# Patient Record
Sex: Female | Born: 1945
Health system: Southern US, Community
[De-identification: ages and names within clinical notes are randomized; demographics above are authoritative.]

## PROBLEM LIST (undated history)

## (undated) DIAGNOSIS — I959 Hypotension, unspecified: Secondary | ICD-10-CM

## (undated) DIAGNOSIS — F329 Major depressive disorder, single episode, unspecified: Secondary | ICD-10-CM

## (undated) DIAGNOSIS — R431 Parosmia: Secondary | ICD-10-CM

## (undated) DIAGNOSIS — G4730 Sleep apnea, unspecified: Secondary | ICD-10-CM

## (undated) DIAGNOSIS — M1990 Unspecified osteoarthritis, unspecified site: Secondary | ICD-10-CM

## (undated) DIAGNOSIS — T7840XA Allergy, unspecified, initial encounter: Secondary | ICD-10-CM

## (undated) DIAGNOSIS — H269 Unspecified cataract: Secondary | ICD-10-CM

## (undated) DIAGNOSIS — I82409 Acute embolism and thrombosis of unspecified deep veins of unspecified lower extremity: Secondary | ICD-10-CM

## (undated) DIAGNOSIS — T8859XA Other complications of anesthesia, initial encounter: Secondary | ICD-10-CM

## (undated) DIAGNOSIS — R569 Unspecified convulsions: Secondary | ICD-10-CM

## (undated) DIAGNOSIS — Z9989 Dependence on other enabling machines and devices: Secondary | ICD-10-CM

## (undated) DIAGNOSIS — E785 Hyperlipidemia, unspecified: Secondary | ICD-10-CM

## (undated) DIAGNOSIS — R0600 Dyspnea, unspecified: Secondary | ICD-10-CM

## (undated) DIAGNOSIS — B029 Zoster without complications: Secondary | ICD-10-CM

## (undated) DIAGNOSIS — F32A Depression, unspecified: Secondary | ICD-10-CM

## (undated) DIAGNOSIS — G4710 Hypersomnia, unspecified: Secondary | ICD-10-CM

## (undated) DIAGNOSIS — F419 Anxiety disorder, unspecified: Secondary | ICD-10-CM

## (undated) DIAGNOSIS — R51 Headache: Secondary | ICD-10-CM

## (undated) DIAGNOSIS — G4733 Obstructive sleep apnea (adult) (pediatric): Secondary | ICD-10-CM

## (undated) HISTORY — PX: TONSILLECTOMY: SUR1361

## (undated) HISTORY — DX: Hypersomnia, unspecified: G47.10

## (undated) HISTORY — DX: Anxiety disorder, unspecified: F41.9

## (undated) HISTORY — DX: Headache: R51

## (undated) HISTORY — DX: Allergy, unspecified, initial encounter: T78.40XA

## (undated) HISTORY — DX: Zoster without complications: B02.9

## (undated) HISTORY — DX: Obstructive sleep apnea (adult) (pediatric): Z99.89

## (undated) HISTORY — DX: Depression, unspecified: F32.A

## (undated) HISTORY — DX: Obstructive sleep apnea (adult) (pediatric): G47.33

## (undated) HISTORY — DX: Major depressive disorder, single episode, unspecified: F32.9

## (undated) HISTORY — PX: OTHER SURGICAL HISTORY: SHX169

## (undated) HISTORY — DX: Hyperlipidemia, unspecified: E78.5

## (undated) HISTORY — DX: Sleep apnea, unspecified: G47.30

## (undated) HISTORY — PX: CHOLECYSTECTOMY: SHX55

## (undated) HISTORY — PX: KNEE SURGERY: SHX244

## (undated) HISTORY — PX: APPENDECTOMY: SHX54

## (undated) HISTORY — DX: Unspecified convulsions: R56.9

## (undated) HISTORY — DX: Unspecified cataract: H26.9

---

## 1996-11-08 HISTORY — PX: BREAST EXCISIONAL BIOPSY: SUR124

## 1999-06-05 ENCOUNTER — Encounter: Payer: Self-pay | Admitting: Internal Medicine

## 1999-06-05 ENCOUNTER — Ambulatory Visit (HOSPITAL_COMMUNITY): Admission: RE | Admit: 1999-06-05 | Discharge: 1999-06-05 | Payer: Self-pay | Admitting: Internal Medicine

## 1999-12-04 ENCOUNTER — Other Ambulatory Visit: Admission: RE | Admit: 1999-12-04 | Discharge: 1999-12-04 | Payer: Self-pay | Admitting: Obstetrics and Gynecology

## 2000-11-15 ENCOUNTER — Other Ambulatory Visit: Admission: RE | Admit: 2000-11-15 | Discharge: 2000-11-15 | Payer: Self-pay | Admitting: Obstetrics and Gynecology

## 2003-04-04 ENCOUNTER — Encounter: Payer: Self-pay | Admitting: Internal Medicine

## 2003-04-04 ENCOUNTER — Encounter: Admission: RE | Admit: 2003-04-04 | Discharge: 2003-04-04 | Payer: Self-pay | Admitting: Internal Medicine

## 2003-06-03 ENCOUNTER — Observation Stay (HOSPITAL_COMMUNITY): Admission: AD | Admit: 2003-06-03 | Discharge: 2003-06-04 | Payer: Self-pay | Admitting: Internal Medicine

## 2003-06-18 ENCOUNTER — Inpatient Hospital Stay (HOSPITAL_COMMUNITY): Admission: EM | Admit: 2003-06-18 | Discharge: 2003-06-21 | Payer: Self-pay | Admitting: Emergency Medicine

## 2003-06-18 ENCOUNTER — Encounter: Payer: Self-pay | Admitting: *Deleted

## 2003-10-15 ENCOUNTER — Encounter: Admission: RE | Admit: 2003-10-15 | Discharge: 2003-10-15 | Payer: Self-pay | Admitting: Internal Medicine

## 2003-10-17 ENCOUNTER — Other Ambulatory Visit: Admission: RE | Admit: 2003-10-17 | Discharge: 2003-10-17 | Payer: Self-pay | Admitting: Family Medicine

## 2004-10-22 ENCOUNTER — Ambulatory Visit: Payer: Self-pay | Admitting: Internal Medicine

## 2004-10-27 ENCOUNTER — Ambulatory Visit: Payer: Self-pay | Admitting: Internal Medicine

## 2004-10-27 ENCOUNTER — Inpatient Hospital Stay (HOSPITAL_COMMUNITY): Admission: AD | Admit: 2004-10-27 | Discharge: 2004-10-29 | Payer: Self-pay | Admitting: Internal Medicine

## 2004-11-08 HISTORY — PX: COLONOSCOPY: SHX174

## 2004-11-18 ENCOUNTER — Ambulatory Visit: Payer: Self-pay | Admitting: Internal Medicine

## 2004-11-27 ENCOUNTER — Encounter: Admission: RE | Admit: 2004-11-27 | Discharge: 2004-11-27 | Payer: Self-pay | Admitting: Internal Medicine

## 2004-12-29 ENCOUNTER — Emergency Department (HOSPITAL_COMMUNITY): Admission: EM | Admit: 2004-12-29 | Discharge: 2004-12-29 | Payer: Self-pay | Admitting: Emergency Medicine

## 2004-12-31 ENCOUNTER — Ambulatory Visit: Payer: Self-pay | Admitting: Internal Medicine

## 2005-01-01 ENCOUNTER — Encounter: Admission: RE | Admit: 2005-01-01 | Discharge: 2005-01-01 | Payer: Self-pay | Admitting: Internal Medicine

## 2005-01-03 ENCOUNTER — Encounter: Admission: RE | Admit: 2005-01-03 | Discharge: 2005-01-03 | Payer: Self-pay | Admitting: Internal Medicine

## 2005-01-07 ENCOUNTER — Ambulatory Visit: Payer: Self-pay | Admitting: Cardiology

## 2005-01-11 ENCOUNTER — Ambulatory Visit: Payer: Self-pay | Admitting: Cardiology

## 2005-03-01 ENCOUNTER — Ambulatory Visit: Payer: Self-pay | Admitting: Internal Medicine

## 2005-03-08 ENCOUNTER — Ambulatory Visit: Payer: Self-pay | Admitting: Cardiology

## 2005-03-30 ENCOUNTER — Ambulatory Visit: Payer: Self-pay | Admitting: Internal Medicine

## 2005-03-30 ENCOUNTER — Inpatient Hospital Stay (HOSPITAL_COMMUNITY): Admission: AD | Admit: 2005-03-30 | Discharge: 2005-04-01 | Payer: Self-pay | Admitting: Internal Medicine

## 2005-04-09 ENCOUNTER — Ambulatory Visit: Payer: Self-pay | Admitting: Internal Medicine

## 2005-06-03 ENCOUNTER — Ambulatory Visit: Payer: Self-pay | Admitting: Internal Medicine

## 2005-07-01 ENCOUNTER — Ambulatory Visit: Payer: Self-pay | Admitting: Cardiology

## 2005-07-01 ENCOUNTER — Ambulatory Visit: Payer: Self-pay

## 2005-10-15 ENCOUNTER — Ambulatory Visit: Payer: Self-pay | Admitting: Internal Medicine

## 2005-12-02 ENCOUNTER — Ambulatory Visit: Payer: Self-pay | Admitting: Internal Medicine

## 2005-12-02 ENCOUNTER — Encounter: Admission: RE | Admit: 2005-12-02 | Discharge: 2005-12-02 | Payer: Self-pay | Admitting: Internal Medicine

## 2005-12-06 ENCOUNTER — Ambulatory Visit: Payer: Self-pay | Admitting: Internal Medicine

## 2005-12-20 ENCOUNTER — Ambulatory Visit: Payer: Self-pay | Admitting: *Deleted

## 2005-12-27 ENCOUNTER — Ambulatory Visit: Payer: Self-pay | Admitting: *Deleted

## 2006-02-28 ENCOUNTER — Ambulatory Visit: Payer: Self-pay | Admitting: Internal Medicine

## 2006-03-10 ENCOUNTER — Ambulatory Visit: Payer: Self-pay | Admitting: Internal Medicine

## 2006-08-18 ENCOUNTER — Ambulatory Visit: Payer: Self-pay | Admitting: Internal Medicine

## 2006-08-25 ENCOUNTER — Ambulatory Visit: Payer: Self-pay | Admitting: Internal Medicine

## 2006-09-12 ENCOUNTER — Ambulatory Visit: Payer: Self-pay | Admitting: Internal Medicine

## 2006-09-14 ENCOUNTER — Ambulatory Visit: Payer: Self-pay | Admitting: Internal Medicine

## 2006-12-15 ENCOUNTER — Ambulatory Visit: Payer: Self-pay | Admitting: Internal Medicine

## 2006-12-30 ENCOUNTER — Encounter: Admission: RE | Admit: 2006-12-30 | Discharge: 2006-12-30 | Payer: Self-pay | Admitting: Internal Medicine

## 2007-03-20 ENCOUNTER — Ambulatory Visit: Payer: Self-pay | Admitting: Internal Medicine

## 2007-03-20 LAB — CONVERTED CEMR LAB
AST: 16 units/L (ref 0–37)
Cholesterol: 220 mg/dL (ref 0–200)
Direct LDL: 148.8 mg/dL

## 2007-03-23 DIAGNOSIS — T7840XA Allergy, unspecified, initial encounter: Secondary | ICD-10-CM | POA: Insufficient documentation

## 2007-03-24 ENCOUNTER — Encounter: Payer: Self-pay | Admitting: Internal Medicine

## 2007-03-24 ENCOUNTER — Ambulatory Visit: Payer: Self-pay | Admitting: Internal Medicine

## 2007-06-22 ENCOUNTER — Ambulatory Visit: Payer: Self-pay | Admitting: Internal Medicine

## 2007-06-22 LAB — CONVERTED CEMR LAB
ALT: 22 units/L (ref 0–35)
BUN: 18 mg/dL (ref 6–23)
Creatinine, Ser: 0.8 mg/dL (ref 0.4–1.2)
HDL: 45.1 mg/dL (ref >39.0)
Total CK: 47 units/L (ref 7–177)
Triglycerides: 132 mg/dL (ref 0–149)

## 2007-06-29 ENCOUNTER — Ambulatory Visit: Payer: Self-pay | Admitting: Internal Medicine

## 2007-06-29 DIAGNOSIS — E785 Hyperlipidemia, unspecified: Secondary | ICD-10-CM | POA: Insufficient documentation

## 2007-06-29 DIAGNOSIS — T887XXA Unspecified adverse effect of drug or medicament, initial encounter: Secondary | ICD-10-CM

## 2007-06-29 DIAGNOSIS — E8881 Metabolic syndrome: Secondary | ICD-10-CM

## 2007-06-29 LAB — CONVERTED CEMR LAB: Cholesterol, target level: 200 mg/dL

## 2007-06-30 ENCOUNTER — Telehealth (INDEPENDENT_AMBULATORY_CARE_PROVIDER_SITE_OTHER): Payer: Self-pay | Admitting: *Deleted

## 2007-08-08 ENCOUNTER — Telehealth (INDEPENDENT_AMBULATORY_CARE_PROVIDER_SITE_OTHER): Payer: Self-pay | Admitting: *Deleted

## 2007-09-27 ENCOUNTER — Ambulatory Visit: Payer: Self-pay | Admitting: Internal Medicine

## 2008-01-03 ENCOUNTER — Ambulatory Visit: Payer: Self-pay | Admitting: Internal Medicine

## 2008-01-10 ENCOUNTER — Telehealth: Payer: Self-pay | Admitting: Internal Medicine

## 2008-01-10 ENCOUNTER — Encounter (INDEPENDENT_AMBULATORY_CARE_PROVIDER_SITE_OTHER): Payer: Self-pay | Admitting: *Deleted

## 2008-07-11 ENCOUNTER — Encounter: Admission: RE | Admit: 2008-07-11 | Discharge: 2008-07-11 | Payer: Self-pay | Admitting: Internal Medicine

## 2008-07-29 ENCOUNTER — Telehealth (INDEPENDENT_AMBULATORY_CARE_PROVIDER_SITE_OTHER): Payer: Self-pay | Admitting: *Deleted

## 2008-08-01 ENCOUNTER — Telehealth (INDEPENDENT_AMBULATORY_CARE_PROVIDER_SITE_OTHER): Payer: Self-pay | Admitting: *Deleted

## 2008-08-21 ENCOUNTER — Ambulatory Visit: Payer: Self-pay | Admitting: Internal Medicine

## 2008-10-08 ENCOUNTER — Encounter: Payer: Self-pay | Admitting: Internal Medicine

## 2009-03-11 ENCOUNTER — Telehealth (INDEPENDENT_AMBULATORY_CARE_PROVIDER_SITE_OTHER): Payer: Self-pay | Admitting: *Deleted

## 2009-03-26 ENCOUNTER — Encounter: Payer: Self-pay | Admitting: Internal Medicine

## 2009-04-04 ENCOUNTER — Ambulatory Visit: Payer: Self-pay | Admitting: Internal Medicine

## 2009-04-04 DIAGNOSIS — J45909 Unspecified asthma, uncomplicated: Secondary | ICD-10-CM

## 2009-04-04 DIAGNOSIS — J309 Allergic rhinitis, unspecified: Secondary | ICD-10-CM

## 2009-04-04 DIAGNOSIS — E1142 Type 2 diabetes mellitus with diabetic polyneuropathy: Secondary | ICD-10-CM

## 2009-04-04 DIAGNOSIS — K5730 Diverticulosis of large intestine without perforation or abscess without bleeding: Secondary | ICD-10-CM

## 2009-04-04 HISTORY — DX: Type 2 diabetes mellitus with diabetic polyneuropathy: E11.42

## 2009-04-04 HISTORY — DX: Allergic rhinitis, unspecified: J30.9

## 2009-04-04 HISTORY — DX: Unspecified asthma, uncomplicated: J45.909

## 2009-04-04 HISTORY — DX: Diverticulosis of large intestine without perforation or abscess without bleeding: K57.30

## 2009-05-14 ENCOUNTER — Ambulatory Visit: Payer: Self-pay | Admitting: Internal Medicine

## 2009-05-14 DIAGNOSIS — S90919A Unspecified superficial injury of unspecified ankle, initial encounter: Secondary | ICD-10-CM

## 2009-05-14 DIAGNOSIS — S70919A Unspecified superficial injury of unspecified hip, initial encounter: Secondary | ICD-10-CM | POA: Insufficient documentation

## 2009-05-14 DIAGNOSIS — S70929A Unspecified superficial injury of unspecified thigh, initial encounter: Secondary | ICD-10-CM

## 2009-05-14 DIAGNOSIS — S0180XA Unspecified open wound of other part of head, initial encounter: Secondary | ICD-10-CM | POA: Insufficient documentation

## 2009-05-14 DIAGNOSIS — S80929A Unspecified superficial injury of unspecified lower leg, initial encounter: Secondary | ICD-10-CM

## 2009-07-31 ENCOUNTER — Encounter: Admission: RE | Admit: 2009-07-31 | Discharge: 2009-07-31 | Payer: Self-pay | Admitting: Internal Medicine

## 2009-09-04 ENCOUNTER — Telehealth (INDEPENDENT_AMBULATORY_CARE_PROVIDER_SITE_OTHER): Payer: Self-pay | Admitting: *Deleted

## 2009-09-17 ENCOUNTER — Ambulatory Visit: Payer: Self-pay | Admitting: Internal Medicine

## 2009-11-05 ENCOUNTER — Telehealth: Payer: Self-pay | Admitting: Internal Medicine

## 2009-11-06 ENCOUNTER — Ambulatory Visit: Payer: Self-pay | Admitting: Family Medicine

## 2009-11-06 DIAGNOSIS — H8110 Benign paroxysmal vertigo, unspecified ear: Secondary | ICD-10-CM

## 2009-11-06 DIAGNOSIS — H9209 Otalgia, unspecified ear: Secondary | ICD-10-CM | POA: Insufficient documentation

## 2009-11-06 HISTORY — DX: Benign paroxysmal vertigo, unspecified ear: H81.10

## 2010-02-13 ENCOUNTER — Ambulatory Visit: Payer: Self-pay | Admitting: Internal Medicine

## 2010-02-13 DIAGNOSIS — R079 Chest pain, unspecified: Secondary | ICD-10-CM

## 2010-02-16 LAB — CONVERTED CEMR LAB
ALT: 24 units/L (ref 0–35)
AST: 19 units/L (ref 0–37)
Albumin: 4 g/dL (ref 3.5–5.2)
Alkaline Phosphatase: 62 units/L (ref 39–117)
Basophils Relative: 0.3 % (ref 0.0–3.0)
Bilirubin, Direct: 0.1 mg/dL (ref 0.0–0.3)
CO2: 31 meq/L (ref 19–32)
Calcium: 9.1 mg/dL (ref 8.4–10.5)
Direct LDL: 156.2 mg/dL
Eosinophils Relative: 3.3 % (ref 0.0–5.0)
GFR calc non Af Amer: 88.94 mL/min (ref >60)
Hemoglobin: 14.7 g/dL (ref 12.0–15.0)
Lymphocytes Relative: 29 % (ref 12.0–46.0)
MCHC: 34.1 g/dL (ref 30.0–36.0)
Monocytes Relative: 7.5 % (ref 3.0–12.0)
Neutro Abs: 5.7 10*3/uL (ref 1.4–7.7)
Neutrophils Relative %: 59.9 % (ref 43.0–77.0)
RBC: 4.68 M/uL (ref 3.87–5.11)
Sodium: 142 meq/L (ref 135–145)
Total CHOL/HDL Ratio: 4
Total Protein: 6.9 g/dL (ref 6.0–8.3)
Triglycerides: 142 mg/dL (ref 0.0–149.0)
VLDL: 28.4 mg/dL (ref 0.0–40.0)
WBC: 9.4 10*3/uL (ref 4.5–10.5)

## 2010-03-05 ENCOUNTER — Telehealth (INDEPENDENT_AMBULATORY_CARE_PROVIDER_SITE_OTHER): Payer: Self-pay | Admitting: *Deleted

## 2010-04-03 ENCOUNTER — Ambulatory Visit: Payer: Self-pay | Admitting: Internal Medicine

## 2010-04-03 DIAGNOSIS — M7660 Achilles tendinitis, unspecified leg: Secondary | ICD-10-CM | POA: Insufficient documentation

## 2010-04-03 DIAGNOSIS — M25579 Pain in unspecified ankle and joints of unspecified foot: Secondary | ICD-10-CM

## 2010-04-16 ENCOUNTER — Ambulatory Visit: Payer: Self-pay | Admitting: Internal Medicine

## 2010-04-16 DIAGNOSIS — J019 Acute sinusitis, unspecified: Secondary | ICD-10-CM | POA: Insufficient documentation

## 2010-04-28 ENCOUNTER — Encounter: Payer: Self-pay | Admitting: Internal Medicine

## 2010-09-02 ENCOUNTER — Encounter: Admission: RE | Admit: 2010-09-02 | Discharge: 2010-09-02 | Payer: Self-pay | Admitting: Internal Medicine

## 2010-09-02 LAB — HM MAMMOGRAPHY: HM Mammogram: NORMAL

## 2010-11-29 ENCOUNTER — Encounter: Payer: Self-pay | Admitting: Internal Medicine

## 2010-12-08 NOTE — Assessment & Plan Note (Signed)
Summary: cough.cbs   Vital Signs:  Patient profile:   65 year old female Weight:      239 pounds O2 Sat:      97 % Temp:     98.8 degrees F oral Pulse rate:   80 / minute Resp:     14 per minute BP sitting:   116 / 68  (left arm) Cuff size:   large  Vitals Entered By: Shonna Chock (April 16, 2010 1:40 PM) CC: Cough x 6 days, Patient took: Contac (Flu + Cold) Max Strength, Guaifensin 400mg , and Benabryl Comments REVIEWED MED LIST, PATIENT AGREED DOSE AND INSTRUCTION CORRECT    Primary Care Provider:  Nani Gasser, MD  CC:  Cough x 6 days, Patient took: Contac (Flu + Cold) Max Strength, Guaifensin 400mg , and and Benabryl.  History of Present Illness: Cough for 6 days; OTC meds(list reviewed) with minimal benefit. Yellow green from head & chest, > from head.PMH of asthma. Using MDI up to 1 puff  bid given to her by her daughter who has asthma. Her husband had RTI during same period.  Allergies: 1)  ! Pcn 2)  ! * Shellfish 3)  ! Biaxin 4)  ! * Norflex 5)  ! * Advair 6)  ! * Singulair 7)  ! Sulfa 8)  ! * Avelox 9)  ! * Ivp Dye 10)  ! Tylenol Extra Strength (Acetaminophen) 11)  * Fiorinol  Review of Systems General:  Denies chills, fever, and sweats. ENT:  Complains of nasal congestion, postnasal drainage, and sinus pressure; No frontal headache or facial pain. Resp:  Complains of shortness of breath and wheezing; denies chest pain with inspiration; Scant streaks of blood. Allergy:  Complains of itching eyes and sneezing.  Physical Exam  General:  in no acute distress; alert,appropriate and cooperative throughout examination Ears:  External ear exam shows no significant lesions or deformities.  Otoscopic examination reveals clear canals, tympanic membranes are intact bilaterally without bulging, retraction, inflammation or discharge. Hearing is grossly normal bilaterally. MINIMNAL erythema R TM Nose:  External nasal examination shows no deformity or inflammation. Nasal  mucosa are dry without lesions or exudates. Mouth:  Oral mucosa and oropharynx without lesions or exudates.  Teeth in good repair. Lungs:  Normal respiratory effort, chest expands symmetrically. Lungs are clear to auscultation, no crackles or wheezes but paroxysmal cough.. Cervical Nodes:  No lymphadenopathy noted Axillary Nodes:  No palpable lymphadenopathy   Impression & Recommendations:  Problem # 1:  SINUSITIS- ACUTE-NOS (ICD-461.9)  Her updated medication list for this problem includes:    Azithromycin 250 Mg Tabs (Azithromycin) .Marland Kitchen... As per pack  Problem # 2:  BRONCHITIS-ACUTE (ICD-466.0)  Clinically no significant  RAD  @ present despite PMH Her updated medication list for this problem includes:    Azithromycin 250 Mg Tabs (Azithromycin) .Marland Kitchen... As per pack    Advair Diskus 250-50 Mcg/dose Aepb (Fluticasone-salmeterol) .Marland Kitchen... 1 inhalation every 12 hrs ; gargle & sopit after use    Singulair 10 Mg Tabs (Montelukast sodium) .Marland Kitchen... 1 once daily  Orders: T-2 View CXR (71020TC)  Complete Medication List: 1)  Budeprion Sr 150 Mg Tb12 (Bupropion hcl) .Marland Kitchen.. 1 by mouth bid 2)  Multivitamins Tabs (Multiple vitamin) .... Once daily 3)  Alli  .Marland Kitchen.. 1 by mouth qd 4)  Janumet 50-500 Mg Tabs (Sitagliptin-metformin hcl) .... 1/2 by mouth once daily 5)  Crestor 10 Mg Tabs (rosuvastatin Calcium)  .Marland Kitchen.. 1 by mouth once daily 6)  Flax Seed Oil  1000 Mg Caps (Flaxseed (linseed)) .Marland Kitchen.. 1 by mouth once daily 7)  Vitamin D3 2000 Unit Caps (Cholecalciferol) .Marland Kitchen.. 1 by mouth once daily 8)  Claritin 10 Mg Tabs (Loratadine) .... Once daily 9)  Aspirin 81 Mg Tabs (Aspirin) .Marland Kitchen.. 1 by mouth once daily 10)  Tramadol Hcl 50 Mg Tabs (Tramadol hcl) .Marland Kitchen.. 1 every 6 hrs as needed pain 11)  Azithromycin 250 Mg Tabs (Azithromycin) .... As per pack 12)  Advair Diskus 250-50 Mcg/dose Aepb (Fluticasone-salmeterol) .Marland Kitchen.. 1 inhalation every 12 hrs ; gargle & sopit after use 13)  Singulair 10 Mg Tabs (Montelukast sodium) .Marland Kitchen.. 1  once daily  Patient Instructions: 1)  Use Ventolin HFA 1-2 puffs every 4 hrs as needed .Drink as much fluid as you can tolerate for the next few days. Prescriptions: SINGULAIR 10 MG TABS (MONTELUKAST SODIUM) 1 once daily  #30 x 5   Entered and Authorized by:   Marga Melnick MD   Signed by:   Marga Melnick MD on 04/16/2010   Method used:   Samples Given   RxID:   [id number] ADVAIR DISKUS 250-50 MCG/DOSE AEPB (FLUTICASONE-SALMETEROL) 1 inhalation every 12 hrs ; gargle & sopit after use  #1 x 0   Entered and Authorized by:   Marga Melnick MD   Signed by:   Marga Melnick MD on 04/16/2010   Method used:   Samples Given   RxID:   [id number] AZITHROMYCIN 250 MG TABS (AZITHROMYCIN) as per pack  #1 x o   Entered and Authorized by:   Marga Melnick MD   Signed by:   Marga Melnick MD on 04/16/2010   Method used:   Faxed to ...       [facility] (retail)       [address]       [place], [place]  [address]       Ph: [phone]       Fax: [phone]   RxID:   [id number]

## 2010-12-08 NOTE — Consult Note (Signed)
Summary: [facility]  [facility]   Imported By: Lanelle Bal 07/21/2010 08:41:16  _____________________________________________________________________  External Attachment:    Type:   Image     Comment:   External Document

## 2010-12-08 NOTE — Progress Notes (Signed)
Summary: Refill Request  Phone Note Refill Request Call back at [phone] juan Message from:  Patient  Refills Requested: Medication #1:  BUDEPRION SR 150 MG  TB12 1 by mouth bid patient wants rx for 90 day supply he is going to [facility] - call patient husband when ready  Initial call taken by: Okey Regal [place],  March 05, 2010 11:22 AM  Follow-up for Phone Call        Patient's husband is aware rx is ready for pick-up, I informed him Dr.Hopper is out of the office and I will have Dr.Lowne sign the rx, ok'd Follow-up by: Shonna Chock,  March 05, 2010 3:21 PM    Prescriptions: BUDEPRION SR 150 MG  TB12 (BUPROPION HCL) 1 by mouth bid  #180 x 3   Entered by:   Shonna Chock   Authorized by:   Loreen Freud DO   Signed by:   Shonna Chock on 03/05/2010   Method used:   Print then Give to Patient   RxID:   [id number]

## 2010-12-08 NOTE — Assessment & Plan Note (Signed)
Summary: ROA//[facility]///[facility]   Vital Signs:  Patient profile:   65 year old female Weight:      240 pounds Temp:     98.5 degrees F oral Pulse rate:   72 / minute Resp:     17 per minute BP sitting:   122 / 78  (left arm) Cuff size:   large  Vitals Entered By: Shonna Chock (Apr 03, 2010 3:55 PM) CC: Left Ankle concerns x 2-3 months, Lower Extremity Joint pain Comments REVIEWED MED LIST, PATIENT AGREED DOSE AND INSTRUCTION CORRECT    Primary Care Provider:  Nani Gasser, MD  CC:  Left Ankle concerns x 2-3 months and Lower Extremity Joint pain.  History of Present Illness:  Lower Extremity Joint Pain      This is a 65 year old woman who presents with Lower Extremity Joint pain for 2-3  months  intermittently.  She has had swelling in that ankle for years.  The patient reports swelling, stiffness for >1 hr, and weakness, but denies redness, giving away, locking, popping, and decreased ROM.  The pain is located in the right ankle.  The pain began gradually .  The pain is described as sharp, intermittent, and activity/use  related.  Evaluation to date has included plain X-rays & injection by a Podiatrist with temporary improvement.  NSAIDS also help for a period. The patient denies the following symptoms: fever, rash, photosensitivity, eye symptoms, diarrhea, and dysuria.    Allergies: 1)  ! Pcn 2)  ! * Shellfish 3)  ! Biaxin 4)  ! * Norflex 5)  ! * Advair 6)  ! * Singulair 7)  ! Sulfa 8)  ! * Avelox 9)  ! * Ivp Dye 10)  ! Tylenol Extra Strength (Acetaminophen) 11)  * Fiorinol  Review of Systems MS:  Complains of muscle aches; denies low back pain, mid back pain, cramps, and thoracic pain. Derm:  Denies lesion(s) and rash.  Physical Exam  General:  in no acute distress; alert,appropriate and cooperative throughout examination Eyes:  No corneal or conjunctival inflammation noted.PERRL Mouth:  Oral mucosa and oropharynx without lesions or exudates.  Teeth in good  repair.Upper partial  Pulses:  R and L dorsalis pedis and posterior tibial pulses are full and equal bilaterally Extremities:  No clubbing, cyanosis, edema, or deformity noted with normal full range of motion of all joints.  Subcutaneous  tissues inferior to  L lateral malleolus  are increased with slight tenderness  to palpation . Pain L Achilles tendon with dorsiflexion L foot. Negative Homan's Neurologic:  alert & oriented X3, strength normal in all extremities, gait  abnormal with , and DTRs symmetrical and normal.   Skin:  Scattered stasis dermatitis over legs  Cervical Nodes:  No lymphadenopathy noted Axillary Nodes:  No palpable lymphadenopathy Psych:  memory intact for recent and remote, normally interactive, and good eye contact.     Impression & Recommendations:  Problem # 1:  ANKLE PAIN, LEFT (ICD-719.47)  Soft tissue , not bone   Orders: Orthopedic Referral (Ortho)  Problem # 2:  ACHILLES TENDINITIS (ICD-726.71)  Orders: Orthopedic Referral (Ortho)  Complete Medication List: 1)  Budeprion Sr 150 Mg Tb12 (Bupropion hcl) .Marland Kitchen.. 1 by mouth bid 2)  Multivitamins Tabs (Multiple vitamin) .... Once daily 3)  Alli  .Marland Kitchen.. 1 by mouth qd 4)  Janumet 50-500 Mg Tabs (Sitagliptin-metformin hcl) .... 1/2 by mouth once daily 5)  Crestor 10 Mg Tabs (rosuvastatin Calcium)  .Marland Kitchen.. 1 by mouth once  daily 6)  Flax Seed Oil 1000 Mg Caps (Flaxseed (linseed)) .Marland Kitchen.. 1 by mouth once daily 7)  Vitamin D3 2000 Unit Caps (Cholecalciferol) .Marland Kitchen.. 1 by mouth once daily 8)  Claritin 10 Mg Tabs (Loratadine) .... Once daily 9)  Aspirin 81 Mg Tabs (Aspirin) .Marland Kitchen.. 1 by mouth once daily 10)  Tramadol Hcl 50 Mg Tabs (Tramadol hcl) .Marland Kitchen.. 1 every 6 hrs as needed pain  Patient Instructions: 1)  Tramadol 50 mg every 6 hrs as needed for pain. Do not take Glucosamine because of your shellfish allergy Prescriptions: TRAMADOL HCL 50 MG TABS (TRAMADOL HCL) 1 every 6 hrs as needed pain  #30 x 1   Entered and Authorized by:    Marga Melnick MD   Signed by:   Marga Melnick MD on 04/03/2010   Method used:   Print then Give to Patient   RxID:   [id number]

## 2010-12-08 NOTE — Assessment & Plan Note (Signed)
Summary: emp//pt will be fasting//lch   Vital Signs:  Patient profile:   65 year old female Height:      59 inches Weight:      243.4 pounds BMI:     49.34 O2 Sat:      97 % Temp:     98.8 degrees F oral Pulse rate:   72 / minute Resp:     16 per minute BP sitting:   122 / 80  (left arm) Cuff size:   large  Vitals Entered By: Shonna Chock (February 13, 2010 11:17 AM) CC: 1.) Yearly follow-up and fasting labs 2.)Lung concerns-left side pain x a couple of weeks Comments REVIEWED MED LIST, PATIENT AGREED DOSE AND INSTRUCTION CORRECT    Primary Care Provider:  Nani Gasser, MD  CC:  1.) Yearly follow-up and fasting labs 2.)Lung concerns-left side pain x a couple of weeks.  History of Present Illness: [place] is here for  a physical;preventive health measures reviewed & are  up to date. She has an acute issue of chest pain  intermittently  X 2 weeks ,w/o trigger.  The patient reports resting chest pain, shortness of breath, and dizziness, but denies nausea, vomiting, diaphoresis, light headedness, syncope, and indigestion.  The pain is described as intermittent and sharp.  The pain is located in the left posterior/inferior  chest and the pain does not radiate.  Episodes of chest pain last 2-5 minutes.  The pain is brought on or made worse by deep breathing and lying down.  The pain is relieved or improved with rest/ meditation  and  NSAIDs.Last episode was 02/12/2010.                                                                                               Diabetes  update:FBS 97-122; no 2 hr post meal checks..No hypoglycemia. Weight up 10#.Dr Margaretmary Bayley last seen late Fall 2010.   Allergies: 1)  ! Pcn 2)  ! * Shellfish 3)  ! Biaxin 4)  ! * Norflex 5)  ! * Advair 6)  ! * Singulair 7)  ! Sulfa 8)  ! * Avelox 9)  ! * Ivp Dye 10)  ! Tylenol Extra Strength (Acetaminophen) 11)  * Fiorinol  Past History:  Past Medical History: Hyperlipidemia Allergic rhinitis Diabetes  mellitus, type II Asthma Diverticulosis @ Colonoscopy  Past Surgical History: Catheterization 2004: negative ; Status Asthmaticus 2005; PNA (CAP) 2006 ; G4 P2 Appendectomy Cholecystectomy Tonsillectomy  Family History: M died of complications from burn Father:  stomach cancer ,CAD Siblings: sister  beast cancer ; P aunt DM; M aunt DM  Social History: Marital Status: Married Children: 2 Retired from [employer] Never Smoked Alcohol use-yes: wine 1 glass once daily  Regular exercise-no  Review of Systems General:  Complains of fatigue; denies chills, fever, and weight loss. Eyes:  Denies blurring, double vision, and vision loss-both eyes; Last exam 2010; no retinopathy. ENT:  Denies nasal congestion and sinus pressure; No purulence. CV:  Complains of difficulty breathing while lying down; denies leg cramps with exertion. Resp:  Denies cough, coughing up  blood, and sputum productive. Derm:  Denies poor wound healing. Neuro:  Complains of tingling; denies numbness; Tingling LUE. Endo:  Denies excessive hunger, excessive thirst, and excessive urination. Allergy:  Complains of itching eyes and sneezing; Claritin of benefit; off allergy shots.  Physical Exam  General:  in no acute distress; alert,appropriate and cooperative throughout examination; overweight-appearing.   Head:  Normocephalic and atraumatic without obvious abnormalities. No apparent alopecia or balding. Eyes:  No corneal or conjunctival inflammation noted.Perrla. Funduscopic exam benign, without hemorrhages, exudates or papilledema.  Ears:  External ear exam shows no significant lesions or deformities.  Otoscopic examination reveals clear canals, tympanic membranes are intact bilaterally without bulging, retraction, inflammation or discharge. Hearing is grossly normal bilaterally. Nose:  External nasal examination shows no deformity or inflammation. Nasal mucosa are pink and moist without lesions or exudates. Mouth:   Oral mucosa and oropharynx without lesions or exudates.  Upper partial Neck:  No deformities, masses, or tenderness noted. Lungs:  Normal respiratory effort, chest expands symmetrically. Lungs are clear to auscultation, no crackles or wheezes. No splinting; no pain to compression. Minor dry cough. Heart:  Normal rate and regular rhythm. S1 and S2 normal without gallop, murmur, click, rub .S4 Abdomen:  Bowel sounds positive,abdomen soft and non-tender without masses, organomegaly or hernias noted. Genitalia:  to see Gyn in [place] Msk:  No deformity or scoliosis noted of thoracic or lumbar spine.   Pulses:  R and L carotid,radial,dorsalis pedis and posterior tibial pulses are full and equal bilaterally Extremities:  No clubbing, cyanosis, edema. Neg Homan's. Good nail health Neurologic:  alert & oriented X3, sensation intact to light touch over feet, and DTRs symmetrical and normal.   Skin:  Intact without suspicious lesions or rashes Cervical Nodes:  No lymphadenopathy noted Axillary Nodes:  No palpable lymphadenopathy Psych:  memory intact for recent and remote, normally interactive, and good eye contact.     Impression & Recommendations:  Problem # 1:  PREVENTIVE HEALTH CARE (ICD-V70.0)  Orders: EKG w/ Interpretation ([address]) Venipuncture ([address]) TLB-Lipid Panel (80061-LIPID) TLB-BMP (Basic Metabolic Panel-BMET) (80048-METABOL) TLB-CBC Platelet - w/Differential (85025-CBCD) TLB-Hepatic/Liver Function Pnl (80076-HEPATIC) TLB-TSH (Thyroid Stimulating Hormone) (84443-TSH) TLB-A1C / Hgb A1C (Glycohemoglobin) (83036-A1C) TLB-Microalbumin/Creat Ratio, Urine (82043-MALB) T-2 View CXR (71020TC)  Problem # 2:  CHEST PAIN (ICD-786.50)  L posterior chest pain X 2 weeks  Orders: Venipuncture ([address]) T-2 View CXR (71020TC) T-D-Dimer Fibrin Derivatives Quantitive (09811-91478)  Problem # 3:  ASTHMA (ICD-493.90)  Stable  Orders: Venipuncture (29562)  Problem # 4:  DIABETES  MELLITUS, TYPE II (ICD-250.00)  Her updated medication list for this problem includes:    Janumet 50-500 Mg Tabs (Sitagliptin-metformin hcl) .Marland Kitchen... 1 by mouth once daily    Aspirin 81 Mg Tabs (Aspirin) .Marland Kitchen... 1 by mouth once daily  Orders: Venipuncture ([address]) TLB-BMP (Basic Metabolic Panel-BMET) (80048-METABOL) TLB-A1C / Hgb A1C (Glycohemoglobin) (83036-A1C) TLB-Microalbumin/Creat Ratio, Urine (82043-MALB)  Problem # 5:  HYPERLIPIDEMIA NEC/NOS (ICD-272.4)  Her updated medication list for this problem includes:    Crestor 5 Mg Tabs (Rosuvastatin calcium) .Marland Kitchen... 1 by mouth once daily  Orders: EKG w/ Interpretation (93000) Venipuncture (57846) TLB-Lipid Panel (80061-LIPID)  Complete Medication List: 1)  Budeprion Sr 150 Mg Tb12 (Bupropion hcl) .Marland Kitchen.. 1 by mouth bid 2)  Multivitamins Tabs (Multiple vitamin) .... Once daily 3)  Alli  .Marland Kitchen.. 1 by mouth qd 4)  Janumet 50-500 Mg Tabs (Sitagliptin-metformin hcl) .Marland Kitchen.. 1 by mouth once daily 5)  Crestor 5 Mg Tabs (Rosuvastatin calcium) .Marland Kitchen.. 1 by  mouth once daily 6)  Flax Seed Oil 1000 Mg Caps (Flaxseed (linseed)) .Marland Kitchen.. 1 by mouth once daily 7)  Vitamin D3 2000 Unit Caps (Cholecalciferol) .Marland Kitchen.. 1 by mouth once daily 8)  Claritin 10 Mg Tabs (Loratadine) .... Once daily 9)  Aspirin 81 Mg Tabs (Aspirin) .Marland Kitchen.. 1 by mouth once daily 10)  Tramadol Hcl 50 Mg Tabs (Tramadol hcl) .Marland Kitchen.. 1 every 6 hrs as needed pain 11)  Doxycycline Hyclate 100 Mg Caps (Doxycycline hyclate) .Marland Kitchen.. 1 two times a day x 2 days then 1 once daily  Patient Instructions: 1)  Consume LESS THAN 40 grams of sugar /day from High Fructose Corn  Syrup sources as discussed. Prescriptions: DOXYCYCLINE HYCLATE 100 MG CAPS (DOXYCYCLINE HYCLATE) 1 two times a day X 2 days then 1 once daily  #12 x 0   Entered and Authorized by:   Marga Melnick MD   Signed by:   Marga Melnick MD on 02/13/2010   Method used:   Faxed to ...       [facility] (retail)       [address]        [place], [place]  [address]       Ph: [phone]       Fax: [phone]   RxID:   [id number] TRAMADOL HCL 50 MG TABS (TRAMADOL HCL) 1 every 6 hrs as needed pain  #30 x 0   Entered and Authorized by:   Marga Melnick MD   Signed by:   Marga Melnick MD on 02/13/2010   Method used:   Faxed to ...       [facility] (retail)       [address]       [place], [place]  [address]       Ph: [phone]       Fax: [phone]   RxID:   [id number]
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
## 2011-03-26 NOTE — Discharge Summary (Signed)
NAME:  Nicole Mccann, Nicole Mccann NO.:  [id number]   MEDICAL RECORD NO.:  [id number]                   PATIENT TYPE:  INP   LOCATION:  3702                                 FACILITY:  [facility]   PHYSICIAN:  Cecil Cranker, M.D.             DATE OF BIRTH:  [birth year]   DATE OF ADMISSION:  06/18/2003  DATE OF DISCHARGE:  06/21/2003                           DISCHARGE SUMMARY - REFERRING   PROCEDURES:  1. Cardiac catheterization.  2. Coronary arteriogram.  3. Left ventriculogram.   HOSPITAL COURSE:  Ms. Keitt is a 65 year old female with no known  history of coronary artery disease.  She had a normal Persantine Cardiolite  in 2002 but saw her primary care physician for exertional chest pain and was  referred to cardiology.  She was evaluated in the office on June 18, 2003  and she had been having recurrent chest pressure associated with exertion.  Aspirin was added to her medication regimen but beta-blocker was deferred  secondary to a history of asthma.  It was felt that her symptoms were  consistent with unstable anginal pain and she was admitted from the office  for further evaluation and cardiac catheterization.   Ms. Hamler had a cardiac catheterization performed on June 20, 2003.  It showed normal coronary arteries and an EF of 61% without regional wall  motion abnormalities.  It was felt that she had a noncardiac etiology to her  chest pain.   Post procedure she complained of some anterior right lower extremity  numbness between the knee and the groin.  This gradually resolved but she  began complaining of right knee edema and some difficulty moving it.  This  is a knee which has given her problems secondary to arthritis for a long  period of time.  Her symptoms were not new or different and there was no  history of a fall or twisting motion.  Upon examination there was a moderate  amount of edema but no crepitus.  It was felt that observation  and pain  control was the best option.  She was kept overnight.   The next day her knee was much better and she had no chest pain or shortness  of breath.  The numbness in her leg had resolved and the edema in her knee  had also resolved.  She was ambulating without chest pain or shortness of  breath and her groin was without hematoma or ecchymosis.  She was considered  stable for discharge on June 21, 2003 with outpatient followup arranged.   LABORATORY VALUES:  Hemoglobin 14.2, hematocrit 42.0, WBCs 7.5, platelets  220.  Sodium 141, potassium 4.0, chloride 107, CO2 28, BUN 12, creatinine  0.8, glucose 111. Other C-MET values within normal limits.  Serial CK, MB  and troponin are negative for MI.  Total cholesterol 245, triglycerides 88,  HDL 55, LDL 172.  TSH 2.234.   DISCHARGE CONDITION:  Stable.  DISCHARGE DIAGNOSES:  1. Chest pain, no coronary artery disease by catheterization, followup with     the primary care physician and cardiology.  2. Diabetes.  Restart Glucophage on Sunday.  3. Mild hypertension.  Norvasc added to her medication regimen.  4. Asthma.  5. Osteoarthritis with right knee pain.  6. Transient numbness to the anterior aspect of her left thigh post     catheterization.  7. Hyperlipidemia.  8. Obesity.  9. Gastroesophageal reflux disease.   DISCHARGE INSTRUCTIONS:  1. Her activity level is to include no driving, sexual or strenuous activity     for two days.  2. She is to stick to a low-fat diabetic diet.  3. She is to call the office for problems with the cath site.  4. She is to followup with Dr. Alwyn Ren and call for an appointment.  5. She is to see the PA for Dr. Corinda Gubler on July 04, 2003 at 12:30.     Decision can be made at that time whether cardiology is to add statin to     her medication regimen or if this is to be left to her primary care     physician.   DISCHARGE MEDICATIONS:  1. Wellbutrin b.i.d.  2. Inhalers p.r.n.  3. Vitamins and  Benadryl as at home.  4. Metformin 500 mg, restart on June 23, 2003.  5. Norvasc 500 mg every day.  6. Singulair 10 mg every day.        Lavella Hammock, P.A. [facility]                  E. Graceann Congress, M.D.    RG/MEDQ  D:  06/21/2003  T:  06/21/2003  Job:  [id number]   cc:   Titus Dubin. Alwyn Ren, M.D. [facility]   Bea Laura Graceann Congress, M.D.

## 2011-03-26 NOTE — Discharge Summary (Signed)
NAME:  Nicole Mccann, Nicole Mccann NO.:  [id number]   MEDICAL RECORD NO.:  [id number]          PATIENT TYPE:  INP   LOCATION:  3714                         FACILITY:  [facility]   PHYSICIAN:  Rene Paci, M.D. LHCDATE OF BIRTH:  28-Jun-1946   DATE OF ADMISSION:  10/27/2004  DATE OF DISCHARGE:  10/29/2004                                 DISCHARGE SUMMARY   DIAGNOSES ON DISCHARGE:  1.  Acute exacerbation of asthma.  2.  Steroid-induced hyperglycemia.   BRIEF ADMISSION HISTORY:  Ms. Kimmet is a 65 year old female who  presented with status asthmaticus and left pleuritic chest pain.  The  patient was initially seen on October 22, 2004, with productive cough.  It  appears that she was started on Xopenex nebs, Azmacort MDI, and Levaquin 750  mg daily.  She called again on October 26, 2004, stating that she was not  any better.  At that time, prednisone was called in.  She was seen  emergently in the office on October 27, 2004, and Dr. Alwyn Ren felt that she  was probably in status asthmaticus.   PAST MEDICAL HISTORY:  1.  History of cardiac catheterization in 2004, revealing normal coronary      arteries with preserved EF and no wall motion abnormalities.  2.  Status post appendectomy.  3.  Status post cholecystectomy.  4.  Tonsillectomy.  5.  Asthma.  6.  Adult-onset diabetes mellitus.  7.  Dyslipidemia.  8.  Depression.   HOSPITAL COURSE:  Problem 1. Pulmonary.  The patient was admitted with  status asthmaticus and acute exacerbation of asthma.  She was admitted to  rule out pulmonary embolus.  CT of the chest showed no PE and some mild  bronchitic changes.  The patient was treated with oxygen.  The patient was  continued on nebulizers, steroids, and antibiotics.  The patient's condition  improved.  She was tapered back to oral antibiotics and oral steroids.  We  will have her complete a full 10-day course of antibiotics of which she has  already completed eight.  We  will also have her continue a steroid taper for  another week.   Problem 2. Hyperglycemia exacerbated by steroids.  The patient had a normal  hemoglobin A1c during this admission.   LABORATORIES ON DISCHARGE:  BNP was normal.  D-dimer 0.22.  White count  11.2, hemoglobin 14.9, hemoglobin A1c 5.9%.   DISCHARGE MEDICATIONS:  1.  Niacin 250 mg daily.  2.  Wellbutrin 300 mg daily.  3.  Levaquin 750 mg for two additional days.  4.  Prednisone 20 mg taper.  5.  Xopenex 1.25 mg nebulized q.6h. p.r.n.  6.  Glucophage 500 mg daily.   FOLLOWUP:  Follow up with Dr. Alwyn Ren.      Laur   LC/MEDQ  D:  10/29/2004  T:  10/30/2004  Job:  [id number]

## 2011-03-26 NOTE — H&P (Signed)
NAME:  Nicole Mccann, Nicole Mccann NO.:  [id number]   MEDICAL RECORD NO.:  [id number]          PATIENT TYPE:  INP   LOCATION:                               FACILITY:  [facility]   PHYSICIAN:  Titus Dubin. Alwyn Ren, M.D. [facility] OF BIRTH:  1946-01-08   DATE OF ADMISSION:  03/30/2005  DATE OF DISCHARGE:                                HISTORY & PHYSICAL   HISTORY OF PRESENT ILLNESS:  Nicole Mccann is a 65 year old white female  with extrinsic asthma, admitted with fever, shortness of breath, pleuritic  chest pain and radiographic suggestion of left lower lobe pneumonia.   She had a flare of her allergies over the weekend, Mar 27, 2005 and Mar 28, 2005.  On Mar 29, 2005, her temperature went to 100.4 and she developed a  cough with pleuritic pain.  She also had some associated dizziness and  weakness with scant hemoptysis.  She treated the allergic symptoms with  Benadryl with only partial resolution.  She began using her Ventolin metered-  dose inhaler up to 4 times a day.   In the office, when today seen emergently, she had wheezing and pleurisy.  Chest x-ray suggested possible pneumonia, prompting admission.   Her most recent hospitalization was in December 2005 for status asthmaticus.  She was seen in the emergency room in February of 2006 with vertigo.   PAST MEDICAL HISTORY:  Her past medical history includes.  1.  Four pregnancies and 2 deliveries.  2.  Appendectomy.  3.  Cholecystectomy.  4.  Tonsillectomy.   FAMILY HISTORY:  Family history includes angina in her father, diabetes in a  paternal aunt and maternal aunt, stomach cancer in paternal grandfather,  stomach cancer in maternal grandfather and stomach cancer in a grandmother.  Her mother had migraines.   ALLERGIES:  She is allergic or intolerant to PENICILLIN, SHELLFISH, ADVAIR,  FIORINAL, BIAXIN, NORVASC, SULFA, AVELOX.   MEDICATIONS:  She is presently on:  1.  Allergy injections.  2.  Multivitamins.  3.   Metformin 500 mg daily.  4.  Wellbutrin XR 300 mg daily.  5.  Vytorin 10/40.  6.  Ventolin as needed.   REVIEW OF SYSTEMS:  Review of systems includes pain in the right ear.  She  has also had numbness in her fifth toes bilaterally when she stands for a  period of time.  She has had some  yellow sputum with present illness.   PHYSICAL EXAMINATION:  GENERAL:  On exam, she has mild respiratory  compromise with wheezing.  VITAL SIGNS:  Temperature is 100.0, pulse 68, blood pressure 100/64.  Weight  is 230.  HEENT:  She has an upper dental plate.  Hypo-nasal speech is present.  Otolaryngologic exam is otherwise unremarkable.  LYMPHATICS:  She has no lymphadenopathy about the head, neck or axillae.  CARDIAC:  An S4 with a flow murmur is noted.  LUNGS:  She has diffuse wheezing without definite rub.  ABDOMEN:  Abdomen is massive and protuberant without tenderness.  EXTREMITIES:  Stasis dermatitis is noted over the shins.  Pedal pulses are  intact.  Homans sign  is negative.  NEUROLOGIC:  There are no definite localizing neurologic findings, although  she is anxious.   ASSESSMENT AND PLAN:  Chest x-ray is suggestive of left lower lobe  pneumonia.   She will be admitted to telemetry with aggressive pulmonary toilet and  intravenous antibiotics.  Lovenox coverage will be administered  prophylactically.       WFH/MEDQ  D:  03/31/2005  T:  03/31/2005  Job:  [id number]

## 2011-03-26 NOTE — H&P (Signed)
NAME:  KAHEALANI, YANKOVICH NO.:  [id number]   MEDICAL RECORD NO.:  [id number]          PATIENT TYPE:  INP   LOCATION:  3714                         FACILITY:  [facility]   PHYSICIAN:  Titus Dubin. Alwyn Ren, M.D. [facility] OF BIRTH:  1946/05/26   DATE OF ADMISSION:  10/27/2004  DATE OF DISCHARGE:                                HISTORY & PHYSICAL   HISTORY OF PRESENT ILLNESS:  Dhanya Bogle is a 65 year old female admitted  with status asthmaticus with left pleuritic chest pain.   She was seen on October 22, 2004 with cough productive of scant creamy  material.  She noticed diffuse chest tightness of a non-anginal type.  This  was associated with shortness of breath and cough.  She did not have  symptoms to suggest rhinosinusitis, although she did have some postnasal  drainage and some left upper dental pain.  She was having some fatigue.  The  symptoms were basically localized to the chest.   She was afebrile and had no lymphadenopathy.  Chest was clear after a single  treatment of Xopenex.  O2 SATS were 96% on room air.  Peak flow as 110 prior  to the nebulizer treatments.   She was placed on Xopenex 1.25 mg every 6-8 hours, along with Azmacort 2  puffs twice a day and Levaquin 750 mg.  She has a history of Advair causing  tachycardia; it was for this reason that Xopenex was initiated.   She called back on October 26, 2004, stating that she was still having  problems and was no better, having difficulty breathing or sleeping.  She  was dyspneic with minimal exertion.  Prednisone 20 mg twice a day for 5 days  then 1 daily was recommended.  She declined this, stating that she felt it  did not work.   She was seen emergently on October 27, 2004.  She has a little concern that  the Xopenex, which is sodium chloride and sulfuric acid, might be  exacerbating the sulfa allergy.  She was also describing pain in the left  inferior thorax posteriorly with deep breathing or  motion.  Although the  chest was clear at rest, with exertion and cough, she exhibited diffuse  wheezing.   She was hospitalized, as options are limited in view of her declining use of  oral steroids and her multiplicity of drug intolerances and allergies which  include penicillin, Fiorinal, shellfish, Biaxin, Norflex, Advair and  supposedly Singulair.  It is to be noted that although she questioned the  Singulair intolerance, she has taken it intermittently.  She had been using  her Ventolin in addition Xopenex, which was also a concern for the onset of  tachyarrhythmias in view of her past history.   She was last hospitalized in August of 2004 for catheterization.  This  revealed normal coronary arteries with an ejection fraction of 61%.  There  were no regional wall abnormalities.   She had been hospitalized prior to the elective catheterization on June 02, 2004 with chest pain.  She is a gravida 4, para 2.  She has had an  appendectomy, cholecystectomy and tonsillectomy.  She has an extrinsic  asthmatic condition for which she takes allergy injections weekly, which her  husband administers.   There is a history of angina in her father.  A paternal aunt and maternal  aunt have diabetes.  Paternal grandfather and maternal grandfather had  stomach cancer.   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 Jene Every, her  orthopedist.  He felt the condition was exacerbated by her obesity and use  of non-support footwear.  Intermittently, she has had vertigo-type symptoms  with normal tympanograms.   Intermittently, she has had headaches in the past.   She has been on Wellbutrin for depression.   Following the catheterization, she was motivated as far as diet and  exercise.  She demonstrated some weight loss as low as 215.  Her __________  profile in January 2005 had shown mildly elevated particle  number of 1578  with overall large size of 22.1, so that the particle number is 1578, LDL  size is 22.1.  She demonstrated a total cholesterol of 260 with an LDL of  179 at that time with an HDL of 56.  Hemoglobin A1c was 5.6%, indicating a  non-diabetic state, although she exhibited the stigmata of metabolic  syndrome.  In January of 2005, her total cholesterol was 236, based on a  repeat panel.  She had not returned for followup of her metabolic syndrome  since January, but had been seen for sinusitis-type symptoms.   When seen on October 27, 2004, she admitted that she had quit exercising  and dieting, and weight was now noted to be 234 pounds.   MEDICATIONS:  Medications are listed above.  Additionally, she has been on  glucosamine which could exacerbate her hyperlipidemia.  She has been on  calcium and minerals p.r.n.  She has also been on Metformin 500 mg daily,  Wellbutrin XR 300 mg daily.  She is on Estrace vaginal cream if needed.   PHYSICAL EXAMINATION:  GENERAL:  On exam, she is morbidly obese and  deconditioned.  She is 5 foot 3 and weighs 234 pounds.  Her affect was one  of marked anxiety.  VITAL SIGNS:  She was afebrile.  Pulse was 60 and regular, respiratory rate  19 and blood pressure 120/78.  HEENT:  Arteriolar narrowing was present on fundal examination.  She has an  upper partial.  There is mild edema of the nares and oropharynx without any  obstruction.  Otolaryngologic exam was otherwise unremarkable.  NECK:  Thyroid was normal to palpation.  LYMPHATICS:  She has no lymphadenopathy of the head, neck or axillae.  CHEST:  As stated, the chest was quiet, but she exhibited wheezing with any  motion.  CARDIAC:  S4 was present.  ABDOMEN:  Abdomen is massive.  There is an oblique operative scar in the  right upper quadrant.  EXTREMITIES:  Homans sign is negative.  Pedal pulses are intact and there is  no edema. NEUROLOGIC:  There are no localized neuropsychiatric  symptoms or signs  except for the anxiety.  MUSCULOSKELETAL:  She is markedly deconditioned, unable to sit up from a  supine position due to her body habitus without help.   PLAN:  She will be admitted to telemetry with a PTE protocol because of her  pleuritic pain and the asthmatic component.   Fasting lipid panel and hemoglobin A1c will also  be rechecked.  At the time  of discharge, she will be referred to the lipid clinic.  I discussed frankly  my concerns about her risk of heart attack or stroke, despite the normal  catheterization in 2004; normal would mean that she has less than 30%  blockage, that she has most of the risk  factors of metabolic syndrome which would predispose her to plaque  instability and high risk of cardiovascular event.  Metformin will be held  in the hospital and sliding-scale coverage with Regular insulin initiated.  Lorazepam will be provided for stress and Wellbutrin will be continued.       WFH/MEDQ  D:  10/28/2004  T:  10/28/2004  Job:  [id number]

## 2011-03-26 NOTE — Discharge Summary (Signed)
NAME:  Nicole Mccann, RULLO NO.:  [id number]   MEDICAL RECORD NO.:  [id number]          PATIENT TYPE:  INP   LOCATION:  6742                         FACILITY:  [facility]   PHYSICIAN:  Rene Paci, M.D. LHCDATE OF BIRTH:  03-24-1946   DATE OF ADMISSION:  03/30/2005  DATE OF DISCHARGE:  04/01/2005                                 DISCHARGE SUMMARY   DISCHARGE DIAGNOSES:  1. Community-acquired pneumonia.  2. Diabetes.  3. Hyperlipidemia.     HISTORY OF PRESENT ILLNESS:  The patient is a 65 year old female with a  history of asthma who presented with complaints of fever and shortness of  breath and chest pain.  A chest x-ray is performed at the [facility] office  which suggested a left lower lobe pneumonia.  The patient was admitted to  [facility] for further evaluation and IV antibiotics.   PAST MEDICAL HISTORY:  1. Diabetes.  2. Asthma.  3. Vertigo.  4. Dyslipidemia.  5. Depression.     COURSE OF HOSPITALIZATION:  The patient was admitted on Mar 30, 2005.  The  patient was placed on IV Zithromax and IV Rocephin.  In addition, the  patient was placed on albuterol nebulizer.  The patient continued to improve  during hospitalization.  The patient's blood sugars were  monitored during  hospitalization and remained in good control.   The patient clinically improved at time of discharge.  The patient remains  afebrile with a temperature at time of discharge of 97.8.  Breathing is  even, nonlabored.  The patient did have positive expiratory wheeze and was  instructed to use nebulizer q.6h. at home.  The patient will be discharged  to home on:  1. Ceftin 500 mg p.o. twice daily for an additional seven days.  2. Vytorin 10/40 at bedtime.  3. Wellbutrin XR 300 mg  daily.  4. Albuterol nebulizer q.6h.     LABORATORIES AT DISCHARGE:  Hemoglobin 14, hematocrit 41.4, white count 8.1.   FOLLOW UP:  The patient is to follow up with Dr. Alwyn Ren.  The appointment  was made for the patient on April 09, 2005 at 9:30 a.m.      MSO/MEDQ  D:  04/01/2005  T:  04/01/2005  Job:  [id number]   cc:   Titus Dubin. Alwyn Ren, M.D. [facility]

## 2011-03-26 NOTE — Cardiovascular Report (Signed)
   NAME:  Nicole Mccann Mccann, Nicole Mccann NO.:  [id number]   MEDICAL RECORD NO.:  [id number]                   PATIENT TYPE:  INP   LOCATION:  3702                                 FACILITY:  [facility]   PHYSICIAN:  Salvadore Farber, M.D.             DATE OF BIRTH:  June 08, 1946   DATE OF PROCEDURE:  06/20/2003  DATE OF DISCHARGE:                              CARDIAC CATHETERIZATION   PROCEDURE:  Left heart catheterization, left ventriculography, coronary  angiography.   INDICATIONS:  [name] is a 65 year old lady with morbid obesity who  presents with chest pain occurring with minimal exertion, but not at rest  over the past several weeks.  She was hospitalized and ruled out for  myocardial infarction and referred for diagnostic angiography.   PROCEDURAL TECHNIQUE:  Informed consent was obtained.  Under 1% lidocaine  local anesthesia a 6-French sheath was placed in the right femoral artery  using the modified Seldinger technique.  Diagnostic angiography and  ventriculography were performed using JL4, JR4, and pigtail catheters.  The  patient tolerated the procedure well and was transferred to the holding room  in stable condition.  Sheaths are to be removed there.   COMPLICATIONS:  None.   FINDINGS:  1. LV 137/13/22.  EF 61% without regional wall motion abnormality.  2. No aortic stenosis or mitral regurgitation.  3. Left main:  Angiographically normal.  4. LAD:  Small vessel which does not reach the apex of the heart.  Gives     rise to three small diagonal branches.  It is angiographically normal.  5. Circumflex:  Moderate sized vessel giving rise to two obtuse marginals.     It is angiographically normal.  6. RCA:  Very large, dominant vessel.  It is angiographically normal.   IMPRESSION/RECOMMENDATIONS:  1. Angiographically normal coronary arteries.  2. Normal left ventricular size and systolic function.  3. No aortic stenosis or mitral regurgitation.   Suspect noncardiac etiology to her chest discomfort.  The patient will  follow up with Titus Dubin. Alwyn Ren, M.D. [facility] for further evaluation.                                               Salvadore Farber, M.D.    WED/MEDQ  D:  06/20/2003  T:  06/20/2003  Job:  [id number]   cc:   Titus Dubin. Alwyn Ren, M.D. [facility]

## 2011-03-26 NOTE — Discharge Summary (Signed)
NAME:  [name], [name] NO.:  [id number]   MEDICAL RECORD NO.:  [id number]                   PATIENT TYPE:  INP   LOCATION:  3702                                 FACILITY:  [facility]   PHYSICIAN:  Nicole Mccann, M.D.             DATE OF BIRTH:  October 24, 1946   DATE OF ADMISSION:  06/18/2003  DATE OF DISCHARGE:  06/20/2003                           DISCHARGE SUMMARY - REFERRING   PROCEDURES:  1. Cardiac catheterization.  2. Coronary arteriogram.  3. Left ventriculogram.   HOSPITAL COURSE:  [name] is a 65 year old female with a history of  chest pain with admission in July 2004 where myocardial infarction was ruled  out.  It was felt at that time that her symptoms were likely secondary to  bronchospasm.  She was seen in the office on 06/18/2003, after being referred  by Primary Care.  She was having new onset exertional chest discomfort.  Because of that, it was felt that she needed hospital admission for further  evaluation.   Her symptoms were reviewed as well as her risk factors, and it was felt that  cardiac catheterization was the best evaluation.  Cardiac catheterization  was performed on 06/20/2003.  The cardiac catheterization showed normal  coronary arteries and an EF of 61% without regional wall motion  abnormalities.  Dr. Samule Ohm reviewed the films and felt she had  angiographically normal coronaries and a normal left ventricular size and  systolic function.  He felt that there was a noncardiac etiology to her  chest pain.   [name]'s bed rest is pending completion, but once her bed rest is  completed, if she is ambulating without difficulty, she is considered stable  for discharge on 06/20/2003.   LABORATORY DATA:  Hemoglobin 14.2, hematocrit 42.0, WBC 7.5, platelets 220.  Sodium 141, potassium 4.0, chloride 107, CO2 28, BUN 12, creatinine 0.8,  glucose 111.  Other CMET values within normal limits.  Serial CK, MB, and  troponin  I negative for MI.  Total cholesterol 245, triglycerides 88, HDL  55, LDL 172.  TSH 2.234.   Chest x-ray:  No active disease.  Heart and lungs were normal for portable  technique.   CONDITION ON DISCHARGE:  Stable.   DISCHARGE DIAGNOSES:  1. Chest pain.  No coronary artery disease by catheterization with preserved     left ventricular function.  2. Diabetes mellitus.  3. Status post cholecystectomy and appendectomy.  4. Longstanding history of asthma.  5. History of LACTOSE intolerance.  6. Occasional reflux symptoms.  7. Hyperlipidemia.  8. Obesity.  9. History of SHELLFISH allergy.   DISCHARGE INSTRUCTIONS:  1. Her activity level is to include no driving, sexual or strenuous activity     for two days.  2. She is to call the office for problems with the catheterization site.  3. She is to stick to a low-fat, diabetic diet.  4. She is to follow  up with the P.A[facility] on August 26 at 12:30 p.m.   DISCHARGE MEDICATIONS:  1. Wellbutrin b.i.d.  2. Singulair 10 mg q.h.s.  3. Allergy injection weekly.  4. Vitamin E 400 international units daily.  5. Glucosamine, gingko biloba, and multivitamins as prior to admission.  6. Inhalers p.r.n.  7. Norvasc 5 mg p.o. daily.  8. Pepcid 20 mg p.o.      Nicole Mccann, P.A. [facility]                  E. Graceann Congress, M.D.    RG/MEDQ  D:  06/20/2003  T:  06/20/2003  Job:  [id number]   cc:   Titus Dubin. Alwyn Ren, M.D. [facility]

## 2011-03-26 NOTE — H&P (Signed)
NAME:  Nicole Mccann, Nicole Mccann                   ACCOUNT NO.:  [id number]   MEDICAL RECORD NO.:  [id number]                   PATIENT TYPE:  INP   LOCATION:  4705                                 FACILITY:  [facility]   PHYSICIAN:  Titus Dubin. Alwyn Ren, M.D. [facility]         DATE OF BIRTH:  [birth year]   DATE OF ADMISSION:  06/03/2003  DATE OF DISCHARGE:                                HISTORY & PHYSICAL   HISTORY OF PRESENT ILLNESS:  Nicole Mccann is a 65 year old white female  admitted with chest and arm pain.   She was seen emergently after developing shortness of breath and  subsequently developing chest pain.  Approximately two hours prior to the  emergent office visit, she had been using Sevin spray for beetles on her  ornamental plants outside the home.  She developed shortness of breath and  stopped these activities.  After showering and eating, she developed pain in  the left arm and dizziness.  Subsequently, she noted substernal chest pain  with respirations.  After using the spray she did have a loose bowel  movement.   In the office, breath sounds were decreased but there was no wheezing.  She  was treated with a nebulized Xopenex 1.25 mg.  There was improvement in her  shortness of breath and chest pain but she continued to have left upper  extremity paresthesias prompting transport to the hospital for admission.  After EMT arrived, she described recurrent pain.   ALLERGIES:  1. PENICILLIN.  2. Benny Lennert.   MEDICATIONS:  She is on multivitamins, glucosamine sulfate, Ginkgo biloba  extract, calcium and vitamin D,  Wellbutrin XL 150 mg two daily.  She uses  Ventolin metered dose inhaler as needed.   PAST MEDICAL HISTORY:  1. Four pregnancies and two deliveries.  2. She has had an appendectomy, cholecystectomy, tonsillectomy.  3. She has had asthma which has an extrinsic component.  She is on allergy     injections weekly which her husband administers.  Her asthma has been  relatively quiet in the last several years.   FAMILY HISTORY:  Her father had angina.  Paternal aunt and maternal aunt had  diabetes.  Paternal grandfather and maternal grandfather and one grandmother  had stomach cancer.   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  She was seen by Dr. Jene Every in  June of this year for this.  He felt that this was exacerbated by her  obesity and use of a nonsupport shoe.   She also was evaluated in August of 2003, for otalgia, vertigo, and oral  fullness.  Normal middle ear function was found at tympanogram.   She was evaluated in January of this year for edema and headache.  She was  given Neurontin _____ mg every eight hours as needed for headache.   She was placed on Wellbutrin at that time for depression.   Obesity has  been a longstanding problem.  [employer] low carb program was  recommended.  Despite this, there has been only minimal weight loss.  She is  very sensitive to insect bites.  She was bitten by a mosquito the day of the  office visit as well as in the recent past.  She uses Benadryl as needed.   PHYSICAL EXAMINATION:  GENERAL APPEARANCE:  She sits in a wheelchair covered  in blankets with heating pad on her chest.  VITAL SIGNS:  Pulse was 68 and regular, respiratory rate 22 and blood  pressure 130/78.  HEENT:  Arteriola narrowing is noted on fundal examination.  She has an  upper partial.  The remainder of the otolaryngologic examination is  unremarkable.  LYMPHATICS:  There is no lymphadenopathy about the head, neck or axilla.  LUNGS:  Breath sounds are decreased but there is no wheezing.  CARDIOVASCULAR:  She has a slow S4 with no murmurs.  ABDOMEN:  Protuberant.  SKIN:  There is an urticarial lesion over the left forearm.  EXTREMITIES:  Pedal pulses are decreased.  She has no edema.  NEUROLOGIC:  Homan's sign is negative.  There are no localizing  neurologic  signs but examination is limited by her wheelchair bound state.  She is  extremely anxious and tearful.   EKG revealed inversion of T-wave in V1 through V3.   The O2 saturations were 98% on three liters following a nebulizer treatment.   PLAN:  She will be admitted to telemetry for observation with cardiac  enzymes.  The asthmatic bronchitis will be treated with nebulized  bronchodilators every four hours as needed.  It is most important to rule  out any acute myocardial injury possibly precipitated by the Sevin  inhalation induced bronchospasm.  She has positive family history of  coronary artery disease, and has risk factors of obesity and sedentary  lifestyle.  While hospitalized, lipids will be checked as well, in addition  to hemoglobin A1C as she is predisposed to the metabolic syndrome in view of  the above risk pattern.                                                Titus Dubin. Alwyn Ren, M.D. [facility]    [facility]/MEDQ  D:  06/04/2003  T:  06/04/2003  Job:  [id number]

## 2011-05-05 ENCOUNTER — Other Ambulatory Visit: Payer: Self-pay | Admitting: Internal Medicine

## 2011-05-05 MED ORDER — BUPROPION HCL ER (SR) 150 MG PO TB12: 150.0000 mg | ORAL_TABLET | Freq: Two times a day (BID) | ORAL | Status: DC

## 2011-05-05 NOTE — Telephone Encounter (Signed)
Patient wants rx for bupropion hcl sr-150 mg - twice daily - 90 day supply - patient wants to price shop - wants to pick up today call when ready

## 2011-07-07 ENCOUNTER — Ambulatory Visit (INDEPENDENT_AMBULATORY_CARE_PROVIDER_SITE_OTHER): Payer: 59 | Admitting: Internal Medicine

## 2011-07-07 ENCOUNTER — Encounter: Payer: Self-pay | Admitting: Internal Medicine

## 2011-07-07 DIAGNOSIS — J45909 Unspecified asthma, uncomplicated: Secondary | ICD-10-CM

## 2011-07-07 DIAGNOSIS — E119 Type 2 diabetes mellitus without complications: Secondary | ICD-10-CM

## 2011-07-07 DIAGNOSIS — Z0000 Encounter for general adult medical examination without abnormal findings: Secondary | ICD-10-CM

## 2011-07-07 DIAGNOSIS — E785 Hyperlipidemia, unspecified: Secondary | ICD-10-CM

## 2011-07-07 NOTE — Progress Notes (Signed)
Subjective:    Patient ID: Nicole Mccann, female    DOB: 1946-09-24, 65 y.o.   MRN: [id number]  HPI Medicare Wellness Visit:  The following psychosocial & medical history were reviewed as required by Medicare.   Social history: caffeine: minimal , alcohol:  1 glass wine/ day ,  tobacco use : never  & exercise : no.   Home & personal  safety / fall risk: occasionally with stairs, activities of daily living: no limitations , seatbelt use : yes , and smoke alarm employment : yes .  Power of Attorney/Living Will status : ?  Vision ( as recorded per Nurse) & Hearing  evaluation :  Wall chart read & whisper heard @ 6 ft. Orientation :oriented X 3 , memory & recall :good, spelling  testing: good,and mood & affect : normal . Depression / anxiety: depression intermittent, on meds Travel history : [place] 2011 , immunization status :Shingles needed , transfusion history:  no, and preventive health surveillance ( colonoscopies, BMD , etc as per protocol/ [facility]): colonoscopy due 2016; annual mammograms;Gyn care overdue.Dental care:  Sept 2011. Chart reviewed &  Updated. Active issues reviewed & addressed.       Review of Systems HYPERLIPIDEMIA: Chest pain, palpitations- no       Dyspnea- DOE due to weight & deconditioning Medications: Compliance- yes  Lightheadedness,Syncope- no    Edema- yes Abd pain, bowel changes- no   Muscle aches- yes, taking Acupuncture for back & R thigh pain    DIABETES: Disease Monitoring:Dr Clark , Endo sen 04/2011 Blood Sugar ranges-average 105  Polyuria/phagia/dipsia- only polyuria       Visual problems- no; last Ophth exam 01/12, no retinopathy Medications: Compliance- yes  Hypoglycemic symptoms- no  Chart review: last labs 02/2010         Objective:   Physical Exam Gen.: Weight excess. Alert, appropriate and cooperative throughout exam. Head: Normocephalic without obvious abnormalities Eyes: No corneal or conjunctival inflammation noted. Pupils  equal round reactive to light and accommodation. Fundal exam is benign without hemorrhages, exudate, papilledema. Extraocular motion intact. Vision grossly normal. Ears: External  ear exam reveals no significant lesions or deformities. Canals clear .TMs normal. Hearing is grossly normal bilaterally. Nose: External nasal exam reveals no deformity or inflammation. Nasal mucosa are pink and moist. No lesions or exudates noted. Mouth: Oral mucosa and oropharynx reveal no lesions or exudates. Teeth in good repair; upper partial. Neck: No deformities, masses, or tenderness noted. Range of motion & . Thyroid normal. Lungs: Normal respiratory effort; chest expands symmetrically. Lungs are clear to auscultation without rales, wheezes, or increased work of breathing. Heart: Normal rate and rhythm. Normal S1 and S2. No gallop, click, or rub. S4 w/o murmur. Abdomen: Protuberant.Bowel sounds normal; abdomen soft and nontender. No masses, organomegaly or hernias noted.  Genitalia: as per Gyn   .                                                                                   Musculoskeletal/extremities: Lordosis  noted of  the thoracic  spine. No clubbing, cyanosis, edema, or deformity noted. Tone & strength  normal.Joints normal. Nail health  Good.  Crepitus  of knees. Vascular: Carotid, radial artery, dorsalis pedis and  posterior tibial pulses are full and equal. No bruits present. Neurologic: Alert and oriented x3. Deep tendon reflexes symmetrical and normal.          Skin: Intact without suspicious lesions or rashes. Lymph: No cervical, axillary  lymphadenopathy present. Psych: Mood and affect are normal. Normally interactive                                                                                         Assessment & Plan:  #1 Medicare Wellness Exam; criteria met ; data entered #2 Problem List reviewed ; Assessment/ Recommendations made  #3 right thigh pain; this is probably radicular pain  from the lumbosacral spine. A CK level is recommended toevaluate any possible myalgia  #4 increased lordosis of the thoracic spine; this suggests possible osteoporosis. Vitamin D level should be checked. Vitamin D deficiency can also cause muscle pain. Plan: see Orders

## 2011-07-07 NOTE — Patient Instructions (Addendum)
Eat a low-fat diet with lots of fruits and vegetables, up to 7-9 servings per day. Avoid obesity; your goal is waist measurement < 40 inches.Consume less than 40 grams of sugar per day from foods & drinks with High Fructose Corn Sugar as #1,2,3 or # 4 on label. Follow the low carb nutrition program in [employer] as closely as possible to prevent Diabetes progression & complications. White carbohydrates (potatoes, rice, bread, and pasta) have a high spike of sugar and a high load of sugar. For example a  baked potato has a cup of sugar and a  french fry  2 teaspoons of sugar. Yams, wild  rice, whole grained bread &  wheat pasta have been much lower spike and load of  sugar. Portions should be the size of a deck of cards or your palm.  Please  schedule fasting Labs IF NOT DONE BY DR Chestine Spore : BMET,Lipids, hepatic panel, CBC & dif, TSH, A1c, urine microalbumin, CK, vitamin D level (250.00,272.4,995.20,729.1,733.00).

## 2011-07-26 ENCOUNTER — Other Ambulatory Visit: Payer: Self-pay | Admitting: Internal Medicine

## 2011-07-26 DIAGNOSIS — Z1231 Encounter for screening mammogram for malignant neoplasm of breast: Secondary | ICD-10-CM

## 2011-09-07 ENCOUNTER — Ambulatory Visit
Admission: RE | Admit: 2011-09-07 | Discharge: 2011-09-07 | Disposition: A | Discharge status: Home / Self Care | Payer: 59 | Source: Ambulatory Visit | Attending: Internal Medicine | Admitting: Internal Medicine

## 2011-09-07 DIAGNOSIS — Z1231 Encounter for screening mammogram for malignant neoplasm of breast: Secondary | ICD-10-CM

## 2011-09-27 ENCOUNTER — Ambulatory Visit (HOSPITAL_COMMUNITY)
Admission: RE | Admit: 2011-09-27 | Discharge: 2011-09-27 | Disposition: A | Discharge status: Home / Self Care | Payer: 59 | Source: Ambulatory Visit | Attending: Internal Medicine | Admitting: Internal Medicine

## 2011-09-27 ENCOUNTER — Other Ambulatory Visit: Payer: Self-pay | Admitting: Internal Medicine

## 2011-09-27 DIAGNOSIS — R29898 Other symptoms and signs involving the musculoskeletal system: Secondary | ICD-10-CM | POA: Insufficient documentation

## 2011-09-27 DIAGNOSIS — R42 Dizziness and giddiness: Secondary | ICD-10-CM

## 2011-09-27 DIAGNOSIS — M47812 Spondylosis without myelopathy or radiculopathy, cervical region: Secondary | ICD-10-CM | POA: Insufficient documentation

## 2011-09-27 DIAGNOSIS — R209 Unspecified disturbances of skin sensation: Secondary | ICD-10-CM | POA: Insufficient documentation

## 2011-09-27 DIAGNOSIS — F29 Unspecified psychosis not due to a substance or known physiological condition: Secondary | ICD-10-CM | POA: Insufficient documentation

## 2011-09-27 DIAGNOSIS — E119 Type 2 diabetes mellitus without complications: Secondary | ICD-10-CM | POA: Insufficient documentation

## 2011-09-27 DIAGNOSIS — R4701 Aphasia: Secondary | ICD-10-CM | POA: Insufficient documentation

## 2011-09-27 DIAGNOSIS — R51 Headache: Secondary | ICD-10-CM | POA: Insufficient documentation

## 2011-10-20 ENCOUNTER — Other Ambulatory Visit: Payer: Self-pay | Admitting: Neurology

## 2011-10-20 DIAGNOSIS — G43809 Other migraine, not intractable, without status migrainosus: Secondary | ICD-10-CM

## 2011-10-20 DIAGNOSIS — R42 Dizziness and giddiness: Secondary | ICD-10-CM

## 2011-10-20 DIAGNOSIS — H9319 Tinnitus, unspecified ear: Secondary | ICD-10-CM

## 2011-10-20 DIAGNOSIS — R439 Unspecified disturbances of smell and taste: Secondary | ICD-10-CM

## 2011-10-21 ENCOUNTER — Ambulatory Visit (INDEPENDENT_AMBULATORY_CARE_PROVIDER_SITE_OTHER): Payer: 59 | Admitting: Internal Medicine

## 2011-10-21 ENCOUNTER — Encounter: Payer: Self-pay | Admitting: Internal Medicine

## 2011-10-21 VITALS — BP 122/84 | HR 72 | Resp 15

## 2011-10-21 DIAGNOSIS — Z01118 Encounter for examination of ears and hearing with other abnormal findings: Secondary | ICD-10-CM

## 2011-10-21 DIAGNOSIS — R439 Unspecified disturbances of smell and taste: Secondary | ICD-10-CM

## 2011-10-21 DIAGNOSIS — H9390 Unspecified disorder of ear, unspecified ear: Secondary | ICD-10-CM

## 2011-10-21 DIAGNOSIS — R431 Parosmia: Secondary | ICD-10-CM

## 2011-10-21 DIAGNOSIS — R51 Headache: Secondary | ICD-10-CM

## 2011-10-21 NOTE — Progress Notes (Signed)
  Subjective:    Patient ID: Nicole Mccann, female    DOB: 17-Oct-1946, 65 y.o.   MRN: [id number]  HPI Symptoms began 4 weeks ago as a sensation of a metallic smell. This was in the context of hypoglycemia with a sugar as low as 84.  After having the sensation of abnormal smell, she will have occipital headache which is somewhat variable but typically sharp. This can radiate to both temples. Nonsteroidals have been of  somewhat benefit in relieving the headache; rest is also  beneifcial.  She also has pressure in the right ear without tinnitus or sudden hearing loss. When she has this symptom, she has loss of sense of smell or perfume or other odors. She also has an associated loss of taste.  Her endocrinologist questioned posterior infarct; MRI without contrast was normal. She did have minimal to mild paranasal sinus mucosal thickening without air-fluid level to suggest rhinosinusitis.  She was referred to Dr. Marylou Flesher, neurologist, who performed an EEG which was normal. Subsequent evaluation was to include  additional  Imaging  Studies, ? MRA/MRI. The neurologist prescribed generic Topamax, promethazine, and alprazolam. She is taking none of these  because of subjective intolerances.  She came to see me at the recommendation of her acupuncturist before pursuing additional imaging as recommended by Dr Marylou Flesher.    Review of Systems she has had some chills and sweats without fever. She denies any purulent sputum or nasal discharge. Her husband states that Dr. Chestine Spore performed several tests to within the last week. No results are found in EMR; this suggests he uses a outside reference lab. I recommended that they obtain copies of those tests to share with all doctors seen.     Objective:   Physical Exam  On exam she is in no acute distress.  Pupils are equal round and reactive to light. Extraocular motion and field of vision are normal. Vision is grossly normal.  She describes increased  sensation to light touch over the left face.  Tuning fork exam lateralizes to the left. The otic canals are clear and tympanic membranes are normal. Hearing is intact to whisper at 6 feet.  No carotid bruits are present. Rhythm is regular with an S4; no significant murmurs are present. Skin is warm and dry without jaundice or tenting.  Breath sounds are somewhat decreased; no significant bronchospasm is present.  The left knee reflex appears to be slightly decreased. Strength and tone is normal in the extremities        Assessment & Plan:   #1 occipital headache associated with sensory changes (abnormal smell). The tuning fork exam suggests lateralization to the left. Subjectively she has asymmetric facial sensation  #2 recent relative hypoglycemia.  Plan: The neurology evaluation must be completed. If no diagnosis is forthcoming; ENT referral could be considered in view of the tuning fork findings and the issue with change in sense of smell. Her husband questioned whether the symptoms could be related to sinusitis. I explained that this MRI rules out any active, significant sinusitis (rhinosinusitis).

## 2011-10-24 ENCOUNTER — Ambulatory Visit
Admission: RE | Admit: 2011-10-24 | Discharge: 2011-10-24 | Disposition: A | Discharge status: Home / Self Care | Payer: 59 | Source: Ambulatory Visit | Attending: Neurology | Admitting: Neurology

## 2011-10-24 DIAGNOSIS — R439 Unspecified disturbances of smell and taste: Secondary | ICD-10-CM

## 2011-10-24 DIAGNOSIS — H9319 Tinnitus, unspecified ear: Secondary | ICD-10-CM

## 2011-10-24 DIAGNOSIS — G43809 Other migraine, not intractable, without status migrainosus: Secondary | ICD-10-CM

## 2011-10-24 DIAGNOSIS — R42 Dizziness and giddiness: Secondary | ICD-10-CM

## 2011-10-24 NOTE — Patient Instructions (Signed)
Please obtain copies of recent lab studies. Please reschedule studies recommended by Dr Marylou Flesher.

## 2011-10-26 ENCOUNTER — Telehealth: Payer: Self-pay | Admitting: Internal Medicine

## 2011-10-26 DIAGNOSIS — R439 Unspecified disturbances of smell and taste: Secondary | ICD-10-CM

## 2011-10-26 DIAGNOSIS — R42 Dizziness and giddiness: Secondary | ICD-10-CM

## 2011-10-26 NOTE — Telephone Encounter (Signed)
Dr.Hopper please advise 

## 2011-10-26 NOTE — Telephone Encounter (Signed)
I recommend an ENT  consultation  Because of the small issues; please inform me if you have a physician preference.

## 2011-10-26 NOTE — Telephone Encounter (Signed)
Patient had ct - neuro review said it was good - patient wants to know what to do now

## 2011-10-27 ENCOUNTER — Other Ambulatory Visit: Payer: Self-pay | Admitting: Internal Medicine

## 2011-10-27 ENCOUNTER — Encounter (HOSPITAL_BASED_OUTPATIENT_CLINIC_OR_DEPARTMENT_OTHER): Payer: Self-pay | Admitting: *Deleted

## 2011-10-27 ENCOUNTER — Emergency Department (INDEPENDENT_AMBULATORY_CARE_PROVIDER_SITE_OTHER): Payer: Medicare Other

## 2011-10-27 ENCOUNTER — Emergency Department (HOSPITAL_BASED_OUTPATIENT_CLINIC_OR_DEPARTMENT_OTHER)
Admission: EM | Admit: 2011-10-27 | Discharge: 2011-10-27 | Disposition: A | Discharge status: Home / Self Care | Payer: Medicare Other | Attending: Emergency Medicine | Admitting: Emergency Medicine

## 2011-10-27 DIAGNOSIS — R05 Cough: Secondary | ICD-10-CM

## 2011-10-27 DIAGNOSIS — E119 Type 2 diabetes mellitus without complications: Secondary | ICD-10-CM | POA: Insufficient documentation

## 2011-10-27 DIAGNOSIS — J45909 Unspecified asthma, uncomplicated: Secondary | ICD-10-CM | POA: Insufficient documentation

## 2011-10-27 DIAGNOSIS — R0989 Other specified symptoms and signs involving the circulatory and respiratory systems: Secondary | ICD-10-CM

## 2011-10-27 DIAGNOSIS — R0602 Shortness of breath: Secondary | ICD-10-CM | POA: Insufficient documentation

## 2011-10-27 DIAGNOSIS — E785 Hyperlipidemia, unspecified: Secondary | ICD-10-CM | POA: Insufficient documentation

## 2011-10-27 MED ORDER — ALBUTEROL SULFATE HFA 108 (90 BASE) MCG/ACT IN AERS: 2.0000 | INHALATION_SPRAY | RESPIRATORY_TRACT | Status: DC | PRN

## 2011-10-27 MED ORDER — IPRATROPIUM BROMIDE 0.02 % IN SOLN
0.5000 mg | Freq: Once | RESPIRATORY_TRACT | Status: AC
  Administered 2011-10-27: 0.5 mg via RESPIRATORY_TRACT
  Filled 2011-10-27: qty 2.5

## 2011-10-27 MED ORDER — DOXYCYCLINE HYCLATE 100 MG PO CAPS: 100.0000 mg | ORAL_CAPSULE | Freq: Two times a day (BID) | ORAL | Status: AC

## 2011-10-27 MED ORDER — ALBUTEROL SULFATE (5 MG/ML) 0.5% IN NEBU
5.0000 mg | INHALATION_SOLUTION | Freq: Once | RESPIRATORY_TRACT | Status: AC
  Administered 2011-10-27: 5 mg via RESPIRATORY_TRACT
  Filled 2011-10-27: qty 1

## 2011-10-27 MED ORDER — PREDNISONE 10 MG PO TABS: ORAL_TABLET | ORAL | Status: DC

## 2011-10-27 NOTE — ED Provider Notes (Signed)
History     CSN: [id number] Arrival date & time: 10/27/2011  4:14 PM   First MD Initiated Contact with Patient 10/27/11 1641      Chief Complaint  Patient presents with  . Influenza    (Consider location/radiation/quality/duration/timing/severity/associated sxs/prior treatment) Patient is a 65 y.o. female presenting with flu symptoms. The history is provided by the patient, the spouse and medical records. No language interpreter was used.  Influenza This is a new problem. The current episode started more than 2 days ago. The problem occurs constantly. The problem has not changed since onset.Associated symptoms include shortness of breath. Pertinent negatives include no chest pain, no abdominal pain and no headaches. The symptoms are aggravated by nothing. The symptoms are relieved by nothing. Treatments tried: Albuterol inhaler, with some improvement.    Past Medical History  Diagnosis Date  . Hyperlipidemia   . Allergy   . Diabetes mellitus   . Asthma     Past Surgical History  Procedure Date  . Appendectomy   . Tonsillectomy    . Cholecystectomy   . Colonoscopy 2006  . Cardiac catheterization 2004    negative; EF 61%    Family History  Problem Relation Age of Onset  . Cancer Father     stomach cancer  . Cancer Sister     breast cancer  . Diabetes Maternal Aunt   . Diabetes Paternal Aunt   . Coronary artery disease Father     History  Substance Use Topics  . Smoking status: Never Smoker   . Smokeless tobacco: Not on file  . Alcohol Use: 4.2 oz/week    7 Glasses of wine per week    OB History    Grav Para Term Preterm Abortions TAB SAB Ect Mult Living                  Review of Systems  Constitutional: Negative.  Negative for fever.  HENT: Negative.   Respiratory: Positive for cough, shortness of breath and wheezing.   Cardiovascular: Negative for chest pain.  Gastrointestinal: Negative for abdominal pain.  Genitourinary: Negative.     Musculoskeletal: Negative.   Skin: Negative.   Neurological: Negative for headaches.       Recent evaluation for metallic taste in mouth, headache.  Psychiatric/Behavioral: Negative.     Allergies  Mushroom ext cmplx(shiitake-reishi-mait); Shellfish allergy; Acetaminophen; Advair hfa; Butalbital-aspirin-caffeine; Clarithromycin; Montelukast sodium; Moxifloxacin; Orphenadrine citrate; Penicillins; and Sulfonamide derivatives  Home Medications   Current Outpatient Rx  Name Route Sig Dispense Refill  . ASPIRIN 81 MG PO TABS Oral Take 81 mg by mouth daily.      . BUPROPION HCL ER (SR) 150 MG PO TB12 Oral Take 1 tablet (150 mg total) by mouth 2 (two) times daily. 180 tablet 0  . VITAMIN D3 2000 UNITS PO TABS Oral Take 2,000 Units by mouth daily.     . GUAIFENESIN-DM 100-10 MG/5ML PO SYRP Oral Take 10 mLs by mouth 3 (three) times daily as needed. For cough     . IBUPROFEN 200 MG PO TABS Oral Take 400 mg by mouth every 6 (six) hours as needed. For pain     . LORATADINE 10 MG PO TABS Oral Take 10 mg by mouth daily.      Marland Kitchen ONE-DAILY MULTI VITAMINS PO TABS Oral Take 1 tablet by mouth daily.      . ORLISTAT 60 MG PO CAPS Oral Take 60 mg by mouth daily.      Marland Kitchen SITAGLIPTIN-METFORMIN  HCL 50-500 MG PO TABS Oral Take 0.5 tablets by mouth daily as needed. Take if sugar over 135      BP 105/72  Pulse 76  Temp 99.2 F (37.3 C)  Resp 16  Ht 5' (1.524 m)  Wt 250 lb (113.399 kg)  BMI 48.82 kg/m2  SpO2 98%  Physical Exam  Nursing note and vitals reviewed. Constitutional: She is oriented to person, place, and time.       Morbidly obese woman with wheezy cough.  HENT:  Head: Normocephalic and atraumatic.  Right Ear: External ear normal.  Left Ear: External ear normal.  Mouth/Throat: Oropharynx is clear and moist.  Eyes: Conjunctivae and EOM are normal. Pupils are equal, round, and reactive to light.  Neck: Neck supple.  Cardiovascular: Normal rate and regular rhythm.   Pulmonary/Chest: She  has wheezes.       Patient has a loose cough, and on auscultation has scattered wheezes over both lung fields.  Abdominal: Soft.  Musculoskeletal: Normal range of motion.  Lymphadenopathy:    She has no cervical adenopathy.  Neurological: She is alert and oriented to person, place, and time. She has normal reflexes.       No sensory or motor deficit.  Skin: Skin is warm and dry.  Psychiatric: She has a normal mood and affect. Her behavior is normal.    ED Course  Procedures (including critical care time)  4:49 PM Patient was seen and had physical examination. Albuterol and Atrovent nebulizer treatment was ordered. Chest x-ray was ordered. I discussed her condition with Marga Melnick M.D., her internist. Assuming her chest x-ray is negative we will treat her with doxycycline, albuterol inhaler, and a short course of steroids. 5:40 PM Chest x-ray negative.  Feels better after nebulizer treatment.  Will Rx with doxycycline, albuterol inhaler, and steroid taper.    1. Asthmatic bronchitis            Carleene Cooper III, MD 10/27/11 1745

## 2011-10-27 NOTE — Telephone Encounter (Signed)
Spoke with patient's husband, ok to set up referral

## 2011-10-27 NOTE — ED Notes (Signed)
Pt states flu like symptoms x 5 weeks, seen by several PMD w/o relief

## 2011-11-04 ENCOUNTER — Other Ambulatory Visit: Payer: Self-pay | Admitting: Internal Medicine

## 2011-11-04 DIAGNOSIS — R439 Unspecified disturbances of smell and taste: Secondary | ICD-10-CM

## 2011-11-04 DIAGNOSIS — J329 Chronic sinusitis, unspecified: Secondary | ICD-10-CM

## 2011-11-05 ENCOUNTER — Ambulatory Visit
Admission: RE | Admit: 2011-11-05 | Discharge: 2011-11-05 | Disposition: A | Discharge status: Home / Self Care | Payer: Medicare Other | Source: Ambulatory Visit | Attending: Internal Medicine | Admitting: Internal Medicine

## 2011-11-05 ENCOUNTER — Other Ambulatory Visit: Payer: Self-pay | Admitting: Internal Medicine

## 2011-11-05 DIAGNOSIS — R439 Unspecified disturbances of smell and taste: Secondary | ICD-10-CM

## 2011-11-05 DIAGNOSIS — J329 Chronic sinusitis, unspecified: Secondary | ICD-10-CM

## 2011-11-05 MED ORDER — BUPROPION HCL ER (SR) 150 MG PO TB12: 150.0000 mg | ORAL_TABLET | Freq: Two times a day (BID) | ORAL | Status: DC

## 2011-11-05 NOTE — Telephone Encounter (Signed)
Rx sent 

## 2011-11-12 ENCOUNTER — Encounter: Payer: Self-pay | Admitting: Internal Medicine

## 2011-11-12 ENCOUNTER — Ambulatory Visit (INDEPENDENT_AMBULATORY_CARE_PROVIDER_SITE_OTHER): Payer: 59 | Admitting: Internal Medicine

## 2011-11-12 DIAGNOSIS — J323 Chronic sphenoidal sinusitis: Secondary | ICD-10-CM

## 2011-11-12 DIAGNOSIS — J45901 Unspecified asthma with (acute) exacerbation: Secondary | ICD-10-CM

## 2011-11-12 MED ORDER — IPRATROPIUM-ALBUTEROL 0.5-2.5 (3) MG/3ML IN SOLN: 3.0000 mL | Freq: Four times a day (QID) | RESPIRATORY_TRACT | Status: DC | PRN

## 2011-11-12 MED ORDER — FLUTICASONE PROPIONATE 50 MCG/ACT NA SUSP: 1.0000 | Freq: Two times a day (BID) | NASAL | Status: DC | PRN

## 2011-11-12 NOTE — Patient Instructions (Signed)
Flonase1 spray in each nostril twice a day as needed. Use the "crossover" technique as discussed  

## 2011-11-12 NOTE — Progress Notes (Signed)
  Subjective:    Patient ID: Nicole Mccann, female    DOB: 01/13/1946, 66 y.o.   MRN: [id number]  HPI  The neurologic and otologic evaluations have been completed. CAT scan revealed sphenoid opacification on the left. Her ENT specialist has prescribed an additional course of antibiotics. She's been using a Neti pot and also a vaporizer.  She has a nebulizer but the medication is out of date.    Review of Systems     Objective:   Physical Exam General appearance:well nourished; no acute distress . No increased work of breathing is present; but intermittent nonproductive cough.  No  lymphadenopathy about the head, neck, or axilla noted.   Eyes: No conjunctival inflammation or lid edema is present.  Ears:  External ear exam shows no significant lesions or deformities.  Otoscopic examination reveals clear canals, tympanic membranes are intact bilaterally without bulging, retraction, inflammation or discharge.  Nose:  External nasal examination shows no deformity or inflammation. Nasal mucosa are dry without lesions or exudates. No septal dislocation or deviation.No obstruction to airflow.   Oral exam: Dental hygiene is good; lips and gums are healthy appearing.There is no oropharyngeal erythema or exudate noted.     Heart:  Normal rate and regular rhythm. S1 and S2 normal without gallop, murmur, click, rub or other extra sounds. Heart sounds are distant  Lungs:Chest : Low-grade, diffuse, homogenous, musical wheezes. No rhonchi,rales ,or rubs present. 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  #2 active asthmatic bronchitis; she should use bronchodilators in the nebulizer every 4-6 hours as needed.

## 2012-01-12 ENCOUNTER — Other Ambulatory Visit: Payer: Self-pay | Admitting: *Deleted

## 2012-01-12 MED ORDER — ZOSTER VACCINE LIVE 19400 UNT/0.65ML ~~LOC~~ SOLR: 0.6500 mL | Freq: Once | SUBCUTANEOUS | Status: AC

## 2012-01-12 NOTE — Telephone Encounter (Signed)
Discuss with patient, Rx sent. 

## 2012-01-12 NOTE — Telephone Encounter (Signed)
Ok to send shingles vaccine

## 2012-01-12 NOTE — Telephone Encounter (Signed)
Pt requesting Rx for shingle vaccine to be sent to pharmacy.Please advise ok to send, Pt already check with insurance.

## 2012-03-14 ENCOUNTER — Encounter: Payer: Self-pay | Admitting: Internal Medicine

## 2012-03-14 ENCOUNTER — Ambulatory Visit (INDEPENDENT_AMBULATORY_CARE_PROVIDER_SITE_OTHER): Payer: 59 | Admitting: Internal Medicine

## 2012-03-14 VITALS — BP 128/80 | HR 82 | Temp 99.0°F | Wt 252.2 lb

## 2012-03-14 DIAGNOSIS — R439 Unspecified disturbances of smell and taste: Secondary | ICD-10-CM

## 2012-03-14 DIAGNOSIS — R2689 Other abnormalities of gait and mobility: Secondary | ICD-10-CM

## 2012-03-14 DIAGNOSIS — R51 Headache: Secondary | ICD-10-CM

## 2012-03-14 DIAGNOSIS — R269 Unspecified abnormalities of gait and mobility: Secondary | ICD-10-CM

## 2012-03-14 NOTE — Patient Instructions (Signed)
.  Share results with all MDs seen  

## 2012-03-14 NOTE — Progress Notes (Signed)
  Subjective:    Patient ID: Nicole Mccann, female    DOB: 1946-01-09, 66 y.o.   MRN: [id number]  HPI  Her  increased sense of smell has been  associated with frontal sharp pain as a "brain freeze" , imbalance & r ear pressure.This has been evaluated by Dr Marylou Flesher , Neurology & Dr Emeline Darling , ENT. MRI 09/27/11: paranasal mucosal thickening.MRA 11/05/11; chronic L sphenoid sinus changes  No PMH of migraine headaches; BPV but not recently. Mother had migraines. Daughter had Petit mal seizure disorder as child; 94 mos -65 mos    Review of Systems Dizziness Onset: 11/12 Context: Position change:can occur even sitting Benign positional vertigo symptoms:no Straining:no Pain:no Cardiac prodrome: No palpitations, irregular rhythm, heart rate change Neurologic prodrome:No headache , numbness and tingling, weakness, change in coordination (gait/falling) PRIOR to smell dysfunction appears Syncope:no Seizure activity:no Upper respiratory tract infection/extrinsic symptoms:no Duration:minutes Frequency:smaell dysfunction up to 10 x/ day but headache & imbalance  3-4 X /week, can occur up to 2 or more times / day Associated signs and symptoms: Visual change (blurred/double/loss): no Hearing loss/tinnitus:no Nausea/sweating: Chest pain:no Dyspnea:no Treatment/response:rest, NSAIDS help frontal pain       Objective:   Physical Exam  Gen. appearance: Weight excess, in no distress Eyes: Extraocular motion intact, field of vision normal, vision grossly intact, no nystagmus ENT: Canals clear, tympanic membranes normal, tuning fork exam :localizes to R, hearing grossly normal Neck: Normal range of motion, no masses, normal thyroid Cardiovascular: Rate and rhythm normal; no murmurs, gallops or extra heart sounds Muscle skeletal: Range of motion, tone, &  strength normal Neuro:no cranial nerve deficit, deep tendon  reflexes normal, gait normal. Romberg & finger to nose normal Lymph: No cervical or  axillary LA Skin: Warm and dry without suspicious lesions or rashes Psych: no anxiety or mood change. Normally interactive and cooperative.         Assessment & Plan:  #1 smell disorder ; ? olefactory seizures #2 headache & imbalance secondary to # 1 Plan: referral to [facility]

## 2012-05-22 ENCOUNTER — Other Ambulatory Visit: Payer: Self-pay | Admitting: Internal Medicine

## 2012-05-22 MED ORDER — BUPROPION HCL ER (SR) 150 MG PO TB12: 150.0000 mg | ORAL_TABLET | Freq: Two times a day (BID) | ORAL | Status: DC

## 2012-05-22 NOTE — Telephone Encounter (Signed)
Patient is aware that rx sent to pharmacy electronically. Patient was informed that she should contact the pharmacy at least 1 week in advance to verify that she doesn't run out of medication in the future

## 2012-05-22 NOTE — Telephone Encounter (Signed)
BUPROPION HCL SR Tablets 150mg  Refills: 2 Take 1 tablet twice a day

## 2012-05-23 MED ORDER — BUPROPION HCL ER (SR) 150 MG PO TB12: 150.0000 mg | ORAL_TABLET | Freq: Every day | ORAL | Status: DC

## 2012-05-23 NOTE — Telephone Encounter (Signed)
I called and cancelled rx at [facility], I then sent a rx electronically to [facility] (rx was sent to [facility] due to at the beginning of this message [facility] is [facility] at the pharmacy)

## 2012-05-23 NOTE — Telephone Encounter (Signed)
I called patient's husband and informed him rx sent over

## 2012-05-23 NOTE — Addendum Note (Signed)
Addended by: Maurice Small on: 05/23/2012 01:36 PM   Modules accepted: Orders

## 2012-05-23 NOTE — Telephone Encounter (Signed)
Patients husband called and is upset that the pt does not have her medication yet. They requested it be called into [facility] on [facility], but it was called into [facility]. Patients husband is requesting that we handle this as soon as possible.

## 2012-06-05 ENCOUNTER — Emergency Department (HOSPITAL_BASED_OUTPATIENT_CLINIC_OR_DEPARTMENT_OTHER)
Admission: EM | Admit: 2012-06-05 | Discharge: 2012-06-05 | Disposition: A | Discharge status: Home / Self Care | Payer: Medicare Other | Attending: Emergency Medicine | Admitting: Emergency Medicine

## 2012-06-05 ENCOUNTER — Ambulatory Visit (INDEPENDENT_AMBULATORY_CARE_PROVIDER_SITE_OTHER): Payer: Medicare Other | Admitting: Family Medicine

## 2012-06-05 ENCOUNTER — Encounter (HOSPITAL_BASED_OUTPATIENT_CLINIC_OR_DEPARTMENT_OTHER): Payer: Self-pay | Admitting: Emergency Medicine

## 2012-06-05 ENCOUNTER — Ambulatory Visit (HOSPITAL_BASED_OUTPATIENT_CLINIC_OR_DEPARTMENT_OTHER)
Admission: RE | Admit: 2012-06-05 | Discharge: 2012-06-05 | Disposition: A | Discharge status: Home / Self Care | Payer: Medicare Other | Source: Ambulatory Visit | Attending: Family Medicine | Admitting: Family Medicine

## 2012-06-05 ENCOUNTER — Encounter: Payer: Self-pay | Admitting: Family Medicine

## 2012-06-05 VITALS — BP 132/82 | HR 84 | Temp 98.2°F | Ht 60.0 in | Wt 257.2 lb

## 2012-06-05 DIAGNOSIS — E785 Hyperlipidemia, unspecified: Secondary | ICD-10-CM | POA: Insufficient documentation

## 2012-06-05 DIAGNOSIS — T801XXA Vascular complications following infusion, transfusion and therapeutic injection, initial encounter: Secondary | ICD-10-CM

## 2012-06-05 DIAGNOSIS — I998 Other disorder of circulatory system: Secondary | ICD-10-CM

## 2012-06-05 DIAGNOSIS — Y842 Radiological procedure and radiotherapy as the cause of abnormal reaction of the patient, or of later complication, without mention of misadventure at the time of the procedure: Secondary | ICD-10-CM

## 2012-06-05 DIAGNOSIS — J45909 Unspecified asthma, uncomplicated: Secondary | ICD-10-CM | POA: Insufficient documentation

## 2012-06-05 DIAGNOSIS — M79609 Pain in unspecified limb: Secondary | ICD-10-CM | POA: Insufficient documentation

## 2012-06-05 DIAGNOSIS — I809 Phlebitis and thrombophlebitis of unspecified site: Secondary | ICD-10-CM

## 2012-06-05 DIAGNOSIS — E119 Type 2 diabetes mellitus without complications: Secondary | ICD-10-CM | POA: Insufficient documentation

## 2012-06-05 DIAGNOSIS — I82619 Acute embolism and thrombosis of superficial veins of unspecified upper extremity: Secondary | ICD-10-CM | POA: Insufficient documentation

## 2012-06-05 DIAGNOSIS — I82409 Acute embolism and thrombosis of unspecified deep veins of unspecified lower extremity: Secondary | ICD-10-CM

## 2012-06-05 DIAGNOSIS — M7989 Other specified soft tissue disorders: Secondary | ICD-10-CM | POA: Insufficient documentation

## 2012-06-05 HISTORY — DX: Vascular complications following infusion, transfusion and therapeutic injection, initial encounter: T80.1XXA

## 2012-06-05 HISTORY — DX: Phlebitis and thrombophlebitis of unspecified site: I80.9

## 2012-06-05 LAB — CBC WITH DIFFERENTIAL/PLATELET
Basophils Relative: 0 % (ref 0–1)
Eosinophils Absolute: 0.3 10*3/uL (ref 0.0–0.7)
Eosinophils Relative: 3 % (ref 0–5)
HCT: 43.2 % (ref 36.0–46.0)
Hemoglobin: 14.7 g/dL (ref 12.0–15.0)
MCH: 30.7 pg (ref 26.0–34.0)
MCHC: 34 g/dL (ref 30.0–36.0)
MCV: 90.2 fL (ref 78.0–100.0)
Monocytes Absolute: 0.9 10*3/uL (ref 0.1–1.0)
Monocytes Relative: 9 % (ref 3–12)
Neutrophils Relative %: 57 % (ref 43–77)

## 2012-06-05 LAB — COMPREHENSIVE METABOLIC PANEL
Albumin: 4 g/dL (ref 3.5–5.2)
BUN: 18 mg/dL (ref 6–23)
Creatinine, Ser: 0.8 mg/dL (ref 0.50–1.10)
GFR calc Af Amer: 86 mL/min — ABNORMAL LOW (ref >90)
Glucose, Bld: 113 mg/dL — ABNORMAL HIGH (ref 70–99)
Total Protein: 7 g/dL (ref 6.0–8.3)

## 2012-06-05 LAB — PROTIME-INR
INR: 0.95 (ref 0.00–1.49)
Prothrombin Time: 12.9 seconds (ref 11.6–15.2)

## 2012-06-05 MED ORDER — RIVAROXABAN 15 MG PO TABS
15.0000 mg | ORAL_TABLET | Freq: Once | ORAL | Status: DC
  Filled 2012-06-05: qty 1

## 2012-06-05 MED ORDER — DOXYCYCLINE HYCLATE 100 MG PO TABS: 100.0000 mg | ORAL_TABLET | Freq: Two times a day (BID) | ORAL | Status: AC

## 2012-06-05 MED ORDER — RIVAROXABAN 15 MG PO TABS: 15.0000 mg | ORAL_TABLET | Freq: Every day | ORAL | Status: DC

## 2012-06-05 NOTE — Progress Notes (Signed)
  Subjective:    Patient ID: Nicole Mccann, female    DOB: 13-Dec-1945, 66 y.o.   MRN: [id number]  HPI Pt got IV dilantin on Tuesday and infusion was very painful.  Flushed line x3- 'very painful'.  Was d/c'd from hospital Wed.  Saturday veins on back of hand markedly swollen.  Had knot like area distal to thumb.  Swelling improved w/ ice and knot become less prominent but developed bruising.  Arm is now red, warm painful.  Difficult to move due to pain.   Review of Systems For ROS see HPI     Objective:   Physical Exam  Vitals reviewed. Constitutional: She appears well-developed and well-nourished. She appears distressed (obviously uncomfortable).  Cardiovascular: Intact distal pulses.   Skin: Skin is warm and dry. There is erythema (L arm w/ erythema from just below elbow to mid upper arm.  + induration, very painful.).          Superficial venous thrombus of L hand, + TTP w/ surrounding bruising          Assessment & Plan:

## 2012-06-05 NOTE — Assessment & Plan Note (Signed)
New.  Pt's arm most concerning for infxn and/or clot.  Start Doxy.  Get Dopplers to r/o DVT.  If + for clot will need to go to ER for anticoagulation options vs possible admission given pt's hx and likely superimposed infection.

## 2012-06-05 NOTE — Patient Instructions (Addendum)
We'll call you with your [place] results Start the Doxy tonight- take this twice daily w/ food ICE Call with any questions or concerns Hang in there!!!

## 2012-06-05 NOTE — ED Notes (Signed)
Pt was seen by pmd in [place] today for redness in left arm that started 4 days ago.  Had IV 6 days ago at [facility] where she was participating in study on seizures.  PMD sent pt here for [place] of Left arm.  Confirmed presence of clot.

## 2012-06-05 NOTE — ED Provider Notes (Signed)
History    This chart was scribed for Gerhard Munch, MD, MD by Smitty Pluck. The patient was seen in room MH02 and the patient's care was started at 5:09PM.   CSN: [id number]  Arrival date & time 06/05/12  1639   First MD Initiated Contact with Patient 06/05/12 1650      Chief Complaint  Patient presents with  . DVT    (Consider location/radiation/quality/duration/timing/severity/associated sxs/prior treatment) The history is provided by the patient and the spouse.   Nicole Mccann is a 66 y.o. female who presents to the Emergency Department complaining of DVT in left arm, moderate left arm, and left axilla pain and redness onset 6 days ago. Pt was seen at PCP office today for similar pain and was sent here for ultrasound of left arm. Pt was at [facility] and had seizure studies along with IV 1 week ago. Pain has been constant since then. Pt reports having clot 2 days ago in forearm. Pt reports taking asa.   Past Medical History  Diagnosis Date  . Hyperlipidemia   . Allergy   . Diabetes mellitus   . Asthma     Past Surgical History  Procedure Date  . Appendectomy   . Tonsillectomy    . Cholecystectomy   . Colonoscopy 2006    Family History  Problem Relation Age of Onset  . Cancer Father     stomach cancer  . Cancer Sister     breast cancer  . Diabetes Maternal Aunt   . Diabetes Paternal Aunt   . Coronary artery disease Father     History  Substance Use Topics  . Smoking status: Never Smoker   . Smokeless tobacco: Never Used  . Alcohol Use: 4.2 oz/week    7 Glasses of wine per week    OB History    Grav Para Term Preterm Abortions TAB SAB Ect Mult Living                  Review of Systems  Constitutional:       HPI  HENT:       HPI otherwise negative  Eyes: Negative.   Respiratory:       HPI, otherwise negative  Cardiovascular:       HPI, otherwise nmegative  Gastrointestinal: Negative for vomiting.  Genitourinary:       HPI,  otherwise negative  Musculoskeletal:       HPI, otherwise negative  Skin: Negative.   Neurological: Negative for syncope.    Allergies  Mushroom ext cmplx(shiitake-reishi-mait); Shellfish allergy; Acetaminophen; Butalbital-aspirin-caffeine; Clarithromycin; Fluticasone-salmeterol; Montelukast sodium; Moxifloxacin; Orphenadrine citrate; Penicillins; and Sulfonamide derivatives  Home Medications   Current Outpatient Rx  Name Route Sig Dispense Refill  . ALBUTEROL SULFATE HFA 108 (90 BASE) MCG/ACT IN AERS Inhalation Inhale 2 puffs into the lungs every 4 (four) hours as needed for wheezing. 1 Inhaler 0  . ASPIRIN 81 MG PO TABS Oral Take 81 mg by mouth daily.      . BUPROPION HCL ER (SR) 150 MG PO TB12 Oral Take 1 tablet (150 mg total) by mouth 2 (two) times daily. 180 tablet 1  . BUPROPION HCL ER (SR) 150 MG PO TB12 Oral Take 1 tablet (150 mg total) by mouth daily. 180 tablet 1  . VITAMIN D3 2000 UNITS PO TABS Oral Take 2,000 Units by mouth daily.     Marland Kitchen DOXYCYCLINE MONOHYDRATE 100 MG PO TABS Oral Take 100 mg by mouth 2 (two) times daily.      Marland Kitchen  DOXYCYCLINE HYCLATE 100 MG PO TABS Oral Take 1 tablet (100 mg total) by mouth 2 (two) times daily. 20 tablet 0  . FLUTICASONE PROPIONATE 50 MCG/ACT NA SUSP Nasal Place 1 spray into the nose 2 (two) times daily as needed for rhinitis. 16 g 2  . IBUPROFEN 200 MG PO TABS Oral Take 400 mg by mouth every 6 (six) hours as needed. For pain     . IPRATROPIUM-ALBUTEROL 0.5-2.5 (3) MG/3ML IN SOLN Nebulization Take 3 mLs by nebulization every 6 (six) hours as needed. 360 mL 1  . LEVETIRACETAM 500 MG PO TABS Oral Take 500 mg by mouth every 12 (twelve) hours.    Marland Kitchen LORATADINE 10 MG PO TABS Oral Take 10 mg by mouth daily.      Marland Kitchen ONE-DAILY MULTI VITAMINS PO TABS Oral Take 1 tablet by mouth daily.      . ORLISTAT 60 MG PO CAPS Oral Take 60 mg by mouth daily.      Marland Kitchen SITAGLIPTIN-METFORMIN HCL 50-500 MG PO TABS Oral Take 0.5 tablets by mouth daily as needed. Take if sugar  over 135      BP 150/73  Pulse 73  Temp 98 F (36.7 C) (Oral)  Resp 16  Ht 5' (1.524 m)  Wt 257 lb (116.574 kg)  BMI 50.19 kg/m2  SpO2 98%  Physical Exam  Nursing note and vitals reviewed. Constitutional: She is oriented to person, place, and time. She appears well-developed and well-nourished.  HENT:  Head: Normocephalic and atraumatic.  Eyes: Conjunctivae and EOM are normal. Pupils are equal, round, and reactive to light.  Neck: Normal range of motion. Neck supple.  Cardiovascular: Normal rate and regular rhythm.        Good distal pulses     Pulmonary/Chest: Effort normal and breath sounds normal.  Abdominal: Soft. Bowel sounds are normal.  Musculoskeletal: Normal range of motion.       Tender hematoma at base of left thumb Medial left arm tenderness  Indurated with ecchymosis of left thumb   Neurological: She is alert and oriented to person, place, and time.  Skin: Skin is warm and dry.  Psychiatric: She has a normal mood and affect.    ED Course  Procedures (including critical care time) DIAGNOSTIC STUDIES: Oxygen Saturation is 98% on room air, normal by my interpretation.    COORDINATION OF CARE: 5:18PM EDP discusses pt ED treatment with pt  6:10PM EDP rechecks pt and discusses lab results with pt.     Labs Reviewed  COMPREHENSIVE METABOLIC PANEL - Abnormal; Notable for the following:    Glucose, Bld 113 (*)     Total Bilirubin 0.2 (*)     GFR calc non Af Amer 74 (*)     GFR calc Af Amer 86 (*)     All other components within normal limits  CBC WITH DIFFERENTIAL  PROTIME-INR   US Venous Img Upper Uni Left  06/05/2012  *RADIOLOGY REPORT*  Clinical Data: Anterior and lateral left arm pain, redness and swelling from the elbow to the mid upper arm.  Medication administration through an  IV in that arm 1 week ago.  LEFT UPPER EXTREMITY VENOUS DUPLEX ULTRASOUND  Technique:  Gray-scale sonography with graded compression, as well as color Doppler and duplex  ultrasound were performed to evaluate the upper extremity deep venous system from the level of the subclavian vein and including the jugular, axillary, basilic and upper cephalic vein.  Spectral Doppler was utilized to evaluate flow at rest  and with distal augmentation maneuvers.  Comparison:  None.  Findings: The left cephalic vein from the elbow to the mid upper arm is dilated and filled with hypoechoic material.  No internal blood flow is seen with color Doppler or duplex Doppler.  The remainder of the examined veins have normal appearances and demonstrate normal compressibility and augmentation.  IMPRESSION: Acute deep venous thrombosis involving the cephalic vein from the elbow to the mid upper arm.  These results will be called to the ordering clinician or representative by the Radiologist technologist, and communication documented in the PACS Dashboard.  Original Report Authenticated By: Darrol Angel, M.D.     No diagnosis found.    MDM  I personally performed the services described in this documentation, which was scribed in my presence. The recorded information has been reviewed and considered.  This pleasant female presents with new left arm swelling and pain, and an ultrasound confirming presence of a DVT in the cephalic vein.  The denial of any chest pain, dyspnea, the absence of hypoxia tachypnea tachycardia his reassuring for the low suspicion of thromboembolism.  The patient's labs were unremarkable.  We discussed possibilities for anticoagulation, and consideration of not anticoagulating at all.  The patient elected to have a sickly patient started.  She elected to have Xarelto therapy, rather than Lovenox/Coumadin.  We discussed risks, benefits, including increased susceptibility to GI bleed.  The patient and her husband stated understanding of this, and the patient was discharged with instructions to follow up with her physicians as possible for medication reconciliation and  consideration of her new medical condition.  Gerhard Munch, MD 06/05/12 [phone]

## 2012-06-07 ENCOUNTER — Ambulatory Visit (INDEPENDENT_AMBULATORY_CARE_PROVIDER_SITE_OTHER): Payer: Medicare Other | Admitting: Internal Medicine

## 2012-06-07 ENCOUNTER — Encounter: Payer: Self-pay | Admitting: Internal Medicine

## 2012-06-07 VITALS — BP 122/80 | HR 82 | Temp 98.6°F | Wt 254.4 lb

## 2012-06-07 DIAGNOSIS — R569 Unspecified convulsions: Secondary | ICD-10-CM | POA: Insufficient documentation

## 2012-06-07 DIAGNOSIS — T801XXA Vascular complications following infusion, transfusion and therapeutic injection, initial encounter: Secondary | ICD-10-CM

## 2012-06-07 DIAGNOSIS — I998 Other disorder of circulatory system: Secondary | ICD-10-CM

## 2012-06-07 DIAGNOSIS — Y842 Radiological procedure and radiotherapy as the cause of abnormal reaction of the patient, or of later complication, without mention of misadventure at the time of the procedure: Secondary | ICD-10-CM

## 2012-06-07 HISTORY — DX: Unspecified convulsions: R56.9

## 2012-06-07 MED ORDER — RIVAROXABAN 15 MG PO TABS: ORAL_TABLET | ORAL | Status: DC

## 2012-06-07 NOTE — Patient Instructions (Addendum)
Elevation is critical for the swelling to resolve. Use warm moist compresses 2- 3 times a day as discussed.  Please  schedule followup in 5 weeks

## 2012-06-07 NOTE — Progress Notes (Signed)
  Subjective:    Patient ID: Nicole Mccann, female    DOB: 1945/11/19, 66 y.o.   MRN: [id number]  HPI the sequence of events was reviewed in detail in the problem list updated with the description of the atypical seizures and the deep venous thrombosis. She is on Xarelto 15 mg daily at this time. She is also on Levetiraceta 500 mg twice a day for the  atypical seizures with subjective response to date.  There is no personal or family history of deep venous thrombosis or pulmonary thromboemboli    Review of Systems She denies fever, chills, sweats, chest pain, shortness of breath.  She has noticed prominence of the veins of the dorsum of the left hand since the acute event. She states that the erythema of the left upper extremity has improved without specific treatment     Objective:   Physical Exam She appears well-nourished in no acute distress  She has no lymphadenopathy about the neck, axilla, or epitrochlear areas.  There is 14 X 10 cm erythema over the left lateral olecranon area above and below the antecubital crease. She does experience pain with range of motion of the left upper extremity.  She has an S4 with no significant murmurs  Chest is clear with no wheezing, rhonchi, or increased work of breathing.        Assessment & Plan:  #1 cephalic vein deep venous thrombosis left upper extremity; this is most likely due to to the  direct irritative effect of the Dilantin intravenously. 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 I would recommend elevation and warm compresses to the left upper extremity twice a day

## 2012-06-19 ENCOUNTER — Other Ambulatory Visit: Payer: Self-pay

## 2012-06-19 DIAGNOSIS — T801XXA Vascular complications following infusion, transfusion and therapeutic injection, initial encounter: Secondary | ICD-10-CM

## 2012-06-19 MED ORDER — RIVAROXABAN 15 MG PO TABS: ORAL_TABLET | ORAL | Status: DC

## 2012-07-07 ENCOUNTER — Inpatient Hospital Stay (HOSPITAL_BASED_OUTPATIENT_CLINIC_OR_DEPARTMENT_OTHER)
Admission: EM | Admit: 2012-07-07 | Discharge: 2012-07-09 | DRG: 299 | Disposition: A | Discharge status: Home / Self Care | Payer: Medicare Other | Attending: Internal Medicine | Admitting: Internal Medicine

## 2012-07-07 ENCOUNTER — Encounter (HOSPITAL_BASED_OUTPATIENT_CLINIC_OR_DEPARTMENT_OTHER): Payer: Self-pay | Admitting: *Deleted

## 2012-07-07 ENCOUNTER — Inpatient Hospital Stay (HOSPITAL_COMMUNITY): Payer: Medicare Other

## 2012-07-07 ENCOUNTER — Telehealth: Payer: Self-pay | Admitting: Physician Assistant

## 2012-07-07 ENCOUNTER — Emergency Department (HOSPITAL_BASED_OUTPATIENT_CLINIC_OR_DEPARTMENT_OTHER): Payer: Medicare Other

## 2012-07-07 DIAGNOSIS — Z880 Allergy status to penicillin: Secondary | ICD-10-CM

## 2012-07-07 DIAGNOSIS — Z9089 Acquired absence of other organs: Secondary | ICD-10-CM

## 2012-07-07 DIAGNOSIS — Z882 Allergy status to sulfonamides status: Secondary | ICD-10-CM

## 2012-07-07 DIAGNOSIS — R071 Chest pain on breathing: Secondary | ICD-10-CM

## 2012-07-07 DIAGNOSIS — R0781 Pleurodynia: Secondary | ICD-10-CM

## 2012-07-07 DIAGNOSIS — J45909 Unspecified asthma, uncomplicated: Secondary | ICD-10-CM | POA: Diagnosis present

## 2012-07-07 DIAGNOSIS — Z888 Allergy status to other drugs, medicaments and biological substances status: Secondary | ICD-10-CM

## 2012-07-07 DIAGNOSIS — E785 Hyperlipidemia, unspecified: Secondary | ICD-10-CM | POA: Diagnosis present

## 2012-07-07 DIAGNOSIS — R0989 Other specified symptoms and signs involving the circulatory and respiratory systems: Secondary | ICD-10-CM

## 2012-07-07 DIAGNOSIS — E119 Type 2 diabetes mellitus without complications: Secondary | ICD-10-CM | POA: Diagnosis present

## 2012-07-07 DIAGNOSIS — I82409 Acute embolism and thrombosis of unspecified deep veins of unspecified lower extremity: Secondary | ICD-10-CM | POA: Diagnosis present

## 2012-07-07 DIAGNOSIS — I2699 Other pulmonary embolism without acute cor pulmonale: Secondary | ICD-10-CM | POA: Clinically undetermined

## 2012-07-07 DIAGNOSIS — I82619 Acute embolism and thrombosis of superficial veins of unspecified upper extremity: Principal | ICD-10-CM | POA: Diagnosis present

## 2012-07-07 DIAGNOSIS — Z7902 Long term (current) use of antithrombotics/antiplatelets: Secondary | ICD-10-CM

## 2012-07-07 DIAGNOSIS — G40909 Epilepsy, unspecified, not intractable, without status epilepticus: Secondary | ICD-10-CM | POA: Diagnosis present

## 2012-07-07 DIAGNOSIS — R0600 Dyspnea, unspecified: Secondary | ICD-10-CM

## 2012-07-07 DIAGNOSIS — Z881 Allergy status to other antibiotic agents status: Secondary | ICD-10-CM

## 2012-07-07 DIAGNOSIS — R0689 Other abnormalities of breathing: Secondary | ICD-10-CM | POA: Diagnosis present

## 2012-07-07 DIAGNOSIS — E669 Obesity, unspecified: Secondary | ICD-10-CM | POA: Diagnosis present

## 2012-07-07 DIAGNOSIS — E1142 Type 2 diabetes mellitus with diabetic polyneuropathy: Secondary | ICD-10-CM | POA: Diagnosis present

## 2012-07-07 DIAGNOSIS — Z6841 Body Mass Index (BMI) 40.0 and over, adult: Secondary | ICD-10-CM

## 2012-07-07 DIAGNOSIS — R569 Unspecified convulsions: Secondary | ICD-10-CM

## 2012-07-07 DIAGNOSIS — Z8249 Family history of ischemic heart disease and other diseases of the circulatory system: Secondary | ICD-10-CM

## 2012-07-07 DIAGNOSIS — Z79899 Other long term (current) drug therapy: Secondary | ICD-10-CM

## 2012-07-07 HISTORY — DX: Parosmia: R43.1

## 2012-07-07 HISTORY — DX: Acute embolism and thrombosis of unspecified deep veins of unspecified lower extremity: I82.409

## 2012-07-07 LAB — CBC WITH DIFFERENTIAL/PLATELET
Basophils Absolute: 0 10*3/uL (ref 0.0–0.1)
Basophils Relative: 0 % (ref 0–1)
Eosinophils Absolute: 0.3 10*3/uL (ref 0.0–0.7)
MCH: 30.8 pg (ref 26.0–34.0)
MCHC: 33.9 g/dL (ref 30.0–36.0)
Neutro Abs: 4.8 10*3/uL (ref 1.7–7.7)
Neutrophils Relative %: 54 % (ref 43–77)
Platelets: 219 10*3/uL (ref 150–400)

## 2012-07-07 LAB — COMPREHENSIVE METABOLIC PANEL
ALT: 22 U/L (ref 0–35)
AST: 18 U/L (ref 0–37)
Albumin: 3.9 g/dL (ref 3.5–5.2)
Alkaline Phosphatase: 62 U/L (ref 39–117)
Potassium: 4.8 mEq/L (ref 3.5–5.1)
Sodium: 139 mEq/L (ref 135–145)
Total Protein: 6.7 g/dL (ref 6.0–8.3)

## 2012-07-07 LAB — GLUCOSE, CAPILLARY: Glucose-Capillary: 99 mg/dL (ref 70–99)

## 2012-07-07 MED ORDER — ZOLPIDEM TARTRATE 5 MG PO TABS: 5.0000 mg | ORAL_TABLET | Freq: Every evening | ORAL | Status: DC | PRN

## 2012-07-07 MED ORDER — XENON XE 133 GAS
20.0000 | GAS_FOR_INHALATION | Freq: Once | RESPIRATORY_TRACT | Status: AC | PRN
  Administered 2012-07-07: 20 via RESPIRATORY_TRACT

## 2012-07-07 MED ORDER — SODIUM CHLORIDE 0.9 % IV SOLN: 250.0000 mL | INTRAVENOUS | Status: DC | PRN

## 2012-07-07 MED ORDER — ALUM & MAG HYDROXIDE-SIMETH 200-200-20 MG/5ML PO SUSP: 30.0000 mL | Freq: Four times a day (QID) | ORAL | Status: DC | PRN

## 2012-07-07 MED ORDER — OXYCODONE HCL 5 MG PO TABS: 5.0000 mg | ORAL_TABLET | ORAL | Status: DC | PRN

## 2012-07-07 MED ORDER — ONDANSETRON HCL 4 MG/2ML IJ SOLN: 4.0000 mg | Freq: Four times a day (QID) | INTRAMUSCULAR | Status: DC | PRN

## 2012-07-07 MED ORDER — TECHNETIUM TO 99M ALBUMIN AGGREGATED
3.0000 | Freq: Once | INTRAVENOUS | Status: AC | PRN
  Administered 2012-07-07: 3 via INTRAVENOUS

## 2012-07-07 MED ORDER — SODIUM CHLORIDE 0.9 % IJ SOLN
3.0000 mL | Freq: Two times a day (BID) | INTRAMUSCULAR | Status: DC
  Administered 2012-07-08: 3 mL via INTRAVENOUS

## 2012-07-07 MED ORDER — HYDROMORPHONE HCL PF 1 MG/ML IJ SOLN: 0.5000 mg | INTRAMUSCULAR | Status: DC | PRN

## 2012-07-07 MED ORDER — SODIUM CHLORIDE 0.9 % IJ SOLN: 3.0000 mL | INTRAMUSCULAR | Status: DC | PRN

## 2012-07-07 MED ORDER — SODIUM CHLORIDE 0.9 % IJ SOLN
3.0000 mL | Freq: Two times a day (BID) | INTRAMUSCULAR | Status: DC
  Administered 2012-07-08 (×2): 3 mL via INTRAVENOUS

## 2012-07-07 MED ORDER — INSULIN ASPART 100 UNIT/ML ~~LOC~~ SOLN: 0.0000 [IU] | Freq: Every day | SUBCUTANEOUS | Status: DC

## 2012-07-07 MED ORDER — HEPARIN (PORCINE) IN NACL 100-0.45 UNIT/ML-% IJ SOLN
1350.0000 [IU]/h | INTRAMUSCULAR | Status: AC
  Administered 2012-07-08: 1350 [IU]/h via INTRAVENOUS
  Filled 2012-07-07: qty 250

## 2012-07-07 MED ORDER — ALBUTEROL SULFATE (5 MG/ML) 0.5% IN NEBU: 2.5000 mg | INHALATION_SOLUTION | Freq: Four times a day (QID) | RESPIRATORY_TRACT | Status: DC | PRN

## 2012-07-07 MED ORDER — ONDANSETRON HCL 4 MG PO TABS: 4.0000 mg | ORAL_TABLET | Freq: Four times a day (QID) | ORAL | Status: DC | PRN

## 2012-07-07 MED ORDER — INSULIN ASPART 100 UNIT/ML ~~LOC~~ SOLN: 0.0000 [IU] | Freq: Three times a day (TID) | SUBCUTANEOUS | Status: DC

## 2012-07-07 NOTE — Progress Notes (Signed)
ANTICOAGULATION CONSULT NOTE - Initial Consult  Pharmacy Consult for Heparin Indication: DVT  Allergies  Allergen Reactions  . Mushroom Ext Cmplx(Shiitake-Reishi-Mait) Anaphylaxis  . Shellfish Allergy Anaphylaxis  . Sulfonamide Derivatives Anaphylaxis    unknown  . Acetaminophen     REACTION: STOMACH DISCOMFORT  . Butalbital-Aspirin-Caffeine     unknown  . Clarithromycin Nausea And Vomiting  . Fluticasone-Salmeterol     Feels like something in the throat  . Montelukast Sodium     unknown  . Moxifloxacin     unknown  . Orphenadrine Citrate     unknown  . Penicillins     unknown    Patient Measurements: Height: 5\' 1"  (154.9 cm) Weight: 243 lb 2.7 oz (110.3 kg) IBW/kg (Calculated) : 47.8  Heparin Dosing Weight: 74.9 kg  Vital Signs: Temp: 98.4 F (36.9 C) (08/30 1940) Temp src: Oral (08/30 1215) BP: 144/91 mmHg (08/30 1940) Pulse Rate: 69  (08/30 1940)  Labs:  Basename 07/07/12 1355  HGB 14.9  HCT 44.0  PLT 219  APTT --  LABPROT --  INR --  HEPARINUNFRC --  CREATININE 0.80  CKTOTAL --  CKMB --  TROPONINI --    Estimated Creatinine Clearance: 77.4 ml/min (by C-G formula based on Cr of 0.8).   Medical History: Past Medical History  Diagnosis Date  . Hyperlipidemia   . Allergy   . Diabetes mellitus   . Asthma   . Atypical seizure 06/07/2012  . DVT (deep venous thrombosis)   . Olfactory Aura     Medications:  Prescriptions prior to admission  Medication Sig Dispense Refill  . buPROPion (WELLBUTRIN SR) 150 MG 12 hr tablet Take 150 mg by mouth daily.      . Calcium Carbonate-Vit D-Min (CALCIUM 1200 PO) Take by mouth daily.      Marland Kitchen levETIRAcetam (KEPPRA) 500 MG tablet Take 500 mg by mouth 2 (two) times daily.      Marland Kitchen loratadine (CLARITIN) 10 MG tablet Take 10 mg by mouth daily.        . Multiple Vitamin (MULTIVITAMIN) tablet Take 1 tablet by mouth daily.        . Rivaroxaban (XARELTO) 15 MG TABS tablet 1 bid  40 tablet  0  . fluticasone (FLONASE) 50  MCG/ACT nasal spray Place 1 spray into the nose 2 (two) times daily as needed for rhinitis.  16 g  2  . Linagliptin-Metformin HCl (JENTADUETO) 2.5-500 MG TABS Take 1 tablet by mouth. TAKE ONLY IF BS ABOVE 150 FASTING      . orlistat (ALLI) 60 MG capsule Take 60 mg by mouth daily.          Assessment: 66 yo female who had LUE thrombosis in July and was started on Xarelto. She presented to the ED with increased LUE pain and SOB. A repeat doppler found that the thrombus has enlarged. V/Q scan is pending. Pharmacy consulted to begin heparin for presumed Xarelto failure. Baseline CBC is normal.   I spoke with the patient and she reports her last dose of Xarelto was this morning at 09:00 or 09:30. She states she has taken the Xarelto twice daily as prescribed and has not missed any doses.  Goal of Therapy:  Heparin level 0.3-0.7 units/ml Monitor platelets by anticoagulation protocol: Yes   Plan:  -Begin heparin drips at 1350 units/hr -Heparin level 6 hours after started -Daily heparin level and CBC while on heparin   [facility], [place].D., BCPS Clinical Pharmacist Pager: [phone] 07/07/2012 9:40 PM

## 2012-07-07 NOTE — ED Provider Notes (Addendum)
History     CSN: [id number]  Arrival date & time 07/07/12  1204   First MD Initiated Contact with Patient 07/07/12 1327      Chief Complaint  Patient presents with  . DVT    (Consider location/radiation/quality/duration/timing/severity/associated sxs/prior treatment) HPI Comments: The patient presents with pain and swelling to the left forearm.  She was diagnosed one month ago with a dvt after she was infused with dilantin.  She is currently on xarelto for this.  She started again this morning with pain and swelling in the left forearm and bicep area.    The history is provided by the patient.    Past Medical History  Diagnosis Date  . Hyperlipidemia   . Allergy   . Diabetes mellitus   . Asthma   . Atypical seizure 06/07/2012  . DVT (deep venous thrombosis)   . Olfactory Aura     Past Surgical History  Procedure Date  . Appendectomy   . Tonsillectomy    . Cholecystectomy   . Colonoscopy 2006    Family History  Problem Relation Age of Onset  . Cancer Father     stomach cancer  . Cancer Sister     breast cancer  . Diabetes Maternal Aunt   . Diabetes Paternal Aunt   . Coronary artery disease Father     History  Substance Use Topics  . Smoking status: Never Smoker   . Smokeless tobacco: Never Used  . Alcohol Use: 4.2 oz/week    7 Glasses of wine per week    OB History    Grav Para Term Preterm Abortions TAB SAB Ect Mult Living                  Review of Systems  All other systems reviewed and are negative.    Allergies  Mushroom ext cmplx(shiitake-reishi-mait); Shellfish allergy; Sulfonamide derivatives; Acetaminophen; Butalbital-aspirin-caffeine; Clarithromycin; Fluticasone-salmeterol; Montelukast sodium; Moxifloxacin; Orphenadrine citrate; and Penicillins  Home Medications   Current Outpatient Rx  Name Route Sig Dispense Refill  . BUPROPION HCL ER (SR) 150 MG PO TB12 Oral Take 150 mg by mouth daily.    Marland Kitchen CALCIUM 1200 PO Oral Take by mouth  daily.    Marland Kitchen FLUTICASONE PROPIONATE 50 MCG/ACT NA SUSP Nasal Place 1 spray into the nose 2 (two) times daily as needed for rhinitis. 16 g 2  . LEVETIRACETAM 500 MG PO TABS Oral Take 500 mg by mouth 2 (two) times daily.    Marland Kitchen LINAGLIPTIN-METFORMIN HCL 2.5-500 MG PO TABS Oral Take 1 tablet by mouth. TAKE ONLY IF BS ABOVE 150 FASTING    . LORATADINE 10 MG PO TABS Oral Take 10 mg by mouth daily.      Marland Kitchen ONE-DAILY MULTI VITAMINS PO TABS Oral Take 1 tablet by mouth daily.      . ORLISTAT 60 MG PO CAPS Oral Take 60 mg by mouth daily.      Marland Kitchen RIVAROXABAN 15 MG PO TABS  1 bid 40 tablet 0    BP 143/82  Pulse 64  Temp 98.3 F (36.8 C) (Oral)  Resp 24  Ht 5\' 1"  (1.549 m)  Wt 250 lb (113.399 kg)  BMI 47.24 kg/m2  SpO2 98%  Physical Exam  Nursing note and vitals reviewed. Constitutional: She is oriented to person, place, and time. She appears well-developed and well-nourished.  HENT:  Head: Normocephalic and atraumatic.  Neck: Normal range of motion. Neck supple.  Cardiovascular: Normal rate and regular rhythm.   No  murmur heard. Pulmonary/Chest: Effort normal and breath sounds normal. No respiratory distress. She has no wheezes.  Abdominal: Soft. Bowel sounds are normal. She exhibits no distension. There is no tenderness.  Musculoskeletal: Normal range of motion. She exhibits no edema.  Neurological: She is alert and oriented to person, place, and time.  Skin: Skin is warm and dry.    ED Course  Procedures (including critical care time)  Labs Reviewed - No data to display No results found.   No diagnosis found.    MDM  The patient presents here with left arm pain.  She was diagnosed with dvt one month ago and has been treated with xarelto since that time.  She presents today with increasing pain in her arm and is also complaining of shortness of breath and pain in the back of her chest.  I repeated the ultrasound and it shows an enlarging clot in the cephalic vein with no flow seen. I  had planned to perform a ct angio of the chest to rule out dvt however this patient has an iv contrast allergy.  I believe she needs a V/Q scan and started on an alternative anticoagulation agent.  I will consult medicine as we do not Nuclear Medicine here at [facility].  After speaking with [facility], they have agree to accept her in transfer.  We will speak with pharmacy about starting a heparin drip, whether with or without a bolus.  I have spoken with pharmacy.  Their recommendation is to not start heparin until 12 hours after the last xarelto dose.  This would be at 9PM tonight.  Will transfer to [facility], to start heparin this evening if [facility] is in agreement.           Geoffery Lyons, MD 07/07/12 1607  Geoffery Lyons, MD 07/07/12 [phone]

## 2012-07-07 NOTE — ED Notes (Signed)
Report given to  [facility] on 2000 and also given to [facility]

## 2012-07-07 NOTE — ED Notes (Signed)
Returned from ultrasound.  No change in pt condition.

## 2012-07-07 NOTE — Telephone Encounter (Signed)
  Call from [facility]:  Nicole Mccann, is a 66 y.o. female,   MRN: [id number]  -  DOB - February 21, 1946  Outpatient Primary MD for the patient is Marga Melnick, MD  PMH:  DM, HLD  66 yo female who developed LUE thrombosis in July and was placed on Xeralto.  Comes to [facility] complaining of increased LUE pain and now Shortness of breath.  EDP repeated doppler u/s and found that the thrombus has enlarged extending down to her elbow.   Notes from Dr. Frederik Pear office in July visit:   #1 cephalic vein deep venous thrombosis left upper extremity; this is most likely due to to the direct irritative effect of the Dilantin intravenously. Subjective improvement with the novel anticoagulant.  #2 atypical seizures, subjectively responsive to the new medication orally.  Plan: I recommend reassessment with vascular Doppler after 6 weeks of Xarelto 15 mg twice a day. I would recommend elevation and warm compresses to the left upper extremity twice a day  Today the patient is complaining of SOB.  She has a contrast allergy and needs a V/Q scan.  [facility] does not have capability to do a VQ scan.    Dr. Suzy Bouchard ([facility] EDP) is determining with pharmacy whether or not it is safe to start a heparin drip and will do so if appropriate before sending her to [place].  I have requested a telemetry bed for enlarging left upper extremity thrombus, failing Xeralto, with SOB.  Patient will need a Stat VQ scan when she arrives.   Algis Downs, PA-C [facility] Pager: [phone]

## 2012-07-07 NOTE — H&P (Signed)
[facility] History and Physical  KORTNE ALL ZOX:[id number] DOB: [birth year] DOA: 07/07/2012  Referring physician: EDP PCP: Marga Melnick, MD   Chief Complaint: SOB, and Chest Pain and increased Swelling and Pain in LUE  HPI: Nicole Mccann is a 66 y.o. female who presented to the ED at [facility] due to worsening SOB, pleuritic chest pain and increasing Left Arm swelling and pain since the AM.  She has a recent diagnosis of DVT in the LUE, and was placed on Xarelto therapy.  In the ED she was evaluated and had an US of the LUE done which revealed worsening or and increase in the size of the LUE DVT.  She was transferred for admission to [facility].      Review of Systems: The patient denies anorexia, fever, weight loss, vision loss, decreased hearing, hoarseness, syncope, peripheral edema, balance deficits, hemoptysis, abdominal pain, melena, hematochezia, severe indigestion/heartburn, hematuria, incontinence, genital sores, muscle weakness, suspicious skin lesions, transient blindness, difficulty walking, depression, unusual weight change, abnormal bleeding, enlarged lymph nodes, angioedema, and breast masses.   Past Medical History  Diagnosis Date  . Hyperlipidemia   . Allergy   . Diabetes mellitus   . Asthma   . Atypical seizure 06/07/2012  . DVT (deep venous thrombosis)   . Olfactory Aura    Past Surgical History  Procedure Date  . Appendectomy   . Tonsillectomy    . Cholecystectomy   . Colonoscopy 2006    HOME MEDS: Prior to Admission medications   Medication Sig Start Date End Date Taking? Authorizing Provider  buPROPion (WELLBUTRIN SR) 150 MG 12 hr tablet Take 150 mg by mouth daily. 05/22/12 05/22/13 Yes Pecola Lawless, MD  Calcium Carbonate-Vit D-Min (CALCIUM 1200 PO) Take by mouth daily.   Yes Historical Provider, MD  levETIRAcetam (KEPPRA) 500 MG tablet Take 500 mg by mouth 2 (two) times daily.   Yes Historical Provider, MD  loratadine (CLARITIN) 10  MG tablet Take 10 mg by mouth daily.     Yes Historical Provider, MD  Multiple Vitamin (MULTIVITAMIN) tablet Take 1 tablet by mouth daily.     Yes Historical Provider, MD  Rivaroxaban (XARELTO) 15 MG TABS tablet 1 bid 06/19/12  Yes Pecola Lawless, MD  fluticasone [facility]) 50 MCG/ACT nasal spray Place 1 spray into the nose 2 (two) times daily as needed for rhinitis. 11/12/11 11/11/12  Pecola Lawless, MD  Linagliptin-Metformin HCl (JENTADUETO) 2.5-500 MG TABS Take 1 tablet by mouth. TAKE ONLY IF BS ABOVE 150 FASTING    Historical Provider, MD  orlistat (ALLI) 60 MG capsule Take 60 mg by mouth daily.      Historical Provider, MD      Allergies  Allergen Reactions  . Mushroom Ext Cmplx(Shiitake-Reishi-Mait) Anaphylaxis  . Shellfish Allergy Anaphylaxis  . Sulfonamide Derivatives Anaphylaxis    unknown  . Acetaminophen     REACTION: STOMACH DISCOMFORT  . Butalbital-Aspirin-Caffeine     unknown  . Clarithromycin Nausea And Vomiting  . Fluticasone-Salmeterol     Feels like something in the throat  . Montelukast Sodium     unknown  . Moxifloxacin     unknown  . Orphenadrine Citrate     unknown  . Penicillins     unknown    Social History:  reports that she has never smoked. She has never used smokeless tobacco. She reports that she drinks about 4.2 ounces of alcohol per week. She reports that she does not use illicit drugs.  Family History  Problem Relation Age of Onset  . Cancer Father     stomach cancer  . Cancer Sister     breast cancer  . Diabetes Maternal Aunt   . Diabetes Paternal Aunt   . Coronary artery disease Father        GEN:  Pleasant Elderly 68 year old Obese  Femaleexamined  and in no acute distress; cooperative with exam Filed Vitals:   07/07/12 1458 07/07/12 1741 07/07/12 1808 07/07/12 1940  BP: 153/66 138/60  144/91  Pulse: 69 68  69  Temp:    98.4 F (36.9 C)  TempSrc:      Resp: 18   18  Height:    5\' 1"  (1.549 m)  Weight:    110.3 kg (243 lb  2.7 oz)  SpO2: 98% 100% 100% 99%   Blood pressure 144/91, pulse 69, temperature 98.4 F (36.9 C), temperature source Oral, resp. rate 18, height 5\' 1"  (1.549 m), weight 110.3 kg (243 lb 2.7 oz), SpO2 99.00%. PSYCH: She is alert and oriented x4; does not appear anxious does not appear depressed; affect is normal HEENT: Normocephalic and Atraumatic, Mucous membranes pink; PERRLA; EOM intact; Fundi:  Benign;  No scleral icterus, Nares: Patent, Oropharynx: Clear, Fair Dentition, Neck:  FROM, no cervical lymphadenopathy nor thyromegaly or carotid bruit; no JVD; Breasts:: Not examined CHEST WALL: No tenderness CHEST: Normal respiration, clear to auscultation bilaterally HEART: Regular rate and rhythm; no murmurs rubs or gallops BACK: No kyphosis or scoliosis; no CVA tenderness ABDOMEN: Positive Bowel Sounds, Scaphoid, Obese, soft non-tender; no masses, no organomegaly, no pannus; no intertriginous candida. Rectal Exam: Not done EXTREMITIES: No bone or joint deformity; age-appropriate arthropathy of the hands and knees; no cyanosis, clubbing or edema; no ulcerations. Genitalia: not examined PULSES: 2+ and symmetric SKIN: Normal hydration no rash or ulceration CNS: Cranial nerves 2-12 grossly intact no focal neurologic deficit   Labs on Admission:  Basic Metabolic Panel:  Lab 07/07/12 7829  NA 139  K 4.8  CL 102  CO2 30  GLUCOSE 100*  BUN 19  CREATININE 0.80  CALCIUM 9.6  MG --  PHOS --   Liver Function Tests:  Lab 07/07/12 1355  AST 18  ALT 22  ALKPHOS 62  BILITOT 0.3  PROT 6.7  ALBUMIN 3.9   No results found for this basename: LIPASE:5,AMYLASE:5 in the last 168 hours No results found for this basename: AMMONIA:5 in the last 168 hours CBC:  Lab 07/07/12 1355  WBC 8.9  NEUTROABS 4.8  HGB 14.9  HCT 44.0  MCV 91.1  PLT 219   Cardiac Enzymes: No results found for this basename: CKTOTAL:5,CKMB:5,CKMBINDEX:5,TROPONINI:5 in the last 168 hours  BNP (last 3 results) No  results found for this basename: PROBNP:3 in the last 8760 hours CBG: No results found for this basename: GLUCAP:5 in the last 168 hours  Radiological Exams on Admission: US Venous Img Upper Uni Left  07/07/2012  *RADIOLOGY REPORT*  Clinical Data: Left lateral upper arm pain.  Prior left cephalic vein thrombosis, currently on blood thinners.  UPPER LEFT VENOUS EXTREMITY ULTRASOUND  Technique:  Gray-scale sonography with compression, as well as color and duplex Doppler ultrasound, were performed to evaluate the deep venous system from the level of the common femoral vein through the popliteal and proximal calf veins.  Comparison: None.  Findings: Expected appearance of flow, augmentation, and (where feasible) phasicity and compressibility in the left internal jugular, subclavian, axillary, basilic, brachial, radial, and ulnar veins.  Thrombus is present in the cephalic vein from the upper forearm to the elbow, noncompressible, with no internal flow seen in this vicinity.  IMPRESSION:  1.  Continued thrombus in the cephalic vein, currently in the upper forearm and elbow region (previously in the elbow and upper arm).   Original Report Authenticated By: Dellia Cloud, M.D.          ADDENDUM:  V/Q Scan returned INTERMEDIATE PROBABILITY  Assessment: Principal Problem:  *Pleuritic chest pain Active Problems:  Dyspnea  DVT (deep venous thrombosis)  DIABETES MELLITUS, TYPE II  HYPERLIPIDEMIA NEC/NOS  ASTHMA   Plan:   Most Likely Acute Pulmonary Embolism based on symptoms, V/Q scan result, and evidence of extension of LUE Thrombus on Ultrasound.   Admit to Telemetry Bed Cardiac and O2 Monitoring Stat V/Q Scan, and IV Heparin Drip Reconcile Home Medications. SSI Coverage PRN.     Code Status: FULL CODE Family Communication: Husband at Bedside Disposition Plan:  Return to Home  Time spent:  60 minutes Ron Parker [facility] Pager [phone]  If 7PM-7AM, please  contact night-coverage [email or web address] Password [email or web address] 07/07/2012, 9:22 PM

## 2012-07-07 NOTE — ED Notes (Signed)
Patient transported to Ultrasound via w/c  

## 2012-07-07 NOTE — ED Notes (Signed)
Swelling left forearm since 1030 am today.  Recent history of blood clots in same arm one month ago.  States she developed sob while traveling to ed.  Hx of asthma, pt is very upset and crying.

## 2012-07-08 ENCOUNTER — Encounter (HOSPITAL_COMMUNITY): Payer: Self-pay

## 2012-07-08 DIAGNOSIS — R0989 Other specified symptoms and signs involving the circulatory and respiratory systems: Secondary | ICD-10-CM

## 2012-07-08 DIAGNOSIS — R569 Unspecified convulsions: Secondary | ICD-10-CM

## 2012-07-08 DIAGNOSIS — M79609 Pain in unspecified limb: Secondary | ICD-10-CM

## 2012-07-08 DIAGNOSIS — R0609 Other forms of dyspnea: Secondary | ICD-10-CM

## 2012-07-08 LAB — CBC
Hemoglobin: 14.1 g/dL (ref 12.0–15.0)
MCH: 30.3 pg (ref 26.0–34.0)
MCHC: 32.9 g/dL (ref 30.0–36.0)
Platelets: 189 10*3/uL (ref 150–400)
RBC: 4.65 MIL/uL (ref 3.87–5.11)

## 2012-07-08 LAB — GLUCOSE, CAPILLARY: Glucose-Capillary: 108 mg/dL — ABNORMAL HIGH (ref 70–99)

## 2012-07-08 LAB — BASIC METABOLIC PANEL
BUN: 14 mg/dL (ref 6–23)
Calcium: 8.9 mg/dL (ref 8.4–10.5)
GFR calc Af Amer: 85 mL/min — ABNORMAL LOW (ref >90)
GFR calc non Af Amer: 73 mL/min — ABNORMAL LOW (ref >90)
Potassium: 3.8 mEq/L (ref 3.5–5.1)
Sodium: 141 mEq/L (ref 135–145)

## 2012-07-08 LAB — HEMOGLOBIN A1C
Hgb A1c MFr Bld: 6.1 % — ABNORMAL HIGH (ref <5.7)
Hgb A1c MFr Bld: 6 % — ABNORMAL HIGH (ref <5.7)

## 2012-07-08 LAB — PROTIME-INR: Prothrombin Time: 13.5 seconds (ref 11.6–15.2)

## 2012-07-08 MED ORDER — WARFARIN - PHARMACIST DOSING INPATIENT
Freq: Every day | Status: DC
  Administered 2012-07-08: 18:00:00

## 2012-07-08 MED ORDER — WARFARIN SODIUM 7.5 MG PO TABS
7.5000 mg | ORAL_TABLET | Freq: Once | ORAL | Status: AC
  Administered 2012-07-08: 7.5 mg via ORAL
  Filled 2012-07-08: qty 1

## 2012-07-08 MED ORDER — ONE-DAILY MULTI VITAMINS PO TABS: 1.0000 | ORAL_TABLET | Freq: Every day | ORAL | Status: DC

## 2012-07-08 MED ORDER — COUMADIN BOOK
Freq: Once | Status: AC
  Administered 2012-07-08: 18:00:00
  Filled 2012-07-08: qty 1

## 2012-07-08 MED ORDER — BUPROPION HCL ER (SR) 150 MG PO TB12
150.0000 mg | ORAL_TABLET | Freq: Every day | ORAL | Status: DC
  Administered 2012-07-08: 150 mg via ORAL
  Filled 2012-07-08 (×2): qty 1

## 2012-07-08 MED ORDER — LEVETIRACETAM 500 MG PO TABS
500.0000 mg | ORAL_TABLET | Freq: Two times a day (BID) | ORAL | Status: DC
  Administered 2012-07-08 (×2): 500 mg via ORAL
  Filled 2012-07-08 (×4): qty 1

## 2012-07-08 MED ORDER — ADULT MULTIVITAMIN W/MINERALS CH
1.0000 | ORAL_TABLET | Freq: Every day | ORAL | Status: DC
  Filled 2012-07-08 (×2): qty 1

## 2012-07-08 MED ORDER — ENOXAPARIN SODIUM 120 MG/0.8ML ~~LOC~~ SOLN
110.0000 mg | Freq: Two times a day (BID) | SUBCUTANEOUS | Status: DC
  Administered 2012-07-08 – 2012-07-09 (×3): 110 mg via SUBCUTANEOUS
  Filled 2012-07-08 (×5): qty 0.8

## 2012-07-08 MED ORDER — WARFARIN VIDEO
Freq: Once | Status: AC
  Administered 2012-07-08: 18:00:00

## 2012-07-08 NOTE — Progress Notes (Signed)
Nutrition Brief Note  Patient identified on the Malnutrition Screening Tool (MST) report for unintended weight loss, generating a score of 4.   Body mass index is 45.95 kg/(m^2). Pt meets criteria for obesity class III, extreme obesity based on current BMI.   Current diet order is CHO modified, patient is consuming approximately 90% of meals at this time. Labs and medications reviewed.   Met with pt who reports excellent appetite PTA, 3 meals/day with snacks. Pt reports being surprised with RN stated her weight was down 10 pounds since last week, however RN stated this was likely r/t difference in scales compared to pt's at home. Pt requested education on healthy eating - provided healthy eating nutrition therapy handout and discussed basic healthy eating principles.   No nutrition interventions warranted at this time. If nutrition issues arise, please consult RD.   Dietitian# [phone]

## 2012-07-08 NOTE — Progress Notes (Signed)
ANTICOAGULATION CONSULT NOTE - Follow Up Consult  Pharmacy Consult for Lovenox and Coumadin Indication: LUE DVT, possible PE  Allergies  Allergen Reactions  . Mushroom Ext Cmplx(Shiitake-Reishi-Mait) Anaphylaxis  . Shellfish Allergy Anaphylaxis  . Sulfonamide Derivatives Anaphylaxis    unknown  . Acetaminophen     REACTION: STOMACH DISCOMFORT  . Butalbital-Aspirin-Caffeine     unknown  . Clarithromycin Nausea And Vomiting  . Fluticasone-Salmeterol     Feels like something in the throat  . Montelukast Sodium     unknown  . Moxifloxacin     unknown  . Orphenadrine Citrate     unknown  . Penicillins     unknown    Patient Measurements: Height: 5\' 1"  (154.9 cm) Weight: 243 lb 2.7 oz (110.3 kg) IBW/kg (Calculated) : 47.8  Heparin Dosing Weight:   Vital Signs: Temp: 97.6 F (36.4 C) (08/31 1343) Temp src: Oral (08/31 1343) BP: 133/62 mmHg (08/31 1343) Pulse Rate: 73  (08/31 1343)  Labs:  Basename 07/08/12 1546 07/08/12 0515 07/07/12 1355  HGB -- 14.1 14.9  HCT -- 42.9 44.0  PLT -- 189 219  APTT -- -- --  LABPROT 13.5 -- --  INR 1.01 -- --  HEPARINUNFRC -- 1.26* --  CREATININE -- 0.81 0.80  CKTOTAL -- -- --  CKMB -- -- --  TROPONINI -- -- --    Estimated Creatinine Clearance: 76.4 ml/min (by C-G formula based on Cr of 0.81).   Medications:  Lovenox 110mg  SQ q12h  Assessment: 66yof on Lovenox starting bridge to Coumadin (Day 1 of minimum 5 Day overlap) for LUE DVT and possible PE (VQ scan reported intermediate probability). Patient was previously on Xarelto - last dose 8/30 AM.  - Baseline INR 1.01 - H/H and Plts wnl - No significant bleeding reported - Weight: 110kg, CrCl 32ml/min - Warfarin points: 4  Goal of Therapy:  INR 2-3 Anti-Xa level 0.6-1.2 units/ml 4hrs after LMWH dose given Monitor platelets by anticoagulation protocol: Yes   Plan:  1. Continue Lovenox 110mg  SQ q12h 2. Coumadin 7.5mg  po x 1 today 3. Daily INR; CBC q72hr 4. Coumadin  educational book/video  Cleon Dew [phone] 07/08/2012,5:21 PM

## 2012-07-08 NOTE — Progress Notes (Signed)
Anticoagulation  CONSULT NOTE - FOLLOW UP  Pharmacy Consult for change from heparin to lovenox Indication: UE dvt   Allergies  Allergen Reactions  . Mushroom Ext Cmplx(Shiitake-Reishi-Mait) Anaphylaxis  . Shellfish Allergy Anaphylaxis  . Sulfonamide Derivatives Anaphylaxis    unknown  . Acetaminophen     REACTION: STOMACH DISCOMFORT  . Butalbital-Aspirin-Caffeine     unknown  . Clarithromycin Nausea And Vomiting  . Fluticasone-Salmeterol     Feels like something in the throat  . Montelukast Sodium     unknown  . Moxifloxacin     unknown  . Orphenadrine Citrate     unknown  . Penicillins     unknown    Patient Measurements: Height: 5\' 1"  (154.9 cm) Weight: 243 lb 2.7 oz (110.3 kg) IBW/kg (Calculated) : 47.8  Adjusted Body Weight:   Vital Signs: Temp: 97.3 F (36.3 C) (08/31 0529) Temp src: Oral (08/31 0529) BP: 110/72 mmHg (08/31 0529) Pulse Rate: 62  (08/31 0529) Intake/Output from previous day: 08/30 0701 - 08/31 0700 In: -  Out: 500 [Urine:500] Intake/Output from this shift: Total I/O In: -  Out: 500 [Urine:500]  Labs:  [facility] 07/08/12 0515 07/07/12 1355  WBC 8.4 8.9  HGB 14.1 14.9  PLT 189 219  LABCREA -- --  CREATININE -- 0.80   Estimated Creatinine Clearance: 77.4 ml/min (by C-G formula based on Cr of 0.8). No results found for this basename: VANCOTROUGH:2,VANCOPEAK:2,VANCORANDOM:2,GENTTROUGH:2,GENTPEAK:2,GENTRANDOM:2,TOBRATROUGH:2,TOBRAPEAK:2,TOBRARND:2,AMIKACINPEAK:2,AMIKACINTROU:2,AMIKACIN:2, in the last 72 hours   Microbiology: No results found for this or any previous visit (from the past 720 hour(s)).  Anti-infectives    None      Assessment: Xa level this am 1.26. Patient on xarelto as outpatient and apparently failed this despite compliance. Xa falsely elevated due to both UFhep and xarelto being Xa inhibitors. In order to prevent subtherapeutic levels we are going to proceed with lovenox full dose with more predictable dose profile  thereby preventing  low levels of UF heparin with inaccurately elevated Xa values.   Goal of Therapy:  lovenox full dose    Plan:   lovenox 110 mg q12h  (continue heparin until lovenox given then dc)  Cbc q72hours   Janice Coffin 07/08/2012,6:36 AM

## 2012-07-08 NOTE — Progress Notes (Addendum)
PCP: Marga Melnick, MD  Brief HPI: Nicole Mccann is a 66 y.o. female who presented to the ED at [facility] due to worsening SOB, pleuritic chest pain and increasing Left Arm swelling and pain. She has a recent diagnosis of DVT in the LUE, and was placed on Xarelto therapy. In the ED she was evaluated and had an US of the LUE done which revealed worsening or and increase in the size of the LUE DVT. She was transferred for admission to [facility].   Past medical history:  Past Medical History  Diagnosis Date  . Hyperlipidemia   . Allergy   . Diabetes mellitus   . Asthma   . Atypical seizure 06/07/2012  . DVT (deep venous thrombosis)   . Olfactory Aura     Consultants: None  Procedures: None  Subjective: Patient feels better. Still with some back pain on the left side. Denies shortness of breath. No cough. Complains of pain and swelling in left leg. Ambulating. Tolerating diet.  Objective: Vital signs in last 24 hours: Temp:  [97.3 F (36.3 C)-98.4 F (36.9 C)] 97.3 F (36.3 C) (08/31 0529) Pulse Rate:  [62-69] 62  (08/31 0529) Resp:  [18-24] 19  (08/31 0529) BP: (110-153)/(60-91) 110/72 mmHg (08/31 0529) SpO2:  [96 %-100 %] 98 % (08/31 0529) Weight:  [110.3 kg (243 lb 2.7 oz)-113.399 kg (250 lb)] 110.3 kg (243 lb 2.7 oz) (08/30 1940) Weight change:  Last BM Date: 07/07/12  Intake/Output from previous day: 08/30 0701 - 08/31 0700 In: -  Out: 500 [Urine:500] Intake/Output this shift:    General appearance: alert, cooperative, appears stated age and morbidly obese Head: Normocephalic, without obvious abnormality, atraumatic Resp: clear to auscultation bilaterally Cardio: regular rate and rhythm, S1, S2 normal, no murmur, click, rub or gallop GI: soft, non-tender; bowel sounds normal; no masses,  no organomegaly Extremities: left leg not larger compared to right. Calf was tender. No erythema. Pulses: 2+ and symmetric Skin: Skin color, texture, turgor normal.  No rashes or lesions Neurologic: Alert and oriented x 3. No focal deficits.  Lab Results:  Basename 07/08/12 0515 07/07/12 1355  WBC 8.4 8.9  HGB 14.1 14.9  HCT 42.9 44.0  PLT 189 219   BMET  Basename 07/08/12 0515 07/07/12 1355  NA 141 139  K 3.8 4.8  CL 104 102  CO2 29 30  GLUCOSE 108* 100*  BUN 14 19  CREATININE 0.81 0.80  CALCIUM 8.9 9.6  ALT -- 22    Studies/Results: Dg Chest 2 View  07/07/2012  *RADIOLOGY REPORT*  Clinical Data:  Shortness of breath.  CHEST - 2 VIEW  Comparison: 10/27/2011  Findings: The heart size and mediastinal contours are within normal limits.  Both lungs are clear.  The visualized skeletal structures are unremarkable.  IMPRESSION: No active disease.   Original Report Authenticated By: Reola Calkins, M.D.    Nm Pulmonary Perf And Vent  07/07/2012  *RADIOLOGY REPORT*  Clinical Data: Upper extremity DVT.  Shortness of breath.  NM PULMONARY VENTILATION AND PERFUSION SCAN  Radiopharmaceutical: CURIE MAA TECHNETIUM TO 74M ALBUMIN AGGREGATED, CURIE xenon xe 133 gas 20 milli Curie XENON XE 133 GAS  Comparison: Chest radiograph from 07/07/2012  Findings: On the anterior perfusion images there is a small perfusion defect within the periphery of the left upper lobe. Corresponding ventilation abnormality is noted on the washout images.  There are medium sized defects identified within each of the lung bases.  There is  normal ventilation identified in these areas.  IMPRESSION:  Intermediate probability for acute pulmonary embolus.   Original Report Authenticated By: Rosealee Albee, M.D.    US Venous Img Upper Uni Left  07/07/2012  *RADIOLOGY REPORT*  Clinical Data: Left lateral upper arm pain.  Prior left cephalic vein thrombosis, currently on blood thinners.  UPPER LEFT VENOUS EXTREMITY ULTRASOUND  Technique:  Gray-scale sonography with compression, as well as color and duplex Doppler ultrasound, were performed to evaluate the deep venous system  from the level of the common femoral vein through the popliteal and proximal calf veins.  Comparison: None.  Findings: Expected appearance of flow, augmentation, and (where feasible) phasicity and compressibility in the left internal jugular, subclavian, axillary, basilic, brachial, radial, and ulnar veins.  Thrombus is present in the cephalic vein from the upper forearm to the elbow, noncompressible, with no internal flow seen in this vicinity.  IMPRESSION:  1.  Continued thrombus in the cephalic vein, currently in the upper forearm and elbow region (previously in the elbow and upper arm).   Original Report Authenticated By: Dellia Cloud, M.D.     Medications:  Scheduled:   . enoxaparin (LOVENOX) injection  110 mg Subcutaneous Q12H  . insulin aspart  0-5 Units Subcutaneous QHS  . insulin aspart  0-9 Units Subcutaneous TID WC  . sodium chloride  3 mL Intravenous Q12H  . sodium chloride  3 mL Intravenous Q12H   Continuous:   . heparin 1,350 Units/hr (07/08/12 0015)   WUJ:WJXBJY chloride, albuterol, alum & mag hydroxide-simeth, HYDROmorphone (DILAUDID) injection, ondansetron (ZOFRAN) IV, ondansetron, oxyCODONE, sodium chloride, technetium albumin aggregated, xenon xe 133, zolpidem  Assessment/Plan:  Principal Problem:  *Pleuritic chest pain Active Problems:  DIABETES MELLITUS, TYPE II  HYPERLIPIDEMIA NEC/NOS  ASTHMA  Dyspnea  DVT (deep venous thrombosis)    Shortness of Breath/Chest Pain with recent LUE DVT with progression VQ scan was intermediate probability. She could have PE. I am not sure a CT after premedication will add much at this time or change management. Will get venous doppler of LLE as well. Will discuss with Hematology regarding further management once LE doppler is done. She appears to have failed Xarelto. The question remains as to need for IVC filter and further management of DVT. Currently she remains on Lovenox which I agree with. She may even need to go home  with long term Lovenox.  Seizure Disorder Continue with Keppra  Diabetes 2 Continue SSI. CBG well controlled  History of Asthma Stable  Code Status Full Code  DVT Prophylaxis On full dose Lovenox  Family Information Husband at bedside   LOS: 1 day   [facility]  [facility] Pager [phone] 07/08/2012, 8:48 AM   LE dopplers negative for DVT. Discussed with Dr. Cyndie Chime. He recommends Lovenox/Warfarin as she appears to have failed Xarelto. He also recommends hypercoagulable panel at later date.  Nicole Mccann 4:17 PM

## 2012-07-08 NOTE — Progress Notes (Signed)
Left:  No evidence of DVT, superficial thrombosis, or Baker's cyst.  Right:  Negative for DVT in the common femoral vein.  

## 2012-07-09 DIAGNOSIS — I2699 Other pulmonary embolism without acute cor pulmonale: Secondary | ICD-10-CM | POA: Clinically undetermined

## 2012-07-09 HISTORY — DX: Other pulmonary embolism without acute cor pulmonale: I26.99

## 2012-07-09 LAB — CBC
HCT: 44.7 % (ref 36.0–46.0)
Hemoglobin: 14.8 g/dL (ref 12.0–15.0)
MCV: 91.6 fL (ref 78.0–100.0)
Platelets: 194 10*3/uL (ref 150–400)
RBC: 4.88 MIL/uL (ref 3.87–5.11)
WBC: 7.7 10*3/uL (ref 4.0–10.5)

## 2012-07-09 LAB — GLUCOSE, CAPILLARY

## 2012-07-09 MED ORDER — WARFARIN SODIUM 2.5 MG PO TABS: ORAL_TABLET | ORAL | Status: DC

## 2012-07-09 MED ORDER — ENOXAPARIN (LOVENOX) PATIENT EDUCATION KIT
PACK | Freq: Once | Status: AC
  Administered 2012-07-09: 09:00:00
  Filled 2012-07-09: qty 1

## 2012-07-09 MED ORDER — ENOXAPARIN SODIUM 100 MG/ML ~~LOC~~ SOLN: 110.0000 mg | Freq: Two times a day (BID) | SUBCUTANEOUS | Status: DC

## 2012-07-09 NOTE — Progress Notes (Signed)
Pt discharge instructions and patient education complete. IV site d/c. Site WNL. No further questions. Lovenox education kit sent with patient and family. D/C home with husband. Nicole Mccann

## 2012-07-09 NOTE — Discharge Summary (Signed)
Physician Discharge Summary  Patient ID: Nicole Mccann MRN: [id number] DOB/AGE: 08-Sep-1946 66 y.o.  Admit date: 07/07/2012 Discharge date: 07/09/2012  PCP: Marga Melnick, MD  DISCHARGE DIAGNOSES:  Principal Problem:  *Pleuritic chest pain Active Problems:  DIABETES MELLITUS, TYPE II  HYPERLIPIDEMIA NEC/NOS  ASTHMA  Atypical seizure  Dyspnea  DVT (deep venous thrombosis)  Pulmonary embolism   RECOMMENDATIONS TO PCP: 1. Will need PT/INR on 9/3. 2. Will need hypercoagulable work up at some point. 3. Defer re-initiation of Orlistat to PCP in view of acute medical issues  DISCHARGE CONDITION: fair  INITIAL HISTORY: Nicole Mccann is a 66 y.o. female who presented to the ED at [facility] due to worsening SOB, pleuritic chest pain and increasing Left Arm swelling and pain. She has a recent diagnosis of DVT in the LUE, and was placed on Xarelto therapy. In the ED she was evaluated and had an US of the LUE done which revealed worsening or and increase in the size of the LUE DVT. She was transferred for admission to [facility].   HOSPITAL COURSE:   Shortness of Breath/Chest Pain with recent LUE DVT with progression  VQ scan was intermediate probability. She could have PE. I am not sure a CT after premedication will add much at this time or change management. Venous doppler of LE were negative for DVT. Discussed with Dr. Cyndie Chime. He recommends Lovenox/Warfarin as she appears to have failed Xarelto. He also recommends hypercoagulable panel at later date. There is no clear indication for IVC filter in this case. Patient and her husband were notified about all of the above. All of their questions were answered. Once we are able to confirm that she will be able to get Lovenox, she can be discharged home. She will need PT/INR on 9/3 at PCP office. Lovenox to be continued till 1 day after INR is therapeutic.   Seizure Disorder  This remained stable. She was continued on  Keppra.  Diabetes 2  Continue SSI. CBG well controlled. May resume home medications.  History of Asthma  Stable   Overall patient remains stable. She is saturating well on room air. She remains without chest pain or shortness of breath. She has been ambulating. She is stable for discharge.  IMAGING STUDIES Dg Chest 2 View  07/07/2012  *RADIOLOGY REPORT*  Clinical Data:  Shortness of breath.  CHEST - 2 VIEW  Comparison: 10/27/2011  Findings: The heart size and mediastinal contours are within normal limits.  Both lungs are clear.  The visualized skeletal structures are unremarkable.  IMPRESSION: No active disease.   Original Report Authenticated By: Reola Calkins, M.D.    Nm Pulmonary Perf And Vent  07/07/2012  *RADIOLOGY REPORT*  Clinical Data: Upper extremity DVT.  Shortness of breath.  NM PULMONARY VENTILATION AND PERFUSION SCAN  Radiopharmaceutical: CURIE MAA TECHNETIUM TO 20M ALBUMIN AGGREGATED, CURIE xenon xe 133 gas 20 milli Curie XENON XE 133 GAS  Comparison: Chest radiograph from 07/07/2012  Findings: On the anterior perfusion images there is a small perfusion defect within the periphery of the left upper lobe. Corresponding ventilation abnormality is noted on the washout images.  There are medium sized defects identified within each of the lung bases.  There is normal ventilation identified in these areas.  IMPRESSION:  Intermediate probability for acute pulmonary embolus.   Original Report Authenticated By: Rosealee Albee, M.D.    US Venous Img Upper Uni Left  07/07/2012  *RADIOLOGY REPORT*  Clinical  Data: Left lateral upper arm pain.  Prior left cephalic vein thrombosis, currently on blood thinners.  UPPER LEFT VENOUS EXTREMITY ULTRASOUND  Technique:  Gray-scale sonography with compression, as well as color and duplex Doppler ultrasound, were performed to evaluate the deep venous system from the level of the common femoral vein through the popliteal and proximal calf  veins.  Comparison: None.  Findings: Expected appearance of flow, augmentation, and (where feasible) phasicity and compressibility in the left internal jugular, subclavian, axillary, basilic, brachial, radial, and ulnar veins.  Thrombus is present in the cephalic vein from the upper forearm to the elbow, noncompressible, with no internal flow seen in this vicinity.  IMPRESSION:  1.  Continued thrombus in the cephalic vein, currently in the upper forearm and elbow region (previously in the elbow and upper arm).   Original Report Authenticated By: Dellia Cloud, M.D.     DISCHARGE EXAMINATION: Blood pressure 127/69, pulse 74, temperature 98.2 F (36.8 C), temperature source Oral, resp. rate 17, height 5\' 1"  (1.549 m), weight 110.3 kg (243 lb 2.7 oz), SpO2 98.00%. General appearance: alert, cooperative, appears stated age and no distress Resp: clear to auscultation bilaterally Cardio: regular rate and rhythm, S1, S2 normal, no murmur, click, rub or gallop GI: soft, non-tender; bowel sounds normal; no masses,  no organomegaly Extremities: extremities normal, atraumatic, no cyanosis or edema  DISPOSITION: Home  Discharge Orders    Future Appointments: Provider: Department: Dept Phone: Center:   07/12/2012 10:00 AM Pecola Lawless, MD [facility] [phone] [facility]   07/25/2012 9:30 AM Pecola Lawless, MD [facility] [phone] [facility]     Future Orders Please Complete By Expires   Diet Carb Modified      Increase activity slowly      Call MD for:      Comments:   For chest pain, worsening shortness of breath.   Discharge instructions      Comments:   Be sure to follow up with your doctor on 07/11/12 for a blood test for Warfarin (PT/INR). Your PCP will advise you when you can stop the Lovenox. Also talk to your doctor before resuming Orlistat.     Current Discharge Medication List    START taking these medications   Details  enoxaparin (LOVENOX) 100 MG/ML  injection Inject 1.1 mLs (110 mg total) into the skin every 12 (twelve) hours. Qty: 7.3 mL, Refills: 0    warfarin (COUMADIN) 2.5 MG tablet Take 3 tabs on 9/1 and 9/2. Go see your PCP on 9/3 for further instructions Qty: 60 tablet, Refills: 0      CONTINUE these medications which have NOT CHANGED   Details  buPROPion (WELLBUTRIN SR) 150 MG 12 hr tablet Take 150 mg by mouth daily.    Calcium Carbonate-Vit D-Min (CALCIUM 1200 PO) Take by mouth daily.    levETIRAcetam (KEPPRA) 500 MG tablet Take 500 mg by mouth 2 (two) times daily.    loratadine (CLARITIN) 10 MG tablet Take 10 mg by mouth daily.      Multiple Vitamin (MULTIVITAMIN) tablet Take 1 tablet by mouth daily.      fluticasone (FLONASE) 50 MCG/ACT nasal spray Place 1 spray into the nose 2 (two) times daily as needed for rhinitis. Qty: 16 g, Refills: 2   Associated Diagnoses: Chronic sphenoidal sinusitis    Linagliptin-Metformin HCl (JENTADUETO) 2.5-500 MG TABS Take 1 tablet by mouth. TAKE ONLY IF BS ABOVE 150 FASTING      STOP taking these medications  Rivaroxaban (XARELTO) 15 MG TABS tablet      orlistat (ALLI) 60 MG capsule        Follow-up Information    Call Marga Melnick, MD. (See on 9/3 for blood work.)    Contact information:   [address] [place] [address] [phone]          TOTAL DISCHARGE TIME: 35 mins  [facility]  [facility] Pager [phone]  07/09/2012, 8:28 AM

## 2012-07-09 NOTE — Care Management Note (Signed)
    Page 1 of 1   07/09/2012     10:53:15 AM   CARE MANAGEMENT NOTE 07/09/2012  Patient:  Nicole Mccann, Nicole Mccann   Account Number:  [id number]  Date Initiated:  07/09/2012  Documentation initiated by:  [facility]  Subjective/Objective Assessment:   LUE DVT     Action/Plan:   lives at home with husband   Anticipated DC Date:  07/09/2012   Anticipated DC Plan:  HOME/SELF CARE      DC Planning Services  CM consult  Medication Assistance      Choice offered to / List presented to:             Status of service:  Completed, signed off Medicare Important Message given?   (If response is "NO", the following Medicare IM given date fields will be blank) Date Medicare IM given:   Date Additional Medicare IM given:    Discharge Disposition:  HOME/SELF CARE  Per UR Regulation:    If discussed at Long Length of Stay Meetings, dates discussed:    Comments:  07/09/2012 0900 Spoke to husband and states they have drug coverage for medications. States he uses [facility] for his medication or [facility]. Contacted pt's [facility] and they do not have in stock. Contacted [facility] and they have in stock. Made husband aware. Isidoro Donning RN CCM Case Mgmt phone [phone]

## 2012-07-11 ENCOUNTER — Other Ambulatory Visit (INDEPENDENT_AMBULATORY_CARE_PROVIDER_SITE_OTHER): Payer: Medicare Other

## 2012-07-11 DIAGNOSIS — I82409 Acute embolism and thrombosis of unspecified deep veins of unspecified lower extremity: Secondary | ICD-10-CM

## 2012-07-12 ENCOUNTER — Encounter: Payer: Self-pay | Admitting: Internal Medicine

## 2012-07-12 ENCOUNTER — Ambulatory Visit (INDEPENDENT_AMBULATORY_CARE_PROVIDER_SITE_OTHER): Payer: Medicare Other | Admitting: Internal Medicine

## 2012-07-12 VITALS — BP 124/78 | HR 78 | Temp 98.3°F | Wt 251.0 lb

## 2012-07-12 DIAGNOSIS — I809 Phlebitis and thrombophlebitis of unspecified site: Secondary | ICD-10-CM

## 2012-07-12 DIAGNOSIS — I998 Other disorder of circulatory system: Secondary | ICD-10-CM

## 2012-07-12 DIAGNOSIS — I2699 Other pulmonary embolism without acute cor pulmonale: Secondary | ICD-10-CM

## 2012-07-12 DIAGNOSIS — Y842 Radiological procedure and radiotherapy as the cause of abnormal reaction of the patient, or of later complication, without mention of misadventure at the time of the procedure: Secondary | ICD-10-CM

## 2012-07-12 NOTE — Progress Notes (Signed)
  Subjective:    Patient ID: Nicole Mccann, female    DOB: [birth year], 66 y.o.   MRN: [id number]  HPI    Review of Systems     Objective:   Physical Exam        Assessment & Plan:  She is on bupropion 150 mg daily. With the stress of her present illness; she was questioning increasing the dose to 300 mg a day.  Her husband states that Dr Jeral Pinch Neurologist ,had recommended taking 150 mg 2 pills a day because of specific concerns related to brands available on formulary & potential lack of efficacy of certain brands available  by mail-order.  I reviewed the Neurologist's note; there was no mention of increasing the dose from 150 mg level. There was also no discussion of a specificrecommended manufacturer for this agent.  I will send a copy of this office visit to Dr. Marylou Flesher requesting her input as to the specifics of the bupropion prescription .Specific questions ;#1 does she want to increase it to 150 mg 2 pills a day & #2  which manufacturer is  she requesting. We would need her reasons for this or the managed-care will deny the prescription.

## 2012-07-12 NOTE — Progress Notes (Signed)
  Subjective:    Patient ID: Nicole Mccann, female    DOB: 11/03/1946, 66 y.o.   MRN: [id number]  HPI Hospital records were reviewed. It appears that she is experiencing an anticoagulation failure with Xarelto despite compliance. Thrombus is present in the cephalic vein from the upper forearm to the elbow with no internal flow on previous ultrasound 07/07/12. Ventilation perfusion scan was indeterminate; CT angiogram is not possible due to to contrast intolerance/allergy. She is being transitioned to warfarin with Lovenox bridging.  Her PT/INR was 3.0 on 9/3. This was on warfarin 2.5 mg  3 pills daily. Her last Lovenox injection was the evening of 9/3; she complains of significant pain and discomfort with this. Warfarin was decreased to 2.5 mg 2 pills daily.    Review of Systems Her diabetes is well controlled; her highest fasting blood sugars 135. She's on no agents unless the fasting blood sugar i greater than 150. Her A1c was 6% on 8/30.     Objective:   Physical Exam General appearance: in no distress.  Eyes: No conjunctival inflammation or scleral icterus is present.     Heart:  Normal rate and regular rhythm. S1 and S2 normal without gallop, murmur, click, rub S 4     Lungs:Chest clear to auscultation; no wheezes, rhonchi,rales ,or rubs present.No increased work of breathing.    Skin:Warm & dry.  Intact without suspicious lesions or rashes . Large areas of bruising right lower quadrant  Lymphatic: No lymphadenopathy is noted about the head, neck, axilla areas.              Assessment & Plan:  #1 cephalic vein thrombosis only partially responsive to Xarelto; presently therapeutically anticoagulated on warfarin.  #2 presumed thromboemboli; clinically stable without tachycardia or hypoxemia.  Plan: Lovenox will be discontinued. Warfarin will be decreased to 5 mg daily with followup PT/INR in one week. After 3 months of warfarin therapy assessment by hematology will be  pursued.

## 2012-07-12 NOTE — Patient Instructions (Addendum)
PT/INR is therapeutic. DECREASE warfarin dose to 2.5 mg TWO pills daily; repeat PT/INR in  1 week.  If you activate My Chart; the results can be released to you as soon as they populate from the lab. If you choose not to use this program; the labs have to be reviewed, copied & mailed causing a delay in getting the results to you.

## 2012-07-18 ENCOUNTER — Ambulatory Visit (INDEPENDENT_AMBULATORY_CARE_PROVIDER_SITE_OTHER): Payer: Medicare Other | Admitting: *Deleted

## 2012-07-18 VITALS — BP 122/86 | HR 76 | Wt 255.0 lb

## 2012-07-18 DIAGNOSIS — Z7901 Long term (current) use of anticoagulants: Secondary | ICD-10-CM

## 2012-07-18 DIAGNOSIS — I2699 Other pulmonary embolism without acute cor pulmonale: Secondary | ICD-10-CM

## 2012-07-18 LAB — POCT INR: INR: 3.8

## 2012-07-18 NOTE — Patient Instructions (Signed)
Return to office in 2 weeks  New dosing: (2.5mg ) 1 tab today then 1 tab on Tuesday and Saturday and (5mg ) 2 tab daily.

## 2012-07-20 ENCOUNTER — Encounter: Payer: Medicare Other | Admitting: Internal Medicine

## 2012-07-25 ENCOUNTER — Encounter: Payer: Self-pay | Admitting: Internal Medicine

## 2012-07-25 ENCOUNTER — Ambulatory Visit (INDEPENDENT_AMBULATORY_CARE_PROVIDER_SITE_OTHER): Payer: Medicare Other | Admitting: Internal Medicine

## 2012-07-25 VITALS — BP 126/86 | HR 73 | Temp 98.0°F | Resp 12 | Ht 59.5 in | Wt 251.0 lb

## 2012-07-25 DIAGNOSIS — Z23 Encounter for immunization: Secondary | ICD-10-CM

## 2012-07-25 DIAGNOSIS — I82409 Acute embolism and thrombosis of unspecified deep veins of unspecified lower extremity: Secondary | ICD-10-CM

## 2012-07-25 DIAGNOSIS — Z0000 Encounter for general adult medical examination without abnormal findings: Secondary | ICD-10-CM

## 2012-07-25 DIAGNOSIS — E785 Hyperlipidemia, unspecified: Secondary | ICD-10-CM

## 2012-07-25 LAB — LIPID PANEL
HDL: 42.7 mg/dL (ref >39.00)
VLDL: 33 mg/dL (ref 0.0–40.0)

## 2012-07-25 LAB — PROTIME-INR: Prothrombin Time: 32.5 s — ABNORMAL HIGH (ref 10.2–12.4)

## 2012-07-25 MED ORDER — WARFARIN SODIUM 2.5 MG PO TABS: ORAL_TABLET | ORAL | Status: DC

## 2012-07-25 NOTE — Patient Instructions (Addendum)
Preventive Health Care: Exercise  30-45  minutes a day, 3-4 days a week. Water aerobics would be  valuable . Eat a low-fat diet with lots of fruits and non green , leafy vegetables, up to 7-9 servings per day.  Consume less than 30 grams of sugar per day from foods & drinks with High Fructose Corn Syrup as #2,3 or #4 on label. Health Care Power of Attorney & Living Will place you in charge of your health care  decisions. Verify these are  in place.

## 2012-07-25 NOTE — Progress Notes (Signed)
Subjective:    Patient ID: Nicole Mccann, female    DOB: [birth year], 66 y.o.   MRN: [id number]  HPI Medicare Wellness Visit:  The following psychosocial & medical history were reviewed as required by Medicare.   Social history: caffeine: 1 cup expresso , alcohol:  1 glass wine/ day ,  tobacco use : never & exercise :  none.   Home & personal  safety / fall risk: no issues, activities of daily living: no limitations , seatbelt use : yes , and smoke alarm employment : yes .  Power of Attorney/Living Will status : to be completed  Vision ( as recorded per Nurse) & Hearing  evaluation :  Ophth exam 6/13: no retinopathy. Recent hearing evaluation Orientation :oriented X3 , memory & recall : good , math testing: good,and mood & affect : normal . Depression / anxiety: improved. She will start ibuprofen 150 mg twice a day from an  Passenger transport manager  As Rxed by Dr Marylou Flesher. Travel history : [place] 2011 , immunization status :Flu due , transfusion history:  no, and preventive health surveillance ( colonoscopies, BMD , etc as per protocol/ [facility]):  Colonoscopy up   to date, Dental care:  Seen 2011 . Chart reviewed &  Updated. Active issues reviewed & addressed.       Review of Systems She no longer has the pleuritic chest pain. At present she is on warfarin 2.5 mg Tuesdays and Saturdays and 5 mg all other days.  She continues to have some discomfort in the left forearm with repetitive motion. This is localized over the ventral forearm rather than the olecranon area.     Objective:   Physical Exam Gen.: well-nourished in appearance. Alert, appropriate and cooperative throughout exam. Head: Normocephalic without obvious abnormalities Eyes: No corneal or conjunctival inflammation noted. No icterus. Neck: No deformities, masses, or tenderness noted. Range of motion & Thyroid normal. Lungs: Normal respiratory effort; chest expands symmetrically. Lungs are clear to auscultation without rales,  wheezes, or increased work of breathing. Heart: Normal rate and rhythm. Normal S1 and S2. No gallop, click, or rub. Distant heart sounds ;S4 w/o  murmur. Abdomen: Bowel sounds normal; abdomen soft and nontender. No masses, organomegaly or hernias noted.                                                                           Musculoskeletal/extremities: Slight lordosis noted of  the thoracic . No clubbing, cyanosis, edema, or deformity noted. Joints normal. Nail health  good. A level 6 cm above the antecubital crease; left arm circumference is 36 cm and right 36.5. Vascular: Carotid, radial artery, dorsalis pedis and  posterior tibial pulses are full and equal. No bruits present. Neurologic: Alert and oriented x3. Deep tendon reflexes symmetrical and normal.          Skin: Intact without suspicious lesions or rashes. No change in color or temperature of the left upper extremity. No evidence of olecranon tenosynovitis. Lymph: No cervical, axillary lymphadenopathy present. Psych: Mood and affect are normal. Normally interactive  Assessment & Plan:  #1 Medicare Wellness Exam; criteria met ; data entered #2 Problem List reviewed ; Assessment/ Recommendations made Plan: see Orders

## 2012-07-27 ENCOUNTER — Telehealth: Payer: Self-pay | Admitting: *Deleted

## 2012-07-27 NOTE — Telephone Encounter (Signed)
Pt's husband called requesting lab results for pt and f/u on coumadin; LMOM with contact name & number for return call RE: pt's results & further MD instructions; as well informed that letter had been mailed out by MD assistant/SLS

## 2012-07-27 NOTE — Telephone Encounter (Signed)
please call back with pts labs results, his callback# [phone] cell

## 2012-07-27 NOTE — Telephone Encounter (Signed)
Patient is aware of results.

## 2012-08-01 ENCOUNTER — Ambulatory Visit: Payer: Medicare Other

## 2012-08-11 ENCOUNTER — Other Ambulatory Visit: Payer: Self-pay | Admitting: Internal Medicine

## 2012-08-11 DIAGNOSIS — Z1231 Encounter for screening mammogram for malignant neoplasm of breast: Secondary | ICD-10-CM

## 2012-08-16 ENCOUNTER — Ambulatory Visit: Payer: Medicare Other

## 2012-08-16 ENCOUNTER — Encounter: Payer: Self-pay | Admitting: Internal Medicine

## 2012-08-16 ENCOUNTER — Ambulatory Visit (INDEPENDENT_AMBULATORY_CARE_PROVIDER_SITE_OTHER): Payer: Medicare Other | Admitting: Internal Medicine

## 2012-08-16 VITALS — BP 126/82 | HR 76 | Wt 255.6 lb

## 2012-08-16 DIAGNOSIS — Z7901 Long term (current) use of anticoagulants: Secondary | ICD-10-CM

## 2012-08-16 DIAGNOSIS — M79605 Pain in left leg: Secondary | ICD-10-CM

## 2012-08-16 DIAGNOSIS — M79602 Pain in left arm: Secondary | ICD-10-CM

## 2012-08-16 DIAGNOSIS — I82409 Acute embolism and thrombosis of unspecified deep veins of unspecified lower extremity: Secondary | ICD-10-CM

## 2012-08-16 DIAGNOSIS — M79609 Pain in unspecified limb: Secondary | ICD-10-CM

## 2012-08-16 LAB — CBC WITH DIFFERENTIAL/PLATELET
Basophils Absolute: 0 10*3/uL (ref 0.0–0.1)
Eosinophils Absolute: 0.3 10*3/uL (ref 0.0–0.7)
HCT: 44.6 % (ref 36.0–46.0)
Lymphs Abs: 2.5 10*3/uL (ref 0.7–4.0)
MCHC: 32.1 g/dL (ref 30.0–36.0)
MCV: 93.8 fl (ref 78.0–100.0)
Monocytes Absolute: 0.8 10*3/uL (ref 0.1–1.0)
Neutrophils Relative %: 58.3 % (ref 43.0–77.0)
Platelets: 206 10*3/uL (ref 150.0–400.0)
RDW: 12.8 % (ref 11.5–14.6)

## 2012-08-16 MED ORDER — WARFARIN SODIUM 2.5 MG PO TABS: ORAL_TABLET | ORAL | Status: DC

## 2012-08-16 MED ORDER — TRAMADOL HCL 50 MG PO TABS: 50.0000 mg | ORAL_TABLET | Freq: Three times a day (TID) | ORAL | Status: DC | PRN

## 2012-08-16 NOTE — Addendum Note (Signed)
Addended by: Maurice Small on: 08/16/2012 12:23 PM   Modules accepted: Orders

## 2012-08-16 NOTE — Patient Instructions (Addendum)
Use an anti-inflammatory cream such as Aspercreme or Zostrix cream twice a day to the areas of pain as needed. In lieu of this warm moist compresses or  hot water bottle can be used. Do not apply ice.

## 2012-08-16 NOTE — Progress Notes (Signed)
  Subjective:    Patient ID: Nicole Mccann, female    DOB: May 08, 1946, 66 y.o.   MRN: [id number]  HPI She describes constant pain in the left upper extremity manifested as 3-4 separate points of tenderness. It's dull unless she uses her arm when it becomes sharp. This is been present for approximately 5 days. She describes this as red and warm.  She also has constant pain along the medial aspect of the left lower extremity above and below the knee which has been present for over 5 weeks. She also has intermittent joint pain of the dorsum of the foot in 2 areas. The latter improves with elevation of her foot.    Review of Systems  She has been compliant with the warfarin; her PT/INR is therapeutic at 2.2 on the present dose.  She had recurrent clotting on the novel oral anticoagulant Xarelto, requiring change to warfarin    Objective:   Physical Exam   She is well-nourished in no acute distress  There is no lymphadenopathy about the neck, axilla, or epitrochlear areas.  I can appreciate no skin color or temperature changes. Range of motion at the left elbow is good.  Heart rhythm is regular; there is a faint flow murmur.  Chest is clear with no increased work of breathing or abnormal BS  Radial artery and pedal pulses are intact. There is no cyanosis, clubbing, or edema.  She is very tender over the medial aspect of the L knee area. Fusiform changes are noted of the knees. She is using a cane with ambulation        Assessment & Plan:  #1 point pain in the left upper extremity; clinically there is no evidence of thrombotic process. She is adequately anticoagulated this time  #2 pain left medial distal thigh/knee area; musculoskeletal etiology suggested. 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  Plan: Orthopedic referral offered  c

## 2012-08-17 ENCOUNTER — Other Ambulatory Visit: Payer: Self-pay | Admitting: Internal Medicine

## 2012-08-17 ENCOUNTER — Ambulatory Visit (HOSPITAL_BASED_OUTPATIENT_CLINIC_OR_DEPARTMENT_OTHER)
Admission: RE | Admit: 2012-08-17 | Discharge: 2012-08-17 | Disposition: A | Discharge status: Home / Self Care | Payer: Medicare Other | Source: Ambulatory Visit | Attending: Internal Medicine | Admitting: Internal Medicine

## 2012-08-17 ENCOUNTER — Telehealth: Payer: Self-pay

## 2012-08-17 DIAGNOSIS — I82409 Acute embolism and thrombosis of unspecified deep veins of unspecified lower extremity: Secondary | ICD-10-CM

## 2012-08-17 DIAGNOSIS — T801XXA Vascular complications following infusion, transfusion and therapeutic injection, initial encounter: Secondary | ICD-10-CM

## 2012-08-17 DIAGNOSIS — I809 Phlebitis and thrombophlebitis of unspecified site: Secondary | ICD-10-CM

## 2012-08-17 DIAGNOSIS — I82719 Chronic embolism and thrombosis of superficial veins of unspecified upper extremity: Secondary | ICD-10-CM | POA: Insufficient documentation

## 2012-08-17 DIAGNOSIS — Z7901 Long term (current) use of anticoagulants: Secondary | ICD-10-CM | POA: Insufficient documentation

## 2012-08-17 DIAGNOSIS — M7989 Other specified soft tissue disorders: Secondary | ICD-10-CM | POA: Insufficient documentation

## 2012-08-17 NOTE — Telephone Encounter (Signed)
Spoke with patient's husband earlier and he indicated that although Dr.Hopper only ordered U/S for arm he would like for patient to have a U/S of her leg as well.Dr.Hopper was standing near by at the time of call and indicated based on clinical review of patient yesterday he would like U/S of arm only. Patient husband stated he will discuss this over with his wife and requested that I call him back.   I called patient back after 30 min. I spoke directly with patient and she indicated they will go for scheduled U/S at 5:30

## 2012-09-12 ENCOUNTER — Ambulatory Visit (INDEPENDENT_AMBULATORY_CARE_PROVIDER_SITE_OTHER): Payer: Medicare Other | Admitting: Internal Medicine

## 2012-09-12 ENCOUNTER — Encounter: Payer: Self-pay | Admitting: Internal Medicine

## 2012-09-12 ENCOUNTER — Ambulatory Visit: Payer: Medicare Other

## 2012-09-12 VITALS — BP 126/80 | HR 78 | Wt 255.4 lb

## 2012-09-12 DIAGNOSIS — M6580 Other synovitis and tenosynovitis, unspecified site: Secondary | ICD-10-CM

## 2012-09-12 DIAGNOSIS — M659 Synovitis and tenosynovitis, unspecified: Secondary | ICD-10-CM

## 2012-09-12 DIAGNOSIS — Z7901 Long term (current) use of anticoagulants: Secondary | ICD-10-CM

## 2012-09-12 DIAGNOSIS — I82409 Acute embolism and thrombosis of unspecified deep veins of unspecified lower extremity: Secondary | ICD-10-CM

## 2012-09-12 LAB — POCT INR: INR: 1.7

## 2012-09-12 MED ORDER — WARFARIN SODIUM 5 MG PO TABS: ORAL_TABLET | ORAL | Status: DC

## 2012-09-12 NOTE — Patient Instructions (Addendum)
Perform the exercises for elbow pain  twice a day as discussed. Use an anti-inflammatory cream such as Aspercreme or Zostrix cream twice a day to the left elbow after exercises. In lieu of this warm moist compresses or  hot water bottle can be used. Do not apply ice . PT/INR is NOT  therapeutic. Change in warfarin dose: Warfarin 5 mg daily except for 2.5 this Saturday and then 5 mg daily except 2.5 mg every Tuesday and Saturday. Please repeat PT/INR in 7-10 days

## 2012-09-12 NOTE — Progress Notes (Signed)
  Subjective:    Patient ID: Nicole Mccann, female    DOB: [birth year], 66 y.o.   MRN: [id number]  HPI PT/INR is 1.7 today on 5 mg every day except 2.5 mg Tuesday, Thursday, and Saturday. She did miss one Saturday dose. She states that the Neurologist @ [facility]  told her she is taking too much Coumadin based reaction around the Lovenox.   Copies of history and physical and discharge summary 8/30-07/09/12 were provided    Review of Systems  The pain she was having at the left medial knee area resolved with elevation. The 3 punctate areas of pain in the left forearm and 3 in the dorsum of the left foot have essentially resolved; but she has some residual linear pain at the left lateral ulnar area.     Objective:   Physical Exam  She is well-nourished in no distress  Chest is clear with no increased work of breathing.  She has a regular rhythm with no significant murmurs  She has no lymphadenopathy about the head, neck, axilla, or epitrochlear area.  She has classic tenosynovitis of the left elbow manifested by increased pain with making a fist and pronating the forearm  Skin:  Intact without suspicious lesions or rashes        Assessment & Plan:  #1 suboptimal anticoagulation; she should increase the warfarin to 5 mg today and every day except 2.5 on Tuesday and Saturday. Recheck PT/INR in 7-10 days.  #2 tenosynovitis left elbow  Plan: See orders and recommendations

## 2012-09-20 ENCOUNTER — Ambulatory Visit (INDEPENDENT_AMBULATORY_CARE_PROVIDER_SITE_OTHER): Payer: Medicare Other

## 2012-09-20 VITALS — BP 126/82 | HR 78 | Wt 253.0 lb

## 2012-09-20 DIAGNOSIS — Z7901 Long term (current) use of anticoagulants: Secondary | ICD-10-CM

## 2012-09-20 DIAGNOSIS — I82409 Acute embolism and thrombosis of unspecified deep veins of unspecified lower extremity: Secondary | ICD-10-CM

## 2012-09-20 LAB — POCT INR: INR: 1.7

## 2012-09-20 NOTE — Patient Instructions (Addendum)
Current instructions: 5 mg daily EXCEPT 2.5 mg on T/Sat New Instructions: 7.5 mg today, then 5 mg daily  RECHECK in 10 days

## 2012-09-21 ENCOUNTER — Ambulatory Visit
Admission: RE | Admit: 2012-09-21 | Discharge: 2012-09-21 | Disposition: A | Discharge status: Home / Self Care | Payer: Medicare Other | Source: Ambulatory Visit | Attending: Internal Medicine | Admitting: Internal Medicine

## 2012-09-21 ENCOUNTER — Ambulatory Visit: Payer: Medicare Other

## 2012-09-21 DIAGNOSIS — Z1231 Encounter for screening mammogram for malignant neoplasm of breast: Secondary | ICD-10-CM

## 2012-09-29 ENCOUNTER — Ambulatory Visit (INDEPENDENT_AMBULATORY_CARE_PROVIDER_SITE_OTHER): Payer: Medicare Other

## 2012-09-29 VITALS — BP 110/70 | HR 72 | Temp 98.1°F | Wt 251.0 lb

## 2012-09-29 DIAGNOSIS — I82409 Acute embolism and thrombosis of unspecified deep veins of unspecified lower extremity: Secondary | ICD-10-CM

## 2012-09-29 DIAGNOSIS — Z7901 Long term (current) use of anticoagulants: Secondary | ICD-10-CM

## 2012-09-29 NOTE — Patient Instructions (Signed)
Per Dr.Hopper no change and recheck in 1 month (4 weeks)

## 2012-10-24 ENCOUNTER — Ambulatory Visit (INDEPENDENT_AMBULATORY_CARE_PROVIDER_SITE_OTHER): Payer: Medicare Other

## 2012-10-24 VITALS — BP 124/80 | HR 73 | Wt 256.8 lb

## 2012-10-24 DIAGNOSIS — Z7901 Long term (current) use of anticoagulants: Secondary | ICD-10-CM

## 2012-10-24 DIAGNOSIS — I82409 Acute embolism and thrombosis of unspecified deep veins of unspecified lower extremity: Secondary | ICD-10-CM

## 2012-10-24 MED ORDER — WARFARIN SODIUM 5 MG PO TABS: ORAL_TABLET | ORAL | Status: DC

## 2012-10-24 NOTE — Patient Instructions (Addendum)
2.5 mg Today, then 5 mg daily and recheck in 4 weeks

## 2012-11-19 ENCOUNTER — Encounter: Payer: Self-pay | Admitting: Internal Medicine

## 2012-11-21 ENCOUNTER — Ambulatory Visit: Payer: Medicare Other

## 2012-12-15 ENCOUNTER — Telehealth: Payer: Self-pay

## 2012-12-15 ENCOUNTER — Ambulatory Visit: Payer: Medicare Other | Admitting: Internal Medicine

## 2012-12-15 ENCOUNTER — Other Ambulatory Visit (HOSPITAL_COMMUNITY): Payer: Self-pay | Admitting: Pain Medicine

## 2012-12-15 ENCOUNTER — Encounter (HOSPITAL_COMMUNITY): Payer: Self-pay

## 2012-12-15 ENCOUNTER — Ambulatory Visit (HOSPITAL_COMMUNITY)
Admission: RE | Admit: 2012-12-15 | Discharge: 2012-12-15 | Disposition: A | Discharge status: Home / Self Care | Payer: Medicare Other | Source: Ambulatory Visit | Attending: Pain Medicine | Admitting: Pain Medicine

## 2012-12-15 DIAGNOSIS — R52 Pain, unspecified: Secondary | ICD-10-CM

## 2012-12-15 DIAGNOSIS — M5126 Other intervertebral disc displacement, lumbar region: Secondary | ICD-10-CM

## 2012-12-15 DIAGNOSIS — M79609 Pain in unspecified limb: Secondary | ICD-10-CM | POA: Insufficient documentation

## 2012-12-15 DIAGNOSIS — M47817 Spondylosis without myelopathy or radiculopathy, lumbosacral region: Secondary | ICD-10-CM | POA: Insufficient documentation

## 2012-12-15 DIAGNOSIS — M51379 Other intervertebral disc degeneration, lumbosacral region without mention of lumbar back pain or lower extremity pain: Secondary | ICD-10-CM | POA: Insufficient documentation

## 2012-12-15 DIAGNOSIS — M5137 Other intervertebral disc degeneration, lumbosacral region: Secondary | ICD-10-CM | POA: Insufficient documentation

## 2012-12-15 DIAGNOSIS — M545 Low back pain, unspecified: Secondary | ICD-10-CM | POA: Insufficient documentation

## 2012-12-15 DIAGNOSIS — Q762 Congenital spondylolisthesis: Secondary | ICD-10-CM | POA: Insufficient documentation

## 2012-12-15 NOTE — Telephone Encounter (Signed)
Patient walked in with her husband wanting to be seen. Patient had a letter on a rx that was from [facility] asking if patient can be cleared to have a steroid injection. Per Dr.Hopper patient to be seen, patient's husband would not schedule because he did not like the idea that the patient would have to wait (Dr.Hopper's schedule was completely full) If patient could not be seen right away he preferred that she not be seen @ all and suggested that Dr.Hopper call Dr.Crisp to further follow-up  Dr.Crisp's phone number [phone], fax-[phone]

## 2012-12-15 NOTE — Telephone Encounter (Signed)
A message was left with the [facility] requesting  call back but this has not been received as of 5:50 PM. Because of her complications with almost every prior interventional procedure; I have great misgivings about this intervention.

## 2012-12-17 ENCOUNTER — Encounter: Payer: Self-pay | Admitting: Internal Medicine

## 2012-12-17 DIAGNOSIS — M48061 Spinal stenosis, lumbar region without neurogenic claudication: Secondary | ICD-10-CM | POA: Insufficient documentation

## 2012-12-17 HISTORY — DX: Spinal stenosis, lumbar region without neurogenic claudication: M48.061

## 2012-12-18 ENCOUNTER — Ambulatory Visit (INDEPENDENT_AMBULATORY_CARE_PROVIDER_SITE_OTHER): Payer: Medicare Other | Admitting: Internal Medicine

## 2012-12-18 ENCOUNTER — Encounter: Payer: Self-pay | Admitting: Internal Medicine

## 2012-12-18 VITALS — BP 122/76 | HR 82 | Temp 98.0°F | Wt 255.0 lb

## 2012-12-18 DIAGNOSIS — M48061 Spinal stenosis, lumbar region without neurogenic claudication: Secondary | ICD-10-CM

## 2012-12-18 DIAGNOSIS — E119 Type 2 diabetes mellitus without complications: Secondary | ICD-10-CM

## 2012-12-18 NOTE — Progress Notes (Signed)
  Subjective:    Patient ID: Nicole Mccann, female    DOB: Aug 12, 1946, 67 y.o.   MRN: [id number]  HPI BACK PAIN: Back pain began 5 weeks ago in the mid lumbosacral spine area w/o associated  repetitive motion, injury, lifting, overuse or hyperextension. The pain is described as  sharp & electric and radiating to left LS area & to L ankle. Pain level up to 10. The pain is worse supine & better sitting . Pain had improved temporarily with ice ,NSAIDS, &Tylenol. Associated signs and symptoms include numbness and tingling in the left  leg to the level of the ankle. Limb weakness has been present.                                                                 Review of Systems Negative or absent signs and symptoms include: Constitutional: No fever, chills, sweats, unexplained weight loss HEENT: No diplopia, blurred vision, or loss of vision.  Cardiopulmonary: No chest pain; palpitations; dyspnea; edema;cough ; sputum production ;hemoptysis GI: No abdominal pain;  melena; rectal bleeding GU: No hematuria, pyuria, or dysuria MS: No joint stiffness;redness; swelling Heme/Lymph:No abnormal bruising or bleeding     Objective:   Physical Exam  Gen.:  well-nourished in appearance. Alert, appropriate and cooperative throughout exam. Appears younger than stated age  Head: Normocephalic without obvious abnormalities  Eyes: No corneal or conjunctival inflammation noted.  Neck: No deformities, masses, or tenderness noted. Range of motion good. Lungs: Normal respiratory effort; chest expands symmetrically. Lungs are clear to auscultation without rales, wheezes, or increased work of breathing. Heart: Normal rate and rhythm. Normal S1 and S2. No gallop, click, or rub. S4                                 Musculoskeletal/extremities: Minimally accentuated curvature of upper thoracic  spine. No clubbing, cyanosis, edema, or significant extremity  deformity noted. Tone & strength  normal.Joints normal  . Nail health good. Using cane for support Neurologic: Alert and oriented x3. Deep tendon reflexes symmetrical and normal. Skin: Intact without suspicious lesions or rashes. Lymph: No cervical, axillary lymphadenopathy present. Psych: Mood and affect are normal. Normally interactive                               Assessment & Plan:

## 2012-12-18 NOTE — Assessment & Plan Note (Signed)
There is no contraindication to discontinuing the aspirin for the epidural steroid injections by Dr. Arta Bruce. I recommend involving  [name],Neurologist; & Dr Margaretmary Bayley, Diabetologist for continuity of care.

## 2012-12-18 NOTE — Patient Instructions (Signed)
Review and correct the record as indicated. Please share record with all medical staff seen.  There is no contraindication to stopping aspirin if epidural steroid injections are pursued

## 2012-12-18 NOTE — Telephone Encounter (Signed)
Patient with pending appointment today  

## 2012-12-18 NOTE — Assessment & Plan Note (Signed)
If epidural steroid injections are pursued; close diabetic monitor and followup with Dr. Chestine Spore indicated

## 2012-12-22 ENCOUNTER — Telehealth: Payer: Self-pay | Admitting: Internal Medicine

## 2012-12-22 NOTE — Telephone Encounter (Signed)
Caller: Juan/Spouse; PCP: Marga Melnick; CB#: [phone]; Call regarding the need to relay a message to Dr. Alwyn Ren; Pt was to have a cortisone injection in her lumbar region on  12/22/12 but Dr. Metta Clines postponed until 12/29/12 due to her being on an ASA. Please contact Dr. Metta Clines at [facility] about whether pt can be on or off of daily ASA and then contact pt to inform of final decision. Message tasked in [facility] EMR.

## 2013-02-20 ENCOUNTER — Encounter: Payer: Medicare Other | Attending: Internal Medicine | Admitting: Dietician

## 2013-02-20 ENCOUNTER — Encounter: Payer: Self-pay | Admitting: Dietician

## 2013-02-20 VITALS — Ht 61.0 in | Wt 252.5 lb

## 2013-02-20 DIAGNOSIS — Z713 Dietary counseling and surveillance: Secondary | ICD-10-CM | POA: Insufficient documentation

## 2013-02-20 DIAGNOSIS — E119 Type 2 diabetes mellitus without complications: Secondary | ICD-10-CM | POA: Insufficient documentation

## 2013-02-20 DIAGNOSIS — E669 Obesity, unspecified: Secondary | ICD-10-CM | POA: Insufficient documentation

## 2013-02-20 NOTE — Progress Notes (Signed)
Medical Nutrition Therapy:  Appt start time: 1615 end time:  1745.   Assessment:  Primary concerns today: Comes today for information regarding weight loss  Notes the diabetes is under control, but the weight is causing back pain and knee pain. She has a history of type 2 diabetes for about 10-13 years.  Most recent A1C was 6.2%.  She has had 2 major health issues over the last 12 months and they have produced physical stress and have limited her physical activity.  Today she comes using a cane which she notes helps her feel more safe and stable when she has to walk any distances.  She uses a cane when away from home.   Her husband accompanied her and is very supportive.  While Spanish is her native language, she is fluent in [place] and ask for [place] handouts.  She has been trying a number of nutritional approaches.  She relates that everyone she knows has advice about weight loss and she wants definitive information.  She has tried for the last 3 weeks to eat no bread and limit starch intake and she has lost 2.5 lb since her appointment with Dr. Alwyn Ren on 12/18/2012.  BLOOD GLUCOSE MONITORING:  Monitors daily fasting glucose.  Fasting:  Since her last Steroid injection, has been less than 130 mg/dl.  Generally runs 107-120 or less.  She is not to take the [facility] only if her fasting glucose is greater than 150 mg/dl.   HYPOGLYCEMIA:  Denies any S/S of low blood glucose.  HYPERGLYCEMIA: Notes she has regular symptoms of increased thirst, urination.  Relates that skin is less dry than it was previously.  MEDICATIONS: Med review completed, Taking JentaDueto for glucose control as needed.  DAILY FOOT SELF-EXAM:  Completes on a daily basis.  DILATED EYE EXAM:  Had yearly exam last week.  Meal Plan Use: No    DIETARY INTAKE:  Usual eating pattern includes 2-3 meals and 1-2 snacks per day.  Avoided foods include Mushrooms and shellfish.    24-hr recall:  B ( AM): 9:30-10:00 Smoothie with  blue berries, pyparya, honey, cinnamon, apple, banana, agava nector, milk ( Lactaid fat free with Calcium) and a touch of juice 12-14 oz.  Green tea.(tea. Water and sugar (turbinado sugar) Snk ( AM): none  L ( PM): 12:30-1:00 egg scrambled and tortilla, corn with 2 tomato slices and ham and glass of juice (mango) 6 oz Snk ( PM): nuts, walnuts, almonds, handful D ( PM): 7-8:00 PM chicken, with onion, peppers, lemons, salt pepper, paprika, mixed vegetables (broccolli, corn, calufower aieh olive oil and garlic)  Glass of wine.  Snk ( PM): coffee with cream and sugar.and small pastry.  Beverages: coffee, green tea, mango or fruit juice, milk in the smoothie, wine.  Usual physical activity: Currently using a cane and has recently received a Steroid injection to the back for pain relief.  C/O knee pain and notes that she is refusing surgery.  Not currently exercising.   Estimated energy needs:  HT: 61 in   WT: 252.5 lb  BMI: 47.8 kg/m2   Adj WT:152 lb  (69 kg) 1300-1400 calories 150-155 g carbohydrates 100-105 g protein 36-38 g fat  Progress Towards Goal(s):  In progress.   Nutritional Diagnosis:  [place]-2.1 Inpaired nutrition utilization As related to glucose.  As evidenced by diagnosis of type 2 diabetes, A1C at 6.2%, fasting blood glucose 100-130 mg at times,  increased abdominal girth..    Intervention:  Nutrition/diabetes  Water exercise.  Start low and Slow but Start.  3 days per week.  By the end of the summer aim for 150 minutes per week.   With the smoothie, have 1/2 of your normal serving for the OR have half of the fruit used as fresh fruit not blended up.  Cereal: needs to have fiber at 3+ grm per serving.    Use your food label and always when using a bread or cereal, use the whole grains.  For a bread, look for 2 gm fiber per slice.  On the Food Label the sugar for each product should for the majority of the day read Sugar: (0-9 gm).  Have a protein at every meal and snack.   Bake, Broil Grill, Roast.  If sauteing, use a limited (1 Tbsp ) of olive oil.  Trim the fat from meats and remove the skin from poultry.  Try to use the poultry and fish more often than the red meats.  If using nuts as protein, measure 1/4 cup as your serving.  Consider using a scale for weighing.    Protein serving at meal time is size of the palm of your hand.  At snack time it will be about 1-2 oz.  Eat all the non-starchy vegetables you want.  Limit the added fat serving to 1-2 servings per meal.  Use a food label as resource for servings of the oil, salad dressing, mayo, etc.  Keep the serving of carbohydrate (starches, grains, beans, fruits, milk, yogurt) to 30-45 gm per meal.  Thing of your carb grams at meal times as "carb bucks or carb money to spend for the meal or snack."  Count these carb bucks as you would count your dollars.  Take each day one at a time.  Plan to follow-up with me in 4 weeks.  Call the [phone] for an appointment.  I enjoyed meeting and working with you yesterday.  I have confidence that you can do this.  Take care, call if you have questions or need a pep talk.  Handouts given during visit include:  Living Well with Diabetes  Novo Nordisk Carb Counting Guide  Yellow Card with Diet Prescription   Monitoring/Evaluation:  Dietary intake, exercise, blood glucose levels, and body weight In 4 weeks.Marland Kitchen

## 2013-02-20 NOTE — Patient Instructions (Addendum)
   Water exercise.  Start low and Slow but Start.  3 days per week.  By the end of the summer aim for 150 minutes per week.   With the smoothie, have 1/2 of your normal serving for the OR have half of the fruit used as fresh fruit not blended up.  Cereal: needs to have fiber at 3+ grm per serving.    Use your food label and always when using a bread or cereal, use the whole grains.  For a bread, look for 2 gm fiber per slice.  On the Food Label the sugar for each product should for the majority of the day read Sugar: (0-9 gm).  Have a protein at every meal and snack.  Bake, Broil Grill, Roast.  If sauteing, use a limited (1 Tbsp ) of olive oil.  Trim the fat from meats and remove the skin from poultry.  Try to use the poultry and fish more often than the red meats.  If using nuts as protein, measure 1/4 cup as your serving.  Consider using a scale for weighing.    Protein serving at meal time is size of the palm of your hand.  At snack time it will be about 1-2 oz.  Eat all the non-starchy vegetables you want.  Limit the added fat serving to 1-2 servings per meal.  Use a food label as resource for servings of the oil, salad dressing, mayo, etc.  Keep the serving of carbohydrate (starches, grains, beans, fruits, milk, yogurt) to 30-45 gm per meal.  Thing of your carb grams at meal times as "carb bucks or carb money to spend for the meal or snack."  Count these carb bucks as you would count your dollars.  Take each day one at a time.  Plan to follow-up with me in 4 weeks.  Call the [phone] for an appointment.  I enjoyed meeting and working with you yesterday.  I have confidence that you can do this.  Take care, call if you have questions or need a pep talk.

## 2013-03-29 ENCOUNTER — Other Ambulatory Visit: Payer: Self-pay | Admitting: Internal Medicine

## 2013-03-29 DIAGNOSIS — E069 Thyroiditis, unspecified: Secondary | ICD-10-CM

## 2013-04-12 ENCOUNTER — Encounter (HOSPITAL_COMMUNITY)
Admission: RE | Admit: 2013-04-12 | Discharge: 2013-04-12 | Disposition: A | Discharge status: Home / Self Care | Payer: Medicare Other | Source: Ambulatory Visit | Attending: Internal Medicine | Admitting: Internal Medicine

## 2013-04-12 DIAGNOSIS — E069 Thyroiditis, unspecified: Secondary | ICD-10-CM

## 2013-04-13 ENCOUNTER — Other Ambulatory Visit: Payer: Self-pay | Admitting: Gastroenterology

## 2013-04-13 ENCOUNTER — Encounter (HOSPITAL_COMMUNITY)
Admission: RE | Admit: 2013-04-13 | Discharge: 2013-04-13 | Disposition: A | Discharge status: Home / Self Care | Payer: Medicare Other | Source: Ambulatory Visit | Attending: Internal Medicine | Admitting: Internal Medicine

## 2013-04-13 MED ORDER — SODIUM PERTECHNETATE TC 99M INJECTION
10.4000 | Freq: Once | INTRAVENOUS | Status: AC | PRN
  Administered 2013-04-13: 10 via INTRAVENOUS

## 2013-04-13 MED ORDER — SODIUM IODIDE I 131 CAPSULE
13.5000 | Freq: Once | INTRAVENOUS | Status: AC | PRN
  Administered 2013-04-13: 13.5 via ORAL

## 2013-05-22 ENCOUNTER — Encounter: Payer: Self-pay | Admitting: Nurse Practitioner

## 2013-05-22 ENCOUNTER — Ambulatory Visit (INDEPENDENT_AMBULATORY_CARE_PROVIDER_SITE_OTHER): Payer: Medicare Other | Admitting: Nurse Practitioner

## 2013-05-22 VITALS — BP 132/63 | HR 84 | Ht 60.0 in | Wt 258.0 lb

## 2013-05-22 DIAGNOSIS — G43809 Other migraine, not intractable, without status migrainosus: Secondary | ICD-10-CM

## 2013-05-22 DIAGNOSIS — R439 Unspecified disturbances of smell and taste: Secondary | ICD-10-CM | POA: Insufficient documentation

## 2013-05-22 HISTORY — DX: Other migraine, not intractable, without status migrainosus: G43.809

## 2013-05-22 HISTORY — DX: Unspecified disturbances of smell and taste: R43.9

## 2013-05-22 NOTE — Patient Instructions (Addendum)
Increase Lamictal to 100 mg XR  2 tabs daily Will refill Followup yearly and when necessary

## 2013-05-22 NOTE — Progress Notes (Signed)
HPI: Nicole Mccann, 67 -year-old pleasant [place] lady returns for followup. She was last seen by Dr. Vickey Huger 11/21/2012. She initially presented for evaluation of dizziness, vertigo with a spinning sensation, feeling cold,  all this beginning with an olfactorial sensation, and ending with a head pain behind the ears, then feeling hot.  These spells last less than ten minutes and come on up to five a day, starting with one or two times daily.  She was so dizzy she felt she would pass out and vomited.  Her glucose was low at 84  mg/dl and BP was  086/57 mmHg,  pulse was 80.   The vertigo is increasing and she feels more HA and nausea. She stated that some bright colors will hurt her eyes, orange and green. She tried to overpower the olfactory sensation by using perfume.  06-28-12  Patient was seen in the EMU at [facility] and diagnosed with an olfactory aura, no EEG abnormalities. 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  The Keppra was well tolerated. She has only once a week any aura, not daily.  November 21, 2012.  Patient developed depression, possibly worseing on Keppra which we weaned off. She is no longer taking Coumadin for the presumed upper extremity DVT (Dr. Azucena Kuba, [facility])  perhaps rather a thrombophlebitis. She was changed to Lamictal and was doing well on 100 mg XR daily in the AM  after a slow titration.  When she  was taking  her dose, the olfactory aura came back over the last three days.  She will progress to 200 mg bid  now. Dr Alwyn Ren will follow.  05/21/13- followup visit today and patient has had 2 auras  since last seen . Her Lamictal dose is currently at 150 daily instead of 200 as Dr. Vickey Huger had ordered. Apparently she did not understand that she was supposed to continue to titrate to 200. She denies any side effects to the medication. He has no new neurologic complaints.  She is currently receiving lumbar epidural steroids by Dr. Arta Bruce for her low back pain   ROS:  Weight gain, easy bruising, allergies, snoring  Physical Exam General: well developed, well nourished, seated, in no evident distress Head: head normocephalic and atraumatic. Oropharynx benign Neck: supple with no carotid  bruits Cardiovascular: regular rate and rhythm, no murmurs  Neurologic Exam Mental Status: Awake and fully alert. Oriented to place and time. Follows all commands. Speech and language normal.   Cranial Nerves: Fundoscopic exam without evidence of papilledema . Pupils equal, briskly reactive to light. Extraocular movements full without nystagmus. Visual fields full to confrontation. Hearing intact and symmetric to finger snap. Facial sensation intact. Face, tongue, palate move normally and symmetrically. Neck flexion and extension normal.  Motor: Normal bulk and tone. Normal strength in all tested extremity muscles.No focal weakness Coordination: Rapid alternating movements normal in all extremities. Finger-to-nose  performed accurately bilaterally. Gait and Station: Arises from chair without difficulty. Stance is wide-based, ambulates with a single-point cane   Reflexes: 2+ and symmetric. Toes downgoing.     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

## 2013-06-13 ENCOUNTER — Other Ambulatory Visit: Payer: Self-pay

## 2013-06-29 ENCOUNTER — Ambulatory Visit (INDEPENDENT_AMBULATORY_CARE_PROVIDER_SITE_OTHER): Payer: Medicare Other | Admitting: Family Medicine

## 2013-06-29 ENCOUNTER — Encounter: Payer: Self-pay | Admitting: Family Medicine

## 2013-06-29 VITALS — BP 130/78 | HR 82 | Temp 98.5°F | Ht 61.0 in | Wt 261.4 lb

## 2013-06-29 DIAGNOSIS — IMO0001 Reserved for inherently not codable concepts without codable children: Secondary | ICD-10-CM

## 2013-06-29 DIAGNOSIS — W57XXXA Bitten or stung by nonvenomous insect and other nonvenomous arthropods, initial encounter: Secondary | ICD-10-CM | POA: Insufficient documentation

## 2013-06-29 MED ORDER — TRIAMCINOLONE ACETONIDE 0.1 % EX OINT: TOPICAL_OINTMENT | Freq: Two times a day (BID) | CUTANEOUS | Status: DC

## 2013-06-29 NOTE — Patient Instructions (Addendum)
Clean w/ peroxide daily Apply triple antibiotic ointment twice daily Use the Triamcinolone ointment twice daily if needed for itching Call if symptoms change or worsen Call with any questions or concerns Hang in there!

## 2013-06-29 NOTE — Assessment & Plan Note (Signed)
New.  Infection seems limited to actual bite and not surrounding skin/tissue.  Due to pt's multiple abx allergies will use topical abx ointment and avoid orals if possible.  Reviewed supportive care and red flags that should prompt return.  Pt expressed understanding and is in agreement w/ plan.

## 2013-06-29 NOTE — Progress Notes (Signed)
  Subjective:    Patient ID: Nicole Mccann, female    DOB: 05/23/1946, 67 y.o.   MRN: [id number]  HPI Red spot on back- first appeared 2-3 weeks ago.  Has never had a white head on it.  Painful to touch.  Irritated by clothing, itchy.  No drainage.     Review of Systems For ROS see HPI     Objective:   Physical Exam  Vitals reviewed. Constitutional: She appears well-developed and well-nourished. No distress.  Skin: Skin is warm and dry. There is erythema (erythematous, scaling, insect bite w/out pus or visible drainage and no surrounding induration or erythema).          Assessment & Plan:

## 2013-07-09 DIAGNOSIS — B029 Zoster without complications: Secondary | ICD-10-CM

## 2013-07-09 HISTORY — DX: Zoster without complications: B02.9

## 2013-07-10 ENCOUNTER — Telehealth: Payer: Self-pay | Admitting: Internal Medicine

## 2013-07-10 NOTE — Telephone Encounter (Signed)
Patient came in on 06/29/13 to see Dr. Beverely Low for a bug bite which her husband states is not improving. Husband states that it looks as though she has another one and wants to know if they need to come back in. Please advise.

## 2013-07-10 NOTE — Telephone Encounter (Signed)
Spoke with pt husband. Called back and scheduled pt for 07-11-2013 with Tabori to reevaluate.

## 2013-07-11 ENCOUNTER — Ambulatory Visit (INDEPENDENT_AMBULATORY_CARE_PROVIDER_SITE_OTHER): Payer: Medicare Other | Admitting: Family Medicine

## 2013-07-11 ENCOUNTER — Encounter: Payer: Self-pay | Admitting: Family Medicine

## 2013-07-11 VITALS — BP 130/82 | HR 84 | Temp 98.3°F | Ht 61.0 in | Wt 257.8 lb

## 2013-07-11 DIAGNOSIS — B029 Zoster without complications: Secondary | ICD-10-CM

## 2013-07-11 MED ORDER — VALACYCLOVIR HCL 1 G PO TABS: 1000.0000 mg | ORAL_TABLET | Freq: Three times a day (TID) | ORAL | Status: DC

## 2013-07-11 NOTE — Patient Instructions (Addendum)
This is shingles Start the Valtrex 3x/day As long as the area is covered you are not contagious Hang in there!

## 2013-07-11 NOTE — Progress Notes (Signed)
  Subjective:    Patient ID: [name], female    DOB: [birth year], 67 y.o.   MRN: [id number]  HPI Bug bite- pt reports that surrounding redness has improved but central site is unchanged and now more painful than itchy.  No other similar areas.  Pain radiates laterally away from red spot.  No drainage.  No fevers.   Review of Systems For ROS see HPI     Objective:   Physical Exam  Vitals reviewed. Constitutional: She appears well-developed and well-nourished. No distress.  Skin: Skin is warm and dry. Rash (1.5 cm diameter area of erythema w/ tiny vesicles, + TTP) noted.          Assessment & Plan:

## 2013-07-11 NOTE — Assessment & Plan Note (Signed)
New.  This appears to be a very small distribution of shingles.  Suspect mild case due to pt's hx of vaccination.  Start valtrex.  Reviewed supportive care and red flags that should prompt return.  Pt expressed understanding and is in agreement w/ plan.

## 2013-07-12 ENCOUNTER — Telehealth: Payer: Self-pay | Admitting: *Deleted

## 2013-07-12 MED ORDER — BUPROPION HCL ER (SR) 150 MG PO TB12: 150.0000 mg | ORAL_TABLET | Freq: Two times a day (BID) | ORAL | Status: DC

## 2013-07-12 NOTE — Telephone Encounter (Signed)
Nicole Mccann the patients husband is requesting her Wellbutrin RX to be refilled and sent to [facility]. Please call him when it has been done at [phone]

## 2013-07-12 NOTE — Telephone Encounter (Signed)
Rx has been sent.   E-Prescribing Status: Receipt confirmed by pharmacy (07/12/2013 11:16 AM EDT)       I called the patient back.  They are aware.

## 2013-08-16 IMAGING — CR DG CHEST 2V
2 series · 2 of 2 positions shown · non-contrast
Comparison: 10/27/2011

CLINICAL DATA: Shortness of breath.

CHEST - 2 VIEW

[w chest pa]
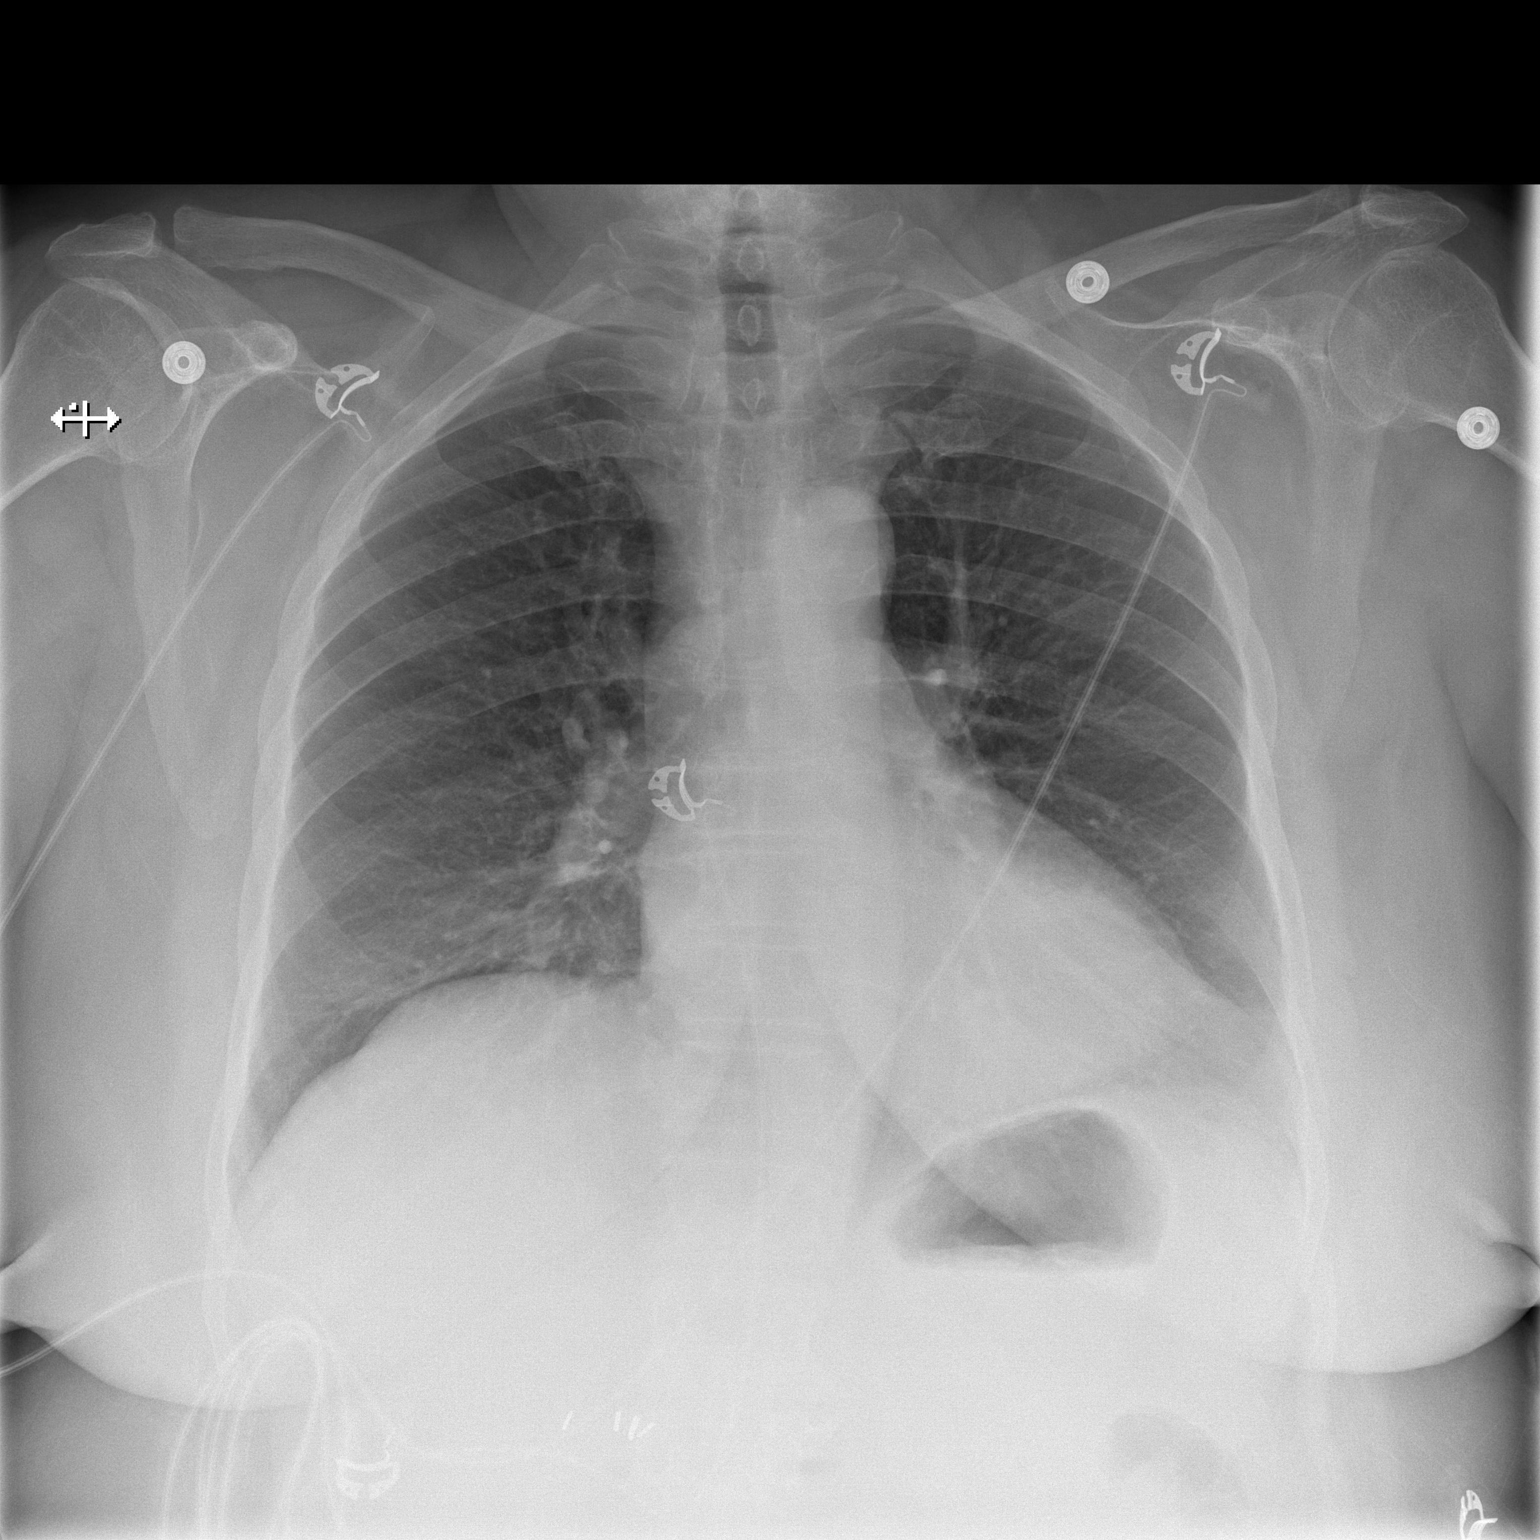

[w chest lat]
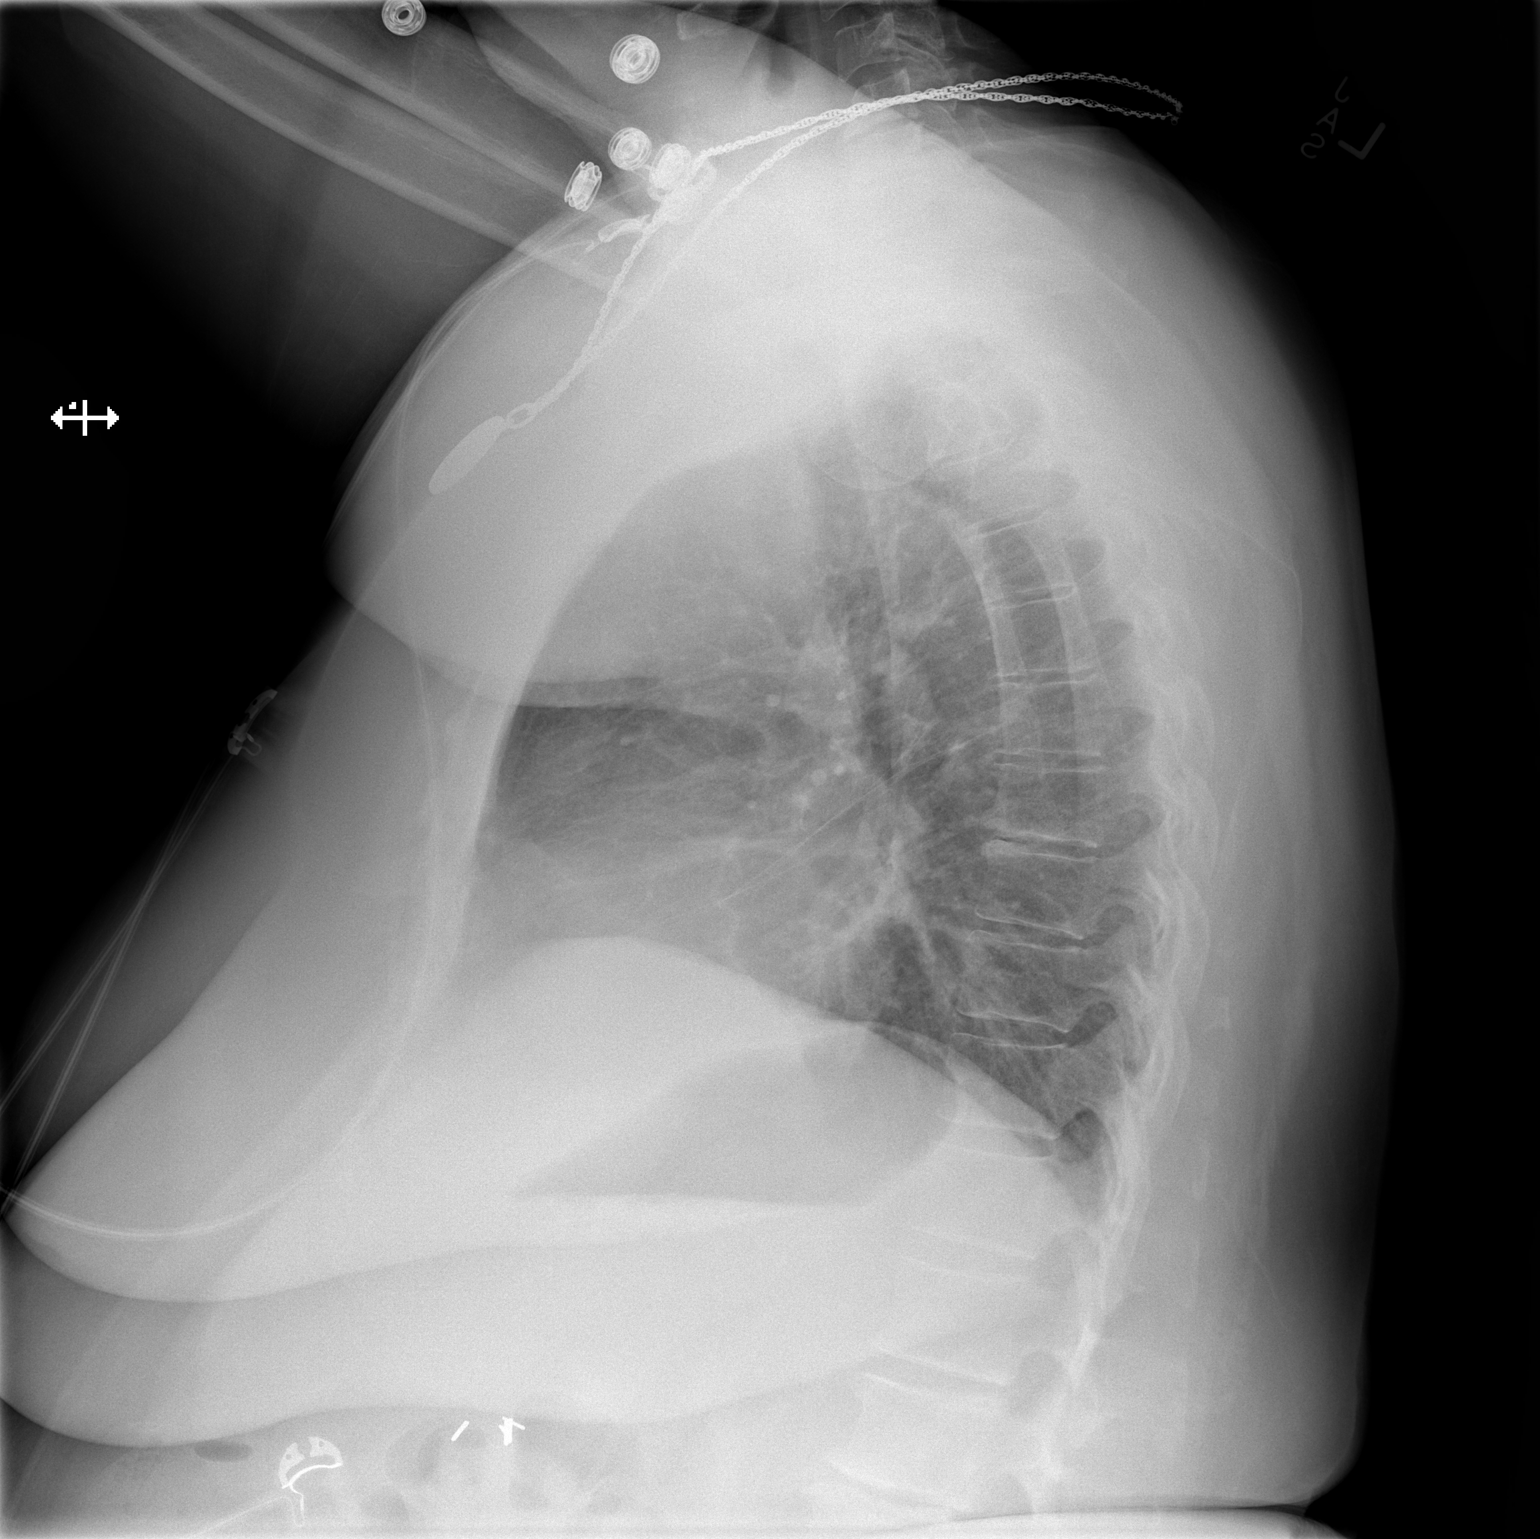

[2 of 2 positions shown; findings below may reference images not displayed]

FINDINGS: The heart size and mediastinal contours are within normal
limits.  Both lungs are clear.  The visualized skeletal structures
are unremarkable.
IMPRESSION: No active disease.

## 2013-08-16 IMAGING — US US EXTREM  UP VENOUS*L*
1 series · 14 of 24 positions shown · non-contrast
Comparison: None.

CLINICAL DATA: Left lateral upper arm pain.  Prior left cephalic
vein thrombosis, currently on blood thinners.

UPPER LEFT VENOUS EXTREMITY ULTRASOUND
TECHNIQUE: Gray-scale sonography with compression, as well as
color and duplex Doppler ultrasound, were performed to evaluate the
deep venous system from the level of the common femoral vein
through the popliteal and proximal calf veins.

[Series 1: us extrem up venous*left* · 14 of 33 slices shown]
[im 1/33]
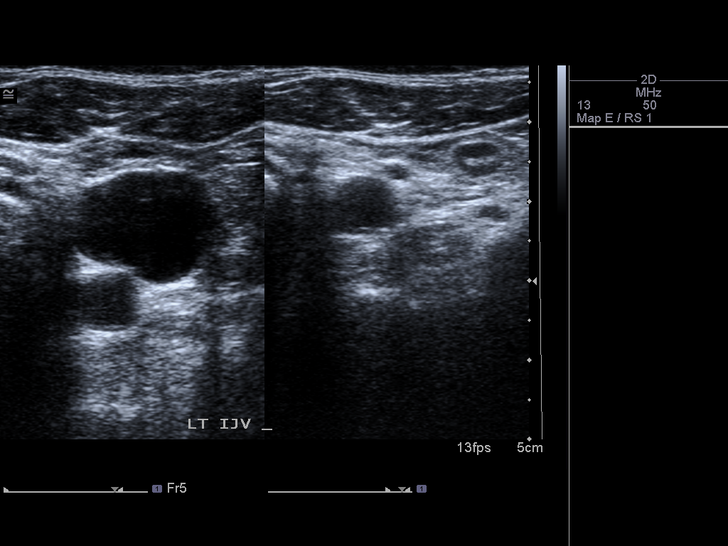
[im 3/33]
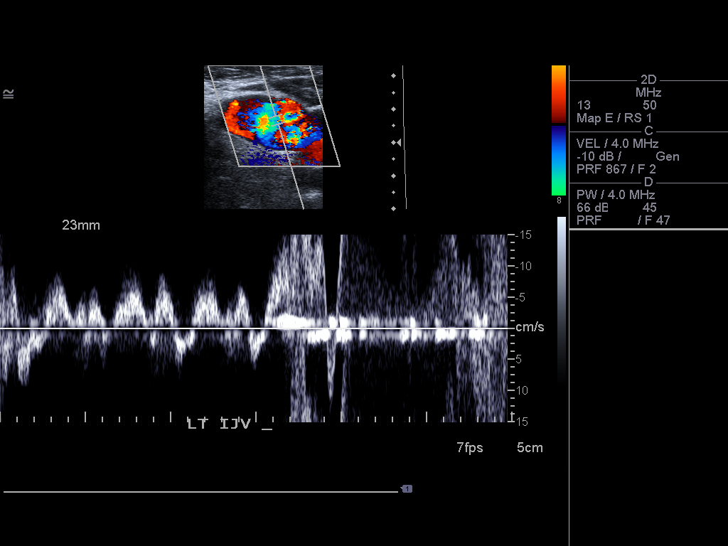
[im 6/33]
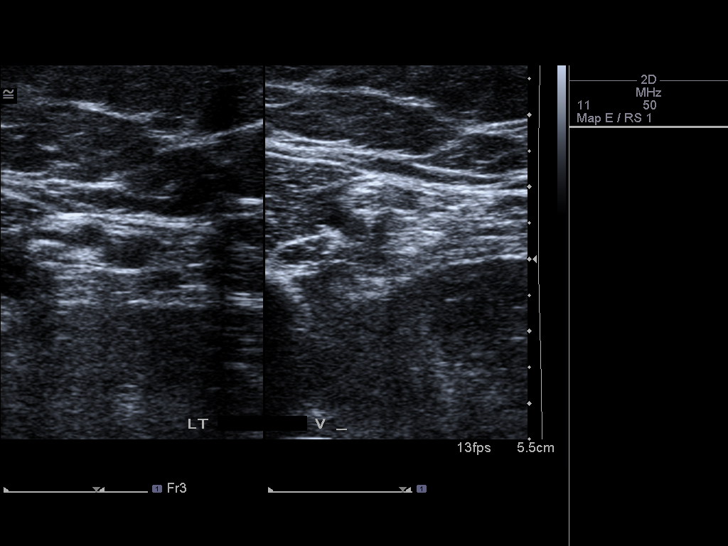
[im 9/33]
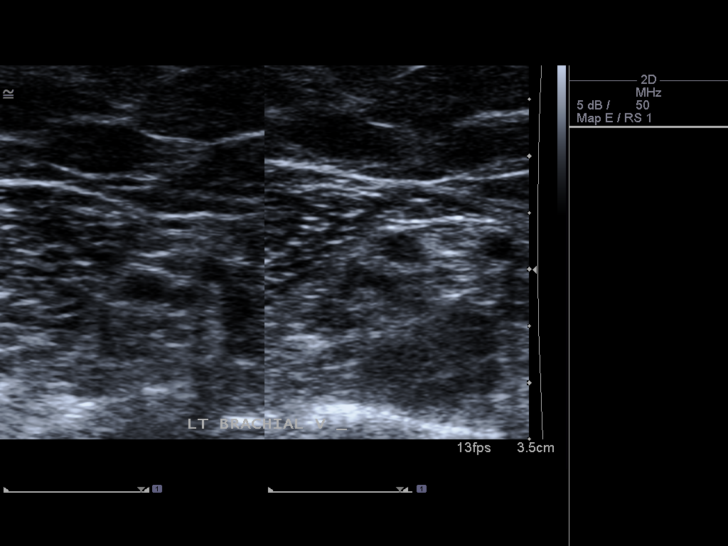
[im 10/33]
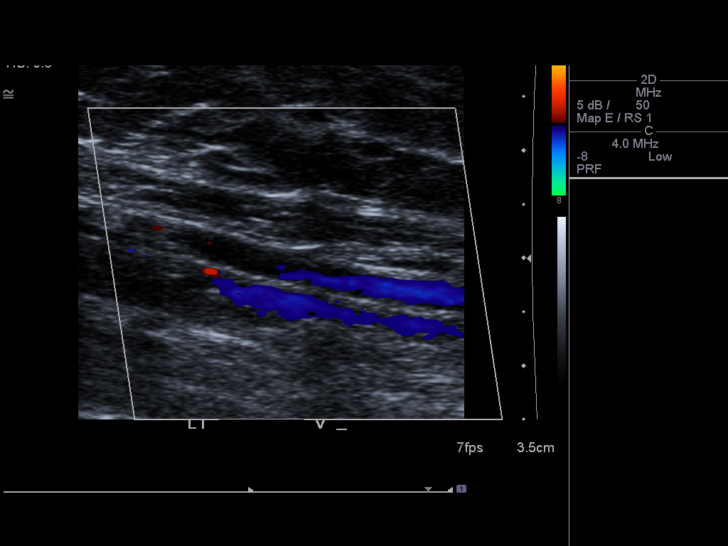
[im 13/33]
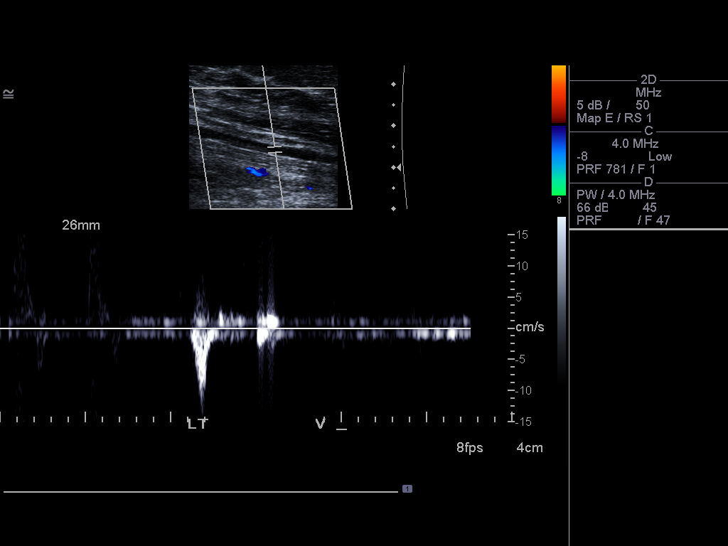
[im 16/33]
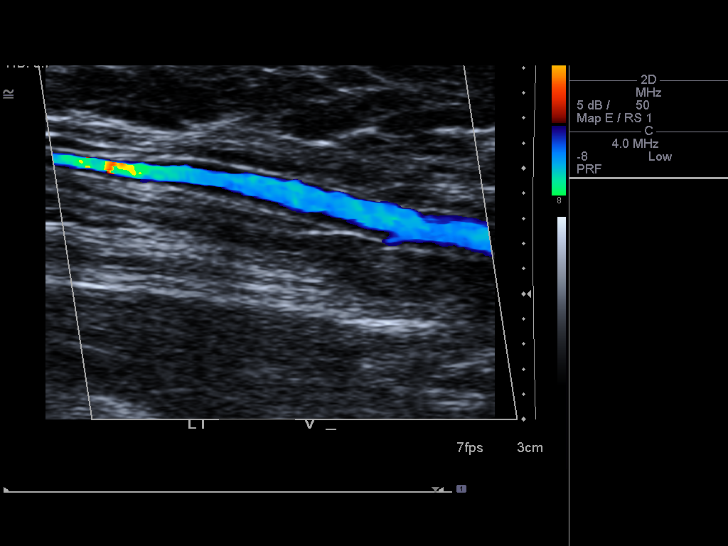
[im 17/33]
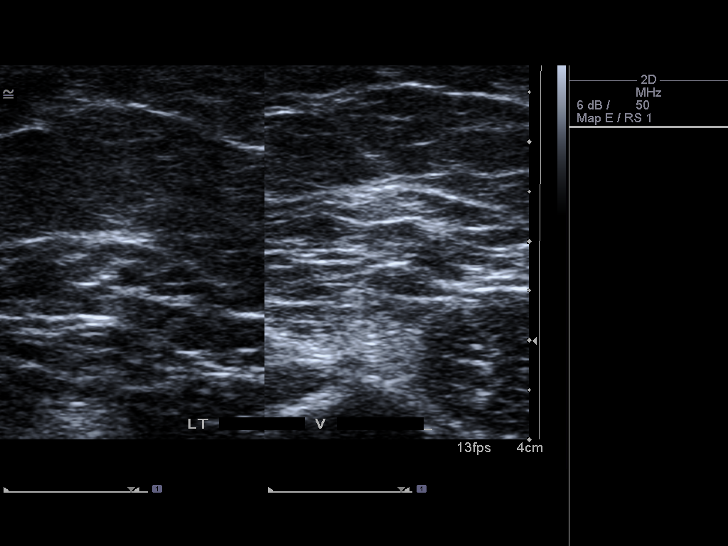
[im 20/33]
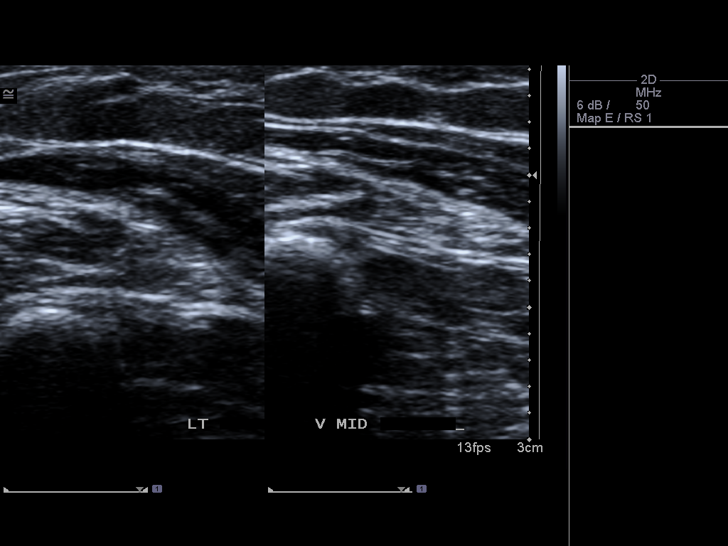
[im 23/33]
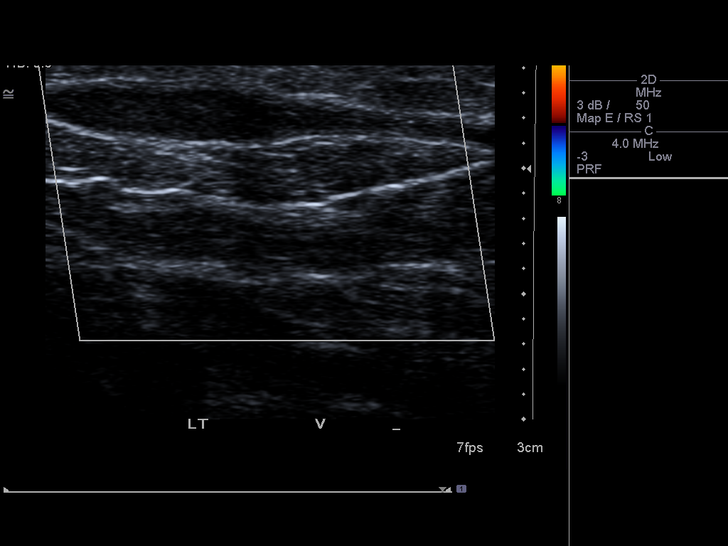
[im 26/33]
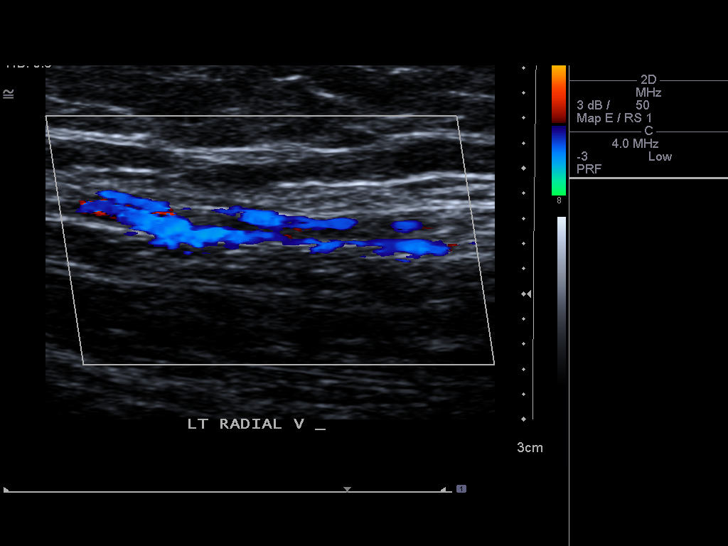
[im 27/33]
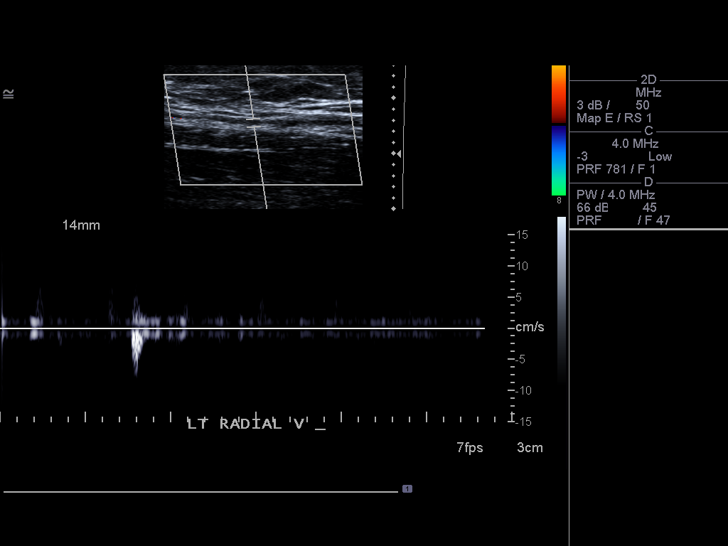
[im 30/33]
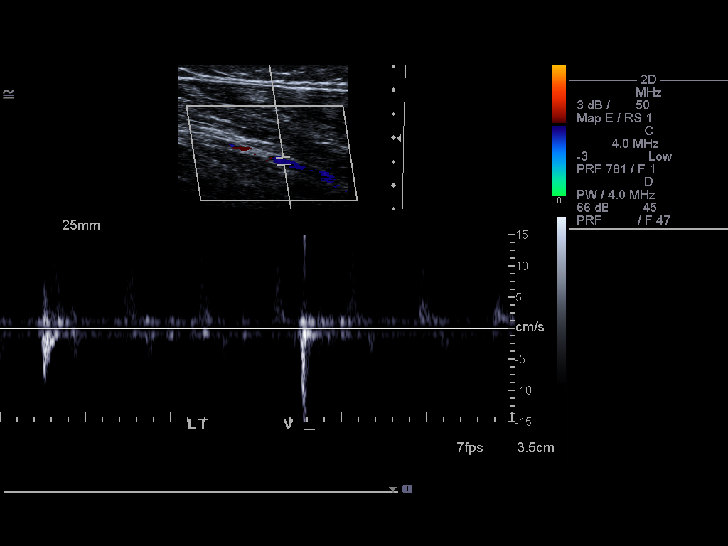
[im 33/33]
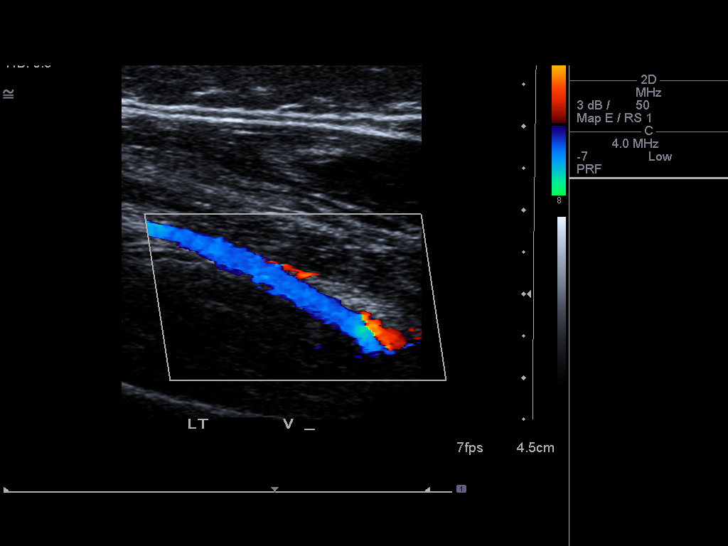

[14 of 24 positions shown; findings below may reference images not displayed]

FINDINGS: Expected appearance of flow, augmentation, and (where
feasible) phasicity and compressibility in the left internal
jugular, subclavian, axillary, basilic, brachial, radial, and ulnar
veins.

Thrombus is present in the cephalic vein from the upper forearm to
the elbow, noncompressible, with no internal flow seen in this
vicinity.
IMPRESSION: 1.  Continued thrombus in the cephalic vein, currently in the upper
forearm and elbow region (previously in the elbow and upper arm).

## 2013-08-17 ENCOUNTER — Emergency Department (HOSPITAL_BASED_OUTPATIENT_CLINIC_OR_DEPARTMENT_OTHER): Payer: Medicare Other

## 2013-08-17 ENCOUNTER — Encounter (HOSPITAL_BASED_OUTPATIENT_CLINIC_OR_DEPARTMENT_OTHER): Payer: Self-pay | Admitting: Emergency Medicine

## 2013-08-17 ENCOUNTER — Emergency Department (HOSPITAL_BASED_OUTPATIENT_CLINIC_OR_DEPARTMENT_OTHER)
Admission: EM | Admit: 2013-08-17 | Discharge: 2013-08-17 | Disposition: A | Discharge status: Home / Self Care | Payer: Medicare Other | Attending: Emergency Medicine | Admitting: Emergency Medicine

## 2013-08-17 DIAGNOSIS — E119 Type 2 diabetes mellitus without complications: Secondary | ICD-10-CM | POA: Insufficient documentation

## 2013-08-17 DIAGNOSIS — J45909 Unspecified asthma, uncomplicated: Secondary | ICD-10-CM | POA: Insufficient documentation

## 2013-08-17 DIAGNOSIS — S8000XA Contusion of unspecified knee, initial encounter: Secondary | ICD-10-CM | POA: Insufficient documentation

## 2013-08-17 DIAGNOSIS — Y9389 Activity, other specified: Secondary | ICD-10-CM | POA: Insufficient documentation

## 2013-08-17 DIAGNOSIS — Y9289 Other specified places as the place of occurrence of the external cause: Secondary | ICD-10-CM | POA: Insufficient documentation

## 2013-08-17 DIAGNOSIS — S8002XA Contusion of left knee, initial encounter: Secondary | ICD-10-CM

## 2013-08-17 DIAGNOSIS — W19XXXA Unspecified fall, initial encounter: Secondary | ICD-10-CM

## 2013-08-17 DIAGNOSIS — Z880 Allergy status to penicillin: Secondary | ICD-10-CM | POA: Insufficient documentation

## 2013-08-17 DIAGNOSIS — R296 Repeated falls: Secondary | ICD-10-CM | POA: Insufficient documentation

## 2013-08-17 DIAGNOSIS — Z23 Encounter for immunization: Secondary | ICD-10-CM | POA: Insufficient documentation

## 2013-08-17 DIAGNOSIS — G40909 Epilepsy, unspecified, not intractable, without status epilepticus: Secondary | ICD-10-CM | POA: Insufficient documentation

## 2013-08-17 DIAGNOSIS — E785 Hyperlipidemia, unspecified: Secondary | ICD-10-CM | POA: Insufficient documentation

## 2013-08-17 DIAGNOSIS — Z86718 Personal history of other venous thrombosis and embolism: Secondary | ICD-10-CM | POA: Insufficient documentation

## 2013-08-17 DIAGNOSIS — Z79899 Other long term (current) drug therapy: Secondary | ICD-10-CM | POA: Insufficient documentation

## 2013-08-17 DIAGNOSIS — Z7982 Long term (current) use of aspirin: Secondary | ICD-10-CM | POA: Insufficient documentation

## 2013-08-17 MED ORDER — TETANUS-DIPHTH-ACELL PERTUSSIS 5-2.5-18.5 LF-MCG/0.5 IM SUSP
0.5000 mL | Freq: Once | INTRAMUSCULAR | Status: AC
  Administered 2013-08-17: 0.5 mL via INTRAMUSCULAR
  Filled 2013-08-17: qty 0.5

## 2013-08-17 NOTE — ED Provider Notes (Signed)
CSN: [id number]     Arrival date & time 08/17/13  1246 History   First MD Initiated Contact with Patient 08/17/13 1419     Chief Complaint  Patient presents with  . Fall   (Consider location/radiation/quality/duration/timing/severity/associated sxs/prior Treatment) HPI  This is a 67 year old female who fell 2 days ago going [place] the beach. She landed on her left knee. She has been able to walk on it without difficulty. She cleaned it with copious water and alcohol at the time. She has continued to have pain has continued to build walk on it without difficulty. She denies any other injury including head, neck, or hip. She has a history of DVT but not on any blood thinners  she does not know when her last tetanus shot was.  Past Medical History  Diagnosis Date  . Hyperlipidemia   . Allergy   . Diabetes mellitus   . Asthma   . Atypical seizure 06/07/2012  . DVT (deep venous thrombosis)   . Olfactory aura    Past Surgical History  Procedure Laterality Date  . Appendectomy    . Tonsillectomy     . Cholecystectomy    . Colonoscopy  2006    [facility]   Family History  Problem Relation Age of Onset  . Stomach cancer Father   . Coronary artery disease Father   . Heart disease Father   . Breast cancer Sister     breast cancer  . Diabetes Maternal Aunt   . Diabetes Paternal Aunt   . Stroke Neg Hx   . Prostate cancer Brother    History  Substance Use Topics  . Smoking status: Never Smoker   . Smokeless tobacco: Never Used  . Alcohol Use: 4.2 oz/week    7 Glasses of wine per week   OB History   Grav Para Term Preterm Abortions TAB SAB Ect Mult Living                 Review of Systems  All other systems reviewed and are negative.    Allergies  Mushroom ext cmplx(shiitake-reishi-mait); Shellfish allergy; Sulfonamide derivatives; Acetaminophen; Butalbital-aspirin-caffeine; Clarithromycin; Fluticasone-salmeterol; Montelukast sodium; Moxifloxacin; Orphenadrine  citrate; and Penicillins  Home Medications   Current Outpatient Rx  Name  Route  Sig  Dispense  Refill  . aspirin EC 81 MG tablet   Oral   Take 81 mg by mouth daily.         Marland Kitchen buPROPion (WELLBUTRIN SR) 150 MG 12 hr tablet   Oral   Take 1 tablet (150 mg total) by mouth 2 (two) times daily.   180 tablet   1   . HYDROcodone-acetaminophen (NORCO/VICODIN) 5-325 MG per tablet   Oral   Take 1 tablet by mouth every 6 (six) hours as needed for pain.         . LamoTRIgine (LAMICTAL XR) 100 MG TB24   Oral   Take 200 mg by mouth daily.         . Linagliptin-Metformin HCl (JENTADUETO) 2.5-500 MG TABS   Oral   Take 1 tablet by mouth. TAKE ONLY IF BS ABOVE 150 FASTING         . loratadine (CLARITIN) 10 MG tablet   Oral   Take 10 mg by mouth daily.           . Multiple Vitamins-Minerals (MULTIVITAMIN & MINERAL PO)   Oral   Take 1 tablet by mouth daily.         Marland Kitchen  orlistat (ALLI) 60 MG capsule   Oral   Take 60 mg by mouth daily.         Marland Kitchen triamcinolone ointment (KENALOG) 0.1 %   Topical   Apply topically 2 (two) times daily.   90 g   1   . valACYclovir (VALTREX) 1000 MG tablet   Oral   Take 1 tablet (1,000 mg total) by mouth 3 (three) times daily.   21 tablet   0    BP 141/60  Pulse 77  Temp(Src) 98.7 F (37.1 C) (Oral)  Resp 20  SpO2 99% Physical Exam  Nursing note and vitals reviewed. Constitutional: She is oriented to person, place, and time. She appears well-developed and well-nourished.  HENT:  Head: Normocephalic and atraumatic.  Right Ear: External ear normal.  Left Ear: External ear normal.  Eyes: Conjunctivae and EOM are normal. Pupils are equal, round, and reactive to light.  Neck: Normal range of motion. Neck supple.  Cardiovascular: Normal rate.   Pulmonary/Chest: Effort normal and breath sounds normal.  Abdominal: Soft. Bowel sounds are normal.  Musculoskeletal: Normal range of motion.  Patient with contusion anterior aspect of left  knee with associated abrasion. She has full active range of motion of the left knee. She is tender palpation anteriorly over the knee and medially proximal to the knee. There is some erythema of the lower leg. Her ankle is soft and nontender with full active range of motion. There is no tenderness palpated over the hip and she has full active range of motion of the left hip.  Neurological: She is alert and oriented to person, place, and time.  Skin: Skin is warm and dry.  Psychiatric: She has a normal mood and affect. Her behavior is normal. Judgment and thought content normal.    ED Course  Procedures (including critical care time) Labs Review Labs Reviewed - No data to display Imaging Review Dg Knee Complete 4 Views Left  08/17/2013   CLINICAL DATA:  Fall. Knee pain.  EXAM: LEFT KNEE - COMPLETE 4+ VIEW  COMPARISON:  No fracture or acute finding.  FINDINGS: No fracture. The knee joint is normally aligned. There is mild medial joint space compartment narrowing with small marginal osteophytes.  No joint effusion. The soft tissues demonstrate mild anterior edema but no radiopaque foreign body.  IMPRESSION: No fracture or dislocation. No radiopaque foreign body.   Electronically Signed   By: Amie Portland M.D.   On: 08/17/2013 13:33    EKG Interpretation   None       MDM  No diagnosis found. This is a 67 year old female with multiple health problems [place] on her left knee and has a contusion and abrasion of her left knee. She has been able to ambulate on it and does not appear to have any fracture seen on x-Yunior Jain. She is advised to not weight bear weight on this. She is advised to elevate it and use cold therapy over the weekend. She is referred for followup to Dr. Pearletha Forge. She is given tetanus here in emergency department.  Hilario Quarry, MD 08/17/13 [phone]

## 2013-08-17 NOTE — ED Notes (Addendum)
Patient missed a step on Wednesday and fell hurting her knee.  Denies hitting her head.  Patient has a wound to the left knee from the fall also bruising noted

## 2013-08-20 ENCOUNTER — Encounter: Payer: Self-pay | Admitting: Family Medicine

## 2013-08-20 ENCOUNTER — Ambulatory Visit (INDEPENDENT_AMBULATORY_CARE_PROVIDER_SITE_OTHER): Payer: Medicare Other | Admitting: Family Medicine

## 2013-08-20 VITALS — BP 129/82 | HR 84 | Ht 61.0 in | Wt 250.0 lb

## 2013-08-20 DIAGNOSIS — M25562 Pain in left knee: Secondary | ICD-10-CM

## 2013-08-20 DIAGNOSIS — M25569 Pain in unspecified knee: Secondary | ICD-10-CM

## 2013-08-20 NOTE — Patient Instructions (Signed)
Ice the area 15 minutes at a time 3-4 times a day. Elevate above the level of your heart as much as possible. Tylenol 500mg  1-2 tabs three times a day as needed for pain. Ok to take ibuprofen 600mg  three times a day with food for pain and inflammation as needed in addition to this. Make sure you get up and walk around every 2 hours at home and when flying every hour walk the aisle twice. Do simple calf stretching, motion exercises when sitting as well. If you can tolerate the compression stockings when you fly, wear them. Avoid sedating medications (like benadryl) and alcohol while flying. This can take several weeks to improve (knee contusion, bruising) though it should do so without any further treatment than the above. Follow up with me in 3-4 weeks for reevaluation.

## 2013-08-22 ENCOUNTER — Encounter: Payer: Self-pay | Admitting: Family Medicine

## 2013-08-22 DIAGNOSIS — S8992XA Unspecified injury of left lower leg, initial encounter: Secondary | ICD-10-CM | POA: Insufficient documentation

## 2013-08-22 HISTORY — DX: Unspecified injury of left lower leg, initial encounter: S89.92XA

## 2013-08-22 NOTE — Assessment & Plan Note (Signed)
2/2 contusion.  Radiographs negative.  Reassured patient.  Icing, elevation, tylenol.  Ibuprofen if needed in addition to that.  Encouraged her to be up at least every 1-2 hours especially given prior h/o DVT.  Has SCDs - advised to wear if she can tolerate these.  Going on a trip to [place] as well - shown exercises to do during flight, to walk aisle every hour.  Avoid sedating medication, alcohol as well on flight.

## 2013-08-22 NOTE — Progress Notes (Signed)
Patient ID: Nicole Mccann, female   DOB: 1945/12/25, 67 y.o.   MRN: [id number]  PCP: Marga Melnick, MD  Subjective:   HPI: Patient is a 67 y.o. female here for left knee injury.  Patient reports she was at the beach on 10/8 near bottom of steps. There was a landing a few inches lower than last step that she didn't see - fell directly onto her left knee. + swelling, bruising. Difficulty walking - using a cane. Had radiographs on Friday negative for fracture. Has been icing and elevating. No prior knee surgeries, injuries.  Past Medical History  Diagnosis Date  . Hyperlipidemia   . Allergy   . Diabetes mellitus   . Asthma   . Atypical seizure 06/07/2012  . DVT (deep venous thrombosis)   . Olfactory aura     Current Outpatient Prescriptions on File Prior to Visit  Medication Sig Dispense Refill  . aspirin EC 81 MG tablet Take 81 mg by mouth daily.      Marland Kitchen buPROPion (WELLBUTRIN SR) 150 MG 12 hr tablet Take 1 tablet (150 mg total) by mouth 2 (two) times daily.  180 tablet  1  . HYDROcodone-acetaminophen (NORCO/VICODIN) 5-325 MG per tablet Take 1 tablet by mouth every 6 (six) hours as needed for pain.      . LamoTRIgine (LAMICTAL XR) 100 MG TB24 Take 200 mg by mouth daily.      . Linagliptin-Metformin HCl (JENTADUETO) 2.5-500 MG TABS Take 1 tablet by mouth. TAKE ONLY IF BS ABOVE 150 FASTING      . loratadine (CLARITIN) 10 MG tablet Take 10 mg by mouth daily.        . Multiple Vitamins-Minerals (MULTIVITAMIN & MINERAL PO) Take 1 tablet by mouth daily.      Marland Kitchen orlistat (ALLI) 60 MG capsule Take 60 mg by mouth daily.      Marland Kitchen triamcinolone ointment (KENALOG) 0.1 % Apply topically 2 (two) times daily.  90 g  1  . valACYclovir (VALTREX) 1000 MG tablet Take 1 tablet (1,000 mg total) by mouth 3 (three) times daily.  21 tablet  0   No current facility-administered medications on file prior to visit.    Past Surgical History  Procedure Laterality Date  . Appendectomy    . Tonsillectomy      . Cholecystectomy    . Colonoscopy  2006    [facility]    Allergies  Allergen Reactions  . Mushroom Ext Cmplx(Shiitake-Reishi-Mait) Anaphylaxis  . Shellfish Allergy Anaphylaxis  . Sulfonamide Derivatives Anaphylaxis    unknown  . Acetaminophen     REACTION: STOMACH DISCOMFORT  . Butalbital-Aspirin-Caffeine     unknown  . Clarithromycin Nausea And Vomiting  . Fluticasone-Salmeterol     Feels like something in the throat  . Montelukast Sodium     unknown  . Moxifloxacin     unknown  . Orphenadrine Citrate     unknown  . Penicillins     unknown    History   Social History  . Marital Status: Married    Spouse Name: N/A    Number of Children: N/A  . Years of Education: N/A   Occupational History  . Not on file.   Social History Main Topics  . Smoking status: Never Smoker   . Smokeless tobacco: Never Used  . Alcohol Use: 4.2 oz/week    7 Glasses of wine per week  . Drug Use: No  . Sexual Activity: Not on file   Other Topics  Concern  . Not on file   Social History Narrative  . No narrative on file    Family History  Problem Relation Age of Onset  . Stomach cancer Father   . Coronary artery disease Father   . Heart disease Father   . Breast cancer Sister     breast cancer  . Diabetes Maternal Aunt   . Diabetes Paternal Aunt   . Stroke Neg Hx   . Prostate cancer Brother     BP 129/82  Pulse 84  Ht 5\' 1"  (1.549 m)  Wt 250 lb (113.399 kg)  BMI 47.26 kg/m2  Review of Systems: See HPI above.    Objective:  Physical Exam:  Gen: NAD  L knee: Mod swelling, bruising anterior knee down to proximal shin.  Some bruising posterior knee as well.  No other deformity. TTP throughout this entire area.   ROM 0 - 90 degrees, painful throughout. Negative ant/post drawers. Negative valgus/varus testing. Negative lachmanns. Pain anteriorly with apleys and mcmurrays.  Negative patellar apprehension. NV intact distally.    Assessment & Plan:  1. Left  knee pain - 2/2 contusion.  Radiographs negative.  Reassured patient.  Icing, elevation, tylenol.  Ibuprofen if needed in addition to that.  Encouraged her to be up at least every 1-2 hours especially given prior h/o DVT.  Has SCDs - advised to wear if she can tolerate these.  Going on a trip to [place] as well - shown exercises to do during flight, to walk aisle every hour.  Avoid sedating medication, alcohol as well on flight.

## 2013-09-03 ENCOUNTER — Encounter: Payer: Self-pay | Admitting: Internal Medicine

## 2013-09-10 ENCOUNTER — Emergency Department (HOSPITAL_BASED_OUTPATIENT_CLINIC_OR_DEPARTMENT_OTHER)
Admission: EM | Admit: 2013-09-10 | Discharge: 2013-09-11 | Disposition: A | Discharge status: Home / Self Care | Payer: Medicare Other | Attending: Emergency Medicine | Admitting: Emergency Medicine

## 2013-09-10 ENCOUNTER — Encounter (HOSPITAL_BASED_OUTPATIENT_CLINIC_OR_DEPARTMENT_OTHER): Payer: Self-pay | Admitting: Emergency Medicine

## 2013-09-10 DIAGNOSIS — L0390 Cellulitis, unspecified: Secondary | ICD-10-CM

## 2013-09-10 DIAGNOSIS — Z87828 Personal history of other (healed) physical injury and trauma: Secondary | ICD-10-CM | POA: Insufficient documentation

## 2013-09-10 DIAGNOSIS — Z7982 Long term (current) use of aspirin: Secondary | ICD-10-CM | POA: Insufficient documentation

## 2013-09-10 DIAGNOSIS — Z86718 Personal history of other venous thrombosis and embolism: Secondary | ICD-10-CM | POA: Insufficient documentation

## 2013-09-10 DIAGNOSIS — J45909 Unspecified asthma, uncomplicated: Secondary | ICD-10-CM | POA: Insufficient documentation

## 2013-09-10 DIAGNOSIS — G40909 Epilepsy, unspecified, not intractable, without status epilepticus: Secondary | ICD-10-CM | POA: Insufficient documentation

## 2013-09-10 DIAGNOSIS — R609 Edema, unspecified: Secondary | ICD-10-CM | POA: Insufficient documentation

## 2013-09-10 DIAGNOSIS — Z79899 Other long term (current) drug therapy: Secondary | ICD-10-CM | POA: Insufficient documentation

## 2013-09-10 DIAGNOSIS — L02419 Cutaneous abscess of limb, unspecified: Secondary | ICD-10-CM | POA: Insufficient documentation

## 2013-09-10 DIAGNOSIS — E119 Type 2 diabetes mellitus without complications: Secondary | ICD-10-CM | POA: Insufficient documentation

## 2013-09-10 DIAGNOSIS — E785 Hyperlipidemia, unspecified: Secondary | ICD-10-CM | POA: Insufficient documentation

## 2013-09-10 DIAGNOSIS — IMO0002 Reserved for concepts with insufficient information to code with codable children: Secondary | ICD-10-CM | POA: Insufficient documentation

## 2013-09-10 DIAGNOSIS — Z880 Allergy status to penicillin: Secondary | ICD-10-CM | POA: Insufficient documentation

## 2013-09-10 DIAGNOSIS — E669 Obesity, unspecified: Secondary | ICD-10-CM | POA: Insufficient documentation

## 2013-09-10 NOTE — ED Notes (Signed)
Pt c/o left lower leg wound infection , seen in [place] while on vacation for same with surgery pt reports left lower leg worse

## 2013-09-11 ENCOUNTER — Encounter: Payer: Self-pay | Admitting: Internal Medicine

## 2013-09-11 ENCOUNTER — Ambulatory Visit (INDEPENDENT_AMBULATORY_CARE_PROVIDER_SITE_OTHER): Payer: Medicare Other | Admitting: Family Medicine

## 2013-09-11 ENCOUNTER — Emergency Department (HOSPITAL_BASED_OUTPATIENT_CLINIC_OR_DEPARTMENT_OTHER): Payer: Medicare Other

## 2013-09-11 ENCOUNTER — Encounter: Payer: Self-pay | Admitting: Family Medicine

## 2013-09-11 ENCOUNTER — Ambulatory Visit (INDEPENDENT_AMBULATORY_CARE_PROVIDER_SITE_OTHER): Payer: Medicare Other | Admitting: Internal Medicine

## 2013-09-11 ENCOUNTER — Ambulatory Visit: Payer: Medicare Other | Admitting: Family Medicine

## 2013-09-11 VITALS — BP 132/78 | HR 75 | Ht 61.0 in | Wt 262.0 lb

## 2013-09-11 VITALS — BP 160/84 | HR 85 | Temp 98.7°F | Resp 14

## 2013-09-11 DIAGNOSIS — S8992XD Unspecified injury of left lower leg, subsequent encounter: Secondary | ICD-10-CM

## 2013-09-11 DIAGNOSIS — Z5189 Encounter for other specified aftercare: Secondary | ICD-10-CM

## 2013-09-11 DIAGNOSIS — S8992XS Unspecified injury of left lower leg, sequela: Secondary | ICD-10-CM

## 2013-09-11 DIAGNOSIS — IMO0001 Reserved for inherently not codable concepts without codable children: Secondary | ICD-10-CM

## 2013-09-11 LAB — CBC WITH DIFFERENTIAL/PLATELET
Basophils Absolute: 0 10*3/uL (ref 0.0–0.1)
HCT: 39.8 % (ref 36.0–46.0)
Hemoglobin: 12.9 g/dL (ref 12.0–15.0)
Lymphocytes Relative: 23 % (ref 12–46)
Monocytes Absolute: 0.8 10*3/uL (ref 0.1–1.0)
Neutro Abs: 4.9 10*3/uL (ref 1.7–7.7)
RBC: 4.26 MIL/uL (ref 3.87–5.11)
RDW: 13 % (ref 11.5–15.5)
WBC: 7.9 10*3/uL (ref 4.0–10.5)

## 2013-09-11 LAB — PROTIME-INR: INR: 1 (ref 0.00–1.49)

## 2013-09-11 LAB — BASIC METABOLIC PANEL
CO2: 30 mEq/L (ref 19–32)
Chloride: 103 mEq/L (ref 96–112)
Creatinine, Ser: 0.8 mg/dL (ref 0.50–1.10)

## 2013-09-11 MED ORDER — CLINDAMYCIN HCL 150 MG PO CAPS: 300.0000 mg | ORAL_CAPSULE | Freq: Four times a day (QID) | ORAL | Status: DC

## 2013-09-11 MED ORDER — CLINDAMYCIN HCL 150 MG PO CAPS
ORAL_CAPSULE | ORAL | Status: AC
  Filled 2013-09-11: qty 2

## 2013-09-11 MED ORDER — CLINDAMYCIN HCL 150 MG PO CAPS
300.0000 mg | ORAL_CAPSULE | Freq: Once | ORAL | Status: AC
  Administered 2013-09-11: 300 mg via ORAL

## 2013-09-11 MED ORDER — CLINDAMYCIN HCL 150 MG PO CAPS: 150.0000 mg | ORAL_CAPSULE | Freq: Four times a day (QID) | ORAL | Status: DC

## 2013-09-11 NOTE — Patient Instructions (Signed)
Your next office appointment will be determined based upon review of your response to Clindamycin.Followup as needed for your this acute issue. Please report any significant change in your symptoms. Please take the probiotic , Align, every day if the bowels are loose. This will replace the normal bacteria which  are necessary for formation of normal stool and processing of food.

## 2013-09-11 NOTE — ED Notes (Signed)
MD at bedside. 

## 2013-09-11 NOTE — Assessment & Plan Note (Signed)
Monitor of the left knee by Dr Pearletha Forge is indicated to rule out significant effusion. Clindamycin is appropriate in view of the cellulitis. Attempts will be made to obtain the results of the cultures of the incision and drainage of the hematoma of the left knee. Align if diarrhea on Clindamycin. Low threshold for ID referral Translation of the hospital records from [place] will be obtained if possible.

## 2013-09-11 NOTE — Patient Instructions (Addendum)
Continue current antibiotic. You do not have a septic joint currently but you do have cellulitis. There is no additional fluid in your knee joint, only in the soft tissue consistent with cellulitis. If you get fevers (above 100.4) or redness spreads further toward the upper thigh with streaking, either seek care in the emergency department or follow up with Dr. Alwyn Ren.  At that point you may have to see the infectious disease specialists. The most important labwork is not in your current documentation - a wound culture and/or blood culture results from the knee if that was obtained.

## 2013-09-11 NOTE — ED Provider Notes (Signed)
CSN: [id number]     Arrival date & time 09/10/13  2346 History   None    This chart was scribed for Shon Baton, MD by Arlan Organ, ED Scribe. This patient was seen in room MH02/MH02 and the patient's care was started 12:13 AM.  Chief Complaint  Patient presents with  . Leg Swelling   The history is provided by the patient. No language interpreter was used.   HPI Comments: Nicole Mccann is a 67 y.o. female who presents to the Emergency Department complaining of a wound infection. Pt states she went to the beach with family, and fell down the stairs, landing on her knee. She says her wound was cared for shortly after the fall. She states that 3 days after, she noticed swelling. She was treated, and was cleared to continue on with her normal activities, and to go on a trip to [place], followed by a cruise to [place]. She says while on the cruise, she noticed swelling in the effected area. She was treated at a local ER, but was told she needed to follow up. At a 2nd ER, pt states she was told she needed surgery and that she had a broken bone and possible infection.  She was then told she needed to be admitted to the local hospital for surgery and treatment. Pt states her surgery was  10/27. She reportedly had a hematoma of the left knee that was drained operatively.  Patient states that she has been on antibiotics and received Lovenox shots. She was cleared for travel and has just returned home after a 12 hour plane flight from [place].  Patient reports that while on the airplane, she had increasing swelling and warmth to the left lower extremity.  She's not currently on antibiotics.   Past Medical History  Diagnosis Date  . Hyperlipidemia   . Allergy   . Diabetes mellitus   . Asthma   . Atypical seizure 06/07/2012  . DVT (deep venous thrombosis)   . Olfactory aura    Past Surgical History  Procedure Laterality Date  . Appendectomy    . Tonsillectomy     . Cholecystectomy     . Colonoscopy  2006    [facility]   Family History  Problem Relation Age of Onset  . Stomach cancer Father   . Coronary artery disease Father   . Heart disease Father   . Breast cancer Sister     breast cancer  . Diabetes Maternal Aunt   . Diabetes Paternal Aunt   . Stroke Neg Hx   . Prostate cancer Brother    History  Substance Use Topics  . Smoking status: Never Smoker   . Smokeless tobacco: Never Used  . Alcohol Use: 4.2 oz/week    7 Glasses of wine per week   OB History   Grav Para Term Preterm Abortions TAB SAB Ect Mult Living                 Review of Systems  Constitutional: Negative for fever.  Respiratory: Negative for shortness of breath.   Cardiovascular: Positive for leg swelling. Negative for chest pain.  Musculoskeletal:       Left knee pain  Skin: Positive for color change.       Warmth and redness to left lower extremity  All other systems reviewed and are negative.    Allergies  Mushroom ext cmplx(shiitake-reishi-mait); Shellfish allergy; Sulfonamide derivatives; Acetaminophen; Butalbital-aspirin-caffeine; Clarithromycin; Fluticasone-salmeterol; Montelukast sodium; Moxifloxacin; Orphenadrine citrate;  and Penicillins  Home Medications   Current Outpatient Rx  Name  Route  Sig  Dispense  Refill  . aspirin EC 81 MG tablet   Oral   Take 81 mg by mouth daily.         Marland Kitchen buPROPion (WELLBUTRIN SR) 150 MG 12 hr tablet   Oral   Take 1 tablet (150 mg total) by mouth 2 (two) times daily.   180 tablet   1   . clindamycin (CLEOCIN) 150 MG capsule   Oral   Take 2 capsules (300 mg total) by mouth every 6 (six) hours.   56 capsule   0   . HYDROcodone-acetaminophen (NORCO/VICODIN) 5-325 MG per tablet   Oral   Take 1 tablet by mouth every 6 (six) hours as needed for pain.         . LamoTRIgine (LAMICTAL XR) 100 MG TB24   Oral   Take 200 mg by mouth daily.         . Linagliptin-Metformin HCl (JENTADUETO) 2.5-500 MG TABS   Oral   Take 1  tablet by mouth. TAKE ONLY IF BS ABOVE 150 FASTING         . loratadine (CLARITIN) 10 MG tablet   Oral   Take 10 mg by mouth daily.           . Multiple Vitamins-Minerals (MULTIVITAMIN & MINERAL PO)   Oral   Take 1 tablet by mouth daily.         Marland Kitchen orlistat (ALLI) 60 MG capsule   Oral   Take 60 mg by mouth daily.         Marland Kitchen triamcinolone ointment (KENALOG) 0.1 %   Topical   Apply topically 2 (two) times daily.   90 g   1   . valACYclovir (VALTREX) 1000 MG tablet   Oral   Take 1 tablet (1,000 mg total) by mouth 3 (three) times daily.   21 tablet   0    BP 190/73  Pulse 72  Temp(Src) 97.8 F (36.6 C) (Oral)  Resp 20  Ht 5\' 1"  (1.549 m)  Wt 250 lb (113.399 kg)  BMI 47.26 kg/m2  SpO2 100%  Physical Exam  Nursing note and vitals reviewed. Constitutional: She is oriented to person, place, and time.  Elderly, obese, no acute distress  HENT:  Head: Normocephalic and atraumatic.  Cardiovascular: Normal rate, regular rhythm and normal heart sounds.   No murmur heard. Pulmonary/Chest: Effort normal. No respiratory distress.  Abdominal: Soft. Bowel sounds are normal. There is no tenderness.  Musculoskeletal:  mild erythema and swelling to the left knee that extends just inferior to the knee. There are 2 surgical incisions that are clean dry and intact with sutures in place anterior and just lateral to the knee. Tenderness to palpation of the calf.  Full active and passive range of motion of the knee without pain.  Neurological: She is alert and oriented to person, place, and time.  Skin: Skin is warm and dry. There is erythema.  Psychiatric: She has a normal mood and affect.    ED Course  Procedures (including critical care time)  DIAGNOSTIC STUDIES: Oxygen Saturation is 100% on RA, Normal by my interpretation.    COORDINATION OF CARE: 12:13 AM- Will order imaging and blood work. Discussed treatment plan with pt at bedside and pt agreed to plan.     Labs  Review Labs Reviewed  BASIC METABOLIC PANEL - Abnormal; Notable for the following:  Glucose, Bld 116 (*)    GFR calc non Af Amer 74 (*)    GFR calc Af Amer 85 (*)    All other components within normal limits  PROTIME-INR  CBC WITH DIFFERENTIAL   Imaging Review US Venous Img Lower Unilateral Left  09/11/2013   CLINICAL DATA:  67 year old female with wound infection and swelling. History of prior DVT. Pain and edema.  EXAM: VENOUS DOPPLER ULTRASOUND OF LEFT LOWER EXTREMITY  TECHNIQUE: Gray-scale sonography with graded compression, as well as color Doppler and duplex ultrasound, were performed to evaluate the deep venous system from the level of the common femoral vein through the popliteal and proximal calf veins. Spectral Doppler was utilized to evaluate flow at rest and with distal augmentation maneuvers.  COMPARISON:  None.  FINDINGS: Thrombus within deep veins:  None visualized.  Compressibility of deep veins:  Normal.  Duplex waveform respiratory phasicity:  Normal.  Duplex waveform response to augmentation:  Normal.  Venous reflux:  None visualized.  Other findings: Visible greater saphenous vein appears compressible and patent.  Some subcutaneous edema demonstrated.  IMPRESSION: No evidence of left lower extremity deep venous thrombosis.   Electronically Signed   By: Augusto Gamble M.D.   On: 09/11/2013 00:52   Dg Knee Complete 4 Views Left  09/11/2013   CLINICAL DATA:  History of fall 2 weeks ago complaining left knee pain.  EXAM: LEFT KNEE - COMPLETE 4+ VIEW  COMPARISON:  08/17/2013.  FINDINGS: Multiple views of the left knee demonstrate no acute displaced fracture, subluxation, dislocation, or soft tissue abnormality. Joint space, subchondral sclerosis and osteophyte formation, most pronounced in the medial compartment, compatible with osteoarthritis.  IMPRESSION: No acute radiographic abnormality of the left knee.   Electronically Signed   By: Trudie Reed M.D.   On: 09/11/2013 01:16     EKG Interpretation   None       MDM   1. Cellulitis   2. Dependent edema    This is a 67 year old female with recent history of left knee injury and subsequent hematoma requiring evacuation who presents with left lower extremity swelling and redness. Patient is currently taking Lovenox following her surgery. She is not on antibiotics. She denies any fevers or systemic symptoms and is clinically well appearing. Patient is at risk for DVT given recent travel and surgery. Ultrasound negative for DVT. Given erythema of f the left lower extremity with associated swelling, will treat the patient for cellulitis that she is not currently on antibiotics. She has a penicillin allergy and will be given clindamycin. I suspect some of the swelling is dependent given her recent plane travel. Patient was advised to elevate both lower extremities. She is to followup with her primary care provider in one day. Patient was given strict return cautions.  After history, exam, and medical workup I feel the patient has been appropriately medically screened and is safe for discharge home. Pertinent diagnoses were discussed with the patient. Patient was given return precautions.   I personally performed the services described in this documentation, which was scribed in my presence. The recorded information has been reviewed and is accurate.   Shon Baton, MD 09/11/13 [phone]

## 2013-09-11 NOTE — ED Notes (Signed)
Patient transported to Ultrasound 

## 2013-09-11 NOTE — ED Notes (Signed)
D/c home with ride. Rx x 1 for clindamycin given

## 2013-09-11 NOTE — Progress Notes (Signed)
  Subjective:    Patient ID: Nicole Mccann, female    DOB: [birth year], 67 y.o.   MRN: [id number]  HPI  Incredibly complex history related to the injury of the left knee and subsequent complications and comorbidities were outlined in the problem list. At this time there is no evidence of deep venous thrombosis or osteomyelitis. She's been prescribed clindamycin by the emergency room at [facility] 11/3.  Apparently the hematoma was cultured while hospitalized in [place].    Review of Systems  Since the incision and drainage of left knee; she is not had fever, chills, or sweats. She does feel somewhat cool today. She also denies dyspnea, pleuritic chest pain, or hemoptysis.     Objective:   Physical Exam Gen.: adequately nourished in appearance.weight excess. Alert, appropriate and cooperative throughout exam.   Eyes: No corneal or conjunctival inflammation noted. No icterus Mouth: Oral mucosa and oropharynx reveal no lesions or exudates. Teeth in good repair. Neck: No deformities, masses, or tenderness noted. . Lungs: Normal respiratory effort; chest expands symmetrically. Lungs are clear to auscultation without rales, wheezes, or increased work of breathing. Heart: Normal rate and rhythm. Normal S1 and S2. No gallop, click, or rub. Heart sounds distant. No murmur.                                  Musculoskeletal/extremities:  No clubbing, cyanosis,  or significant extremity  deformity noted. Trace edema.Range of motion decreased L knee .Fusiform L knee with ? effusion. Homans sign negative Hand joints normal . Fingernail  health good.  Vascular:  dorsalis pedis and  posterior tibial pulses are full and equal. No bruits present. Neurologic: Alert and oriented x3.  Skin: Intact without suspicious lesions or rashes. Erythema over left shin; tender to palpation Lymph: No cervical, axillary lymphadenopathy present. Psych: Mood and affect are normal. Normally interactive                                                                                         Assessment & Plan:  See Current Assessment & Plan in Problem List under specific Diagnosis

## 2013-09-13 ENCOUNTER — Encounter: Payer: Self-pay | Admitting: Family Medicine

## 2013-09-13 ENCOUNTER — Other Ambulatory Visit: Payer: Self-pay

## 2013-09-13 NOTE — Progress Notes (Addendum)
Patient ID: Nicole Mccann, female   DOB: 19-Feb-1946, 67 y.o.   MRN: [id number]  PCP: Marga Melnick, MD  Subjective:   HPI: Patient is a 67 y.o. female here for left knee injury.  10/13: [place] of [place]. There was a landing a few inches lower than last step that she didn't see - fell directly onto her left knee. + swelling, bruising. Difficulty walking - using a cane. Had radiographs on Friday negative for fracture. Has been icing and elevating. No prior knee surgeries, injuries.  11/4: Unfortunately when patient traveled to [place] a little over 2 weeks ago she developed increased swelling, redness and was admitted to hospital after 3 ED visits in different towns. Hospitalized for a total of 10 days - had drainage of infected hematoma and placed on antibiotics. Per records appears she was placed on oral avelox at one point, cipro another point (they have this antibiotic with them). Reviewed all labwork they were given and there is not a wound culture report here. Leg is still red and warm but she has no fevers, redness has not spread. When came back from [place] came straight to ED and was placed on clindamycin.  Past Medical History  Diagnosis Date  . Hyperlipidemia   . Allergy   . Diabetes mellitus   . Asthma   . Atypical seizure 06/07/2012  . DVT (deep venous thrombosis)   . Olfactory aura     Current Outpatient Prescriptions on File Prior to Visit  Medication Sig Dispense Refill  . buPROPion (WELLBUTRIN SR) 150 MG 12 hr tablet Take 1 tablet (150 mg total) by mouth 2 (two) times daily.  180 tablet  1  . LamoTRIgine (LAMICTAL XR) 100 MG TB24 Take 200 mg by mouth daily.      . Linagliptin-Metformin HCl (JENTADUETO) 2.5-500 MG TABS Take 1 tablet by mouth. TAKE ONLY IF BS ABOVE 150 FASTING      . loratadine (CLARITIN) 10 MG tablet Take 10 mg by mouth daily.        . Multiple Vitamins-Minerals (MULTIVITAMIN & MINERAL PO)  Take 1 tablet by mouth daily.       No current facility-administered medications on file prior to visit.    Past Surgical History  Procedure Laterality Date  . Appendectomy    . Tonsillectomy     . Cholecystectomy    . Colonoscopy  2006    [facility]    Allergies  Allergen Reactions  . Mushroom Ext Cmplx(Shiitake-Reishi-Mait) Anaphylaxis  . Shellfish Allergy Anaphylaxis  . Sulfonamide Derivatives Anaphylaxis    unknown  . Acetaminophen     REACTION: STOMACH DISCOMFORT  . Butalbital-Aspirin-Caffeine     unknown  . Clarithromycin Nausea And Vomiting  . Fluticasone-Salmeterol     Feels like something in the throat  . Montelukast Sodium     unknown  . Moxifloxacin     unknown  . Orphenadrine Citrate     unknown  . Penicillins     unknown    History   Social History  . Marital Status: Married    Spouse Name: N/A    Number of Children: N/A  . Years of Education: N/A   Occupational History  . Not on file.   Social History Main Topics  . Smoking status: Never Smoker   . Smokeless tobacco: Never Used  . Alcohol Use: 4.2 oz/week    7 Glasses of wine per week  . Drug  Use: No  . Sexual Activity: Not on file   Other Topics Concern  . Not on file   Social History Narrative  . No narrative on file    Family History  Problem Relation Age of Onset  . Stomach cancer Father   . Coronary artery disease Father   . Heart disease Father   . Breast cancer Sister     breast cancer  . Diabetes Maternal Aunt   . Diabetes Paternal Aunt   . Stroke Neg Hx   . Prostate cancer Brother     BP 132/78  Pulse 75  Ht 5\' 1"  (1.549 m)  Wt 262 lb (118.842 kg)  BMI 49.53 kg/m2  Review of Systems: See HPI above.    Objective:  Physical Exam:  Gen: NAD  L knee: Mild swelling, redness anterior knee.  No obvious effusion. No other deformity.  Warmth compared to right knee.  No other deformity.  2 sutures in place antero lateral knee. TTP throughout this entire area.    ROM 0 - 90 degrees, painful throughout. Negative ant/post drawers. Negative valgus/varus testing. Negative lachmanns. Pain anteriorly with apleys.  Negative patellar apprehension. NV intact distally.    Assessment & Plan:  1. Cellulitis - her hematoma became infected but has since been drained.  Ultrasound confirmed she does not have an effusion, septic joint.  Started with contusion, overlying abrasion.  Continue her clindamycin.  Afebrile currently.  WBC count has declined from around 11K based on bloodwork.  Will try to get ahold of a culture report but one may not have been obtained.  Icing, elevation, tylenol.  Ibuprofen if needed in addition to that.  Compression with ACE wrap if tolerated.  If not improving I would recommend ID referral.  After informed written consent, patient lying supine on exam table I attempted aspiration of fluid pocket anterior knee (following alcohol swab prep, cold spray, 3mL local marcaine) under ultrasound guidance but only able to withdraw 1cc of dark blood, nonpurulent.    Addendum 11/11:  Patient's husband brought a translation of records in to the office that indicate a wound culture showed no growth - they felt this was a hematoma, not an abscess.  He also showed me a picture of her leg from his camera - redness looks significantly better, nearly resolved on clindamycin.

## 2013-09-13 NOTE — Assessment & Plan Note (Signed)
her hematoma became infected but has since been drained.  Ultrasound confirmed she does not have an effusion, septic joint.  Started with contusion, overlying abrasion.  Continue her clindamycin.  Afebrile currently.  WBC count has declined from around 11K based on bloodwork from [place].  Will try to get ahold of a culture report but one may not have been obtained.  Icing, elevation, tylenol.  Ibuprofen if needed in addition to that.  Compression with ACE wrap if tolerated.  If not improving I would recommend ID referral.  After informed written consent, patient lying supine on exam table I attempted aspiration of fluid pocket anterior knee (following alcohol swab prep, cold spray, 3mL local marcaine) under ultrasound guidance but only able to withdraw 1cc of dark blood, nonpurulent.

## 2013-09-16 ENCOUNTER — Encounter: Payer: Self-pay | Admitting: Internal Medicine

## 2013-09-18 ENCOUNTER — Ambulatory Visit (INDEPENDENT_AMBULATORY_CARE_PROVIDER_SITE_OTHER): Payer: Medicare Other | Admitting: Internal Medicine

## 2013-09-18 ENCOUNTER — Encounter: Payer: Self-pay | Admitting: Internal Medicine

## 2013-09-18 VITALS — BP 124/80 | HR 78 | Temp 98.7°F | Wt 257.6 lb

## 2013-09-18 DIAGNOSIS — IMO0001 Reserved for inherently not codable concepts without codable children: Secondary | ICD-10-CM

## 2013-09-18 DIAGNOSIS — R194 Change in bowel habit: Secondary | ICD-10-CM

## 2013-09-18 DIAGNOSIS — R198 Other specified symptoms and signs involving the digestive system and abdomen: Secondary | ICD-10-CM

## 2013-09-18 DIAGNOSIS — T887XXA Unspecified adverse effect of drug or medicament, initial encounter: Secondary | ICD-10-CM

## 2013-09-18 DIAGNOSIS — S8992XS Unspecified injury of left lower leg, sequela: Secondary | ICD-10-CM

## 2013-09-18 NOTE — Progress Notes (Signed)
  Subjective:    Patient ID: Nicole Mccann, female    DOB: [birth year], 67 y.o.   MRN: [id number]  HPI    She is not having fever. Her husband was able to get an [place] translation of the hospital records from [place]. A swab of the wound of the left knee revealed no growth. She was felt to have a hematoma without abscess formation.   Dr. Lazaro Arms office visit was reviewed. He was able to aspirate only 1 cc of old blood he with ultrasound guidance. He did remove the sutures from the left knee. He found no mechanical impairment with various orthopedic maneuvers.  She is not having fever at this time. Tonight is her last dose of clindamycin.     Review of Systems The last 2 days she's had loose stool without frank diarrhea. This is despite taking Activia & a probiotic. She has some infrapatellar pain with flexion        Objective:   Physical Exam  General appearance:adequate nourishment w/o distress.  Eyes: No conjunctival inflammation or scleral icterus is present.  Oral exam: Dental hygiene is good; lips and gums are healthy appearing.There is no oropharyngeal erythema or exudate noted.   Heart:  Normal rate and regular rhythm. S1 and S2 normal without gallop, murmur, click, rub or other extra sounds     Lungs:Chest clear to auscultation; no wheezes, rhonchi,rales ,or rubs present.No increased work of breathing.   Abdomen: bowel sounds decreased, soft and non-tender without masses, organomegaly or hernias noted.  No guarding or rebound . No tenderness over the flanks to percussio  Musculoskeletal: minor crepitus L knee. No effusion. Using cane Skin:Warm & dry. Clinically no cellulitis. Exfoliation below well healed wound.Skin intact without suspicious lesions or rashes ; no jaundice or tenting  Lymphatic: No lymphadenopathy is noted about the head, neck, axilla.                Assessment & Plan:  #1 antibiotic-induced bowel changes with loose stool. Clinically  Clostridium difficile is not suggested.  #2 residual mechanical pain the left knee following the trauma. No evidence of cellulitis at this time. All cultures negative. Ultrasound did not reveal an abscess or suggestion of subcutaneous air.  Plan: She is to continue the probiotic until the stools are perfectly normal for at least 72 hours. Thankfully tonight will be the last clindamycin dose.

## 2013-09-18 NOTE — Progress Notes (Signed)
Pre visit review using our clinic review tool, if applicable. No additional management support is needed unless otherwise documented below in the visit note. 

## 2013-09-18 NOTE — Patient Instructions (Signed)
Please take a probiotic every day until the bowels are normal for @ least 72 hours. This will replace the normal bacteria which  are necessary for formation of normal stool and processing of food.

## 2013-09-19 ENCOUNTER — Encounter: Payer: Self-pay | Admitting: Internal Medicine

## 2013-10-08 ENCOUNTER — Encounter: Payer: Self-pay | Admitting: Family Medicine

## 2013-10-08 ENCOUNTER — Ambulatory Visit (INDEPENDENT_AMBULATORY_CARE_PROVIDER_SITE_OTHER): Payer: Medicare Other | Admitting: Family Medicine

## 2013-10-08 VITALS — BP 111/73 | HR 85 | Temp 98.0°F | Ht 61.0 in | Wt 250.0 lb

## 2013-10-08 DIAGNOSIS — IMO0001 Reserved for inherently not codable concepts without codable children: Secondary | ICD-10-CM

## 2013-10-08 DIAGNOSIS — L0291 Cutaneous abscess, unspecified: Secondary | ICD-10-CM

## 2013-10-08 DIAGNOSIS — L0390 Cellulitis, unspecified: Secondary | ICD-10-CM

## 2013-10-08 DIAGNOSIS — S8992XS Unspecified injury of left lower leg, sequela: Secondary | ICD-10-CM

## 2013-10-10 ENCOUNTER — Encounter: Payer: Self-pay | Admitting: Family Medicine

## 2013-10-10 NOTE — Progress Notes (Signed)
Patient ID: Nicole Mccann, female   DOB: 06/07/1946, 67 y.o.   MRN: [id number]  PCP: Marga Melnick, MD  Subjective:   HPI: Patient is a 67 y.o. female here for left knee injury.  10/13: Patient [place] the [place] near [place] of steps. There was a landing a few inches lower than last step that she didn't see - fell directly onto her left knee. + swelling, bruising. Difficulty walking - using a cane. Had radiographs on Friday negative for fracture. Has been icing and elevating. No prior knee surgeries, injuries.  11/4: Unfortunately when patient traveled to [place] a little over 2 weeks ago she developed increased swelling, redness and was admitted to hospital after 3 ED visits in different towns. Hospitalized for a total of 10 days - had drainage of infected hematoma and placed on antibiotics. Per records appears she was placed on oral avelox at one point, cipro another point (they have this antibiotic with them). Reviewed all labwork they were given and there is not a wound culture report here. Leg is still red and warm but she has no fevers, redness has not spread. When came back from [place] came straight to ED and was placed on clindamycin.  12/1: Patient returns for left leg follow-up. Reports she feels better than her initial visit with [place]. Completed all her antibiotics. Is resting, elevating. No fevers but still has redness in left lower leg with swelling, tender to the touch. Hard to sit for a long time and to walk - using cane. Massage helps temporarily as does ACE wrap for compression.  Past Medical History  Diagnosis Date  . Hyperlipidemia   . Allergy   . Diabetes mellitus   . Asthma   . Atypical seizure 06/07/2012  . DVT (deep venous thrombosis)   . Olfactory aura   . Shingles 9/14    Current Outpatient Prescriptions on File Prior to Visit  Medication Sig Dispense Refill  . buPROPion (WELLBUTRIN SR) 150 MG 12 hr tablet Take 1 tablet  (150 mg total) by mouth 2 (two) times daily.  180 tablet  1  . clindamycin (CLEOCIN) 150 MG capsule Take 150 mg by mouth 4 (four) times daily. 2 pills every 6 hrs Rxed by ER 09/10/13      . LamoTRIgine (LAMICTAL XR) 100 MG TB24 Take 200 mg by mouth daily.      . Linagliptin-Metformin HCl (JENTADUETO) 2.5-500 MG TABS Take 1 tablet by mouth. TAKE ONLY IF BS ABOVE 150 FASTING      . loratadine (CLARITIN) 10 MG tablet Take 10 mg by mouth daily.        . Multiple Vitamins-Minerals (MULTIVITAMIN & MINERAL PO) Take 1 tablet by mouth daily.       No current facility-administered medications on file prior to visit.    Past Surgical History  Procedure Laterality Date  . Appendectomy    . Tonsillectomy     . Cholecystectomy    . Colonoscopy  2006    [facility]    Allergies  Allergen Reactions  . Mushroom Ext Cmplx(Shiitake-Reishi-Mait) Anaphylaxis  . Shellfish Allergy Anaphylaxis  . Sulfonamide Derivatives Anaphylaxis    unknown  . Acetaminophen     REACTION: STOMACH DISCOMFORT  . Butalbital-Aspirin-Caffeine     unknown  . Clarithromycin Nausea And Vomiting  . Fluticasone-Salmeterol     Feels like something in the throat  . Montelukast Sodium     unknown  . Moxifloxacin  unknown  . Orphenadrine Citrate     unknown  . Penicillins     unknown    History   Social History  . Marital Status: Married    Spouse Name: N/A    Number of Children: N/A  . Years of Education: N/A   Occupational History  . Not on file.   Social History Main Topics  . Smoking status: Never Smoker   . Smokeless tobacco: Never Used  . Alcohol Use: 4.2 oz/week    7 Glasses of wine per week  . Drug Use: No  . Sexual Activity: Not on file   Other Topics Concern  . Not on file   Social History Narrative  . No narrative on file    Family History  Problem Relation Age of Onset  . Stomach cancer Father   . Coronary artery disease Father   . Heart disease Father   . Breast cancer Sister      breast cancer  . Diabetes Maternal Aunt   . Diabetes Paternal Aunt   . Stroke Neg Hx   . Prostate cancer Brother     BP 111/73  Pulse 85  Temp(Src) 98 F (36.7 C) (Oral)  Ht 5\' 1"  (1.549 m)  Wt 250 lb (113.399 kg)  BMI 47.26 kg/m2  Review of Systems: See HPI above.    Objective:  Physical Exam:  Gen: NAD  L knee: 1+ pitting edema lower leg with associated redness, warmth.  No effusion. No other deformity.  No warmth of knee.  No other deformity. TTP throughout lower leg. ROM 0 - 110 degrees Negative ant/post drawers. Negative valgus/varus testing. Negative lachmanns. NV intact distally.    Assessment & Plan:  1. Cellulitis - Patient treated while in [place] and again when she returned to the [place] for this.  Hematoma from fall did not grow anything on wound culture in [place] per outside records.  Here she took clindamycin.  Records indicate she may have been on avelox while there as oral antibiotic.  Is afebrile though has warmth, redness back in lower leg.  WBC count was up to 11K based on bloodwork in [place]. At this point will refer to ID for their input and whether or not additional treatment is needed.  I would like to also start her in PT for deconditioning as this may be a significant part of her swelling, lower extremity pain at this point.  Icing, elevation, tylenol.  Ibuprofen if needed in addition to that.  Compression with ACE wrap if tolerated.

## 2013-10-10 NOTE — Assessment & Plan Note (Signed)
Patient treated while in [place] and again when she returned to the [place] for this.  Hematoma from fall did not grow anything on wound culture in [place] per outside records.  Here she took clindamycin.  Records indicate she may have been on avelox while there as oral antibiotic.  Is afebrile though has warmth, redness back in lower leg.  WBC count was up to 11K based on bloodwork in [place]. At this point will refer to ID for their input and whether or not additional treatment is needed.  I would like to also start her in PT for deconditioning as this may be a significant part of her swelling, lower extremity pain at this point.  Icing, elevation, tylenol.  Ibuprofen if needed in addition to that.  Compression with ACE wrap if tolerated.

## 2013-10-11 ENCOUNTER — Encounter: Payer: Self-pay | Admitting: Internal Medicine

## 2013-10-11 ENCOUNTER — Ambulatory Visit (INDEPENDENT_AMBULATORY_CARE_PROVIDER_SITE_OTHER): Payer: Medicare Other | Admitting: Internal Medicine

## 2013-10-11 VITALS — BP 133/83 | HR 81 | Temp 98.0°F | Wt 260.0 lb

## 2013-10-11 DIAGNOSIS — S8992XS Unspecified injury of left lower leg, sequela: Secondary | ICD-10-CM

## 2013-10-11 DIAGNOSIS — L0291 Cutaneous abscess, unspecified: Secondary | ICD-10-CM

## 2013-10-11 DIAGNOSIS — IMO0001 Reserved for inherently not codable concepts without codable children: Secondary | ICD-10-CM

## 2013-10-11 DIAGNOSIS — L0390 Cellulitis, unspecified: Secondary | ICD-10-CM

## 2013-10-11 NOTE — Progress Notes (Signed)
Subjective:    Patient ID: Nicole Mccann, female    DOB: January 07, 1946, 67 y.o.   MRN: [id number]  HPI Nicole Mccann is a pleasant 67 y.o. female in clinic with her husband as a referral for evaluation of cellulitis. Nicole Mccann sustained left knee injury on 08/16/13 where she sustained Ground level fall onto her knee had swelling and bruising associated with the injury. Had xray that ruled out fx. She did conservative management with icing and elevation. She went On her vacation to [place] but was hospitalized for a total of 10 days in [place] due to worsening swelling and erythema to knee and left lower extremity. She had hematoma drained, and  Imaging to rule out DVT per their report. cultures were negative per patient's report. She was discharged on moxifloxacin for one course as well as ciprofloxacin. She [place] to the states on 11[place]3  And went to the ED for evauation, where she was placed on clindamycin and followed up with PCP, Dr. Alwyn Ren. She continued to finish the 7 day course of clindamycin on 11/11. Followed up with Dr. Alwyn Ren  As well as Dr. Pearletha Forge who continued to monitor her progress. At her visit with Dr. Pearletha Forge on 11/13 she had ultrasound that showed fluid in anterior knee for which attempted to undergo aspiraiton but only 1ml dark blood, nonpurulent fluid collected. She was on clindamycin at the time. The Erythema and swelling of the knee is much improved. No fevers, chills, nightsweats. She does however still accumulate left lower leg swelling, and Certain areas of pain in her calf that improve with massage. She does use ACE wrap for compression. She states that she gets sporadic sharp pain, feels like muscle cramping. Also occ gets pain to the joint itself, but admits not Using her knee as much as in the past. She does keep it elevated when she lies down which helps with the swelling  She reports having a slow amount of time to heal from previous injuries. She mentioned taking long time  for induration of IV site to left forearm.   Allergies  Allergen Reactions  . Mushroom Ext Cmplx(Shiitake-Reishi-Mait) Anaphylaxis  . Shellfish Allergy Anaphylaxis  . Sulfonamide Derivatives Anaphylaxis    unknown  . Acetaminophen     REACTION: STOMACH DISCOMFORT  . Butalbital-Aspirin-Caffeine     unknown  . Clarithromycin Nausea And Vomiting  . Fluticasone-Salmeterol     Feels like something in the throat  . Montelukast Sodium     unknown  . Moxifloxacin     unknown  . Orphenadrine Citrate     unknown  . Penicillins     unknown   Current Outpatient Prescriptions on File Prior to Visit  Medication Sig Dispense Refill  . buPROPion (WELLBUTRIN SR) 150 MG 12 hr tablet Take 1 tablet (150 mg total) by mouth 2 (two) times daily.  180 tablet  1  . LamoTRIgine (LAMICTAL XR) 100 MG TB24 Take 200 mg by mouth daily.      . Linagliptin-Metformin HCl (JENTADUETO) 2.5-500 MG TABS Take 1 tablet by mouth. TAKE ONLY IF BS ABOVE 150 FASTING      . loratadine (CLARITIN) 10 MG tablet Take 10 mg by mouth daily.        . Multiple Vitamins-Minerals (MULTIVITAMIN & MINERAL PO) Take 1 tablet by mouth daily.       No current facility-administered medications on file prior to visit.   Active Ambulatory Problems    Diagnosis Date Noted  . DIABETES MELLITUS, TYPE II  04/04/2009  . HYPERLIPIDEMIA NEC/NOS 06/29/2007  . DISORDER, DYSMETABOLIC SYNDROME X 06/29/2007  . BENIGN POSITIONAL VERTIGO 11/06/2009  . ALLERGIC RHINITIS 04/04/2009  . ASTHMA 04/04/2009  . DIVERTICULOSIS, COLON 04/04/2009  . Phlebitis following infusion 06/05/2012  . Atypical seizure 06/07/2012  . DVT (deep venous thrombosis) 07/07/2012  . Pulmonary embolism 07/09/2012  . Spinal stenosis of lumbar region 12/17/2012  . Variants of migraine, not elsewhere classified, without mention of intractable migraine without mention of status migrainosus 05/22/2013  . 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 Injury of left  knee 08/22/2013   Resolved Ambulatory Problems    Diagnosis Date Noted  . OTALGIA 11/06/2009  . SINUSITIS- ACUTE-NOS 04/16/2010  . ANKLE PAIN, LEFT 04/03/2010  . ACHILLES TENDINITIS 04/03/2010  . CHEST PAIN 02/13/2010  . LACERATION, FACE 05/14/2009  . INJURY, LEG 05/14/2009  . ADVEF, DRUG/MEDICINAL/BIOLOGICAL SUBST NOS 06/29/2007  . ALLERGY 03/23/2007  . Pleuritic chest pain 07/07/2012  . Dyspnea 07/07/2012  . Bug bite with infection 06/29/2013  . Shingles 07/11/2013   Past Medical History  Diagnosis Date  . Hyperlipidemia   . Allergy   . Diabetes mellitus   . Asthma   . Olfactory aura    History  Substance Use Topics  . Smoking status: Never Smoker   . Smokeless tobacco: Never Used  . Alcohol Use: 4.2 oz/week    7 Glasses of wine per week  family history includes Breast cancer in her sister; Coronary artery disease in her father; Diabetes in her maternal aunt and paternal aunt; Heart disease in her father; Prostate cancer in her brother; Stomach cancer in her father. There is no history of Stroke.   Review of Systems Review of Systems  Constitutional: Negative for fever, chills, diaphoresis, activity change, appetite change, fatigue and unexpected weight change.  HENT: Negative for congestion, sore throat, rhinorrhea, sneezing, trouble swallowing and sinus pressure.  Eyes: Negative for photophobia and visual disturbance.  Respiratory: Negative for cough, chest tightness, shortness of breath, wheezing and stridor.  Cardiovascular: Negative for chest pain, palpitations and leg swelling.  Gastrointestinal: Negative for nausea, vomiting, abdominal pain, diarrhea, constipation, blood in stool, abdominal distention and anal bleeding.  Genitourinary: Negative for dysuria, hematuria, flank pain and difficulty urinating.  Musculoskeletal: per hpi Skin: per hpi Neurological: Negative for dizziness, tremors, weakness and light-headedness.  Hematological: Negative for adenopathy.  Does not bruise/bleed easily.  Psychiatric/Behavioral: Negative for behavioral problems, confusion, sleep disturbance, dysphoric mood, decreased concentration and agitation.        Objective:   Physical Exam BP 133/83  Pulse 81  Temp(Src) 98 F (36.7 C) (Oral)  Wt 260 lb (117.935 kg) Physical Exam  Constitutional: oriented to person, place, and time. appears well-developed and well-nourished. No distress.  HENT:  Mouth/Throat: Oropharynx is clear and moist. No oropharyngeal exudate.  Cardiovascular: Normal rate, regular rhythm and normal heart sounds. Exam reveals no gallop and no friction rub.  No murmur heard.  Pulmonary/Chest: Effort normal and breath sounds normal. No respiratory distress. no wheezes.  Lymphadenopathy:  no cervical adenopathy.  Neurological:  alert and oriented to person, place, and time. 5/5 strength in all extremities. Left knee crepitus with range of motion Skin: Skin is warm and dry. Mild non-blanching erythema to distal left lower extremity not associated with rash nor induration. Right leg hyperpigmentation  Ext: trace-+1 pitting edema of left lower extremity.> right leg. No swelling of left knee Psychiatric:  a normal mood and affect.  behavior is normal.  Assessment & Plan:  Left leg cellulitis = appears resolved. She appears to have improvement from the courses of antibiotics she received. She states that her leg is not necessarily any different to when  She finished her antibiotics 2-3 wks. Physical exam is more consistent with lower extremity edema but not necessarily cellulitis presently. Recommend to continue with her current Conservative management with wrapping her leg, ice if knee becomes swollen, can use icy-hot/salon pas heat pad for muscle cramping sensation to see if that helps her symptoms.  Agree with Dr. Pearletha Forge that she would benefit from physical therapy. Will not give antibiotics for now.  If she has worsening redness, fever, chills  to come back to clinic  rtc prn  Cc: [facility]

## 2013-10-25 ENCOUNTER — Encounter: Payer: Self-pay | Admitting: Family Medicine

## 2013-10-25 ENCOUNTER — Ambulatory Visit (INDEPENDENT_AMBULATORY_CARE_PROVIDER_SITE_OTHER): Payer: Medicare Other | Admitting: Family Medicine

## 2013-10-25 VITALS — BP 127/82 | HR 80 | Ht 61.0 in | Wt 250.0 lb

## 2013-10-25 DIAGNOSIS — M79609 Pain in unspecified limb: Secondary | ICD-10-CM

## 2013-10-25 DIAGNOSIS — S8992XS Unspecified injury of left lower leg, sequela: Secondary | ICD-10-CM

## 2013-10-25 DIAGNOSIS — IMO0001 Reserved for inherently not codable concepts without codable children: Secondary | ICD-10-CM

## 2013-10-25 DIAGNOSIS — M79605 Pain in left leg: Secondary | ICD-10-CM

## 2013-10-25 NOTE — Patient Instructions (Signed)
Start physical therapy for left knee pain and deconditioning. Compression wrap or stockings to help with swelling. Elevate above the level of your heart as much as possible. Icing tends to help with swelling and pain. Heat helps with muscle spasms and pain. Can consider lasix (a fluid pill) but this is not a preferred way to get the fluid out of your leg (requires ensuring your potassium and kidney function are normal and stable). Follow up with me in 6 weeks for reevaluation.

## 2013-10-26 ENCOUNTER — Ambulatory Visit: Payer: Medicare Other | Attending: Family Medicine | Admitting: Physical Therapy

## 2013-10-26 DIAGNOSIS — M25669 Stiffness of unspecified knee, not elsewhere classified: Secondary | ICD-10-CM | POA: Insufficient documentation

## 2013-10-26 DIAGNOSIS — R609 Edema, unspecified: Secondary | ICD-10-CM | POA: Insufficient documentation

## 2013-10-26 DIAGNOSIS — R262 Difficulty in walking, not elsewhere classified: Secondary | ICD-10-CM | POA: Insufficient documentation

## 2013-10-26 DIAGNOSIS — M25569 Pain in unspecified knee: Secondary | ICD-10-CM | POA: Insufficient documentation

## 2013-10-26 DIAGNOSIS — IMO0001 Reserved for inherently not codable concepts without codable children: Secondary | ICD-10-CM | POA: Insufficient documentation

## 2013-10-26 DIAGNOSIS — J45909 Unspecified asthma, uncomplicated: Secondary | ICD-10-CM | POA: Insufficient documentation

## 2013-10-28 ENCOUNTER — Encounter: Payer: Self-pay | Admitting: Family Medicine

## 2013-10-28 NOTE — Progress Notes (Signed)
Patient ID: Nicole Mccann, female   DOB: Mar 31, 1946, 67 y.o.   MRN: [id number]  PCP: Marga Melnick, MD  Subjective:   HPI: Patient is a 67 y.o. female here for left knee injury.  10/13: Patient reports [place] the beach on [place] near bottom of steps. There was a landing a few inches lower than last step that she didn't see - fell directly onto her left knee. + swelling, bruising. Difficulty walking - using a cane. Had radiographs on Friday negative for fracture. Has been icing and elevating. No prior knee surgeries, injuries.  11/4: Unfortunately when patient traveled to [place] a little over 2 weeks ago she developed increased swelling, redness and was admitted to hospital after 3 ED visits in different towns. Hospitalized for a total of 10 days - had drainage of infected hematoma and placed on antibiotics. Per records appears she was placed on oral avelox at one point, cipro another point (they have this antibiotic with them). Reviewed all labwork they were given and there is not a wound culture report here. Leg is still red and warm but she has no fevers, redness has not spread. When came back from [place] came straight to ED and was placed on clindamycin.  12/1: Patient returns for left leg follow-up. Reports she feels better than her initial visit with [place]. Completed all her antibiotics. Is resting, elevating. No fevers but still has redness in left lower leg with swelling, tender to the touch. Hard to sit for a long time and to walk - using cane. Massage helps temporarily as does ACE wrap for compression.  12/18: Patient returns today briefly as she would like to start physical therapy. Had some questions about the fluid in her leg, how to resolve this too - answered these.  Past Medical History  Diagnosis Date  . Hyperlipidemia   . Allergy   . Diabetes mellitus   . Asthma   . Atypical seizure 06/07/2012  . DVT (deep venous thrombosis)   . Olfactory aura   .  Shingles 9/14    Current Outpatient Prescriptions on File Prior to Visit  Medication Sig Dispense Refill  . buPROPion (WELLBUTRIN SR) 150 MG 12 hr tablet Take 1 tablet (150 mg total) by mouth 2 (two) times daily.  180 tablet  1  . LamoTRIgine (LAMICTAL XR) 100 MG TB24 Take 200 mg by mouth daily.      . Linagliptin-Metformin HCl (JENTADUETO) 2.5-500 MG TABS Take 1 tablet by mouth. TAKE ONLY IF BS ABOVE 150 FASTING      . loratadine (CLARITIN) 10 MG tablet Take 10 mg by mouth daily.        . Multiple Vitamins-Minerals (MULTIVITAMIN & MINERAL PO) Take 1 tablet by mouth daily.       No current facility-administered medications on file prior to visit.    Past Surgical History  Procedure Laterality Date  . Appendectomy    . Tonsillectomy     . Cholecystectomy    . Colonoscopy  2006    [facility]    Allergies  Allergen Reactions  . Mushroom Ext Cmplx(Shiitake-Reishi-Mait) Anaphylaxis  . Shellfish Allergy Anaphylaxis  . Sulfonamide Derivatives Anaphylaxis    unknown  . Acetaminophen     REACTION: STOMACH DISCOMFORT  . Butalbital-Aspirin-Caffeine     unknown  . Clarithromycin Nausea And Vomiting  . Fluticasone-Salmeterol     Feels like something in the throat  . Montelukast Sodium     unknown  . Moxifloxacin  unknown  . Orphenadrine Citrate     unknown  . Penicillins     unknown    History   Social History  . Marital Status: Married    Spouse Name: N/A    Number of Children: N/A  . Years of Education: N/A   Occupational History  . Not on file.   Social History Main Topics  . Smoking status: Never Smoker   . Smokeless tobacco: Never Used  . Alcohol Use: 4.2 oz/week    7 Glasses of wine per week  . Drug Use: No  . Sexual Activity: Not on file   Other Topics Concern  . Not on file   Social History Narrative  . No narrative on file    Family History  Problem Relation Age of Onset  . Stomach cancer Father   . Coronary artery disease Father   . Heart  disease Father   . Breast cancer Sister     breast cancer  . Diabetes Maternal Aunt   . Diabetes Paternal Aunt   . Stroke Neg Hx   . Prostate cancer Brother     BP 127/82  Pulse 80  Ht 5\' 1"  (1.549 m)  Wt 250 lb (113.399 kg)  BMI 47.26 kg/m2  Review of Systems: See HPI above.    Objective:  Physical Exam:  Gen: NAD  L knee: 1+ pitting edema lower leg with mild redness, minimal warmth.  No effusion. No other deformity.  No warmth of knee.  No other deformity. TTP throughout lower leg. NV intact distally.    Assessment & Plan:  1. Cellulitis - s/p appropriate treatment.  Appreciate Infectious disease input as well.  Will go ahead with physical therapy.  Discussed compression, elevation, icing to help with swelling, heat for spasms.  Consider diuretic treatment as last resort but advised against this at this time - would need to follow creatinine, potassium.  F/u in about 6 weeks.

## 2013-10-29 NOTE — Assessment & Plan Note (Signed)
s/p appropriate treatment for cellulitis.  Appreciate Infectious disease input as well.  Will go ahead with physical therapy.  Discussed compression, elevation, icing to help with swelling, heat for spasms.  Consider diuretic treatment as last resort but advised against this at this time - would need to follow creatinine, potassium.  F/u in about 6 weeks.

## 2013-10-30 ENCOUNTER — Ambulatory Visit: Payer: Medicare Other | Admitting: Physical Therapy

## 2013-11-05 ENCOUNTER — Encounter: Payer: Self-pay | Admitting: *Deleted

## 2013-11-06 ENCOUNTER — Ambulatory Visit: Payer: Medicare Other | Admitting: Physical Therapy

## 2013-11-12 ENCOUNTER — Ambulatory Visit: Payer: Medicare Other | Attending: Family Medicine | Admitting: Physical Therapy

## 2013-11-12 DIAGNOSIS — R262 Difficulty in walking, not elsewhere classified: Secondary | ICD-10-CM | POA: Insufficient documentation

## 2013-11-12 DIAGNOSIS — M25669 Stiffness of unspecified knee, not elsewhere classified: Secondary | ICD-10-CM | POA: Insufficient documentation

## 2013-11-12 DIAGNOSIS — IMO0001 Reserved for inherently not codable concepts without codable children: Secondary | ICD-10-CM | POA: Insufficient documentation

## 2013-11-12 DIAGNOSIS — M25569 Pain in unspecified knee: Secondary | ICD-10-CM | POA: Insufficient documentation

## 2013-11-12 DIAGNOSIS — R609 Edema, unspecified: Secondary | ICD-10-CM | POA: Insufficient documentation

## 2013-11-14 ENCOUNTER — Ambulatory Visit: Payer: Medicare Other | Admitting: Rehabilitation

## 2013-11-15 ENCOUNTER — Telehealth: Payer: Self-pay | Admitting: Neurology

## 2013-11-15 ENCOUNTER — Encounter: Payer: Medicare Other | Admitting: Rehabilitation

## 2013-11-15 NOTE — Telephone Encounter (Signed)
Husband is requesting assistance(pshchiatric  because wife has been having a lot of health issues,falling down stairs, hemotoma, infections, and this has caused some emotional problems, is nervous, needs some help to calm her down.

## 2013-11-15 NOTE — Telephone Encounter (Signed)
Wants some assistance with getting a referral for his wife for psychiatric assistance

## 2013-11-19 ENCOUNTER — Ambulatory Visit: Payer: Medicare Other | Admitting: Physical Therapy

## 2013-11-19 NOTE — Telephone Encounter (Signed)
agree

## 2013-11-19 NOTE — Telephone Encounter (Signed)
Pt and husband came by and asking about message below.  I called and spoke to him about pt and his concern of needing a mental health person to assist pt with anxiety, depression.  I told him that since she has not seen anyone about this that I would start with pcp, Dr. Alwyn Ren for initial eval.   He would be aware of current psychiatrists that may be able to help.  Dr. Vickey Huger has used Dr. Archer Asa, and Dr. Andee Poles.  He has list of providers he has gotten from his insurance.  [facility] also.  He would see Dr. Alwyn Ren first and then go from there.  Has appt with Dr. Doristine Locks in 05/2014 in [place].

## 2013-11-22 ENCOUNTER — Ambulatory Visit: Payer: Medicare Other | Admitting: Rehabilitation

## 2013-11-26 ENCOUNTER — Ambulatory Visit: Payer: Medicare Other | Admitting: Physical Therapy

## 2013-11-29 ENCOUNTER — Ambulatory Visit: Payer: Medicare Other | Admitting: Physical Therapy

## 2013-12-03 ENCOUNTER — Ambulatory Visit: Payer: Medicare Other | Admitting: Physical Therapy

## 2013-12-04 ENCOUNTER — Encounter: Payer: Self-pay | Admitting: Internal Medicine

## 2013-12-04 ENCOUNTER — Ambulatory Visit (INDEPENDENT_AMBULATORY_CARE_PROVIDER_SITE_OTHER): Payer: Medicare Other | Admitting: Internal Medicine

## 2013-12-04 VITALS — BP 122/74 | HR 82 | Temp 98.3°F | Resp 18 | Wt 236.0 lb

## 2013-12-04 DIAGNOSIS — F329 Major depressive disorder, single episode, unspecified: Secondary | ICD-10-CM

## 2013-12-04 DIAGNOSIS — F32A Depression, unspecified: Secondary | ICD-10-CM

## 2013-12-04 DIAGNOSIS — F3289 Other specified depressive episodes: Secondary | ICD-10-CM

## 2013-12-04 NOTE — Patient Instructions (Signed)
I recommend a Psychiatry consultation to determine optimal therapy.

## 2013-12-04 NOTE — Progress Notes (Signed)
Pre-visit discussion using our clinic review tool, as applicable. No additional management support is needed unless otherwise documented below in the visit note.  

## 2013-12-04 NOTE — Progress Notes (Signed)
   Subjective:    Patient ID: Nicole Mccann, female    DOB: [birth year], 68 y.o.   MRN: [id number]  HPI Symptoms began as  depression manifested as moodiness & feeling "like I am in  a hole" in the context of health stresses in past 2 years. Two sibs have cancer; they live in [place].The symptoms have not impacted sleep appetite. Significant fatigue is described without associated change in weight . Treatment to date  with generic Wellbutrin has been only partially effective   Specifically there has been no constellation of headache, chest pain, flushing, and diarrhea   Family history  positive for  depression in her mother & daughter.   Dr Chestine Spore has checked TSH   Review of Systems No fever  chills sweats  No chest pain  palpitations , racing or  irregular heart rhythm . No constipation or diarrhea  .  No associated weakness , tremor,or  gait issues.  Some  memory loss  . Blurred vision, double vision, vision  are not present. Heat intolerance. Significant change in skin or  hair  have not been documented. Nails brittle        Objective:   Physical Exam  Gen.: Adequately nourished in appearance. Weigh excess. Marland KitchenAppropriate and cooperative throughout exam. Appears younger than stated age  Head: Normocephalic without obvious abnormalities Eyes: No corneal or conjunctival inflammation noted. Pupils equal round reactive to light and accommodation. Extraocular motion intact. No lid lag or proptosis  Neck: No deformities, masses, or tenderness noted. Range of motion good.Thyroid normal. Lungs: Normal respiratory effort; chest expands symmetrically. Lungs are clear to auscultation without rales, wheezes, or increased work of breathing. Heart: Normal rate and rhythm. Normal S1 and S2. No gallop, click, or rub. S4 w/o murmur.                                  Musculoskeletal/extremities: Accentuated curvature of upper thoracic spine.  No clubbing, cyanosis, edema, or significant  extremity  deformity noted. Tone & strength normal. Hand joints normal . Fingernail health good. Fusiform L knee ? With effusion . Neurologic: Alert and oriented x3.Flat affect. Deep tendon reflexes symmetrical and normal.         Skin: Intact without suspicious lesions or rashes. Lymph: No cervical, axillary lymphadenopathy present. Psych: Mood and affect are normal. Normally interactive                                                                                        Assessment & Plan:  #1 depression See orders

## 2013-12-06 ENCOUNTER — Ambulatory Visit (INDEPENDENT_AMBULATORY_CARE_PROVIDER_SITE_OTHER): Payer: Medicare Other | Admitting: Family Medicine

## 2013-12-06 VITALS — BP 143/79 | HR 82 | Ht 61.0 in | Wt 253.0 lb

## 2013-12-06 DIAGNOSIS — S8990XA Unspecified injury of unspecified lower leg, initial encounter: Secondary | ICD-10-CM

## 2013-12-06 DIAGNOSIS — S8992XA Unspecified injury of left lower leg, initial encounter: Secondary | ICD-10-CM

## 2013-12-06 DIAGNOSIS — S99929A Unspecified injury of unspecified foot, initial encounter: Secondary | ICD-10-CM

## 2013-12-06 DIAGNOSIS — S99919A Unspecified injury of unspecified ankle, initial encounter: Secondary | ICD-10-CM

## 2013-12-06 DIAGNOSIS — M25569 Pain in unspecified knee: Secondary | ICD-10-CM

## 2013-12-06 DIAGNOSIS — M25562 Pain in left knee: Secondary | ICD-10-CM

## 2013-12-06 NOTE — Patient Instructions (Addendum)
Stop vitamin C if you're taking this.  General arthritis instructions: Take tylenol 500mg  1-2 tabs three times a day for pain. Aleve 1-2 tabs twice a day with food Capsaicin topically up to four times a day may also help with pain. Cortisone injections are an option. If cortisone injections do not help, there are different types of shots that may help but they take longer to take effect. It's important that you continue to stay active. If you are overweight, try to lose weight through diet and exercise. Straight leg raises, knee extensions 3 sets of 10 once a day (add ankle weight if these become too easy). Consider physical therapy to strengthen muscles around the joint that hurts to take pressure off of the joint itself. Shoe inserts with good arch support may be helpful. Walker or cane if needed. Heat or ice 15 minutes at a time 3-4 times a day as needed to help with pain. Water aerobics and cycling with low resistance are the best two types of exercise for arthritis.

## 2013-12-11 ENCOUNTER — Ambulatory Visit (HOSPITAL_BASED_OUTPATIENT_CLINIC_OR_DEPARTMENT_OTHER): Payer: Medicare Other

## 2013-12-11 ENCOUNTER — Ambulatory Visit (HOSPITAL_BASED_OUTPATIENT_CLINIC_OR_DEPARTMENT_OTHER)
Admission: RE | Admit: 2013-12-11 | Discharge: 2013-12-11 | Disposition: A | Discharge status: Home / Self Care | Payer: Medicare Other | Source: Ambulatory Visit | Attending: Family Medicine | Admitting: Family Medicine

## 2013-12-11 ENCOUNTER — Encounter: Payer: Self-pay | Admitting: Family Medicine

## 2013-12-11 DIAGNOSIS — M25562 Pain in left knee: Secondary | ICD-10-CM

## 2013-12-11 DIAGNOSIS — X58XXXA Exposure to other specified factors, initial encounter: Secondary | ICD-10-CM | POA: Insufficient documentation

## 2013-12-11 DIAGNOSIS — S83289A Other tear of lateral meniscus, current injury, unspecified knee, initial encounter: Secondary | ICD-10-CM | POA: Insufficient documentation

## 2013-12-11 DIAGNOSIS — M23305 Other meniscus derangements, unspecified medial meniscus, unspecified knee: Secondary | ICD-10-CM | POA: Insufficient documentation

## 2013-12-11 DIAGNOSIS — M238X9 Other internal derangements of unspecified knee: Secondary | ICD-10-CM | POA: Insufficient documentation

## 2013-12-11 DIAGNOSIS — M25569 Pain in unspecified knee: Secondary | ICD-10-CM | POA: Insufficient documentation

## 2013-12-11 DIAGNOSIS — M25469 Effusion, unspecified knee: Secondary | ICD-10-CM | POA: Insufficient documentation

## 2013-12-11 NOTE — Assessment & Plan Note (Signed)
started with direct blow to this, secondary cellulitis that she has had complete treatment for.  Finished physical therapy and still dealing with pain and now clicking with sharp pain within left knee.  Discussed options at this point - considering MRI to further assess.  RSD is a consideration but usually doesn't have waxing and waning phases to it.  Could consider cortisone injection, neurontin, nortriptyline if MRI negative.  Start taking vitamin C 500mg  daily.

## 2013-12-11 NOTE — Progress Notes (Addendum)
Patient ID: Nicole Mccann, female   DOB: [birth year], 68 y.o.   MRN: [id number]  PCP: Marga MelnickWilliam Hopper, MD  Subjective:   HPI: Patient is a 68 y.o. female here for left knee injury.  10/13: Patient reports she was [place] the beach [place] near bottom of steps. There was a landing a few inches lower than last step that she didn't see - fell directly onto her left knee. + swelling, bruising. Difficulty walking - using a cane. Had radiographs on Friday negative for fracture. Has been icing and elevating. No prior knee surgeries, injuries.  11/4: Unfortunately when [place] ago she developed increased swelling, redness and was admitted to hospital after 3 ED visits in different towns. Hospitalized for a total of 10 days - had drainage of infected hematoma and placed on antibiotics. Per records appears she was placed on oral avelox at one point, cipro another point (they have this antibiotic with them). Reviewed all labwork they were given and there is not a wound culture report here. Leg is still red and warm but she has no fevers, redness has not spread. When [place] straight to ED and was placed on clindamycin.  12/1: Patient returns for left leg follow-up. Reports she feels better than her initial visit with us. Completed all her antibiotics. Is resting, elevating. No fevers but still has redness in left lower leg with swelling, tender to the touch. Hard to sit for a long time and to walk - using cane. Massage helps temporarily as does ACE wrap for compression.  12/18: Patient returns today briefly as she would like to start physical therapy. Had some questions about the fluid in her leg, how to resolve this too - answered these.  1/29: Patient finished with physical therapy - did well but still dealing with pain in left knee and lower leg. Some clicking now within the knee that is painful.  Describes pain as sharp and  anterior. Still with swelling and redness that is intermittent, worse by end of day. Elevating. No fevers.   Past Medical History  Diagnosis Date  . Hyperlipidemia   . Allergy   . Diabetes mellitus   . Asthma   . Atypical seizure 06/07/2012  . DVT (deep venous thrombosis)   . Olfactory aura   . Shingles 9/14    Current Outpatient Prescriptions on File Prior to Visit  Medication Sig Dispense Refill  . buPROPion (WELLBUTRIN SR) 150 MG 12 hr tablet Take 1 tablet (150 mg total) by mouth 2 (two) times daily.  180 tablet  1  . LamoTRIgine (LAMICTAL XR) 100 MG TB24 Take 200 mg by mouth daily.      . Linagliptin-Metformin HCl (JENTADUETO) 2.5-500 MG TABS Take 1 tablet by mouth. TAKE ONLY IF BS ABOVE 150 FASTING      . loratadine (CLARITIN) 10 MG tablet Take 10 mg by mouth daily.        . Multiple Vitamins-Minerals (MULTIVITAMIN & MINERAL PO) Take 1 tablet by mouth daily.       No current facility-administered medications on file prior to visit.    Past Surgical History  Procedure Laterality Date  . Appendectomy    . Tonsillectomy     . Cholecystectomy    . Colonoscopy  2006    [facility]    Allergies  Allergen Reactions  . Mushroom Ext Cmplx(Shiitake-Reishi-Mait) Anaphylaxis  . Shellfish Allergy Anaphylaxis  . Sulfonamide Derivatives Anaphylaxis  unknown  . Acetaminophen     REACTION: STOMACH DISCOMFORT  . Butalbital-Aspirin-Caffeine     unknown  . Clarithromycin Nausea And Vomiting  . Fluticasone-Salmeterol     Feels like something in the throat  . Montelukast Sodium     unknown  . Moxifloxacin     unknown  . Orphenadrine Citrate     unknown  . Penicillins     unknown    History   Social History  . Marital Status: Married    Spouse Name: N/A    Number of Children: N/A  . Years of Education: N/A   Occupational History  . Not on file.   Social History Main Topics  . Smoking status: Never Smoker   . Smokeless tobacco: Never Used  . Alcohol Use: 4.2  oz/week    7 Glasses of wine per week  . Drug Use: No  . Sexual Activity: Not on file   Other Topics Concern  . Not on file   Social History Narrative  . No narrative on file    Family History  Problem Relation Age of Onset  . Stomach cancer Father   . Coronary artery disease Father   . Heart disease Father   . Breast cancer Sister     breast cancer  . Diabetes Maternal Aunt   . Diabetes Paternal Aunt   . Stroke Neg Hx   . Prostate cancer Brother     BP 143/79  Pulse 82  Ht 5\' 1"  (1.549 m)  Wt 253 lb (114.76 kg)  BMI 47.83 kg/m2  Review of Systems: See HPI above.    Objective:  Physical Exam:  Gen: NAD  L knee: 1+ pitting edema lower leg with mild redness, minimal warmth.  No effusion. No other deformity.  No warmth of knee joint.  No other deformity. TTP throughout knee, lower leg. ROM 0 - 90 degrees. Pain with apleys. Collateral ligaments intact.  Negative ant/post drawers. Negative lachmanns. NV intact distally.    Assessment & Plan:  1. Left knee pain - started with direct blow to this, secondary cellulitis that she has had complete treatment for.  Finished physical therapy and still dealing with pain and now clicking with sharp pain within left knee.  Discussed options at this point - considering MRI to further assess.  RSD is a consideration but usually doesn't have waxing and waning phases to it.  Could consider cortisone injection, neurontin, nortriptyline if MRI negative.  Start taking vitamin C 500mg  daily.  Addendum:  MRI reviewed with patient and husband in the office.  As suspected most of findings are degenerative.  Does not have patchy marrow infiltrates you normally see with RSD which is reassuring.  Small lateral meniscus tear may have been caused by the accident but advised them I would not pursue operative intervention for this.  If worsening advised would try intraarticular injection.  Otherwise given general arthritis instructions today,  encouraged to do water aerobics, swimming, recumbent bike, home exercises.

## 2013-12-13 ENCOUNTER — Ambulatory Visit: Payer: Medicare Other | Admitting: Family Medicine

## 2013-12-21 ENCOUNTER — Encounter: Payer: Self-pay | Admitting: *Deleted

## 2014-03-07 ENCOUNTER — Encounter: Payer: Self-pay | Admitting: Internal Medicine

## 2014-03-07 ENCOUNTER — Ambulatory Visit (INDEPENDENT_AMBULATORY_CARE_PROVIDER_SITE_OTHER): Payer: Medicare Other | Admitting: Internal Medicine

## 2014-03-07 VITALS — BP 110/72 | HR 77 | Temp 98.5°F | Resp 14 | Ht 61.0 in | Wt 258.8 lb

## 2014-03-07 DIAGNOSIS — Z0000 Encounter for general adult medical examination without abnormal findings: Secondary | ICD-10-CM

## 2014-03-07 DIAGNOSIS — E119 Type 2 diabetes mellitus without complications: Secondary | ICD-10-CM

## 2014-03-07 DIAGNOSIS — E785 Hyperlipidemia, unspecified: Secondary | ICD-10-CM

## 2014-03-07 NOTE — Progress Notes (Signed)
Subjective:    Patient ID: Nicole Mccann, female    DOB: 05-22-1946, 68 y.o.   MRN: [id number]  HPI Medicare Wellness Visit: Psychosocial and medical history were reviewed as required by Medicare (history related to abuse, antisocial behavior , firearm risk). Social history: Caffeine: 1-2 cups / day , Alcohol:3.5 glasses / week  , Tobacco use: never Exercise:no Personal safety/fall risk:no Limitations of activities of daily living:no Seatbelt/ smoke alarm use:yes Healthcare Power of Attorney/Living Will status: not UTD Ophthalmologic exam status:UTD Hearing evaluation status: not UTD Orientation: Oriented X 3 Memory and recall: good Math testing: good Depression/anxiety assessment: dramatically improved with meds, seeing Dr Albertine Patricia & Therapist Foreign travel history: 10/15 [place] Immunization status for influenza/pneumonia/ shingles /tetanus: UTD Transfusion history:no Preventive health care maintenance status: Colonoscopy/BMD/mammogram/Pap as per protocol/standard care:colonoscopy not UTD,SOC reviewed. Gyn overdue Dental care:overdue Chart reviewed and updated. Active issues reviewed and addressed as documented below.  A modified heart healthy diet is followed.Cholesterol testing monitored by Dr Carlis Abbott To date no statin.  Low dose ASA not taken   Review of Systems Specifically denied are  chest pain, palpitations, dyspnea, or claudication.  Hard stools responsive to Activia. She denies unexplained weight loss, abdominal pain, significant dyspepsia, dysphagia, melena, or rectal bleeding. See note re colonoscopy     Objective:   Physical Exam  Gen.:  well-nourished in appearance.  Weight excess.Alert, appropriate and cooperative throughout exam. Appears younger than stated age  Head: Normocephalic without obvious abnormalities  Eyes: No corneal or conjunctival inflammation noted. Pupils equal round reactive to light and accommodation. Extraocular motion intact. Ears:  External  ear exam reveals no significant lesions or deformities. Canals clear .TMs normal. Hearing is grossly normal bilaterally. Nose: External nasal exam reveals no deformity or inflammation. Nasal mucosa are pink and moist. No lesions or exudates noted.   Mouth: Oral mucosa and oropharynx reveal no lesions or exudates. Teeth in good repair.Upper partial Neck: No deformities, masses, or tenderness noted. Range of motion & Thyroid normal. Lungs: Normal respiratory effort; chest expands symmetrically. Lungs are clear to auscultation without rales, wheezes, or increased work of breathing. Heart: Normal rate and rhythm. Normal S1 and S2. No gallop, click, or rub. No murmur. Abdomen: Protuberant. Bowel sounds normal; abdomen soft and nontender. No masses, organomegaly or hernias noted. Genitalia: as per Gyn                                  Musculoskeletal/extremities: No deformity or scoliosis noted of  the thoracic or lumbar spine.  No clubbing, cyanosis, edema, or significant extremity  deformity noted. Tone & strength normal. Hand joints normal.Fusiform knees, L > R.Using cane Fingernail  health good. Vascular: Carotid, radial artery, dorsalis pedis and  posterior tibial pulses are full and equal. No bruits present. Neurologic: Alert and oriented x3. Deep tendon reflexes symmetrical and 1/2+ Skin: Intact without suspicious lesions or rashes. Lymph: No cervical, axillary lymphadenopathy present. Psych: Mood and affect are normal. Normally interactive  Assessment & Plan:  #1 Medicare Wellness Exam; criteria met ; data entered #2 Problem List/Diagnoses reviewed. Chart updated Plan:  The pathophysiology of neurotransmitter deficiency was discussed along with the benefits and potential adverse effects of continuing SSRI therapy. SOC reviewed concerning colonoscopy. Gyn F/U recommended.

## 2014-03-07 NOTE — Patient Instructions (Addendum)
Please thank Dr Chestine Spore for sharing lab results so these can be entered into My Chart. Cardiovascular exercise, this can be as simple a program as water aerobics, is recommended 30-45 minutes 3-4 times per week. If you're not exercising you should take 6-8 weeks to build up to this level.  . As per the Standard of Care , screening Colonoscopy recommended @ 50 & every 5-10 years thereafter . More frequent monitor would be dictated by family history or findings @ Colonoscopy

## 2014-03-07 NOTE — Progress Notes (Signed)
Pre visit review using our clinic review tool, if applicable. No additional management support is needed unless otherwise documented below in the visit note. 

## 2014-03-18 ENCOUNTER — Telehealth: Payer: Self-pay

## 2014-03-18 NOTE — Telephone Encounter (Signed)
Relevant patient education assigned to patient using Emmi. ° °

## 2014-05-15 ENCOUNTER — Telehealth: Payer: Self-pay | Admitting: *Deleted

## 2014-05-15 DIAGNOSIS — E119 Type 2 diabetes mellitus without complications: Secondary | ICD-10-CM

## 2014-05-15 NOTE — Telephone Encounter (Signed)
Left message on machine for patient to come by the lab to have her cholesterol checked. Lipid ordered Message sent to mychart Diabetic bundle

## 2014-05-16 NOTE — Telephone Encounter (Signed)
Pt husband call stating they received call about some labs. Called pt back no answer LMOM yes md is wanting wife to have her cholesterol check order has been place...Raechel Chute

## 2014-05-16 NOTE — Telephone Encounter (Signed)
Patient had outside labs performed on 03/27/14 and these are scanned into her chart.

## 2014-05-22 ENCOUNTER — Encounter: Payer: Self-pay | Admitting: Neurology

## 2014-05-22 ENCOUNTER — Ambulatory Visit (INDEPENDENT_AMBULATORY_CARE_PROVIDER_SITE_OTHER): Payer: Medicare Other | Admitting: Neurology

## 2014-05-22 VITALS — BP 134/67 | HR 75 | Resp 16 | Wt 248.0 lb

## 2014-05-22 DIAGNOSIS — R443 Hallucinations, unspecified: Secondary | ICD-10-CM

## 2014-05-22 DIAGNOSIS — R442 Other hallucinations: Secondary | ICD-10-CM

## 2014-05-22 DIAGNOSIS — R569 Unspecified convulsions: Secondary | ICD-10-CM

## 2014-05-22 DIAGNOSIS — G4730 Sleep apnea, unspecified: Secondary | ICD-10-CM

## 2014-05-22 DIAGNOSIS — G4710 Hypersomnia, unspecified: Secondary | ICD-10-CM

## 2014-05-22 HISTORY — DX: Hypersomnia, unspecified: G47.10

## 2014-05-22 NOTE — Progress Notes (Signed)
HPI: Ms Lockmiller, 68 -year-old pleasant [place] lady returns for followup. She was last seen by Dr. Vickey Huger 11/21/2012. She initially presented for evaluation of dizziness, vertigo with a spinning sensation, feeling cold,  all this beginning with an olfactorial sensation, and ending with a head pain behind the ears, then feeling hot.  These spells last less than ten minutes and come on up to five a day, starting with one or two times daily.  She was so dizzy she felt she would pass out and vomited.  Her glucose was low at 84  mg/dl and BP was  251/89 mmHg,  pulse was 80.   The vertigo is increasing and she feels more HA and nausea. She stated that some bright colors will hurt her eyes, orange and green. She tried to overpower the olfactory sensation by using perfume.  06-28-12  Patient was seen in the EMU at [facility] and diagnosed with an olfactory aura, no EEG abnormalities. 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  The Keppra was well tolerated. She has only once a week any aura, not daily.  November 21, 2012.  Patient developed depression, possibly worseing on Keppra which we weaned off. She is no longer taking Coumadin for the presumed upper extremity DVT (Dr. Azucena Kuba, [facility])  perhaps rather a thrombophlebitis. She was changed to Lamictal and was doing well on 100 mg XR daily in the AM  after a slow titration.  She will progress to 100 mg now.    05/21/13- followup visit today and patient has had 2 auras  since last seen . Her Lamictal dose is currently at 150 daily instead of 200 as Dr. Vickey Huger had ordered. Apparently she did not understand that she was supposed to continue to titrate to 200. She denies any side effects to the medication. He has no new neurologic complaints. She is currently receiving lumbar epidural steroids by Dr. Arta Bruce for her low back pain  05-22-14 - psychaitrist  recommend Wellbutrin, but changed to Zoloft after seizure concerns were discussed. She has better mood on Lamictal, was sad on Keppra.  She was hospitalized in [place] , August 28 2013, after a seizure or syncope on a cruise. Her husband fainted  and needed hospital admission , too.  Both Z[place] spend their [employer] in a hospital in [place]. Her husband is concerned about her loud snoring, she is obese and has been very fatigued. She denies waking up with headaches. Both have witnessed each others apneas.     ROS:  Weight gain, easy bruising, allergies, snoring  Physical Exam General: well developed, well nourished, seated, in no evident distress Head: head normocephalic and atraumatic. Oropharynx benign Neck: supple with no carotid  bruits Cardiovascular: regular rate and rhythm, no murmurs  Neurologic Exam Mental Status: Awake and fully alert. Oriented to place and time. Follows all commands. Speech and language normal.   Cranial Nerves: Fundoscopic exam without evidence of papilledema . Pupils equal, briskly reactive to light. Extraocular movements full without nystagmus. Visual fields full to confrontation. Hearing intact and symmetric to finger snap. Facial sensation intact. Face, tongue, palate move normally and symmetrically. Neck flexion and extension normal.  Motor: Normal bulk and tone. Normal strength in all tested extremity muscles.No focal weakness Coordination: Rapid alternating movements normal in all extremities.  Finger-to-nose  performed accurately bilaterally. Gait and Station: Arises from chair without difficulty. Stance is wide-based,  ambulates with a single-point cane   Reflexes: 2+ and symmetric. Toes downgoing.     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

## 2014-05-22 NOTE — Patient Instructions (Signed)
Sleep Apnea  Sleep apnea is a sleep disorder characterized by abnormal pauses in breathing while you sleep. When your breathing pauses, the level of oxygen in your blood decreases. This causes you to move out of deep sleep and into light sleep. As a result, your quality of sleep is poor, and the system that carries your blood throughout your body (cardiovascular system) experiences stress. If sleep apnea remains untreated, the following conditions can develop:  High blood pressure (hypertension).  Coronary artery disease.  Inability to achieve or maintain an erection (impotence).  Impairment of your thought process (cognitive dysfunction). There are three types of sleep apnea: 1. Obstructive sleep apnea--Pauses in breathing during sleep because of a blocked airway. 2. Central sleep apnea--Pauses in breathing during sleep because the area of the brain that controls your breathing does not send the correct signals to the muscles that control breathing. 3. Mixed sleep apnea--A combination of both obstructive and central sleep apnea. RISK FACTORS The following risk factors can increase your risk of developing sleep apnea:  Being overweight.  Smoking.  Having narrow passages in your nose and throat.  Being of older age.  Being female.  Alcohol use.  Sedative and tranquilizer use.  Ethnicity. Among individuals younger than 35 years, African Americans are at increased risk of sleep apnea. SYMPTOMS   Difficulty staying asleep.  Daytime sleepiness and fatigue.  Loss of energy.  Irritability.  Loud, heavy snoring.  Morning headaches.  Trouble concentrating.  Forgetfulness.  Decreased interest in sex. DIAGNOSIS  In order to diagnose sleep apnea, your caregiver will perform a physical examination. Your caregiver may suggest that you take a home sleep test. Your caregiver may also recommend that you spend the night in a sleep lab. In the sleep lab, several monitors record  information about your heart, lungs, and brain while you sleep. Your leg and arm movements and blood oxygen level are also recorded. TREATMENT The following actions may help to resolve mild sleep apnea:  Sleeping on your side.   Using a decongestant if you have nasal congestion.   Avoiding the use of depressants, including alcohol, sedatives, and narcotics.   Losing weight and modifying your diet if you are overweight. There also are devices and treatments to help open your airway:  Oral appliances. These are custom-made mouthpieces that shift your lower jaw forward and slightly open your bite. This opens your airway.  Devices that create positive airway pressure. This positive pressure "splints" your airway open to help you breathe better during sleep. The following devices create positive airway pressure:  Continuous positive airway pressure (CPAP) device. The CPAP device creates a continuous level of air pressure with an air pump. The air is delivered to your airway through a mask while you sleep. This continuous pressure keeps your airway open.  Nasal expiratory positive airway pressure (EPAP) device. The EPAP device creates positive air pressure as you exhale. The device consists of single-use valves, which are inserted into each nostril and held in place by adhesive. The valves create very little resistance when you inhale but create much more resistance when you exhale. That increased resistance creates the positive airway pressure. This positive pressure while you exhale keeps your airway open, making it easier to breath when you inhale again.  Bilevel positive airway pressure (BPAP) device. The BPAP device is used mainly in patients with central sleep apnea. This device is similar to the CPAP device because it also uses an air pump to deliver continuous air pressure   through a mask. However, with the BPAP machine, the pressure is set at two different levels. The pressure when you  exhale is lower than the pressure when you inhale.  Surgery. Typically, surgery is only done if you cannot comply with less invasive treatments or if the less invasive treatments do not improve your condition. Surgery involves removing excess tissue in your airway to create a wider passage way. Document Released: 10/15/2002 Document Revised: 02/19/2013 Document Reviewed: 03/02/2012 ExitCare Patient Information 2015 [employer]. This information is not intended to replace advice given to you by your health care provider. Make sure you discuss any questions you have with your health care provider.  

## 2014-05-27 ENCOUNTER — Telehealth: Payer: Self-pay

## 2014-05-27 DIAGNOSIS — E785 Hyperlipidemia, unspecified: Secondary | ICD-10-CM

## 2014-05-27 DIAGNOSIS — E119 Type 2 diabetes mellitus without complications: Secondary | ICD-10-CM

## 2014-05-27 NOTE — Telephone Encounter (Signed)
Diabetic bundle  mychart message sent  Labs ordered for lipid and A1C

## 2014-06-17 ENCOUNTER — Other Ambulatory Visit: Payer: Self-pay

## 2014-06-17 DIAGNOSIS — Z1231 Encounter for screening mammogram for malignant neoplasm of breast: Secondary | ICD-10-CM

## 2014-06-25 ENCOUNTER — Ambulatory Visit
Admission: RE | Admit: 2014-06-25 | Discharge: 2014-06-25 | Disposition: A | Discharge status: Home / Self Care | Payer: Medicare Other | Source: Ambulatory Visit

## 2014-06-25 DIAGNOSIS — Z1231 Encounter for screening mammogram for malignant neoplasm of breast: Secondary | ICD-10-CM

## 2014-06-25 LAB — HM DIABETES EYE EXAM

## 2014-07-01 ENCOUNTER — Ambulatory Visit (INDEPENDENT_AMBULATORY_CARE_PROVIDER_SITE_OTHER): Payer: Medicare Other | Admitting: Neurology

## 2014-07-01 ENCOUNTER — Encounter: Payer: Self-pay | Admitting: Neurology

## 2014-07-01 DIAGNOSIS — G4730 Sleep apnea, unspecified: Secondary | ICD-10-CM

## 2014-07-01 DIAGNOSIS — G4733 Obstructive sleep apnea (adult) (pediatric): Secondary | ICD-10-CM

## 2014-07-01 DIAGNOSIS — R442 Other hallucinations: Secondary | ICD-10-CM

## 2014-07-01 DIAGNOSIS — R569 Unspecified convulsions: Secondary | ICD-10-CM

## 2014-07-19 ENCOUNTER — Telehealth: Payer: Self-pay | Admitting: *Deleted

## 2014-07-19 NOTE — Telephone Encounter (Signed)
Spoke with patient and informed her of the MD's findings.  Pt was informed that she had severe obstructive sleep apnea and that the Dr. had written an order for her to be placed on CPAP.  Pt was instructed that a copy of the results would be sent to her and the referring MD, Marga Melnick.

## 2014-07-23 ENCOUNTER — Other Ambulatory Visit: Payer: Self-pay | Admitting: Neurology

## 2014-07-23 ENCOUNTER — Encounter: Payer: Self-pay | Admitting: Neurology

## 2014-07-23 DIAGNOSIS — G4733 Obstructive sleep apnea (adult) (pediatric): Secondary | ICD-10-CM

## 2014-08-15 ENCOUNTER — Telehealth: Payer: Self-pay

## 2014-08-15 NOTE — Telephone Encounter (Signed)
Left a message for call back.  Called patient regarding diabetic eye exam.  When patient calls back please ask:  Have you had a recent (2014-2015) eye exam?    Date of Exam?  Where?    

## 2014-08-15 NOTE — Telephone Encounter (Signed)
Husband called back and stated that patient had an eye exam 06/25/14--Dr. Emily [phone].  Called office and they stated that they would fax her report to [phone].

## 2014-08-16 NOTE — Telephone Encounter (Signed)
Fax received

## 2014-09-24 ENCOUNTER — Ambulatory Visit (INDEPENDENT_AMBULATORY_CARE_PROVIDER_SITE_OTHER): Payer: Medicare Other | Admitting: Family Medicine

## 2014-09-24 ENCOUNTER — Encounter: Payer: Self-pay | Admitting: Family Medicine

## 2014-09-24 ENCOUNTER — Encounter: Payer: Self-pay | Admitting: Neurology

## 2014-09-24 ENCOUNTER — Ambulatory Visit (HOSPITAL_BASED_OUTPATIENT_CLINIC_OR_DEPARTMENT_OTHER)
Admission: RE | Admit: 2014-09-24 | Discharge: 2014-09-24 | Disposition: A | Discharge status: Home / Self Care | Payer: Medicare Other | Source: Ambulatory Visit | Attending: Interventional Radiology | Admitting: Interventional Radiology

## 2014-09-24 VITALS — BP 125/73 | HR 78 | Ht 61.0 in | Wt 247.0 lb

## 2014-09-24 DIAGNOSIS — M19041 Primary osteoarthritis, right hand: Secondary | ICD-10-CM | POA: Insufficient documentation

## 2014-09-24 DIAGNOSIS — X58XXXA Exposure to other specified factors, initial encounter: Secondary | ICD-10-CM | POA: Insufficient documentation

## 2014-09-24 DIAGNOSIS — S6991XA Unspecified injury of right wrist, hand and finger(s), initial encounter: Secondary | ICD-10-CM | POA: Diagnosis present

## 2014-09-24 DIAGNOSIS — M7989 Other specified soft tissue disorders: Secondary | ICD-10-CM | POA: Insufficient documentation

## 2014-09-24 NOTE — Patient Instructions (Signed)
You have injured the extensor tendon that comes in to this joint. Wear the splint at all times - when you slip this off to wash the area make sure it stays straight!. Do so for 6 weeks then see me back in the office.

## 2014-09-25 DIAGNOSIS — S6990XA Unspecified injury of unspecified wrist, hand and finger(s), initial encounter: Secondary | ICD-10-CM | POA: Insufficient documentation

## 2014-09-25 NOTE — Progress Notes (Signed)
PCP: Marga MelnickWilliam Hopper, MD  Subjective:   HPI: Patient is a 68 y.o. female here for right 5th finger injury.  Patient reports about 2 weeks ago she was on a step, turned and fell down onto right knee. Bruised her knee but this has improved. However right 5th finger swollen, bruised, difficult to flex and extend mainly at PIP joint. Is right handed. Has been icing, taking ibuprofen.  Past Medical History  Diagnosis Date  . Hyperlipidemia   . Allergy   . Diabetes mellitus   . Asthma   . Atypical seizure 06/07/2012  . DVT (deep venous thrombosis)   . Olfactory aura   . Shingles 07/2013  . Headache(784.0)   . Hypersomnia with sleep apnea, unspecified 05/22/2014    Current Outpatient Prescriptions on File Prior to Visit  Medication Sig Dispense Refill  . buPROPion (WELLBUTRIN XL) 150 MG 24 hr tablet Take 150 mg by mouth daily.    . LamoTRIgine (LAMICTAL XR) 100 MG TB24 Take 100 mg by mouth daily.     Marland Kitchen. loratadine (CLARITIN) 10 MG tablet Take 10 mg by mouth daily.      . metFORMIN (GLUCOPHAGE-XR) 750 MG 24 hr tablet 1 tablet daily.    . sertraline (ZOLOFT) 50 MG tablet Take 50 mg by mouth daily.     No current facility-administered medications on file prior to visit.    Past Surgical History  Procedure Laterality Date  . Appendectomy    . Tonsillectomy     . Cholecystectomy    . Colonoscopy  2006    [facility]    Allergies  Allergen Reactions  . Mushroom Ext Cmplx(Shiitake-Reishi-Mait) Anaphylaxis  . Shellfish Allergy Anaphylaxis  . Sulfonamide Derivatives Anaphylaxis    unknown  . Acetaminophen     REACTION: STOMACH DISCOMFORT  . Butalbital-Aspirin-Caffeine     unknown  . Clarithromycin Nausea And Vomiting  . Fluticasone-Salmeterol     Feels like something in the throat  . Montelukast Sodium     unknown  . Moxifloxacin     unknown  . Orphenadrine Citrate     unknown  . Penicillins     unknown    History   Social History  . Marital Status: Married   Spouse Name: Lars MageJuan    Number of Children: 2  . Years of Education: College   Occupational History  . Not on file.   Social History Main Topics  . Smoking status: Never Smoker   . Smokeless tobacco: Never Used  . Alcohol Use: 0.0 oz/week    0 Not specified per week  . Drug Use: No  . Sexual Activity: Not on file   Other Topics Concern  . Not on file   Social History Narrative   Patient is married Lars Mage(Juan) and lives at home with her husband.   Patient has two adult children.   Patient is retired.   Patient has a college education.   Patient is right-handed.   Patient drinks one cup of tea daily.    Family History  Problem Relation Age of Onset  . Stomach cancer Father   . Coronary artery disease Father   . Breast cancer Sister     breast cancer  . Diabetes Maternal Aunt   . Diabetes Paternal Aunt   . Stroke Neg Hx   . Prostate cancer Brother     BP 125/73 mmHg  Pulse 78  Ht 5\' 1"  (1.549 m)  Wt 247 lb (112.038 kg)  BMI 46.69 kg/m2  Review of Systems: See HPI above.    Objective:  Physical Exam:  Gen: NAD  Right 5th digit: Possible developing boutonniere deformity.  Swelling, bruising centered around PIP joint.   Able to flex and extend against resistance. Collateral ligaments intact. TTP greatest dorsal 5th PIP joint. NVI distally.    Assessment & Plan:  1. Right 5th digit injury - radiographs negative for fracture.  Injury along with exam suggest a partial tear of central slip - surprisingly she is able to resist extension here suggesting this is not a complete tear.  Will go ahead with extension splinting for 6 weeks with 2 additional weeks of nighttime splinting.  Stressed importance that this joint remain in extension at all times for 6 weeks.  Reviewed how to wash the area.  F/u in 6 weeks.

## 2014-09-25 NOTE — Assessment & Plan Note (Signed)
radiographs negative for fracture.  Injury along with exam suggest a partial tear of central slip - surprisingly she is able to resist extension here suggesting this is not a complete tear.  Will go ahead with extension splinting for 6 weeks with 2 additional weeks of nighttime splinting.  Stressed importance that this joint remain in extension at all times for 6 weeks.  Reviewed how to wash the area.  F/u in 6 weeks.

## 2014-10-01 ENCOUNTER — Encounter: Payer: Self-pay | Admitting: *Deleted

## 2014-10-15 ENCOUNTER — Ambulatory Visit: Payer: Medicare Other | Admitting: Neurology

## 2014-10-16 ENCOUNTER — Encounter: Payer: Self-pay | Admitting: Neurology

## 2014-10-17 ENCOUNTER — Encounter: Payer: Self-pay | Admitting: Neurology

## 2014-10-17 ENCOUNTER — Ambulatory Visit (INDEPENDENT_AMBULATORY_CARE_PROVIDER_SITE_OTHER): Payer: Medicare Other | Admitting: Neurology

## 2014-10-17 VITALS — BP 144/77 | HR 90 | Resp 16 | Ht 62.0 in | Wt 254.0 lb

## 2014-10-17 DIAGNOSIS — E1340 Other specified diabetes mellitus with diabetic neuropathy, unspecified: Secondary | ICD-10-CM

## 2014-10-17 DIAGNOSIS — G4733 Obstructive sleep apnea (adult) (pediatric): Secondary | ICD-10-CM

## 2014-10-17 DIAGNOSIS — R2689 Other abnormalities of gait and mobility: Secondary | ICD-10-CM

## 2014-10-17 DIAGNOSIS — Z9989 Dependence on other enabling machines and devices: Secondary | ICD-10-CM

## 2014-10-17 DIAGNOSIS — G40219 Localization-related (focal) (partial) symptomatic epilepsy and epileptic syndromes with complex partial seizures, intractable, without status epilepticus: Secondary | ICD-10-CM

## 2014-10-17 HISTORY — DX: Other abnormalities of gait and mobility: R26.89

## 2014-10-17 HISTORY — DX: Localization-related (focal) (partial) symptomatic epilepsy and epileptic syndromes with complex partial seizures, intractable, without status epilepticus: G40.219

## 2014-10-17 HISTORY — DX: Morbid (severe) obesity due to excess calories: E66.01

## 2014-10-17 NOTE — Patient Instructions (Signed)
Fall Prevention and Home Safety °Falls cause injuries and can affect all age groups. It is possible to prevent falls.  °HOW TO PREVENT FALLS °· Wear shoes with rubber soles that do not have an opening for your toes. °· Keep the inside and outside of your house well lit. °· Use night lights throughout your home. °· Remove clutter from floors. °· Clean up floor spills. °· Remove throw rugs or fasten them to the floor with carpet tape. °· Do not place electrical cords across pathways. °· Put grab bars by your tub, shower, and toilet. Do not use towel bars as grab bars. °· Put handrails on both sides of the stairway. Fix loose handrails. °· Do not climb on stools or stepladders, if possible. °· Do not wax your floors. °· Repair uneven or unsafe sidewalks, walkways, or stairs. °· Keep items you use a lot within reach. °· Be aware of pets. °· Keep emergency numbers next to the telephone. °· Put smoke detectors in your home and near bedrooms. °Ask your doctor what other things you can do to prevent falls. °Document Released: 08/21/2009 Document Revised: 04/25/2012 Document Reviewed: 01/25/2012 °ExitCare® Patient Information ©2015 [employer]. This information is not intended to replace advice given to you by your health care provider. Make sure you discuss any questions you have with your health care provider. ° °

## 2014-10-17 NOTE — Progress Notes (Signed)
HPI: [name], 68 -year-old pleasant [place] lady returns for followup.   She had more falls, but none associated with a seizure or olfactory aura.   She was so dizzy she felt she fall , but she has less headaches and is less fatigued and sleepy on CPAP. She is 100 % compliant.   06-28-12  [name] and diagnosed with an olfactory aura, no EEG abnormalities. 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  The Keppra was well tolerated. She has only once a week any aura, not daily.  November 21, 2012. Patient developed depression, possibly worseing on Keppra which we weaned off. She is no longer taking Coumadin for the presumed upper extremity DVT ([facility] Azucena Kubaeid, [facility])  perhaps rather a thrombophlebitis. She was changed to Lamictal and was doing well on 100 mg XR daily in the AM  after a slow titration.  She will progress to 100 mg now.  05/21/13- followup visit today and patient has had 2 auras  since last seen. Her Lamictal dose is currently at 150 daily instead of 200 as Dr. Vickey Hugerohmeier had ordered.  Apparently she did not understand that she was supposed to continue to titrate to 200. She denies any side effects to the medication. He has no new neurologic complaints. She is currently receiving lumbar epidural steroids by Dr. Arta Brucehrisp for her low back pain 05-22-14 - psychaitrist recommend Wellbutrin, but changed to Zoloft after seizure concerns were discussed. She has better mood on Lamictal, was sad on Keppra.  She was [place] in [place] , [place] 2013, after a seizure or syncope on a cruise. Her husband fainted  and needed hospital admission , too.  Both Yankovich spend their [employer] Anniversary in a hospital in [place]. Her husband is concerned about her loud snoring, she is obese and has been very fatigued. She denies waking up with headaches. Both  have witnessed each others apneas.     ROS:  Weight gain, easy bruising, allergies, snoring  Physical Exam General: well developed, well nourished, seated, in no evident distress Head: head normocephalic and atraumatic. Oropharynx benign Neck: supple with no carotid  bruits Cardiovascular: regular rate and rhythm, no murmurs  Neurologic Exam Mental Status: Awake and fully alert. Oriented to place and time. Follows all commands. Speech and language normal.   Cranial Nerves: Fundoscopic exam without evidence of papilledema . Pupils equal, briskly reactive to light.  Extraocular movements full with saccades , not  nystagmus. Visual fields - she reports trouble seeing the floor .  Hearing intact and symmetric to finger snap. Facial sensation intact. Face, tongue, palate move normally and symmetrically.  Neck flexion and extension normal.  Motor: Normal bulk and tone. Normal strength in all tested extremity muscles.No focal weakness, but reports foot drop when her sugar is low !   Sensory : loss of vibration in toes.  Coordination: Rapid alternating movements normal in all extremities.  Finger-to-nose  performed accurately bilaterally. Gait and Station: Arises from chair without difficulty. Stance is wide-based, ambulates with a single-point cane   Reflexes: 2+ and symmetric. Toes downgoing.     ASSESSMENT:   1)Seizures with Olfactory aura which have responded to Lamictal, no longer having Auras . 2)OSA on CPAP: she had a pressure adjustment and has no longer headaches, the patient is currently using an auto CPAP between 4 and 12 cm  water allowing for a residual AHI of only 3.1. Her compliance was 100% for 30 days with 5 hours and 11 minutes of nightly use. The EPR level 3 cm water she does have a moderate air leak. She feels that after 4 hours she wakes up with a sudden higher pressure, perhaps due to leak? 3) obesity - unchanged,  4) diabetes poorly controlled- and may cause falls , too.   5) depression treated with Zoloft by Dr. Madaline Guthrie . 6) gait disorder, uses a cane, last fall was 6 weeks ago- broke her  Little finger on the right hand.       PLAN: Lamictal to 100 mg XR  1 tab daily,  need refills. Yearly CPAP revisit , will  schedule with her husband same day .  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  Fall prevention information given and urged to consider PT . Followup yearly and when necessary

## 2014-11-07 ENCOUNTER — Encounter: Payer: Self-pay | Admitting: Family Medicine

## 2014-11-07 ENCOUNTER — Ambulatory Visit (INDEPENDENT_AMBULATORY_CARE_PROVIDER_SITE_OTHER): Payer: Medicare Other | Admitting: Family Medicine

## 2014-11-07 VITALS — BP 134/83 | HR 69 | Ht 61.0 in

## 2014-11-07 DIAGNOSIS — S6991XD Unspecified injury of right wrist, hand and finger(s), subsequent encounter: Secondary | ICD-10-CM

## 2014-11-07 NOTE — Patient Instructions (Signed)
Wear the splint only at bedtime for 2 more weeks then stop using. Do some basic motion exercises (can use putty, clay, stress ball) to regain motion in that finger. Call us if you want to try occupational therapy if you're struggling after 3-4 weeks.

## 2014-11-11 NOTE — Progress Notes (Signed)
PCP: Marga Melnick, MD  Subjective:   HPI: Patient is a 69 y.o. female here for right 5th finger injury.  11/17: Patient reports about 2 weeks ago she was on a step, turned and fell down onto right knee. Bruised her knee but this has improved. However right 5th finger swollen, bruised, difficult to flex and extend mainly at PIP joint. Is right handed. Has been icing, taking ibuprofen.  12/31: Patient reports she is doing well. Only has pain if she hits finger on something. Husband helped fashion her a more comfortable extension splint which she has worn regularly since last visit. No other complaints.  Past Medical History  Diagnosis Date  . Hyperlipidemia   . Allergy   . Diabetes mellitus   . Asthma   . Atypical seizure 06/07/2012  . DVT (deep venous thrombosis)   . Olfactory aura   . Shingles 07/2013  . Headache(784.0)   . Hypersomnia with sleep apnea, unspecified 05/22/2014  . OSA on CPAP     Current Outpatient Prescriptions on File Prior to Visit  Medication Sig Dispense Refill  . buPROPion (WELLBUTRIN XL) 150 MG 24 hr tablet Take 150 mg by mouth daily.    . LamoTRIgine (LAMICTAL XR) 100 MG TB24 Take 100 mg by mouth daily.     Marland Kitchen loratadine (CLARITIN) 10 MG tablet Take 10 mg by mouth daily.      . metFORMIN (GLUCOPHAGE-XR) 750 MG 24 hr tablet 1 tablet daily.    . sertraline (ZOLOFT) 50 MG tablet Take 100 mg by mouth daily.     No current facility-administered medications on file prior to visit.    Past Surgical History  Procedure Laterality Date  . Appendectomy    . Tonsillectomy     . Cholecystectomy    . Colonoscopy  2006    [facility]    Allergies  Allergen Reactions  . Mushroom Ext Cmplx(Shiitake-Reishi-Mait) Anaphylaxis  . Shellfish Allergy Anaphylaxis  . Sulfonamide Derivatives Anaphylaxis    unknown  . Acetaminophen     REACTION: STOMACH DISCOMFORT  . Butalbital-Aspirin-Caffeine     unknown  . Clarithromycin Nausea And Vomiting  .  Fluticasone-Salmeterol     Feels like something in the throat  . Montelukast Sodium     unknown  . Moxifloxacin     unknown  . Orphenadrine Citrate     unknown  . Penicillins     unknown    History   Social History  . Marital Status: Married    Spouse Name: Lars Mage    Number of Children: 2  . Years of Education: College   Occupational History  . Not on file.   Social History Main Topics  . Smoking status: Never Smoker   . Smokeless tobacco: Never Used  . Alcohol Use: 0.0 oz/week    0 Not specified per week     Comment: occ  . Drug Use: No  . Sexual Activity: Not on file   Other Topics Concern  . Not on file   Social History Narrative   Patient is married Lars Mage) and lives at home with her husband.   Patient has two adult children.   Patient is retired.   Patient has a college education.   Patient is right-handed.   Patient drinks one cup of tea daily.    Family History  Problem Relation Age of Onset  . Stomach cancer Father   . Coronary artery disease Father   . Breast cancer Sister  breast cancer  . Diabetes Maternal Aunt   . Diabetes Paternal Aunt   . Stroke Neg Hx   . Prostate cancer Brother     BP 134/83 mmHg  Pulse 69  Ht  (1.549 m)  Review of Systems: See HPI above.    Objective:  Physical Exam:  Gen: NAD  Right 5th digit: No bruising.  Minimally more swollen PIP joint compared to others.   Able to flex and extend against resistance at PIP, DIP, MCP joints. Collateral ligaments intact. No TTP currently. NVI distally.    Assessment & Plan:  1. Right 5th digit injury - radiographs negative for fracture.  Injury along with exam suggest a partial tear of central slip - completed 6 weeks of extension splinting and has good resistance to extension.  Will do nighttime splinting for 2 more weeks then discontinue this.  Advised to call us if she would like to do occupational therapy if she's having trouble regaining motion.  Otherwise f/u  prn.

## 2014-11-11 NOTE — Assessment & Plan Note (Signed)
Right 5th digit injury - radiographs negative for fracture.  Injury along with exam suggest a partial tear of central slip - completed 6 weeks of extension splinting and has good resistance to extension.  Will do nighttime splinting for 2 more weeks then discontinue this.  Advised to call us if she would like to do occupational therapy if she's having trouble regaining motion.  Otherwise f/u prn.

## 2014-11-18 DIAGNOSIS — H0230 Blepharochalasis unspecified eye, unspecified eyelid: Secondary | ICD-10-CM | POA: Insufficient documentation

## 2014-11-25 ENCOUNTER — Encounter: Payer: Self-pay | Admitting: Neurology

## 2015-03-05 ENCOUNTER — Ambulatory Visit (INDEPENDENT_AMBULATORY_CARE_PROVIDER_SITE_OTHER): Payer: Medicare Other | Admitting: Family Medicine

## 2015-03-05 ENCOUNTER — Encounter: Payer: Self-pay | Admitting: Family Medicine

## 2015-03-05 VITALS — BP 109/72 | HR 69 | Ht 63.0 in | Wt 250.0 lb

## 2015-03-05 DIAGNOSIS — M25511 Pain in right shoulder: Secondary | ICD-10-CM

## 2015-03-05 DIAGNOSIS — M75101 Unspecified rotator cuff tear or rupture of right shoulder, not specified as traumatic: Secondary | ICD-10-CM

## 2015-03-05 DIAGNOSIS — M7541 Impingement syndrome of right shoulder: Secondary | ICD-10-CM

## 2015-03-05 NOTE — Patient Instructions (Signed)
You have rotator cuff impingement Try to avoid painful activities (overhead activities, lifting with extended arm) as much as possible. Ibuprofen 3 tabs three times a day with food for pain and inflammation. Can take tylenol in addition to this. Subacromial injection may be beneficial to help with pain and to decrease inflammation. Start physical therapy with transition to home exercise program. Do arm circles, pendulums, table slides once or twice a day. If not improving at follow-up we will consider further imaging, injection, and/or nitro patches. Follow up with me in 6 weeks though you can call sooner if you want an injection.

## 2015-03-07 DIAGNOSIS — M25511 Pain in right shoulder: Secondary | ICD-10-CM | POA: Insufficient documentation

## 2015-03-07 NOTE — Progress Notes (Signed)
PCP: Marga Melnick, MD  Subjective:   HPI: Patient is a 69 y.o. female here for right shoulder pain.  Patient reports she's had 3 weeks of worsening right shoulder pain. No known injury or increase in activity level. Pain can radiate down the arm and up to neck. Pain 7/10 at the worst. Worse reaching behind her. + night pain. Tried ibuprofen.  Past Medical History  Diagnosis Date  . Hyperlipidemia   . Allergy   . Diabetes mellitus   . Asthma   . Atypical seizure 06/07/2012  . DVT (deep venous thrombosis)   . Olfactory aura   . Shingles 07/2013  . Headache(784.0)   . Hypersomnia with sleep apnea, unspecified 05/22/2014  . OSA on CPAP     Current Outpatient Prescriptions on File Prior to Visit  Medication Sig Dispense Refill  . buPROPion (WELLBUTRIN XL) 150 MG 24 hr tablet Take 150 mg by mouth daily.    . LamoTRIgine (LAMICTAL XR) 100 MG TB24 Take 100 mg by mouth daily.     Marland Kitchen loratadine (CLARITIN) 10 MG tablet Take 10 mg by mouth daily.      . metFORMIN (GLUCOPHAGE-XR) 750 MG 24 hr tablet 1 tablet daily.    . sertraline (ZOLOFT) 50 MG tablet Take 100 mg by mouth daily.     No current facility-administered medications on file prior to visit.    Past Surgical History  Procedure Laterality Date  . Appendectomy    . Tonsillectomy     . Cholecystectomy    . Colonoscopy  2006    [facility]    Allergies  Allergen Reactions  . Mushroom Ext Cmplx(Shiitake-Reishi-Mait) Anaphylaxis  . Shellfish Allergy Anaphylaxis  . Sulfonamide Derivatives Anaphylaxis    unknown  . Acetaminophen     REACTION: STOMACH DISCOMFORT  . Butalbital-Aspirin-Caffeine     unknown  . Clarithromycin Nausea And Vomiting  . Fluticasone-Salmeterol     Feels like something in the throat  . Montelukast Sodium     unknown  . Moxifloxacin     unknown  . Orphenadrine Citrate     unknown  . Penicillins     unknown    History   Social History  . Marital Status: Married    Spouse Name: Lars Mage   . Number of Children: 2  . Years of Education: College   Occupational History  . Not on file.   Social History Main Topics  . Smoking status: Never Smoker   . Smokeless tobacco: Never Used  . Alcohol Use: 0.0 oz/week    0 Standard drinks or equivalent per week     Comment: occ  . Drug Use: No  . Sexual Activity: Not on file   Other Topics Concern  . Not on file   Social History Narrative   Patient is married Lars Mage) and lives at home with her husband.   Patient has two adult children.   Patient is retired.   Patient has a college education.   Patient is right-handed.   Patient drinks one cup of tea daily.    Family History  Problem Relation Age of Onset  . Stomach cancer Father   . Coronary artery disease Father   . Breast cancer Sister     breast cancer  . Diabetes Maternal Aunt   . Diabetes Paternal Aunt   . Stroke Neg Hx   . Prostate cancer Brother     BP 109/72 mmHg  Pulse 69  Ht  (1.6 m)  Wt  250 lb (113.399 kg)  BMI 44.30 kg/m2  Review of Systems: See HPI above.    Objective:  Physical Exam:  Gen: NAD  Right shoulder: No swelling, ecchymoses.  No gross deformity. TTP anterior shoulder joint. FROM but with painful arc. Positive Hawkins, Neers. Negative Yergasons. Strength 5/5 with empty can and resisted internal/external rotation.  Pain empty can and ER. Negative apprehension. NV intact distally.    Assessment & Plan:  1. Right shoulder pain - 2/2 rotator cuff impingement.  Shown home exercises to do daily, ibuprofen regularly.  Declined injection, nitro patches for now.  Will also start physical therapy.  F/u in 6 weeks.

## 2015-03-07 NOTE — Assessment & Plan Note (Signed)
2/2 rotator cuff impingement.  Shown home exercises to do daily, ibuprofen regularly.  Declined injection, nitro patches for now.  Will also start physical therapy.  F/u in 6 weeks.

## 2015-03-12 ENCOUNTER — Ambulatory Visit: Payer: Medicare Other | Attending: Family Medicine | Admitting: Physical Therapy

## 2015-03-12 ENCOUNTER — Encounter: Payer: Self-pay | Admitting: Physical Therapy

## 2015-03-12 DIAGNOSIS — R29898 Other symptoms and signs involving the musculoskeletal system: Secondary | ICD-10-CM | POA: Insufficient documentation

## 2015-03-12 DIAGNOSIS — M25511 Pain in right shoulder: Secondary | ICD-10-CM | POA: Insufficient documentation

## 2015-03-12 NOTE — Therapy (Signed)
[facility] [address]  [address] [place], [place], [address] Phone: [phone]   Fax:  [phone]  Physical Therapy Evaluation  Patient Details  Name: Nicole Mccann MRN: [id number] Date of Birth: 02/09/1946 Referring Provider:  Lenda Kelp, MD  Encounter Date: 03/12/2015      PT End of Session - 03/12/15 1403    Visit Number 1   Number of Visits 12   Date for PT Re-Evaluation 04/23/15   PT Start Time 1404   PT Stop Time 1450   PT Time Calculation (min) 46 min      Past Medical History  Diagnosis Date  . Hyperlipidemia   . Allergy   . Diabetes mellitus   . Asthma   . Atypical seizure 06/07/2012  . DVT (deep venous thrombosis)   . Olfactory aura   . Shingles 07/2013  . Headache(784.0)   . Hypersomnia with sleep apnea, unspecified 05/22/2014  . OSA on CPAP     Past Surgical History  Procedure Laterality Date  . Appendectomy    . Tonsillectomy     . Cholecystectomy    . Colonoscopy  2006    [facility]    There were no vitals filed for this visit.  Visit Diagnosis:  Pain in joint, shoulder region, right  Shoulder weakness      Subjective Assessment - 03/12/15 1408    Subjective Pt with c/o R Shoulder pain over the past 6 weeks (early April).  Unknown MOI.  She c/o intermittent difficulty with sleep (wakes at times due to pain).  No imaging to date.  Is performing exercises from Dr. Pearletha Forge.   Patient Stated Goals back to normal level of function   Currently in Pain? Yes   Pain Score 4   4/10 currently, 10/10 at worst   Pain Location Shoulder   Pain Orientation Right   Pain Radiating Towards R Shoulder pain extends into upper scapula and lateral neck at times and down arm upper arm just past elbow   Pain Frequency Intermittent   Aggravating Factors  reaching behind   Pain Relieving Factors ibuprofen, rest, avoid reaching   Effect of Pain on Daily Activities unable to clean self after  commode (husband assisting for now)            [facility] PT Assessment - 03/12/15 0001    Assessment   Medical Diagnosis R Shoulder pain   Onset Date 02/11/15   Balance Screen   Has the patient fallen in the past 6 months Yes   How many times? 1   Has the patient had a decrease in activity level because of a fear of falling?  Yes   Is the patient reluctant to leave their home because of a fear of falling?  No   Observation/Other Assessments   Focus on Therapeutic Outcomes (FOTO)  61% limitation   ROM / Strength   AROM / PROM / Strength AROM;PROM;Strength   AROM   Overall AROM Comments AROM limited by pain   AROM Assessment Site Shoulder   Right/Left Shoulder Right   Right Shoulder Flexion 125 Degrees  pain   Right Shoulder ABduction 70 Degrees  pain   PROM   Overall PROM Comments all PROM limited by pain   PROM Assessment Site Shoulder   Right/Left Shoulder Right   Right Shoulder Flexion 160 Degrees   Right Shoulder ABduction 95 Degrees   Right Shoulder Internal Rotation 65 Degrees  at 0  ABD   Right Shoulder External Rotation 75 Degrees  at 60 aBD   Strength   Strength Assessment Site Shoulder   Right/Left Shoulder Right   Right Shoulder Flexion 3+/5  pain   Right Shoulder Internal Rotation 4/5  pain   Right Shoulder External Rotation 3+/5  pain        TODAY'S TREATMENT: Manual - Grade 2 AP and Caudal glides to R GH, brief STM to R prox bicep tendon          PT Education - 03-19-15 1543    Education provided Yes   Education Details continue HEP from Dr Pearletha Forge, avoid reaching activities   Person(s) Educated Patient   Methods Explanation   Comprehension Verbalized understanding          PT Short Term Goals - 19-Mar-2015 1422    PT SHORT TERM GOAL #1   Title pt independent with initial HEP by 03/19/15   Status New           PT Long Term Goals - 19-Mar-2015 1554    PT LONG TERM GOAL #1   Title R Shoulder AROM WFL and MMT 4/5 or better by 04/23/15    Status New   PT LONG TERM GOAL #2   Title pt able to sleep without limitation by pain by 04/23/15   Status New   PT LONG TERM GOAL #3   Title pt able to perform all ADLs and chores without limitation by shoulder pain, weakness, or LOM by 04/23/15   Status New               Plan - 2015-03-19 1544    Clinical Impression Statement pt with R shoulder pain over the past 6 weeks.  Unkown MOI.  PROM is near normal and end-feel is guarded or limited by pain.  AROM and MMT limited by pain.  TTP at supraspinatus and at prox biceps tendon.  Special testing difficult to determine validity due to pt being quite guarded and hypersensite so most provocation tests were positive.  However, s/s seem to indicate subacromial impingent along with possible prox biceps tendonopathy.   Pt will benefit from skilled therapeutic intervention in order to improve on the following deficits Pain;Impaired UE functional use;Decreased strength;Decreased mobility;Decreased range of motion   Rehab Potential Good   PT Frequency 2x / week   PT Duration 6 weeks   PT Treatment/Interventions Manual techniques;Therapeutic exercise;Therapeutic activities;Electrical Stimulation;ADLs/Self Care Home Management;Patient/family education;Other (comment)  possible iontophoresis with 80 mA.min of Dex   PT Next Visit Plan manual to R GH grade 2-3 AP and caudal glides for pain reduction and improved mobility, STM to biceps tendon and upper scapular region, AAROM with wand vs pulley as able.   Consulted and Agree with Plan of Care Patient          G-Codes - Mar 19, 2015 1415    Functional Assessment Tool Used FOTO 61%   Functional Limitation Carrying, moving and handling objects   Carrying, Moving and Handling Objects Current Status [phone]) At least 60 percent but less than 80 percent impaired, limited or restricted   Carrying, Moving and Handling Objects Goal Status ([id number]) At least 20 percent but less than 40 percent impaired, limited  or restricted       Problem List Patient Active Problem List   Diagnosis Date Noted  . Right shoulder pain 03/07/2015  . Seizure disorder, temporal lobe, intractable 10/17/2014  . 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 Neuropathy due to secondary diabetes  mellitus 10/17/2014  . Severe obesity (BMI >= 40) 10/17/2014  . OSA on CPAP 10/17/2014  . Finger injury 09/25/2014  . Hypersomnia with sleep apnea, unspecified 05/22/2014  . Injury of left knee 08/22/2013  . Variants of migraine, not elsewhere classified, without mention of intractable migraine without mention of status migrainosus 05/22/2013  . 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 Spinal stenosis of lumbar region 12/17/2012  . Pulmonary embolism 07/09/2012  . DVT (deep venous thrombosis) 07/07/2012  . Atypical seizure 06/07/2012  . Phlebitis following infusion 06/05/2012  . BENIGN POSITIONAL VERTIGO 11/06/2009  . DIABETES MELLITUS, TYPE II 04/04/2009  . ALLERGIC RHINITIS 04/04/2009  . ASTHMA 04/04/2009  . DIVERTICULOSIS, COLON 04/04/2009  . HYPERLIPIDEMIA NEC/NOS 06/29/2007  . DISORDER, DYSMETABOLIC SYNDROME X 06/29/2007    Acacia Latorre PT, OCS 03/12/2015, 3:59 PM  [facility] [address]  [address] [place], [place], [address] Phone: [phone]   Fax:  [phone]

## 2015-03-13 ENCOUNTER — Ambulatory Visit: Payer: Medicare Other

## 2015-03-13 ENCOUNTER — Encounter: Payer: Self-pay | Admitting: Internal Medicine

## 2015-03-13 ENCOUNTER — Ambulatory Visit (INDEPENDENT_AMBULATORY_CARE_PROVIDER_SITE_OTHER): Payer: Medicare Other | Admitting: Internal Medicine

## 2015-03-13 VITALS — BP 112/72 | HR 70 | Temp 98.0°F | Ht 60.0 in | Wt 253.0 lb

## 2015-03-13 DIAGNOSIS — R2689 Other abnormalities of gait and mobility: Secondary | ICD-10-CM

## 2015-03-13 DIAGNOSIS — Z23 Encounter for immunization: Secondary | ICD-10-CM

## 2015-03-13 DIAGNOSIS — S43429A Sprain of unspecified rotator cuff capsule, initial encounter: Secondary | ICD-10-CM

## 2015-03-13 DIAGNOSIS — Z0000 Encounter for general adult medical examination without abnormal findings: Secondary | ICD-10-CM | POA: Diagnosis not present

## 2015-03-13 DIAGNOSIS — S43421D Sprain of right rotator cuff capsule, subsequent encounter: Secondary | ICD-10-CM

## 2015-03-13 DIAGNOSIS — F4323 Adjustment disorder with mixed anxiety and depressed mood: Secondary | ICD-10-CM | POA: Diagnosis not present

## 2015-03-13 HISTORY — DX: Sprain of unspecified rotator cuff capsule, initial encounter: S43.429A

## 2015-03-13 MED ORDER — CYANOCOBALAMIN 1000 MCG/ML IJ SOLN: 1000.0000 ug | Freq: Once | INTRAMUSCULAR | Status: DC

## 2015-03-13 NOTE — Patient Instructions (Addendum)
Preventive Health Care: Eat a low-fat diet with lots of fruits and vegetables, up to 7-9 servings per day. This would eliminate need for vitamin supplements for most individuals. Consume less than 30 grams of sugar per day from foods & drinks with High Fructose Corn Syrup as #2,3 or #4 on label. Seatbelts can save your life. Wear them always. Smoke detectors on every level of your home, check batteries every year. Eye Doctor - have an eye exam @ least annually Alcohol If you drink, do it moderately - 1 drink per day or less.  The legal document "Health Care Power of Attorney & Living Will " verifies decisions concerning your health care.  Need for Colonoscopic survelliance will be researched.? Last done 2006   Appropriate labs will be ordered as indicated after reviewing Dr. Ophelia Charter results.

## 2015-03-13 NOTE — Progress Notes (Signed)
Subjective:    Patient ID: Nicole Mccann, female    DOB: 1945-11-22, 69 y.o.   MRN: [id number]  HPI Medicare Wellness Visit: Psychosocial and medical history were reviewed as required by Medicare (history related to abuse, antisocial behavior , firearm risk). Social history: Caffeine: 1 cup occasionally; 2-3 cups decaf tea Alcohol: 1 glass/ day Tobacco ANV:BTYOM Exercise:no Personal safety/fall risk: falling due to weakness in knees & not being cautious.No cardioneuro prodrome. Limitations of activities of daily living:requires help dressing due to rotator cuff disorder ; seeing Dr Pearletha Forge & starting Physical Therapy Seatbelt/ smoke alarm use: yes Healthcare Power of Attorney/Living Will status and End of Life process assessment : needed Ophthalmologic exam status:UTD Hearing evaluation status: not UTD Orientation: Oriented X 3 Memory and recall: good Math testing: good Depression/anxiety assessment: seeing Dr Madaline Guthrie & on active treatment Foreign travel history: [place] 1.5 years ago Immunization status for influenza/pneumonia/ shingles /tetanus:both PNA shots needed Transfusion history: no Preventive health care maintenance status: Colonoscopy as per protocol/standard care:? 2006;?due Dental care: due Chart reviewed and updated. Active issues reviewed and addressed as documented below.  Dr Chestine Spore manages DM & monitors lipids.No excess sweets; modified low carb. His recent labs will be reviewed & additional studies ordered as clinically indicated.   Review of Systems Denied were any change in heart rhythm or rate prior to the falls. There was no associated chest pain or shortness of breath . Also specifically denied prior to the episode were headache, limb weakness, tingling, or numbness. No seizure activity noted. She is concerned about a lesion of the RU back which is tender to touch @ times.No change in size or color noted. Asthma is quiescent. Unexplained weight loss,  abdominal pain, significant dyspepsia, dysphagia, melena, rectal bleeding, or persistently small caliber stools are denied.    Objective:   Physical Exam  Gen.: Adequately nourished in appearance. Alert, appropriate and cooperative throughout exam. BMI:49.41 Appears younger than stated age  Head: Normocephalic without obvious abnormalities  Eyes: No corneal or conjunctival inflammation noted. Pupils equal round reactive to light and accommodation. Extraocular motion intact.  Ears: External  ear exam reveals no significant lesions or deformities. Canals clear .TMs normal. Hearing is grossly normal bilaterally. Nose: External nasal exam reveals no deformity or inflammation. Nasal mucosa are pink and moist. No lesions or exudates noted.   Mouth: Oral mucosa and oropharynx reveal no lesions or exudates. Teeth in good repair.Upper partial Neck: No deformities, masses, or tenderness noted. Range of motion decreased. Thyroid normal. Lungs: Normal respiratory effort; chest expands symmetrically. Lungs are clear to auscultation without rales, wheezes, or increased work of breathing. Heart: Distant heart sounds.Normal rate and rhythm. Normal S1 and S2. No gallop, click, or rub. No murmur. Abdomen: Massive.Bowel sounds normal; abdomen soft and nontender. No masses, organomegaly or hernias noted. Genitalia: as per Gyn                                  Musculoskeletal/extremities: Accentuated curvature of upper thoracic spine. No clubbing, cyanosis, edema, or significant extremity  deformity noted.  Range of motion decreased,most notably R shoulder. Tone normal;generally decreased strength .Needs help getting onto exam table. Hand joints normal  Fingernail  health good. Crepitus of knees  Requires help to lie down & sit up .  Negative SLR bilaterally Vascular: Carotid, radial artery, dorsalis pedis and  posterior tibial pulses are equal. Decreased pedal pulses, especially PTP.No bruits  present. Neurologic: Alert and oriented x3. Deep tendon reflexes symmetrical ;brisk @ knees.  Gait broad   Skin: Intact without suspicious lesions or rashes. 1X1 classic lipoma RU back Lymph: No cervical, axillary, or inguinal lymphadenopathy present. Psych: Slightly anxious. Normally interactive                                                                                      Assessment & Plan:  See Current Assessment & Plan in Problem List under specific DiagnosisThe labs will be reviewed and risks and options assessed. Written recommendations will be provided by mail or directly through My Chart.Further evaluation or change in medical therapy will be directed by those results. Major limitations for Nicole Mccann include  OSA,body habitus, gait instability, debilitation, and anxiety. Repeat colonoscopy may be associated with high risk.

## 2015-03-13 NOTE — Assessment & Plan Note (Signed)
As per Physical Therapy & Dr Pearletha Forge

## 2015-03-13 NOTE — Assessment & Plan Note (Signed)
As per Dr Chestine Spore

## 2015-03-13 NOTE — Progress Notes (Signed)
Pre visit review using our clinic review tool, if applicable. No additional management support is needed unless otherwise documented below in the visit note. 

## 2015-03-14 ENCOUNTER — Encounter: Payer: Self-pay | Admitting: Internal Medicine

## 2015-03-14 DIAGNOSIS — F4323 Adjustment disorder with mixed anxiety and depressed mood: Secondary | ICD-10-CM | POA: Insufficient documentation

## 2015-03-14 HISTORY — DX: Adjustment disorder with mixed anxiety and depressed mood: F43.23

## 2015-03-14 NOTE — Assessment & Plan Note (Signed)
Quiescent;no change indicated

## 2015-03-18 ENCOUNTER — Ambulatory Visit: Payer: Medicare Other | Admitting: Physical Therapy

## 2015-03-18 DIAGNOSIS — M25511 Pain in right shoulder: Secondary | ICD-10-CM

## 2015-03-18 DIAGNOSIS — R29898 Other symptoms and signs involving the musculoskeletal system: Secondary | ICD-10-CM

## 2015-03-18 NOTE — Therapy (Signed)
[facility] [address]  [address] [place], [place], [address] Phone: [phone]   Fax:  [phone]  Physical Therapy Treatment  Patient Details  Name: Nicole Mccann MRN: [id number] Date of Birth: 1945-12-06 Referring Provider:  Lenda Kelp, MD  Encounter Date: 03/18/2015      PT End of Session - 03/18/15 1426    Visit Number 2   Number of Visits 12   Date for PT Re-Evaluation 04/23/15   PT Start Time 1403   PT Stop Time 1452   PT Time Calculation (min) 49 min      Past Medical History  Diagnosis Date  . Hyperlipidemia   . Allergy   . Diabetes mellitus   . Asthma   . Atypical seizure 06/07/2012  . DVT (deep venous thrombosis)   . Olfactory aura   . Shingles 07/2013  . Headache(784.0)   . Hypersomnia with sleep apnea, unspecified 05/22/2014  . OSA on CPAP   . Anxiety     Dr Madaline Guthrie  . Depression     Dr Madaline Guthrie    Past Surgical History  Procedure Laterality Date  . Appendectomy    . Tonsillectomy     . Cholecystectomy    . Colonoscopy  2006    [facility]    There were no vitals filed for this visit.  Visit Diagnosis:  Pain in joint, shoulder region, right  Shoulder weakness      Subjective Assessment - 03/18/15 1424    Subjective pt states she has been performing HEP and notes benefit from this.  States felt pretty good yesterday and day prior but pain returned today.  She states she has been cooking more yesterday and today due to her daughter being in town.   Currently in Pain? Yes   Pain Score 6    Pain Location Shoulder   Pain Orientation Right;Anterior           TODAY'S TREATMENT Manual - gentle STM and TPR R bicep muscle and prox tendon, Grade 2 and 3 AP mobes to R GH.  R GH gentle distraction with PROM Flexion and ABD as tolerated.  Good benefit with manual. [place] - 20%, 5cm, 0.5W/cm2, 8', 1.0MHz R Prox Bicep Vaso R Shoulder, Low Pressure, 40dg,  15'                        PT Short Term Goals - 03/18/15 1428    PT SHORT TERM GOAL #1   Title pt independent with initial HEP by 03/19/15   Status Achieved           PT Long Term Goals - 03/18/15 1428    PT LONG TERM GOAL #1   Title R Shoulder AROM WFL and MMT 4/5 or better by 04/23/15   Status On-going   PT LONG TERM GOAL #2   Title pt able to sleep without limitation by pain by 04/23/15   Status On-going   PT LONG TERM GOAL #3   Title pt able to perform all ADLs and chores without limitation by shoulder pain, weakness, or LOM by 04/23/15   Status On-going               Plan - 03/18/15 1426    Clinical Impression Statement pt with very TTP R prox  bicep tendon and high tone throughout bicep muscle.  Responded well to gentle STM and TPR into bicep.  Seems likely  prox bicep overuse injury is chief source of today's symptoms but possible subacromial impingement / RC strain also noted at initial eval.   PT Next Visit Plan manual to R GH grade 2-3 AP and caudal glides for pain reduction and improved mobility, STM to biceps tendon and upper scapular region, AAROM with wand vs pulley as able.   Consulted and Agree with Plan of Care Patient        Problem List Patient Active Problem List   Diagnosis Date Noted  . Adjustment disorder with mixed anxiety and depressed mood 03/14/2015  . Rotator cuff (capsule) sprain 03/13/2015  . Seizure disorder, temporal lobe, intractable 10/17/2014  . 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 Neuropathy due to secondary diabetes mellitus 10/17/2014  . Severe obesity (BMI >= 40) 10/17/2014  . OSA on CPAP 10/17/2014  . Hypersomnia with sleep apnea, unspecified 05/22/2014  . Injury of left knee 08/22/2013  . Variants of migraine, not elsewhere classified, without mention of intractable migraine without mention of status migrainosus 05/22/2013  . 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 Spinal stenosis  of lumbar region 12/17/2012  . Pulmonary embolism 07/09/2012  . DVT (deep venous thrombosis) 07/07/2012  . Atypical seizure 06/07/2012  . Phlebitis following infusion 06/05/2012  . BENIGN POSITIONAL VERTIGO 11/06/2009  . DIABETES MELLITUS, TYPE II 04/04/2009  . ALLERGIC RHINITIS 04/04/2009  . ASTHMA 04/04/2009  . DIVERTICULOSIS, COLON 04/04/2009  . HYPERLIPIDEMIA NEC/NOS 06/29/2007  . DISORDER, DYSMETABOLIC SYNDROME X 06/29/2007    Alver Leete PT, OCS 03/18/2015, 2:40 PM  [facility] [address]  [address] [place], [place], [address] Phone: [phone]   Fax:  [phone]

## 2015-03-20 ENCOUNTER — Ambulatory Visit: Payer: Medicare Other | Admitting: Physical Therapy

## 2015-03-20 DIAGNOSIS — R29898 Other symptoms and signs involving the musculoskeletal system: Secondary | ICD-10-CM

## 2015-03-20 DIAGNOSIS — M25511 Pain in right shoulder: Secondary | ICD-10-CM

## 2015-03-20 NOTE — Therapy (Signed)
[facility] Outpatient Rehabilitation [facility] [address]  [address] [place], [place], [address] Phone: [phone]   Fax:  [phone]  Physical Therapy Treatment  Patient Details  Name: Nicole Mccann MRN: [id number] Date of Birth: 1945-11-12 Referring Provider:  Lenda Kelp, MD  Encounter Date: 03/20/2015      PT End of Session - 03/20/15 1548    Visit Number 3   Number of Visits 12   Date for PT Re-Evaluation 04/23/15   PT Start Time 1540   PT Stop Time 1620   PT Time Calculation (min) 40 min      Past Medical History  Diagnosis Date  . Hyperlipidemia   . Allergy   . Diabetes mellitus   . Asthma   . Atypical seizure 06/07/2012  . DVT (deep venous thrombosis)   . Olfactory aura   . Shingles 07/2013  . Headache(784.0)   . Hypersomnia with sleep apnea, unspecified 05/22/2014  . OSA on CPAP   . Anxiety     Dr Madaline Guthrie  . Depression     Dr Madaline Guthrie    Past Surgical History  Procedure Laterality Date  . Appendectomy    . Tonsillectomy     . Cholecystectomy    . Colonoscopy  2006    [facility]    There were no vitals filed for this visit.  Visit Diagnosis:  Pain in joint, shoulder region, right  Shoulder weakness      Subjective Assessment - 03/20/15 1627    Subjective pt with c/o increased R anterior shoulder pain today.  She states she begins to feel better so she uses shoulder more and then finds herself with worse and worse pain.   Currently in Pain? Yes   Pain Score 9    Pain Location Shoulder   Pain Orientation Right;Anterior        TODAY'S TREATMENT Manual - gentle STM R bicep muscle and prox tendon (exceptionally tender) [place] - 20%, 5cm, 0.5W/cm2, 8', 1.0MHz R Prox Bicep More Manual - TPR R pectorals, Grade 2 AP mobes to R GH. 3 Strips kinesiotape R anterior shoulder (30% anterior deltoid, 30% bicep and tendon, 70% across prox bicep) Pt reports feeling "much better" following treatment.        PT Short Term  Goals - 03/18/15 1428    PT SHORT TERM GOAL #1   Title pt independent with initial HEP by 03/19/15   Status Achieved           PT Long Term Goals - 03/18/15 1428    PT LONG TERM GOAL #1   Title R Shoulder AROM WFL and MMT 4/5 or better by 04/23/15   Status On-going   PT LONG TERM GOAL #2   Title pt able to sleep without limitation by pain by 04/23/15   Status On-going   PT LONG TERM GOAL #3   Title pt able to perform all ADLs and chores without limitation by shoulder pain, weakness, or LOM by 04/23/15   Status On-going               Plan - 03/20/15 1630    Clinical Impression Statement pt with increased R anterior shoulder pain today.  She states she has been using shoulder more yesterday and today (sewing, dishes, etc) due to feeling better and now with severe shoulder pain.  Very TTP to proximal biceps tendon with palpable inflammation present.  Tenderness continues into biceps muscle as well as R pectoral  muscle.  Responded well to treatment and notes decreased pain following treatment.  A sling was applied to R UE to assist with pt not using R arm (sling was brought down from Dr. Lazaro [facility] office).   PT Next Visit Plan manual to R GH grade 2-3 AP and caudal glides for pain reduction and improved mobility, STM to biceps tendon and upper scapular region, AAROM with wand vs pulley as able.   Consulted and Agree with Plan of Care Patient        Problem List Patient Active Problem List   Diagnosis Date Noted  . Adjustment disorder with mixed anxiety and depressed mood 03/14/2015  . Rotator cuff (capsule) sprain 03/13/2015  . Seizure disorder, temporal lobe, intractable 10/17/2014  . 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 Neuropathy due to secondary diabetes mellitus 10/17/2014  . Severe obesity (BMI >= 40) 10/17/2014  . OSA on CPAP 10/17/2014  . Hypersomnia with sleep apnea, unspecified 05/22/2014  . Injury of left knee 08/22/2013  . Variants of migraine, not  elsewhere classified, without mention of intractable migraine without mention of status migrainosus 05/22/2013  . 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 Spinal stenosis of lumbar region 12/17/2012  . Pulmonary embolism 07/09/2012  . DVT (deep venous thrombosis) 07/07/2012  . Atypical seizure 06/07/2012  . Phlebitis following infusion 06/05/2012  . BENIGN POSITIONAL VERTIGO 11/06/2009  . DIABETES MELLITUS, TYPE II 04/04/2009  . ALLERGIC RHINITIS 04/04/2009  . ASTHMA 04/04/2009  . DIVERTICULOSIS, COLON 04/04/2009  . HYPERLIPIDEMIA NEC/NOS 06/29/2007  . DISORDER, DYSMETABOLIC SYNDROME X 06/29/2007    Guillaume Weninger PT, OCS 03/20/2015, 4:34 PM  [facility] [address]  [address] [place], [place], [address] Phone: [phone]   Fax:  [phone]

## 2015-03-27 ENCOUNTER — Ambulatory Visit: Payer: Medicare Other | Admitting: Rehabilitation

## 2015-03-27 ENCOUNTER — Encounter: Payer: Self-pay | Admitting: Rehabilitation

## 2015-03-27 DIAGNOSIS — M25511 Pain in right shoulder: Secondary | ICD-10-CM | POA: Diagnosis not present

## 2015-03-27 DIAGNOSIS — R29898 Other symptoms and signs involving the musculoskeletal system: Secondary | ICD-10-CM

## 2015-03-27 NOTE — Therapy (Signed)
[facility] [address]  [address] [place], [place], [address] Phone: [phone]   Fax:  [phone]  Physical Therapy Treatment  Patient Details  Name: Nicole Mccann MRN: [id number] Date of Birth: May 23, 1946 Referring Provider:  Lenda Kelp, MD  Encounter Date: 03/27/2015      PT End of Session - 03/27/15 1750    Visit Number 4   Number of Visits 12   Date for PT Re-Evaluation 04/23/15   PT Start Time 1705   PT Stop Time 1745   PT Time Calculation (min) 40 min   Activity Tolerance Patient limited by pain      Past Medical History  Diagnosis Date  . Hyperlipidemia   . Allergy   . Diabetes mellitus   . Asthma   . Atypical seizure 06/07/2012  . DVT (deep venous thrombosis)   . Olfactory aura   . Shingles 07/2013  . Headache(784.0)   . Hypersomnia with sleep apnea, unspecified 05/22/2014  . OSA on CPAP   . Anxiety     Dr Madaline Guthrie  . Depression     Dr Madaline Guthrie    Past Surgical History  Procedure Laterality Date  . Appendectomy    . Tonsillectomy     . Cholecystectomy    . Colonoscopy  2006    [facility]    There were no vitals filed for this visit.  Visit Diagnosis:  Pain in joint, shoulder region, right  Shoulder weakness      Subjective Assessment - 03/27/15 1705    Subjective Had bruising upon tape removal due to pulling it off too quickly.   The shoulder is also hurting badly.  Considering an injection. Today is bad for some reason, unknown.     Currently in Pain? Yes   Pain Score 9    Pain Location Shoulder   Pain Orientation Right   Aggravating Factors  any movement above 90deg   Pain Relieving Factors wearing the sling      TODAY'S TREATMENT Seated Manual - gentle STM R UT, supraspinatus, anterior and lateral deltoid, and bicep muscle and prox tendon (exceptionally tender) [place] - 20%, 5cm, 1.0W/cm2, 8', 1.0MHz R Prox Bicep Standing scapular retractions with cueing for painfree  movements and decreasing UE movement, posterior shoulder rolls  Pt reports feeling "much better" following treatment.                               PT Short Term Goals - 03/18/15 1428    PT SHORT TERM GOAL #1   Title pt independent with initial HEP by 03/19/15   Status Achieved           PT Long Term Goals - 03/18/15 1428    PT LONG TERM GOAL #1   Title R Shoulder AROM WFL and MMT 4/5 or better by 04/23/15   Status On-going   PT LONG TERM GOAL #2   Title pt able to sleep without limitation by pain by 04/23/15   Status On-going   PT LONG TERM GOAL #3   Title pt able to perform all ADLs and chores without limitation by shoulder pain, weakness, or LOM by 04/23/15   Status On-going               Plan - 03/27/15 1751    Clinical Impression Statement unable to tolerate much today due to wanting to remain seated.  pt  was educated on tape removal if used in the future, but may not want to due to bruising.  pt considering getting an injection next week due to continued pain   PT Next Visit Plan manual work and TE as tolerated, modalities        Problem List Patient Active Problem List   Diagnosis Date Noted  . Adjustment disorder with mixed anxiety and depressed mood 03/14/2015  . Rotator cuff (capsule) sprain 03/13/2015  . Seizure disorder, temporal lobe, intractable 10/17/2014  . 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 Neuropathy due to secondary diabetes mellitus 10/17/2014  . Severe obesity (BMI >= 40) 10/17/2014  . OSA on CPAP 10/17/2014  . Hypersomnia with sleep apnea, unspecified 05/22/2014  . Injury of left knee 08/22/2013  . Variants of migraine, not elsewhere classified, without mention of intractable migraine without mention of status migrainosus 05/22/2013  . 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 Spinal stenosis of lumbar region 12/17/2012  . Pulmonary embolism 07/09/2012  . DVT (deep venous thrombosis)  07/07/2012  . Atypical seizure 06/07/2012  . Phlebitis following infusion 06/05/2012  . BENIGN POSITIONAL VERTIGO 11/06/2009  . DIABETES MELLITUS, TYPE II 04/04/2009  . ALLERGIC RHINITIS 04/04/2009  . ASTHMA 04/04/2009  . DIVERTICULOSIS, COLON 04/04/2009  . HYPERLIPIDEMIA NEC/NOS 06/29/2007  . DISORDER, DYSMETABOLIC SYNDROME X 06/29/2007    Idamae Lusher, DPT, CMP 03/27/2015, 5:58 PM  [facility] [address]  [address] [place], [place], [address] Phone: [phone]   Fax:  [phone]

## 2015-03-31 ENCOUNTER — Ambulatory Visit: Payer: Medicare Other | Admitting: Physical Therapy

## 2015-04-02 ENCOUNTER — Ambulatory Visit: Payer: Medicare Other | Admitting: Physical Therapy

## 2015-04-02 DIAGNOSIS — M25511 Pain in right shoulder: Secondary | ICD-10-CM

## 2015-04-02 NOTE — Therapy (Addendum)
[facility] [address]  [address] [place], [place], [address] Phone: [phone]   Fax:  [phone]  Physical Therapy Treatment  Patient Details  Name: Nicole Mccann MRN: [id number] Date of Birth: 1946/08/14 Referring Provider:  Lenda Kelp, MD  Encounter Date: 04/02/2015      PT End of Session - 04/02/15 1512    Visit Number 5   Number of Visits 12   Date for PT Re-Evaluation 04/23/15   PT Start Time 1458   PT Stop Time 1535   PT Time Calculation (min) 37 min      Past Medical History  Diagnosis Date  . Hyperlipidemia   . Allergy   . Diabetes mellitus   . Asthma   . Atypical seizure 06/07/2012  . DVT (deep venous thrombosis)   . Olfactory aura   . Shingles 07/2013  . Headache(784.0)   . Hypersomnia with sleep apnea, unspecified 05/22/2014  . OSA on CPAP   . Anxiety     Dr Madaline Guthrie  . Depression     Dr Madaline Guthrie    Past Surgical History  Procedure Laterality Date  . Appendectomy    . Tonsillectomy     . Cholecystectomy    . Colonoscopy  2006    [facility]    There were no vitals filed for this visit.  Visit Diagnosis:  Pain in joint, shoulder region, right      Subjective Assessment - 04/02/15 1504    Subjective Pt states overall has not noted improvement in pain since beginning PT.  She is having difficulty sleeping due to pain.  States sling sems to help with pain while using.  States forgot to use sling yesterday and noted severe pain that evening and into the night.  States she is having more difficulty with reaching even into low cupboards at this time.   Currently in Pain? Yes   Pain Score 9   ranges 4/10 at best and 9/10 at worst   Pain Location Shoulder   Pain Orientation Right          TODAY'S TREATMENT Manual - STM R upper trap and supraspinatus (very tender and pt states this is where she notes most night time pain), STM lateral and anterior deltoid and into prox biceps  tendon (very tender).  Seated manual shoulder rolls with GH distraction to assist with reduced pain                PT Short Term Goals - 03/18/15 1428    PT SHORT TERM GOAL #1   Title pt independent with initial HEP by 03/19/15   Status Achieved           PT Long Term Goals - 03/18/15 1428    PT LONG TERM GOAL #1   Title R Shoulder AROM WFL and MMT 4/5 or better by 04/23/15   Status On-going   PT LONG TERM GOAL #2   Title pt able to sleep without limitation by pain by 04/23/15   Status On-going   PT LONG TERM GOAL #3   Title pt able to perform all ADLs and chores without limitation by shoulder pain, weakness, or LOM by 04/23/15   Status On-going               Plan - 04/02/15 1541    Clinical Impression Statement no progress to date, pt is returning to MD for injection within the next week and then return  to PT after this to determine if we can progress her at that point.  S/S consistent with prox biceps overuse injury along with likely supraspinatus injury.,   PT Next Visit Plan re-assess after injection, manual and modalities for pain, RC strengthening if able   Consulted and Agree with Plan of Care Patient        Problem List Patient Active Problem List   Diagnosis Date Noted  . Adjustment disorder with mixed anxiety and depressed mood 03/14/2015  . Rotator cuff (capsule) sprain 03/13/2015  . Seizure disorder, temporal lobe, intractable 10/17/2014  . 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 Neuropathy due to secondary diabetes mellitus 10/17/2014  . Severe obesity (BMI >= 40) 10/17/2014  . OSA on CPAP 10/17/2014  . Hypersomnia with sleep apnea, unspecified 05/22/2014  . Injury of left knee 08/22/2013  . Variants of migraine, not elsewhere classified, without mention of intractable migraine without mention of status migrainosus 05/22/2013  . 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 Spinal stenosis of lumbar region 12/17/2012  .  Pulmonary embolism 07/09/2012  . DVT (deep venous thrombosis) 07/07/2012  . Atypical seizure 06/07/2012  . Phlebitis following infusion 06/05/2012  . BENIGN POSITIONAL VERTIGO 11/06/2009  . DIABETES MELLITUS, TYPE II 04/04/2009  . ALLERGIC RHINITIS 04/04/2009  . ASTHMA 04/04/2009  . DIVERTICULOSIS, COLON 04/04/2009  . HYPERLIPIDEMIA NEC/NOS 06/29/2007  . DISORDER, DYSMETABOLIC SYNDROME X 06/29/2007    Henreitta Spittler PT, OCS 04/02/2015, 3:43 PM  [facility] [address]  [address] [place], [place], [address] Phone: [phone]   Fax:  [phone]   Late entry g-code: Functional Assessment Tool Used FOTO  Functional Limitation Carrying, Moving, and Handling Objects     Goal Status CJ  Discharge Status CL   Clarita Crane, PT, DPT 05/22/2015 9:18 AM        PHYSICAL THERAPY DISCHARGE SUMMARY  Visits from Start of Care: 5  Current functional level related to goals / functional outcomes: See above; pt did not return to PT following injection   Remaining deficits: Unknown as pt did not return   Education / Equipment: HEP  Plan: Patient agrees to discharge.  Patient goals were not met. Patient is being discharged due to not returning since the last visit.  ?????    Clarita Crane, PT, DPT 05/22/2015 9:18 AM  [facility] [address] [place], [place] [address]  [phone] (office) [phone] (fax)

## 2015-04-03 ENCOUNTER — Ambulatory Visit (INDEPENDENT_AMBULATORY_CARE_PROVIDER_SITE_OTHER): Payer: Medicare Other | Admitting: Family Medicine

## 2015-04-03 DIAGNOSIS — M25511 Pain in right shoulder: Secondary | ICD-10-CM

## 2015-04-03 MED ORDER — HYDROCODONE-ACETAMINOPHEN 5-325 MG PO TABS: 1.0000 | ORAL_TABLET | Freq: Four times a day (QID) | ORAL | Status: DC | PRN

## 2015-04-03 MED ORDER — METHYLPREDNISOLONE ACETATE 40 MG/ML IJ SUSP
40.0000 mg | Freq: Once | INTRAMUSCULAR | Status: AC
  Administered 2015-04-03: 40 mg via INTRA_ARTICULAR

## 2015-04-04 DIAGNOSIS — M25511 Pain in right shoulder: Secondary | ICD-10-CM

## 2015-04-04 HISTORY — DX: Pain in right shoulder: M25.511

## 2015-04-04 NOTE — Progress Notes (Signed)
PCP: Marga Melnick, MD  Subjective:   HPI: Patient is a 69 y.o. female here for right shoulder pain.  4/27: Patient reports she's had 3 weeks of worsening right shoulder pain. No known injury or increase in activity level. Pain can radiate down the arm and up to neck. Pain 7/10 at the worst. Worse reaching behind her. + night pain. Tried ibuprofen.  5/26: Patient returns today for subacromial injection. Has been doing PT but not improving as expected.    Past Medical History  Diagnosis Date  . Hyperlipidemia   . Allergy   . Diabetes mellitus   . Asthma   . Atypical seizure 06/07/2012  . DVT (deep venous thrombosis)   . Olfactory aura   . Shingles 07/2013  . Headache(784.0)   . Hypersomnia with sleep apnea, unspecified 05/22/2014  . OSA on CPAP   . Anxiety     Dr Madaline Guthrie  . Depression     Dr Madaline Guthrie    Current Outpatient Prescriptions on File Prior to Visit  Medication Sig Dispense Refill  . buPROPion (WELLBUTRIN XL) 150 MG 24 hr tablet Take 150 mg by mouth daily.    Marland Kitchen ibuprofen (ADVIL,MOTRIN) 200 MG tablet Take 200 mg by mouth every 6 (six) hours as needed.    . LamoTRIgine (LAMICTAL XR) 100 MG TB24 Take 100 mg by mouth daily.     Marland Kitchen loratadine (CLARITIN) 10 MG tablet Take 10 mg by mouth daily.      . metFORMIN (GLUCOPHAGE-XR) 750 MG 24 hr tablet 1 tablet daily.    . sertraline (ZOLOFT) 50 MG tablet Take 75 mg by mouth daily.      No current facility-administered medications on file prior to visit.    Past Surgical History  Procedure Laterality Date  . Appendectomy    . Tonsillectomy     . Cholecystectomy    . Colonoscopy  2006    [facility]    Allergies  Allergen Reactions  . Mushroom Ext Cmplx(Shiitake-Reishi-Mait) Anaphylaxis  . Shellfish Allergy Anaphylaxis  . Sulfonamide Derivatives Anaphylaxis    unknown  . Acetaminophen     REACTION: STOMACH DISCOMFORT  . Butalbital-Aspirin-Caffeine     unknown  . Clarithromycin Nausea And Vomiting  .  Fluticasone-Salmeterol     Feels like something in the throat  . Montelukast Sodium     unknown  . Moxifloxacin     unknown  . Orphenadrine Citrate     unknown  . Penicillins     unknown    History   Social History  . Marital Status: Married    Spouse Name: Lars Mage  . Number of Children: 2  . Years of Education: College   Occupational History  . Not on file.   Social History Main Topics  . Smoking status: Never Smoker   . Smokeless tobacco: Never Used  . Alcohol Use: 0.0 oz/week    0 Standard drinks or equivalent per week     Comment: occ  . Drug Use: No  . Sexual Activity: Not on file   Other Topics Concern  . Not on file   Social History Narrative   Patient is married Lars Mage) and lives at home with her husband.   Patient has two adult children.   Patient is retired.   Patient has a college education.   Patient is right-handed.   Patient drinks one cup of tea daily.    Family History  Problem Relation Age of Onset  . Stomach cancer Father   .  Coronary artery disease Father   . Breast cancer Sister     breast cancer  . Diabetes Maternal Aunt   . Diabetes Paternal Aunt   . Stroke Neg Hx   . Prostate cancer Brother     There were no vitals taken for this visit.  Review of Systems: See HPI above.    Objective:  Physical Exam:  Gen: NAD Exam not repeated today. Right shoulder: No swelling, ecchymoses.  No gross deformity. TTP anterior shoulder joint. FROM but with painful arc. Positive Hawkins, Neers. Negative Yergasons. Strength 5/5 with empty can and resisted internal/external rotation.  Pain empty can and ER. Negative apprehension. NV intact distally.    Assessment & Plan:  1. Right shoulder pain - 2/2 rotator cuff impingement.  Injection given today.  Can resume PT in 5-7 days.  Call us in 1-2 weeks to let us know how she's doing.  Consider further imaging if not improving.  After informed written consent, patient was seated on exam table.  Right shoulder was prepped with alcohol swab and utilizing posterior approach, patient's right subacromial space was injected with 3:1 marcaine: depomedrol. Patient tolerated the procedure well without immediate complications.

## 2015-04-04 NOTE — Assessment & Plan Note (Signed)
2/2 rotator cuff impingement.  Injection given today.  Can resume PT in 5-7 days.  Call us in 1-2 weeks to let us know how she's doing.  Consider further imaging if not improving.  After informed written consent, patient was seated on exam table. Right shoulder was prepped with alcohol swab and utilizing posterior approach, patient's right subacromial space was injected with 3:1 marcaine: depomedrol. Patient tolerated the procedure well without immediate complications.

## 2015-04-11 ENCOUNTER — Telehealth: Payer: Self-pay | Admitting: Family Medicine

## 2015-04-11 NOTE — Telephone Encounter (Signed)
Great to hear. Thanks.  

## 2015-04-15 ENCOUNTER — Ambulatory Visit (INDEPENDENT_AMBULATORY_CARE_PROVIDER_SITE_OTHER): Payer: Medicare Other | Admitting: Neurology

## 2015-04-15 ENCOUNTER — Ambulatory Visit: Payer: Medicare Other | Admitting: Neurology

## 2015-04-15 ENCOUNTER — Encounter: Payer: Self-pay | Admitting: Neurology

## 2015-04-15 VITALS — BP 120/64 | HR 80 | Resp 20 | Ht 60.24 in | Wt 252.0 lb

## 2015-04-15 DIAGNOSIS — G4733 Obstructive sleep apnea (adult) (pediatric): Secondary | ICD-10-CM

## 2015-04-15 DIAGNOSIS — Z79899 Other long term (current) drug therapy: Secondary | ICD-10-CM | POA: Diagnosis not present

## 2015-04-15 DIAGNOSIS — Z9989 Dependence on other enabling machines and devices: Secondary | ICD-10-CM

## 2015-04-15 MED ORDER — LAMOTRIGINE ER 100 MG PO TB24: 100.0000 mg | ORAL_TABLET | Freq: Every day | ORAL | Status: DC

## 2015-04-15 NOTE — Progress Notes (Signed)
HPI: Nicole Mccann, 69 -year-old pleasant [place] lady returns for followup.   She had more falls, but none associated with a seizure or olfactory aura.   Continues medication -  She was so dizzy she felt she fall , but she has less headaches and is less fatigued and sleepy on CPAP. She is highly compliant.  The patient is a 97% compliance on her last download 29 out of 30 days was 5 hours and 45 minutes of average use per night she is on an auto set 4-12 cm water pressure with 3 cm EPR her residual AHI is 1.7 which is an excellent result Epworth remains at 10 points fatigue at only 20 points.  06-28-12  Patient was seen in the EMU at [facility] and diagnosed with an olfactory aura, no EEG abnormalities. 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  The Keppra was well tolerated. She has only once a week any aura, not daily.  November 21, 2012. Patient developed depression, possibly worseing on Keppra which we weaned off. She is no longer taking Coumadin for the presumed upper extremity DVT (Dr. Azucena Kuba, [facility])  perhaps rather a thrombophlebitis. She was changed to Lamictal and was doing well on 100 mg XR daily in the AM  after a slow titration.  She will progress to 100 mg now.   05/21/13- followup visit today and patient has had 2 auras  since last seen. Her Lamictal dose is currently at 150 daily instead of 200 as Dr. Vickey Huger had ordered.  Apparently she did not understand that she was supposed to continue to titrate to 200. She denies any side effects to the medication. He has no new neurologic complaints. She is currently receiving lumbar epidural steroids by Dr. Arta Bruce for her low back pain.  05-22-14 - psychaitrist recommend Wellbutrin, but changed to Zoloft after seizure concerns were discussed. She has better mood on Lamictal, was sad on Keppra.  She was hospitalized in [place] , August 28 2013, after a seizure or syncope on a cruise. Her husband fainted  and needed hospital admission , too.  Both [place] spend their [employer] in a hospital in [place]. Her husband is concerned about her loud snoring, she is obese and has been very fatigued. She denies waking up with headaches. Both have witnessed each others apneas.     ROS:  Weight gain, easy bruising, allergies, snoring  Physical Exam General: well developed, well nourished, seated, in no evident distress Head: head normocephalic and atraumatic. Oropharynx benign Neck: supple with no carotid  bruits Cardiovascular: regular rate and rhythm, no murmurs  Neurologic Exam Mental Status: Awake and fully alert. Oriented to place and time. Follows all commands. Speech and language normal.   Cranial Nerves: Fundoscopic exam without evidence of papilledema . Pupils equal, briskly reactive to light.  Extraocular movements full with saccades , not  nystagmus. Visual fields - she reports trouble seeing the floor .  Hearing intact and symmetric to finger snap. Facial sensation intact. Face, tongue, palate move normally and symmetrically.  Neck flexion and extension normal.  Motor: Normal bulk and tone. Normal strength in all tested extremity muscles.No focal weakness, but reports foot drop when her sugar is low !   Sensory : loss of vibration in toes.  Coordination: Rapid alternating movements normal in all extremities.  Finger-to-nose  performed accurately bilaterally. Gait and Station: Arises from chair  without difficulty. Stance is wide-based, ambulates with a single-point cane   Reflexes: 2+ and symmetric. Toes downgoing.     ASSESSMENT:   1)Seizures with Olfactory aura which have responded to Lamictal, no longer having Auras . 2)OSA on CPAP: she had a pressure adjustment and has no longer headaches, the patient is currently using an auto CPAP between 4 and 12 cm water allowing for a residual AHI of only 3.1.  Her compliance was 100% for 30 days with 5 hours and 11 minutes of nightly use. The EPR level 3 cm water she does have a moderate air leak. She feels that after 4 hours she wakes up with a sudden higher pressure, perhaps due to leak? 3) obesity - unchanged,  4) diabetes poorly controlled- and may cause falls , too.  5) depression treated with Zoloft by Dr. Madaline Guthrie . 6) gait disorder, uses a cane, last fall was 6 weeks ago- broke her  Little finger on the right hand.       PLAN: Lamictal to 100 mg XR  1 tab daily,  need refills. Yearly CPAP revisit , will  schedule with her husband same day .  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  Fall prevention information given and urged to consider PT . Followup yearly and when necessary

## 2015-04-17 ENCOUNTER — Ambulatory Visit: Payer: Medicare Other | Admitting: Family Medicine

## 2015-06-30 ENCOUNTER — Ambulatory Visit (HOSPITAL_BASED_OUTPATIENT_CLINIC_OR_DEPARTMENT_OTHER)
Admission: RE | Admit: 2015-06-30 | Discharge: 2015-06-30 | Disposition: A | Discharge status: Home / Self Care | Payer: Medicare Other | Source: Ambulatory Visit | Attending: Family Medicine | Admitting: Family Medicine

## 2015-06-30 ENCOUNTER — Encounter: Payer: Self-pay | Admitting: Family Medicine

## 2015-06-30 ENCOUNTER — Ambulatory Visit (INDEPENDENT_AMBULATORY_CARE_PROVIDER_SITE_OTHER): Payer: Medicare Other | Admitting: Family Medicine

## 2015-06-30 VITALS — BP 105/71 | HR 76 | Ht 60.0 in | Wt 250.0 lb

## 2015-06-30 DIAGNOSIS — M25511 Pain in right shoulder: Secondary | ICD-10-CM | POA: Insufficient documentation

## 2015-07-01 NOTE — Progress Notes (Addendum)
PCP: Marga Melnick, MD  Subjective:   HPI: Patient is a 69 y.o. female here for right shoulder pain.  4/27: Patient reports she's had 3 weeks of worsening right shoulder pain. No known injury or increase in activity level. Pain can radiate down the arm and up to neck. Pain 7/10 at the worst. Worse reaching behind her. + night pain. Tried ibuprofen.  5/26: Patient returns today for subacromial injection. Has been doing PT but not improving as expected.    8/22: Patient reports right shoulder continues to have severe pain. Up to 8/10 despite injection, PT, home exercises, salon pas. Would like to go ahead with MRI.  Past Medical History  Diagnosis Date  . Hyperlipidemia   . Allergy   . Diabetes mellitus   . Asthma   . Atypical seizure 06/07/2012  . DVT (deep venous thrombosis)   . Olfactory aura   . Shingles 07/2013  . Headache(784.0)   . Hypersomnia with sleep apnea, unspecified 05/22/2014  . OSA on CPAP   . Anxiety     Dr Madaline Guthrie  . Depression     Dr Madaline Guthrie    Current Outpatient Prescriptions on File Prior to Visit  Medication Sig Dispense Refill  . buPROPion (WELLBUTRIN XL) 150 MG 24 hr tablet Take 150 mg by mouth daily.    Marland Kitchen ibuprofen (ADVIL,MOTRIN) 200 MG tablet Take 200 mg by mouth every 6 (six) hours as needed.    . LamoTRIgine (LAMICTAL XR) 100 MG TB24 Take 1 tablet (100 mg total) by mouth daily. 30 tablet 11  . loratadine (CLARITIN) 10 MG tablet Take 10 mg by mouth daily.      . metFORMIN (GLUCOPHAGE-XR) 750 MG 24 hr tablet 1 tablet daily.    . sertraline (ZOLOFT) 50 MG tablet Take 75 mg by mouth daily.      No current facility-administered medications on file prior to visit.    Past Surgical History  Procedure Laterality Date  . Appendectomy    . Tonsillectomy     . Cholecystectomy    . Colonoscopy  2006    [facility]    Allergies  Allergen Reactions  . Mushroom Ext Cmplx(Shiitake-Reishi-Mait) Anaphylaxis  . Shellfish Allergy Anaphylaxis  .  Sulfonamide Derivatives Anaphylaxis    unknown  . Acetaminophen     REACTION: STOMACH DISCOMFORT  . Butalbital-Aspirin-Caffeine     unknown  . Clarithromycin Nausea And Vomiting  . Fluticasone-Salmeterol     Feels like something in the throat  . Montelukast Sodium     unknown  . Moxifloxacin     unknown  . Orphenadrine Citrate     unknown  . Penicillins     unknown    Social History   Social History  . Marital Status: Married    Spouse Name: Lars Mage  . Number of Children: 2  . Years of Education: College   Occupational History  . Not on file.   Social History Main Topics  . Smoking status: Never Smoker   . Smokeless tobacco: Never Used  . Alcohol Use: 0.0 oz/week    0 Standard drinks or equivalent per week     Comment: occ  . Drug Use: No  . Sexual Activity: Not on file   Other Topics Concern  . Not on file   Social History Narrative   Patient is married Lars Mage) and lives at home with her husband.   Patient has two adult children.   Patient is retired.   Patient has a college  education.   Patient is right-handed.   Patient drinks one cup of tea daily.    Family History  Problem Relation Age of Onset  . Stomach cancer Father   . Coronary artery disease Father   . Breast cancer Sister     breast cancer  . Diabetes Maternal Aunt   . Diabetes Paternal Aunt   . Stroke Neg Hx   . Prostate cancer Brother     BP 105/71 mmHg  Pulse 76  Ht 5' (1.524 m)  Wt 250 lb (113.399 kg)  BMI 48.82 kg/m2  Review of Systems: See HPI above.    Objective:  Physical Exam:  Gen: NAD  Right shoulder: No swelling, ecchymoses.  No gross deformity. TTP anterior shoulder joint. FROM with painful arc. Positive Hawkins, Neers. Negative Yergasons. Strength 5/5 with empty can and resisted internal/external rotation.  Pain empty can and ER, IR. Negative apprehension. NV intact distally.    Assessment & Plan:  1. Right shoulder pain - 2/2 rotator cuff impingement.   Unfortunately not improving with PT, injection, home exercise program.  Will go ahead with MRI to further assess at this point.    Addendum:  MRI reviewed and discussed with patient.  She does have a partial width full thickness supraspinatus tear but she also has underlying DJD of glenohumeral joint, moderate tendinopathy.  Her pain has been present for over 5 months now not improving with conservative measures.  Will go ahead with ortho referral for their input re: arthroscopy vs more extensive physical therapy.

## 2015-07-01 NOTE — Assessment & Plan Note (Signed)
2/2 rotator cuff impingement.  Unfortunately not improving with PT, injection, home exercise program.  Will go ahead with MRI to further assess at this point.

## 2015-07-12 ENCOUNTER — Ambulatory Visit (HOSPITAL_BASED_OUTPATIENT_CLINIC_OR_DEPARTMENT_OTHER)
Admission: RE | Admit: 2015-07-12 | Discharge: 2015-07-12 | Disposition: A | Discharge status: Home / Self Care | Payer: Medicare Other | Source: Ambulatory Visit | Attending: Family Medicine | Admitting: Family Medicine

## 2015-07-12 DIAGNOSIS — M25511 Pain in right shoulder: Secondary | ICD-10-CM | POA: Diagnosis present

## 2015-07-12 DIAGNOSIS — M75111 Incomplete rotator cuff tear or rupture of right shoulder, not specified as traumatic: Secondary | ICD-10-CM | POA: Insufficient documentation

## 2015-07-12 DIAGNOSIS — M19011 Primary osteoarthritis, right shoulder: Secondary | ICD-10-CM | POA: Diagnosis not present

## 2015-07-15 NOTE — Addendum Note (Signed)
Addended by: Lenda Kelp on: 07/15/2015 03:43 PM   Modules accepted: Kipp Brood

## 2015-07-16 NOTE — Addendum Note (Signed)
Addended by: Kathi Simpers F on: 07/16/2015 11:54 AM   Modules accepted: Orders

## 2015-07-23 ENCOUNTER — Ambulatory Visit (INDEPENDENT_AMBULATORY_CARE_PROVIDER_SITE_OTHER): Payer: Medicare Other | Admitting: Internal Medicine

## 2015-07-23 ENCOUNTER — Encounter: Payer: Self-pay | Admitting: Internal Medicine

## 2015-07-23 VITALS — BP 124/72 | HR 75 | Temp 98.4°F | Resp 16 | Ht 60.0 in | Wt 254.0 lb

## 2015-07-23 DIAGNOSIS — Z23 Encounter for immunization: Secondary | ICD-10-CM

## 2015-07-23 DIAGNOSIS — M7541 Impingement syndrome of right shoulder: Secondary | ICD-10-CM | POA: Diagnosis not present

## 2015-07-23 NOTE — Patient Instructions (Signed)
Please do the mental exercise we discussed over 5 days and spiritual consideration for 24 hours. Whatever you decide will be the correct decision.

## 2015-07-23 NOTE — Progress Notes (Signed)
   Subjective:    Patient ID: Nicole Mccann, female    DOB: 1946/09/17, 69 y.o.   MRN: [id number]  HPI   She is here to discuss having surgery on her right shoulder by Dr.Landau,Orthopedist.  She actually injured her right shoulder in January this year lifting and moving flowerpots. She saw Dr. Corrin Parker Medicine specialist in April & was diagnosed with  rotator cuff impingement. She had 3 sessions of physical therapy without benefit. She also had a steroid injection which provided relief for only a few days. Subsequent to that she began to be awakened by the pain again. On average she was taking ibuprofen once a week with partial relief.  X-rays and MRIs prompted referral to the orthopedist who diagnosed a ligament/tendon detachment.  Surgeries is tentatively planned in 6 weeks. She would be immobilized in reference to the right extremity for 6 weeks postop.   Review of Systems  She's had no associated fever, chills, sweats, weight loss.  There's been no rash or change in the color or temperature of the skin in the area of the pain.  She has no numbness, tingling, weakness in the right upper extremity.     Objective:   Physical Exam Pertinent or positive findings include: She is unable to elevate the right upper extremity above the shoulder level. She has decreased posterior range of motion the right upper extremity. Stasis dermatitis changes are noted of the lower shins.  General appearance :adequately nourished; in no distress. BMI 49.61.  Eyes: No conjunctival inflammation or scleral icterus is present.  Heart:  Normal rate and regular rhythm. S1 and S2 normal without gallop, murmur, click, rub or other extra sounds    Lungs:Chest clear to auscultation; no wheezes, rhonchi,rales ,or rubs present.No increased work of breathing. BS decreased.  Abdomen: bowel sounds normal, soft and non-tender without masses, organomegaly or hernias noted.  No guarding or rebound.    Vascular : all pulses equal ; no bruits present.  Skin:Warm & dry.  Intact without suspicious lesions or rashes ; no tenting or jaundice   Lymphatic: No lymphadenopathy is noted about the head, neck, axilla   Neuro: Strength, tone & DTRs normal in RUE.     Assessment & Plan:  #1 right shoulder impingement syndrome; the pros and cons of the surgery were discussed.  Plan: See AVS Flu shot

## 2015-07-23 NOTE — Progress Notes (Signed)
Pre visit review using our clinic review tool, if applicable. No additional management support is needed unless otherwise documented below in the visit note. 

## 2015-07-27 ENCOUNTER — Emergency Department (HOSPITAL_BASED_OUTPATIENT_CLINIC_OR_DEPARTMENT_OTHER)
Admission: EM | Admit: 2015-07-27 | Discharge: 2015-07-27 | Disposition: A | Discharge status: Home / Self Care | Payer: Medicare Other | Attending: Emergency Medicine | Admitting: Emergency Medicine

## 2015-07-27 ENCOUNTER — Encounter (HOSPITAL_BASED_OUTPATIENT_CLINIC_OR_DEPARTMENT_OTHER): Payer: Self-pay | Admitting: Emergency Medicine

## 2015-07-27 DIAGNOSIS — F419 Anxiety disorder, unspecified: Secondary | ICD-10-CM | POA: Diagnosis not present

## 2015-07-27 DIAGNOSIS — Z86718 Personal history of other venous thrombosis and embolism: Secondary | ICD-10-CM | POA: Insufficient documentation

## 2015-07-27 DIAGNOSIS — F329 Major depressive disorder, single episode, unspecified: Secondary | ICD-10-CM | POA: Diagnosis not present

## 2015-07-27 DIAGNOSIS — E119 Type 2 diabetes mellitus without complications: Secondary | ICD-10-CM | POA: Insufficient documentation

## 2015-07-27 DIAGNOSIS — J45909 Unspecified asthma, uncomplicated: Secondary | ICD-10-CM | POA: Insufficient documentation

## 2015-07-27 DIAGNOSIS — L03211 Cellulitis of face: Secondary | ICD-10-CM | POA: Diagnosis not present

## 2015-07-27 DIAGNOSIS — Z8619 Personal history of other infectious and parasitic diseases: Secondary | ICD-10-CM | POA: Insufficient documentation

## 2015-07-27 DIAGNOSIS — G40909 Epilepsy, unspecified, not intractable, without status epilepticus: Secondary | ICD-10-CM | POA: Insufficient documentation

## 2015-07-27 DIAGNOSIS — L259 Unspecified contact dermatitis, unspecified cause: Secondary | ICD-10-CM | POA: Insufficient documentation

## 2015-07-27 DIAGNOSIS — Z880 Allergy status to penicillin: Secondary | ICD-10-CM | POA: Diagnosis not present

## 2015-07-27 DIAGNOSIS — Z79899 Other long term (current) drug therapy: Secondary | ICD-10-CM | POA: Insufficient documentation

## 2015-07-27 DIAGNOSIS — R21 Rash and other nonspecific skin eruption: Secondary | ICD-10-CM

## 2015-07-27 DIAGNOSIS — G4733 Obstructive sleep apnea (adult) (pediatric): Secondary | ICD-10-CM | POA: Insufficient documentation

## 2015-07-27 MED ORDER — CLINDAMYCIN HCL 150 MG PO CAPS: 450.0000 mg | ORAL_CAPSULE | Freq: Three times a day (TID) | ORAL | Status: DC

## 2015-07-27 MED ORDER — PREDNISONE 20 MG PO TABS: ORAL_TABLET | ORAL | Status: DC

## 2015-07-27 NOTE — ED Notes (Signed)
MD at bedside. 

## 2015-07-27 NOTE — ED Provider Notes (Signed)
CSN: [id number]     Arrival date & time 07/27/15  1436 History  This chart was scribed for Elwin Mocha, MD by Octavia Heir, ED Scribe. This patient was seen in room MH01/MH01 and the patient's care was started at 3:01 PM.    Chief Complaint  Patient presents with  . Rash     Patient is a 69 y.o. female presenting with rash. The history is provided by the patient. No language interpreter was used.  Rash Location:  Face Facial rash location:  L cheek Quality: itchiness, redness and swelling   Severity:  Moderate Onset quality:  Sudden Duration:  3 days Timing:  Rare Progression:  Worsening Chronicity:  New Relieved by:  Nothing Associated symptoms: no fever    HPI Comments: Nicole Mccann is a 69 y.o. female who presents to the Emergency Department complaining of a sudden onset, gradual worsening lower left facial rash onset 3 days ago. Pt has associated facial swelling, itching, and pain to palpation on the lower left side of her face. Pt notes she received the flu shot last week and states the rash started itching afterwards. She states she went to dinner and picked up an unknown plant to smell it but reports she did not let it touch her face. Pt notes she used an ice pack to relieve the itch with minimal relief. Pt also notes putting some abreva to eliminate the itch. Pt denies vision changes, pain, and throbbing, fever.   Past Medical History  Diagnosis Date  . Hyperlipidemia   . Allergy   . Diabetes mellitus   . Asthma   . Atypical seizure 06/07/2012  . DVT (deep venous thrombosis)   . Olfactory aura   . Shingles 07/2013  . Headache(784.0)   . Hypersomnia with sleep apnea, unspecified 05/22/2014  . OSA on CPAP   . Anxiety     Dr Madaline Guthrie  . Depression     Dr Madaline Guthrie   Past Surgical History  Procedure Laterality Date  . Appendectomy    . Tonsillectomy     . Cholecystectomy    . Colonoscopy  2006    [facility]   Family History  Problem Relation Age of Onset  .  Stomach cancer Father   . Coronary artery disease Father   . Breast cancer Sister     breast cancer  . Diabetes Maternal Aunt   . Diabetes Paternal Aunt   . Stroke Neg Hx   . Prostate cancer Brother    Social History  Substance Use Topics  . Smoking status: Never Smoker   . Smokeless tobacco: Never Used  . Alcohol Use: 0.0 oz/week    0 Standard drinks or equivalent per week     Comment: occ   OB History    No data available     Review of Systems  Constitutional: Negative for fever.  Skin: Positive for rash.  All other systems reviewed and are negative.     Allergies  Mushroom ext cmplx(shiitake-reishi-mait); Shellfish allergy; Sulfonamide derivatives; Acetaminophen; Butalbital-aspirin-caffeine; Clarithromycin; Fluticasone-salmeterol; Montelukast sodium; Moxifloxacin; Orphenadrine citrate; and Penicillins  Home Medications   Prior to Admission medications   Medication Sig Start Date End Date Taking? Authorizing Provider  buPROPion (WELLBUTRIN XL) 150 MG 24 hr tablet Take 150 mg by mouth daily.   Yes Historical Provider, MD  Docosanol (ABREVA EX) Apply topically.   Yes Historical Provider, MD  ibuprofen (ADVIL,MOTRIN) 200 MG tablet Take 200 mg by mouth every 6 (six) hours as needed.  Yes Historical Provider, MD  loratadine (CLARITIN) 10 MG tablet Take 10 mg by mouth daily.     Yes Historical Provider, MD  metFORMIN (GLUCOPHAGE-XR) 750 MG 24 hr tablet 1 tablet daily. 05/01/14  Yes Historical Provider, MD  sertraline (ZOLOFT) 50 MG tablet Take 75 mg by mouth daily.    Yes Historical Provider, MD  LamoTRIgine (LAMICTAL XR) 100 MG TB24 Take 1 tablet (100 mg total) by mouth daily. 04/15/15   Porfirio Mylar Dohmeier, MD   Triage vitals: BP 131/53 mmHg  Pulse 82  Temp(Src) 98.4 F (36.9 C) (Oral)  Resp 18  Ht 5\' 1"  (1.549 m)  Wt 250 lb (113.399 kg)  BMI 47.26 kg/m2  SpO2 100% Physical Exam  Constitutional: She is oriented to person, place, and time. She appears well-developed and  well-nourished. No distress.  HENT:  Head: Normocephalic and atraumatic.    Mouth/Throat: Oropharynx is clear and moist.  Rash in area delineated. Erythematous with some vesicles are superior portion.  Swollen, drooping, and induration at inferior portion.   Eyes: EOM are normal. Pupils are equal, round, and reactive to light.  Neck: Normal range of motion. Neck supple.  Cardiovascular: Normal rate and regular rhythm.  Exam reveals no friction rub.   No murmur heard. Pulmonary/Chest: Effort normal and breath sounds normal. No respiratory distress. She has no wheezes. She has no rales.  Abdominal: Soft. She exhibits no distension. There is no tenderness. There is no rebound.  Musculoskeletal: Normal range of motion. She exhibits no edema.  Neurological: She is alert and oriented to person, place, and time.  Skin: She is not diaphoretic.  Nursing note and vitals reviewed.   ED Course  Procedures  DIAGNOSTIC STUDIES: Oxygen Saturation is 100% on RA, normal by my interpretation.  COORDINATION OF CARE:  3:06 PM Discussed treatment plan which includes steroids and antibiotics with pt at bedside and pt agreed to plan.  Labs Review Labs Reviewed - No data to display  Imaging Review No results found. I have personally reviewed and evaluated these images and lab results as part of my medical decision-making.   EKG Interpretation None      MDM   Final diagnoses:  Facial rash  Cellulitis of face  Contact dermatitis    70F here with rash on her face. Began 3 days ago. Denies any systemic symptoms. Denies any poison ivy contact, however did pick up a new plant that she thought was mint and immediately put it down. Rash has some vesicles on speed a portion but is indurated and erythematous on inferior regions. Will treat for contact dermatitis with steroids and for cellulitis with clindamycin. She is going to see her PCP tomorrow.   I personally performed the services described  in this documentation, which was scribed in my presence. The recorded information has been reviewed and is accurate.     Elwin Mocha, MD 07/27/15 1540

## 2015-07-27 NOTE — ED Notes (Signed)
Presents with rash on left cheek and lips, onset 3 days ago, states it itches a lot and unable to wear CPAP mask, due to sensitivity and burning type discomfort. Denies any redness or pain in mouth, no c/o SOB or dyspnea

## 2015-07-27 NOTE — Discharge Instructions (Signed)
Cellulitis °Cellulitis is an infection of the skin and the tissue beneath it. The infected area is usually red and tender. Cellulitis occurs most often in the arms and lower legs.  °CAUSES  °Cellulitis is caused by bacteria that enter the skin through cracks or cuts in the skin. The most common types of bacteria that cause cellulitis are staphylococci and streptococci. °SIGNS AND SYMPTOMS  °· Redness and warmth. °· Swelling. °· Tenderness or pain. °· Fever. °DIAGNOSIS  °Your health care provider can usually determine what is wrong based on a physical exam. Blood tests may also be done. °TREATMENT  °Treatment usually involves taking an antibiotic medicine. °HOME CARE INSTRUCTIONS  °· Take your antibiotic medicine as directed by your health care provider. Finish the antibiotic even if you start to feel better. °· Keep the infected arm or leg elevated to reduce swelling. °· Apply a warm cloth to the affected area up to 4 times per day to relieve pain. °· Take medicines only as directed by your health care provider. °· Keep all follow-up visits as directed by your health care provider. °SEEK MEDICAL CARE IF:  °· You notice red streaks coming from the infected area. °· Your red area gets larger or turns dark in color. °· Your bone or joint underneath the infected area becomes painful after the skin has healed. °· Your infection returns in the same area or another area. °· You notice a swollen bump in the infected area. °· You develop new symptoms. °· You have a fever. °SEEK IMMEDIATE MEDICAL CARE IF:  °· You feel very sleepy. °· You develop vomiting or diarrhea. °· You have a general ill feeling (malaise) with muscle aches and pains. °MAKE SURE YOU:  °· Understand these instructions. °· Will watch your condition. °· Will get help right away if you are not doing well or get worse. °Document Released: 08/04/2005 Document Revised: 03/11/2014 Document Reviewed: 01/10/2012 °ExitCare® Patient Information ©2015 [employer].  This information is not intended to replace advice given to you by your health care provider. Make sure you discuss any questions you have with your health care provider. ° °Contact Dermatitis °Contact dermatitis is a reaction to certain substances that touch the skin. Contact dermatitis can be either irritant contact dermatitis or allergic contact dermatitis. Irritant contact dermatitis does not require previous exposure to the substance for a reaction to occur. Allergic contact dermatitis only occurs if you have been exposed to the substance before. Upon a repeat exposure, your body reacts to the substance.  °CAUSES  °Many substances can cause contact dermatitis. Irritant dermatitis is most commonly caused by repeated exposure to mildly irritating substances, such as: °· Makeup. °· Soaps. °· Detergents. °· Bleaches. °· Acids. °· Metal salts, such as nickel. °Allergic contact dermatitis is most commonly caused by exposure to: °· Poisonous plants. °· Chemicals (deodorants, shampoos). °· Jewelry. °· Latex. °· Neomycin in triple antibiotic cream. °· Preservatives in products, including clothing. °SYMPTOMS  °The area of skin that is exposed may develop: °· Dryness or flaking. °· Redness. °· Cracks. °· Itching. °· Pain or a burning sensation. °· Blisters. °With allergic contact dermatitis, there may also be swelling in areas such as the eyelids, mouth, or genitals.  °DIAGNOSIS  °Your caregiver can usually tell what the problem is by doing a physical exam. In cases where the cause is uncertain and an allergic contact dermatitis is suspected, a patch skin test may be performed to help determine the cause of your dermatitis. °TREATMENT °Treatment includes   protecting the skin from further contact with the irritating substance by avoiding that substance if possible. Barrier creams, powders, and gloves may be helpful. Your caregiver may also recommend: °· Steroid creams or ointments applied 2 times daily. For best results,  soak the rash area in cool water for 20 minutes. Then apply the medicine. Cover the area with a plastic wrap. You can store the steroid cream in the refrigerator for a "chilly" effect on your rash. That may decrease itching. Oral steroid medicines may be needed in more severe cases. °· Antibiotics or antibacterial ointments if a skin infection is present. °· Antihistamine lotion or an antihistamine taken by mouth to ease itching. °· Lubricants to keep moisture in your skin. °· Burow's solution to reduce redness and soreness or to dry a weeping rash. Mix one packet or tablet of solution in 2 cups cool water. Dip a clean washcloth in the mixture, wring it out a bit, and put it on the affected area. Leave the cloth in place for 30 minutes. Do this as often as possible throughout the day. °· Taking several cornstarch or baking soda baths daily if the area is too large to cover with a washcloth. °Harsh chemicals, such as alkalis or acids, can cause skin damage that is like a burn. You should flush your skin for 15 to 20 minutes with cold water after such an exposure. You should also seek immediate medical care after exposure. Bandages (dressings), antibiotics, and pain medicine may be needed for severely irritated skin.  °HOME CARE INSTRUCTIONS °· Avoid the substance that caused your reaction. °· Keep the area of skin that is affected away from hot water, soap, sunlight, chemicals, acidic substances, or anything else that would irritate your skin. °· Do not scratch the rash. Scratching may cause the rash to become infected. °· You may take cool baths to help stop the itching. °· Only take over-the-counter or prescription medicines as directed by your caregiver. °· See your caregiver for follow-up care as directed to make sure your skin is healing properly. °SEEK MEDICAL CARE IF:  °· Your condition is not better after 3 days of treatment. °· You seem to be getting worse. °· You see signs of infection such as swelling,  tenderness, redness, soreness, or warmth in the affected area. °· You have any problems related to your medicines. °Document Released: 10/22/2000 Document Revised: 01/17/2012 Document Reviewed: 03/30/2011 °ExitCare® Patient Information ©2015 [employer]. This information is not intended to replace advice given to you by your health care provider. Make sure you discuss any questions you have with your health care provider. ° °

## 2015-07-27 NOTE — ED Notes (Signed)
Reports rash to left side of lower face x 3 days

## 2015-07-28 ENCOUNTER — Encounter: Payer: Self-pay | Admitting: Internal Medicine

## 2015-07-28 ENCOUNTER — Ambulatory Visit (INDEPENDENT_AMBULATORY_CARE_PROVIDER_SITE_OTHER): Payer: Medicare Other | Admitting: Internal Medicine

## 2015-07-28 VITALS — BP 132/70 | HR 68 | Temp 98.1°F | Resp 18 | Wt 252.0 lb

## 2015-07-28 DIAGNOSIS — L259 Unspecified contact dermatitis, unspecified cause: Secondary | ICD-10-CM | POA: Diagnosis not present

## 2015-07-28 MED ORDER — ALBUTEROL SULFATE HFA 108 (90 BASE) MCG/ACT IN AERS
2.0000 | INHALATION_SPRAY | Freq: Four times a day (QID) | RESPIRATORY_TRACT | Status: DC | PRN
Start: 2015-07-28 — End: 2016-05-05

## 2015-07-28 NOTE — Progress Notes (Signed)
Pre visit review using our clinic review tool, if applicable. No additional management support is needed unless otherwise documented below in the visit note. 

## 2015-07-28 NOTE — Progress Notes (Signed)
   Subjective:    Patient ID: Nicole Mccann, female    DOB: 1946/10/25, 69 y.o.   MRN: [id number]  HPI  She developed a left lower facial rash on 07/23/15 associated with facial swelling, itching, and pain to palpation in that area. She did receive the flu shot last week and questions possible relationship of the rash to  this.  She also ingested citrus ( mandarins ) to which she is allergic by history on 9/14 and was exposed to some herbs or spice  That same day without definite direct contact.  Abreva was applied to lip the evening of 9/14 as she gets sores with citrus exposure. After applying this she wore her CPAP.She developed redness and swelling over the left lips and cheek area.She did D/C the CPAP.The next am there was itching , redness , heat & swelling  @ L mouth.  In ER  9/18 Clindamycin and oral steroids were Rxed.She had immediate dyspnea that night after injesting  a salad with lemon dressing.   The rash has improved slightly.   Review of Systems  No associated itchy, watery eyes.  Swelling of the  tongue or intraoral lesions denied.  No  pustules or urticaria noted.  Fever ,chills , or sweats denied.   Diarrhea not present.  No dysuria, pyuria or hematuria.     Objective:   Physical Exam  General appearance:Adequately nourished; no acute distress or increased work of breathing is present.    Lymphatic: No  lymphadenopathy about the head, neck, or axilla .  Eyes: No conjunctival inflammation or lid edema is present. There is no scleral icterus.  Ears:  External ear exam shows no significant lesions or deformities.  Otoscopic examination reveals clear canals, tympanic membranes are intact bilaterally without bulging, retraction, inflammation or discharge.  Nose:  External nasal examination shows no deformity or inflammation. Nasal mucosa are pink and moist without lesions or exudates No septal dislocation or deviation.No obstruction to airflow.   Oral  exam: Dental hygiene is good; lips and gums are healthy appearing.There is no oropharyngeal erythema or exudate .  Neck:  No deformities, thyromegaly, masses, or tenderness noted.   Supple with full range of motion without pain.   Heart:  Normal rate and regular rhythm. S1 and S2 normal without gallop, murmur, click, rub or other extra sounds.   Lungs:Chest clear to auscultation; no wheezes, rhonchi,rales ,or rubs present.  Extremities:  No cyanosis, edema, or clubbing  noted    Skin: Warm & dry w/o tenting or jaundice., Slightly rough reddened area of irregular dermatitis over the left cheek at the nasolabial fold measuring 55 x 22 mm. Inferior to this is localized edema.No lip or tongue edema.     Assessment & Plan:  #1 contact dermatitis from the occlusive CPAP mask on top of Abreva  Plan see orders recommendations

## 2015-07-28 NOTE — Patient Instructions (Addendum)
  Please use hypoallergenic detergents such as Dreft or [place] Flakes to clean the CPAP of Abreva.  Apply Cort Aid OTC twice a day to the involved tissues. Do not get this topical steroid into eyes.   Use your cell phone camera to monitor  the rash. Take a photo of the skin lesions every day with a small ruler immediately beside the lesion to define any change in size, shape or color.  Continue prednisone until the rash is been gone for 48 hours.  Discontinue the clindamycin.

## 2015-08-01 ENCOUNTER — Other Ambulatory Visit: Payer: Self-pay | Admitting: Emergency Medicine

## 2015-08-13 ENCOUNTER — Other Ambulatory Visit: Payer: Self-pay

## 2015-09-29 ENCOUNTER — Encounter: Payer: Self-pay | Admitting: Family Medicine

## 2015-09-29 ENCOUNTER — Ambulatory Visit (INDEPENDENT_AMBULATORY_CARE_PROVIDER_SITE_OTHER): Payer: Medicare Other | Admitting: Family Medicine

## 2015-09-29 ENCOUNTER — Telehealth: Payer: Self-pay | Admitting: Internal Medicine

## 2015-09-29 VITALS — BP 138/72 | HR 94 | Temp 98.4°F | Wt 252.0 lb

## 2015-09-29 DIAGNOSIS — J329 Chronic sinusitis, unspecified: Secondary | ICD-10-CM

## 2015-09-29 DIAGNOSIS — A499 Bacterial infection, unspecified: Secondary | ICD-10-CM | POA: Diagnosis not present

## 2015-09-29 DIAGNOSIS — B9689 Other specified bacterial agents as the cause of diseases classified elsewhere: Secondary | ICD-10-CM

## 2015-09-29 MED ORDER — AZITHROMYCIN 250 MG PO TABS
ORAL_TABLET | ORAL | Status: DC
Start: 2015-09-29 — End: 2015-10-22

## 2015-09-29 NOTE — Progress Notes (Signed)
PCP: Marga Melnick, MD  Subjective:  Nicole Mccann is a 69 y.o. year old very pleasant female patient who presents with sinusitis symptoms including nasal congestion, mild sore throat, productive cough, R sided maxillary sinus tenderness.  -started: 2 weeks ago - stable for 2 weeks, slightly improved over last day or two -previous treatments:  Mucinex, neti pot. Neti pot seems to help with congestion and sinus pressure -sick contacts/travel/risks: denies flu exposure.   ROS-denies fever, SOB, NVD, tooth pain. Did have some wheeze and feelings that it was "in her chest" over weekend but that has largely improved. No leg swelling  Pertinent Past Medical History- DM followed by [facility], hyperlipidemia, history DVT and PE, severe obesity  Medications- reviewed  Current Outpatient Prescriptions  Medication Sig Dispense Refill  . buPROPion (WELLBUTRIN XL) 150 MG 24 hr tablet Take 150 mg by mouth daily.    . Docosanol (ABREVA EX) Apply topically.    . LamoTRIgine (LAMICTAL XR) 100 MG TB24 Take 1 tablet (100 mg total) by mouth daily. 30 tablet 11  . loratadine (CLARITIN) 10 MG tablet Take 10 mg by mouth daily.      . metFORMIN (GLUCOPHAGE-XR) 750 MG 24 hr tablet 1 tablet daily.    . sertraline (ZOLOFT) 50 MG tablet Take 75 mg by mouth daily.     Marland Kitchen albuterol (PROVENTIL HFA;VENTOLIN HFA) 108 (90 BASE) MCG/ACT inhaler Inhale 2 puffs into the lungs every 6 (six) hours as needed for wheezing or shortness of breath. (Patient not taking: Reported on 09/29/2015) 1 Inhaler 1  . ibuprofen (ADVIL,MOTRIN) 200 MG tablet Take 200 mg by mouth every 6 (six) hours as needed.     Objective: BP 138/72 mmHg  Pulse 94  Temp(Src) 98.4 F (36.9 C)  Wt 252 lb (114.306 kg)  SpO2 96% Gen: NAD, intermittent cough HEENT: Turbinates erythematous, pharynx mildly erythematous with no tonsilar exudate or edema, R maxillary sinus tenderness otherwise no sinus tenderness CV: RRR no murmurs rubs or gallops Lungs:  CTAB no crackles, wheeze, rhonchi Abdomen: soft/nontender/nondistended/normal bowel sounds.  Ext: no edema Skin: warm, dry, no rash Neuro: grossly normal, moves all extremities  Assessment/Plan:  Bacterial Sinusitis Given 2 weeks of cough, productive cough and R sided sinus pressure suspect bacterial sinus infection. Prefer augmentin but PCN allergy. Considered doxycycline. We ultimately opted for azithromycin and follow up for fever, SOB or wheeze, or no improvement in symptoms. Patient will be in [place] so can use urgent care if needed through Sunday. No wheeze or SOB at this time to suggest asthma exacerbation  Meds ordered this encounter  Medications  . azithromycin (ZITHROMAX) 250 MG tablet    Sig: Take 2 tabs on day 1, then 1 tab daily until finished    Dispense:  6 tablet    Refill:  0

## 2015-09-29 NOTE — Telephone Encounter (Signed)
[facility] - Client TELEPHONE ADVICE RECORD [facility]  Patient Name: Nicole Mccann  DOB: May 26, 1946    Initial Comment Caller states wife she is having breathing problems; its been about 4 or 5 days; needs to be seen   Nurse Assessment  Nurse: Laural Benes, RN, Dondra Spry Date/Time Lamount Cohen Time): 09/29/2015 8:48:13 AM  Confirm and document reason for call. If symptomatic, describe symptoms. ---Nicole Mccann has been coughing x 5 days and having trouble breathing. coughing up phlegm  Has the patient traveled out of the country within the last 30 days? ---No  Does the patient have any new or worsening symptoms? ---Yes  Will a triage be completed? ---Yes  Related visit to physician within the last 2 weeks? ---No  Does the PT have any chronic conditions? (i.e. diabetes, asthma, etc.) ---No  Is this a behavioral health call? ---No     Guidelines    Guideline Title Affirmed Question Affirmed Notes  Cough - Acute Productive Wheezing is present    Final Disposition User   See Physician within 4 Hours (or PCP triage) Laural Benes, RN, Dondra Spry    Comments  Note: appointment given at [facility] location with Dr. Jeannett Senior Hunger at 1115am NO APPTS AVAILABLE AT [facility] TODAY going out of town tomorrow. cough with phlegm no fever   Referrals  REFERRED TO PCP OFFICE

## 2015-09-29 NOTE — Patient Instructions (Addendum)
Given 2 weeks of cough, productive cough and R sided sinus pressure suspect bacterial sinus infection  Treat with azithromycin x 5 days. Return to care if symptoms continue into next week or if you get fever or shortness of breath  Great to meet you!

## 2015-10-22 ENCOUNTER — Encounter: Payer: Self-pay | Admitting: Neurology

## 2015-10-22 ENCOUNTER — Ambulatory Visit (INDEPENDENT_AMBULATORY_CARE_PROVIDER_SITE_OTHER): Payer: Medicare Other | Admitting: Neurology

## 2015-10-22 VITALS — BP 118/76 | HR 86 | Resp 20 | Ht 61.0 in | Wt 261.0 lb

## 2015-10-22 DIAGNOSIS — E1310 Other specified diabetes mellitus with ketoacidosis without coma: Secondary | ICD-10-CM | POA: Diagnosis not present

## 2015-10-22 DIAGNOSIS — R431 Parosmia: Secondary | ICD-10-CM

## 2015-10-22 DIAGNOSIS — G4733 Obstructive sleep apnea (adult) (pediatric): Secondary | ICD-10-CM | POA: Diagnosis not present

## 2015-10-22 DIAGNOSIS — E0910 Drug or chemical induced diabetes mellitus with ketoacidosis without coma: Secondary | ICD-10-CM | POA: Diagnosis not present

## 2015-10-22 DIAGNOSIS — E1110 Type 2 diabetes mellitus with ketoacidosis without coma: Secondary | ICD-10-CM

## 2015-10-22 DIAGNOSIS — Z9989 Dependence on other enabling machines and devices: Secondary | ICD-10-CM

## 2015-10-22 DIAGNOSIS — G43101 Migraine with aura, not intractable, with status migrainosus: Secondary | ICD-10-CM

## 2015-10-22 MED ORDER — LAMOTRIGINE ER 100 MG PO TB24: 100.0000 mg | ORAL_TABLET | Freq: Every day | ORAL | Status: DC

## 2015-10-22 NOTE — Progress Notes (Signed)
HPI: Ms Zahniser, 69 -year-old pleasant [place] lady returns for followup.   She had more falls, but none associated with a seizure or olfactory aura.   Continues medication -  She was so dizzy she felt she fall , but she has less headaches and is less fatigued and sleepy on CPAP. She is highly compliant.  The patient is a 97% compliance on her last download 29 out of 30 days was 5 hours and 45 minutes of average use per night she is on an auto set 4-12 cm water pressure with 3 cm EPR her residual AHI is 1.7 which is an excellent result Epworth remains at 10 points fatigue at only 20 points.  06-28-12  Patient was seen in the EMU at [facility] and diagnosed with an olfactory aura, no EEG abnormalities. 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  The Keppra was well tolerated. She has only once a week any aura, not daily.  November 21, 2012. Patient developed depression, possibly worseing on Keppra which we weaned off. She is no longer taking Coumadin for the presumed upper extremity DVT (Dr. Azucena Kuba, [facility])  perhaps rather a thrombophlebitis. She was changed to Lamictal and was doing well on 100 mg XR daily in the AM  after a slow titration.  She will progress to 100 mg now.   05/21/13- followup visit today and patient has had 2 auras  since last seen. Her Lamictal dose is currently at 150 daily instead of 200 as Dr. Vickey Huger had ordered.  Apparently she did not understand that she was supposed to continue to titrate to 200. She denies any side effects to the medication. He has no new neurologic complaints. She is currently receiving lumbar epidural steroids by Dr. Arta Bruce for her low back pain.  05-22-14 - Psychiatrist recommend Wellbutrin, but changed to Zoloft after seizure concerns were discussed.  She has better mood on Lamictal,  He was sad " blue " on Keppra.  She was hospitalized in  [place] , August 28 2013, after a seizure or syncope on a cruise. Her husband fainted  and needed hospital admission , too.  Both Bernacchis spent their [employer] in a hospital in [place]! Her husband is concerned about her loud snoring, she is obese and has been very fatigued. She denies waking up with headaches. Both have witnessed each others apneas.    10-22-15  1)Revisit for 30 minutes, dedicated to personal , subjective concerns of memory loss. Naming difficulties.  No flowsheet data found.  30 / 30 points with 11 point word fluency. No difficulties with Trail making, naming, subtraction , abstruction or delayed recall. Fully oriented.   2)Epworth Sleepiness Scale endorsed today at 5 points the patient is using CPAP compliantly fatigue severity score 32 points. The patient has an 86% compliance for use of CPAP 97% for use by 29/30  days.  Average user time is 5 hours 55 minutes she is using an AutoSet between 4 and 12 cm with a ramp time and 3 cm EPR. The AHI was reduced to 1.4.  3)  She has a lot of stress now: About 3 month ago her eldest , 36 year old daughter announced that she wants no longer to keep in contact with her parents. Her depression score on Geriatric depression is 6.5 out of 15 points, and elevated.   4)The patient feels her diabetes affects her  visual acuity.  5) No olfactory hallucinations/ aura.     ROS:  Weight gain, easy bruising, allergies, snoring  Physical Exam General: well developed, well nourished, seated, in no evident distress Head: head normocephalic and atraumatic. Oropharynx benign Neck: supple with no carotid  bruits Cardiovascular: regular rate and rhythm, no murmurs  Neurologic Exam Mental Status: Awake and fully alert. Oriented to place and time. Follows all commands. Speech and language normal.   Cranial Nerves: Fundoscopic exam without evidence of papilledema. Pupils equal, briskly reactive to light.  Extraocular movements  full with saccades, not  nystagmus. Visual fields - she reports trouble seeing the floor.  Hearing intact and symmetric to finger snap. Facial sensation intact. Face, tongue, palate move normally and symmetrically.  Neck flexion and extension normal.  Motor: Normal bulk and tone. Normal strength in all tested extremity muscles.No focal weakness, but reports foot drop when her sugar is low !   Sensory : loss of vibration in toes.  Coordination: Rapid alternating movements normal in all extremities.  Finger-to-nose  performed accurately bilaterally. Gait and Station: Arises from chair without difficulty. Stance is wide-based, ambulates with a single-point cane   Reflexes: 2+ and symmetric. Toes downgoing.     ASSESSMENT:   1)Seizures with Olfactory aura which have responded to Lamictal, no longer having Auras . 2)OSA on CPAP: she had a successful pressure adjustment and has no longer headaches,  the patient is continuously using an auto CPAP between 4 and 12 cm water allowing for a residual AHI of only 1.4    Her compliance was 97% for 30 days with 5 hours and 56 minutes of nightly use.    The EPR level 3 cm water she does have a moderate air leak. She feels that after 4 hours she wakes up with a sudden higher pressure, perhaps due to leak? 3) obesity - unchanged, she is willing to reduce her carbohydrates 4) diabetes poorly controlled- and may cause vision loss, changes in acuity  , too.  5) depression treated with Zoloft by Dr. Madaline Guthrie . 6) gait disorder, uses a cane, last fall was 6 weeks ago- broke her little finger on the right hand.      PLAN:  30 minutes RV with MOCA, no evidence of MCI or dementia.   Lamictal to 100 mg XR  1 tab daily,  need refills. Yearly CPAP revisit , will  schedule with her husband same day .   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  Fall prevention information given and urged to  consider PT .  Followup yearly and when necessary

## 2016-01-21 LAB — TSH: TSH: 3.74 u[IU]/mL (ref 0.41–5.90)

## 2016-01-21 LAB — BASIC METABOLIC PANEL
BUN: 16 mg/dL (ref 4–21)
Creatinine: 0.6 mg/dL (ref 0.5–1.1)
Glucose: 104 mg/dL
POTASSIUM: 4.7 mmol/L (ref 3.4–5.3)
Sodium: 141 mmol/L (ref 137–147)

## 2016-01-21 LAB — CBC AND DIFFERENTIAL
HEMATOCRIT: 43 % (ref 36–46)
HEMOGLOBIN: 13.9 g/dL (ref 12.0–16.0)
PLATELETS: 280 10*3/uL (ref 150–399)
WBC: 8.6 10*3/mL

## 2016-01-21 LAB — HEMOGLOBIN A1C: Hemoglobin A1C: 6.1

## 2016-01-21 LAB — HEPATIC FUNCTION PANEL
ALT: 15 U/L (ref 7–35)
AST: 15 U/L (ref 13–35)
Alkaline Phosphatase: 81 U/L (ref 25–125)

## 2016-01-21 LAB — LIPID PANEL
CHOLESTEROL: 242 mg/dL — AB (ref 0–200)
HDL: 52 mg/dL (ref 35–70)
LDL CALC: 149 mg/dL
Triglycerides: 206 mg/dL — AB (ref 40–160)

## 2016-01-28 ENCOUNTER — Ambulatory Visit (INDEPENDENT_AMBULATORY_CARE_PROVIDER_SITE_OTHER): Payer: Medicare Other | Admitting: Family Medicine

## 2016-01-28 ENCOUNTER — Encounter: Payer: Self-pay | Admitting: Family Medicine

## 2016-01-28 VITALS — BP 118/60 | HR 83 | Temp 98.3°F | Ht 61.0 in | Wt 260.0 lb

## 2016-01-28 DIAGNOSIS — R062 Wheezing: Secondary | ICD-10-CM | POA: Diagnosis not present

## 2016-01-28 DIAGNOSIS — J219 Acute bronchiolitis, unspecified: Secondary | ICD-10-CM | POA: Diagnosis not present

## 2016-01-28 MED ORDER — PREDNISONE 20 MG PO TABS: ORAL_TABLET | ORAL | Status: DC

## 2016-01-28 MED ORDER — DOXYCYCLINE HYCLATE 100 MG PO CAPS: 100.0000 mg | ORAL_CAPSULE | Freq: Two times a day (BID) | ORAL | Status: DC

## 2016-01-28 MED FILL — predniSONE 20 MG TABS: 20 | 10 days supply | Qty: 10 | Fill #0

## 2016-01-28 MED FILL — DOXYCYCLINE HYC 100 MG CAP: 100 | 10 days supply | Qty: 20 | Fill #0

## 2016-01-28 NOTE — Progress Notes (Addendum)
[facility] [address], [address] [place], [place] [address] [phone]  Date:  01/28/2016   Name:  Nicole Mccann   DOB:  04/06/1946   MRN:  [id number]  PCP:  Marga Melnick, MD    Chief Complaint: No chief complaint on file.   History of Present Illness:  Nicole Mccann is a 70 y.o. very pleasant female patient who presents with the following:  History of obesity, DM, OSA, DVT, asthma. She notes a history of allergies and felt like her allergies became worse 3 weeks ago. Then she got better for a while, until her sx came back about 3 days ago with cough and wheezing.    2 days ago she noted that she was coughing up colored mucus. She started taking some mucinex.  She uses her ventolin twice today but is still wheezing some.  No significant SOB  No fever Her sx are now in her chest- a little PND but no other sinus/ nasal symptoms at this time Lab Results  Component Value Date   HGBA1C 6.0* 07/08/2012   Her diabetes is managed by Dr. Chestine Spore, her endocrinologist.  She reports a recent A1c but is not sure of the number yet.  However she also reports that Dr. Chestine Spore has been happy keeping her on metformin and has not felt that she needed any other DM therapy  She used prednisone last fall wihtout any problems and is comfortable using this again  Patient Active Problem List   Diagnosis Date Noted  . Encounter for medication management 04/15/2015  . Right shoulder pain 04/04/2015  . Adjustment disorder with mixed anxiety and depressed mood 03/14/2015  . Rotator cuff (capsule) sprain 03/13/2015  . Seizure disorder, temporal lobe, intractable (HCC) 10/17/2014  . 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 Severe obesity (BMI >= 40) (HCC) 10/17/2014  . OSA on CPAP 10/17/2014  . Hypersomnia with sleep apnea, unspecified 05/22/2014  . Injury of left knee  08/22/2013  . Variants of migraine, not elsewhere classified, without mention of intractable migraine without mention of status migrainosus 05/22/2013  . 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 Spinal stenosis of lumbar region 12/17/2012  . Pulmonary embolism (HCC) 07/09/2012  . DVT (deep venous thrombosis) (HCC) 07/07/2012  . Atypical seizure (HCC) 06/07/2012  . Phlebitis following infusion 06/05/2012  . BENIGN POSITIONAL VERTIGO 11/06/2009  . DIABETES MELLITUS, TYPE II 04/04/2009  . ALLERGIC RHINITIS 04/04/2009  . ASTHMA 04/04/2009  . DIVERTICULOSIS, COLON 04/04/2009  . HYPERLIPIDEMIA NEC/NOS 06/29/2007  . DISORDER, DYSMETABOLIC SYNDROME X 06/29/2007    Past Medical History  Diagnosis Date  . Hyperlipidemia   . Allergy   . Diabetes mellitus   . Asthma   . Atypical seizure (HCC) 06/07/2012  . DVT (deep venous thrombosis) (HCC)   . Olfactory aura   . Shingles 07/2013  . Headache(784.0)   . Hypersomnia with sleep apnea, unspecified 05/22/2014  . OSA on CPAP   . Anxiety     Dr Madaline Guthrie  . Depression     Dr Madaline Guthrie    Past Surgical History  Procedure Laterality Date  . Appendectomy    . Tonsillectomy     . Cholecystectomy    . Colonoscopy  2006    [facility]  . Rotator cuff surgery      Social History  Substance Use Topics  . Smoking  status: Never Smoker   . Smokeless tobacco: Never Used  . Alcohol Use: 0.0 oz/week    0 Standard drinks or equivalent per week     Comment: occ    Family History  Problem Relation Age of Onset  . Stomach cancer Father   . Coronary artery disease Father   . Breast cancer Sister     breast cancer  . Diabetes Maternal Aunt   . Diabetes Paternal Aunt   . Stroke Neg Hx   . Prostate cancer Brother     Allergies  Allergen Reactions  . Mushroom Ext Cmplx(Shiitake-Reishi-Mait) Anaphylaxis  . Shellfish Allergy Anaphylaxis  . Sulfonamide Derivatives Anaphylaxis    unknown  . Iodinated Diagnostic Agents     Other  reaction(s): Other (See Comments) Bad head ahce  . Acetaminophen     REACTION: STOMACH DISCOMFORT  . Butalbital-Aspirin-Caffeine     unknown  . Clarithromycin Nausea And Vomiting  . Fluticasone-Salmeterol     Feels like something in the throat  . Montelukast Sodium     unknown  . Moxifloxacin     unknown  . Orphenadrine Nausea And Vomiting  . Orphenadrine Citrate     unknown  . Penicillins     unknown    Medication list has been reviewed and updated.  Current Outpatient Prescriptions on File Prior to Visit  Medication Sig Dispense Refill  . albuterol (PROVENTIL HFA;VENTOLIN HFA) 108 (90 BASE) MCG/ACT inhaler Inhale 2 puffs into the lungs every 6 (six) hours as needed for wheezing or shortness of breath. 1 Inhaler 1  . buPROPion (WELLBUTRIN XL) 150 MG 24 hr tablet Take 150 mg by mouth daily.    . Docosanol (ABREVA EX) Apply topically.    . LamoTRIgine (LAMICTAL XR) 100 MG TB24 Take 1 tablet (100 mg total) by mouth daily. 30 tablet 11  . loratadine (CLARITIN) 10 MG tablet Take 10 mg by mouth daily.      . metFORMIN (GLUCOPHAGE-XR) 750 MG 24 hr tablet 1 tablet daily.    . sertraline (ZOLOFT) 50 MG tablet Take 75 mg by mouth daily.      No current facility-administered medications on file prior to visit.    Review of Systems:  As per HPI- otherwise negative.   Physical Examination: Filed Vitals:   01/28/16 1645  BP: 118/60  Pulse: 83  Temp: 98.3 F (36.8 C)   Filed Vitals:   01/28/16 1645  Height:  (1.549 m)  Weight: 260 lb (117.935 kg)   Body mass index is 49.15 kg/(m^2). Ideal Body Weight: Weight in (lb) to have BMI = 25: 132  GEN: WDWN, NAD, Non-toxic, A & O x 3, obese, looks well but is coughing.  Accompanied by her husband today HEENT: Atraumatic, Normocephalic. Neck supple. No masses, No LAD.  Bilateral TM wnl, oropharynx normal.  PEERL,EOMI.   Ears and Nose: No external deformity. CV: RRR, No M/G/R. No JVD. No thrill. No extra heart sounds. PULM:  CTA B, no crackles, rhonchi. No retractions. No resp. distress. No accessory muscle use.  Diffuse mild wheezes in bilateral lungs EXTR: No c/c/e NEURO Normal gait.  PSYCH: Normally interactive. Conversant. Not depressed or anxious appearing.  Calm demeanor.   Assessment and Plan: Wheezing - Plan: predniSONE (DELTASONE) 20 MG tablet, doxycycline (VIBRAMYCIN) 100 MG capsule  Acute bronchiolitis due to unspecified organism - Plan: predniSONE (DELTASONE) 20 MG tablet  Here today with cough productive of discolored mucus and wheezing.   Sx for about 3 days. Known  history of asthma.  She does have albuterol to use as needed Will use prednisone and doxycycline- have her take 20 mg of pred today and tomorrow I will contact her endocrinologist to get her most recent A1c. If well controlled and if wheezing continues can increase her pred dose to 40 mg Listed intolerance to fluticasone-solumedrol inhaler however she was not actually allergic to this and used prednisone in the recent past without any problems   Signed Abbe Amsterdam, MD  Called her endocrinologist on 3/23 and got her most recent a1c 6.1% Pt reports that she is better on just 20 mg of prednisone yesterday.  Advised her that she can continue to take 1 or 2 pills a day (just 1 if this is sufficient) for the next 5 days

## 2016-01-28 NOTE — Patient Instructions (Signed)
Continue to use the inhaler as needed-  For the time being use the prednisone once a day  I will touch base with Dr. Chestine Spore and we may increase your dose a bit tomorrow Use the doxycycline twice a day- take it with food and water

## 2016-04-08 ENCOUNTER — Ambulatory Visit: Payer: Medicare Other | Admitting: Family Medicine

## 2016-04-28 ENCOUNTER — Ambulatory Visit: Payer: Medicare Other | Admitting: Family Medicine

## 2016-05-05 ENCOUNTER — Encounter: Payer: Self-pay | Admitting: Family Medicine

## 2016-05-05 ENCOUNTER — Ambulatory Visit (INDEPENDENT_AMBULATORY_CARE_PROVIDER_SITE_OTHER): Payer: Medicare Other | Admitting: Family Medicine

## 2016-05-05 VITALS — BP 139/57 | HR 71 | Temp 98.0°F | Ht 59.0 in | Wt 266.0 lb

## 2016-05-05 DIAGNOSIS — Z119 Encounter for screening for infectious and parasitic diseases, unspecified: Secondary | ICD-10-CM | POA: Diagnosis not present

## 2016-05-05 DIAGNOSIS — Z0000 Encounter for general adult medical examination without abnormal findings: Secondary | ICD-10-CM | POA: Diagnosis not present

## 2016-05-05 DIAGNOSIS — Z1211 Encounter for screening for malignant neoplasm of colon: Secondary | ICD-10-CM

## 2016-05-05 DIAGNOSIS — E2839 Other primary ovarian failure: Secondary | ICD-10-CM

## 2016-05-05 NOTE — Patient Instructions (Signed)
It was great to see you today!  I will get in touch with Dr. Vickey Huger to find out about your stopping the seizure medication.  I will also ask Dr. Chestine Spore to send me a copy of your most recent labs.  I think doing the silver sneaker's program at the [facility] is a great idea!!

## 2016-05-05 NOTE — Progress Notes (Signed)
Pre visit review using our clinic review tool, if applicable. No additional management support is needed unless otherwise documented below in the visit note. 

## 2016-05-05 NOTE — Progress Notes (Signed)
Nicole Mccann is a 70 y.o. female who presents for Medicare Annual/Subsequent preventive examination.  Preventive Screening-Counseling & Management  Tobacco History  Smoking status  . Never Smoker   Smokeless tobacco  . Never Used     Problems Prior to Visit 1. Seizure disorder.  She has stopped taking her lamictal- she stopped taking this about 2.5 weeks ago.  She last had a  possible seizure about a year ago.  She has never had a grand mal seizure however.   2. She is still taking her metformin for her DM- however she is just taking this off an on, when her glucose is high. Her DM is managed by Dr. Margaretmary Bayley  Most recent labs on Epic were in 2014.    Current Problems (verified) Patient Active Problem List   Diagnosis Date Noted  . Encounter for medication management 04/15/2015  . Right shoulder pain 04/04/2015  . Adjustment disorder with mixed anxiety and depressed mood 03/14/2015  . Rotator cuff (capsule) sprain 03/13/2015  . Seizure disorder, temporal lobe, intractable (HCC) 10/17/2014  . 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 Severe obesity (BMI >= 40) (HCC) 10/17/2014  . OSA on CPAP 10/17/2014  . Hypersomnia with sleep apnea, unspecified 05/22/2014  . Injury of left knee 08/22/2013  . Variants of migraine, not elsewhere classified, without mention of intractable migraine without mention of status migrainosus 05/22/2013  . 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 Spinal stenosis of lumbar region 12/17/2012  . Pulmonary embolism (HCC) 07/09/2012  . DVT (deep venous thrombosis) (HCC) 07/07/2012  . Atypical seizure (HCC) 06/07/2012  . Phlebitis following infusion 06/05/2012  . BENIGN POSITIONAL VERTIGO 11/06/2009  . DIABETES MELLITUS, TYPE II 04/04/2009  . ALLERGIC RHINITIS 04/04/2009  . ASTHMA 04/04/2009  . DIVERTICULOSIS, COLON 04/04/2009  . HYPERLIPIDEMIA NEC/NOS  06/29/2007  . DISORDER, DYSMETABOLIC SYNDROME X 06/29/2007    Medications Prior to Visit Current Outpatient Prescriptions on File Prior to Visit  Medication Sig Dispense Refill  . metFORMIN (GLUCOPHAGE-XR) 750 MG 24 hr tablet 1 tablet daily. Reported on 05/05/2016     No current facility-administered medications on file prior to visit.    Current Medications (verified) Current Outpatient Prescriptions  Medication Sig Dispense Refill  . Acetaminophen (TYLENOL EXTRA STRENGTH PO) Take 1 tablet by mouth as needed.    . metFORMIN (GLUCOPHAGE-XR) 750 MG 24 hr tablet 1 tablet daily. Reported on 05/05/2016     No current facility-administered medications for this visit.     Allergies (verified) Mushroom ext cmplx(shiitake-reishi-mait); Shellfish allergy; Sulfonamide derivatives; Iodinated diagnostic agents; Acetaminophen; Butalbital-aspirin-caffeine; Clarithromycin; Fluticasone-salmeterol; Montelukast sodium; Moxifloxacin; Orphenadrine; Orphenadrine citrate; and Penicillins   PAST HISTORY  Family History Family History  Problem Relation Age of Onset  . Stomach cancer Father   . Coronary artery disease Father   . Breast cancer Sister     breast cancer  . Diabetes Maternal Aunt   . Diabetes Paternal Aunt   . Stroke Neg Hx   . Prostate cancer Brother     Social History Social History  Substance Use Topics  . Smoking status: Never Smoker   . Smokeless tobacco: Never Used  . Alcohol Use: 0.0 oz/week    0 Standard drinks or equivalent per week     Comment: occ     Are there smokers in your home (other than you)? No  Risk Factors Current exercise habits: Exercise is limited by orthopedic  condition(s): knee issues.  Dietary issues discussed: diabetic diet   Cardiac risk factors: advanced age (older than 60 for men, 70 for women), diabetes mellitus, obesity (BMI >= 30 kg/m2) and sedentary lifestyle.  Depression Screen (Note: if answer to either of the following is "Yes", a more  complete depression screening is indicated)   Over the past two weeks, have you felt down, depressed or hopeless? No  Over the past two weeks, have you felt little interest or pleasure in doing things? No  Have you lost interest or pleasure in daily life? No  Do you often feel hopeless? No  Do you cry easily over simple problems? Yes, but this is just how I am   Activities of Daily Living In your present state of health, do you have any difficulty performing the following activities?:  Driving? Yes Managing money?  No Feeding yourself? No Getting from bed to chair? YesBP 139/57 mmHg  Pulse 71  Temp(Src) 98 F (36.7 C) (Oral)  Ht  (1.499 m)  Wt 266 lb (120.657 kg)  BMI 53.70 kg/m2  SpO2 98%                                                               Climbing a flight of stairs? Yes Preparing food and eating?: No Bathing or showering? No Getting dressed: No, except for reaching her back with shoulder issue  Getting to the toilet? No Using the toilet:No Moving around from place to place: Yes In the past year have you fallen or had a near fall?:No   Are you sexually active?  No  Do you have more than one partner?  No  Hearing Difficulties: No Do you often ask people to speak up or repeat themselves? No Do you experience ringing or noises in your ears? Yes Do you have difficulty understanding soft or whispered voices? No   Do you feel that you have a problem with memory? Yes  Do you often misplace items? Yes  Do you feel safe at home?  Yes  Cognitive Testing  Alert? Yes  Normal Appearance?Yes  Oriented to person? Yes  Place? Yes   Time? Yes  Recall of three objects?  Yes  Apple, boy, wagon  Can perform simple calculations? Yes  Displays appropriate judgment?Yes  Can read the correct time from a watch face?Yes   Advanced Directives have been discussed with the patient? Yes  List the Names of Other Physician/Practitioners you currently  use: 1.  Dr. Chestine Spore, podiatry 2. Neurology, Dr. Vickey Huger 3. Dr. Chestine Spore, endocinology. She still sees him  Indicate any recent Medical Services you may have received from other than [facility] providers in the past year (date may be approximate).  Immunization History  Administered Date(s) Administered  . Influenza Split 07/25/2012  . Influenza Whole 09/27/2007, 08/21/2008, 09/17/2009  . Influenza,inj,Quad PF,36+ Mos 07/23/2015  . Pneumococcal Conjugate-13 03/13/2015  . Td 11/08/2001  . Tdap 08/17/2013    Screening Tests Health Maintenance  Topic Date Due  . Hepatitis C Screening  03-May-1946  . FOOT EXAM  12/23[birth year]1953  . COLONOSCOPY  12/23/1993  . ZOSTAVAX  12/24/2003  . DEXA SCAN  12/23/2008  . URINE MICROALBUMIN  02/14/2011  . HEMOGLOBIN A1C  01/05/2013  . OPHTHALMOLOGY EXAM  06/26/2015  .  PNA vac Low Risk Adult (2 of 2 - PPSV23) 03/12/2016  . INFLUENZA VACCINE  06/08/2016  . MAMMOGRAM  06/25/2016  . TETANUS/TDAP  08/18/2023    All answers were reviewed with the patient and necessary referrals were made:  COPLAND,JESSICA, MD   05/05/2016   History reviewed: allergies, current medications, past family history, past medical history, past social history, past surgical history and problem list  Review of Systems Pertinent items are noted in HPI.    Objective:     Vision by Snellen chart: right eye:20/40, left eye:20/25  Body mass index is 53.7 kg/(m^2). BP 139/57 mmHg  Pulse 71  Temp(Src) 98 F (36.7 C) (Oral)  Ht 4\' 11"  (1.499 m)  Wt 266 lb (120.657 kg)  BMI 53.70 kg/m2  SpO2 98%    GEN: WDWN, NAD, Non-toxic, A & O x 3, obese, looks well HEENT: Atraumatic, Normocephalic. Neck supple. No masses, No LAD. Ears and Nose: No external deformity. CV: RRR, No M/G/R. No JVD. No thrill. No extra heart sounds. PULM: CTA B, no wheezes, crackles, rhonchi. No retractions. No resp. distress. No accessory muscle use. ABD: S, NT, ND. No rebound. No HSM. EXTR: No c/c/e NEURO Normal  gait.  PSYCH: Normally interactive. Conversant. Not depressed or anxious appearing.  Calm demeanor.  Foot exam performed- normal    Assessment:     Routine history and physical examination of adult  Medicare annual wellness visit, subsequent  Screening examination for infectious disease - Plan: Hepatitis C antibody  Estrogen deficiency - Plan: DG Bone Density  Colon cancer screening  Morbid obesity, unspecified obesity type (HCC)      Plan:   Here today for her wellness exam Referral for dexa scan and for colguard testing Will get her most recent labs from her endocrinologist Contacted her neurologist about her having stopped her lamictal She is interested in an exercise program; provided information about silver sneakers programs locally During the course of the visit the patient was educated and counseled about appropriate screening and preventive services including:    Bone densitometry screening  Colorectal cancer screening  Nutrition counseling   Advanced directives: has an advanced directive - a copy HAS NOT been provided.  Diet review for nutrition referral? Yes ____  Not Indicated __x__   Patient Instructions (the written plan) was given to the patient.  Medicare Attestation I have personally reviewed: The patient's medical and social history Their use of alcohol, tobacco or illicit drugs Their current medications and supplements The patient's functional ability including ADLs,fall risks, home safety risks, cognitive, and hearing and visual impairment Diet and physical activities Evidence for depression or mood disorders  The patient's weight, height, BMI, and visual acuity have been recorded in the chart.  I have made referrals, counseling, and provided education to the patient based on review of the above and I have provided the patient with a written personalized care plan for preventive services.     Abbe Amsterdam, MD   05/05/2016

## 2016-05-06 LAB — HEPATITIS C ANTIBODY: HCV AB: NEGATIVE

## 2016-05-13 ENCOUNTER — Other Ambulatory Visit: Payer: Self-pay | Admitting: Family Medicine

## 2016-05-18 ENCOUNTER — Telehealth: Payer: Self-pay | Admitting: Family Medicine

## 2016-05-18 NOTE — Telephone Encounter (Signed)
Letter to pt, will mail colguard form to her

## 2016-05-25 ENCOUNTER — Ambulatory Visit (INDEPENDENT_AMBULATORY_CARE_PROVIDER_SITE_OTHER): Payer: Medicare Other | Admitting: Family Medicine

## 2016-05-25 ENCOUNTER — Encounter: Payer: Self-pay | Admitting: Family Medicine

## 2016-05-25 VITALS — BP 127/83 | HR 81 | Ht 59.0 in | Wt 264.0 lb

## 2016-05-25 DIAGNOSIS — M545 Low back pain, unspecified: Secondary | ICD-10-CM

## 2016-05-25 NOTE — Patient Instructions (Signed)
Your back pain is due to a combination of spinal stenosis and SI joint dysfunction. Start physical therapy and do home exercises and stretches on days you don't go to therapy. Tylenol 500mg  1-2 tabs three times a day for pain as needed. We can consider the following if you're struggling: prednisone dose pack, prescription anti-inflammatory, repeating your imaging (xrays and MRI), epidural steroid injection(s). Follow up with me in 1 month to 6 weeks for a recheck. I think water aerobics are a great idea.

## 2016-05-26 DIAGNOSIS — M545 Low back pain, unspecified: Secondary | ICD-10-CM | POA: Insufficient documentation

## 2016-05-26 HISTORY — DX: Low back pain, unspecified: M54.50

## 2016-05-26 NOTE — Progress Notes (Signed)
PCP: COPLAND,JESSICA, MD  Subjective:   HPI: Patient is a 70 y.o. female here for low back pain.  Patient reports she's had worsening low back pain for past 2 months. Describes pain as a pressure, worse when lying in bed and trying to get up. Tried pain patch, icing, tylenol, ibuprofen. Reports the ibuprofen upset her stomach. Some tingling into left toes at times (1st-3rd). No bowel/bladder dysfunction. 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 She notes icing and an inflatable belt help with her pain.  Past Medical History  Diagnosis Date  . Hyperlipidemia   . Allergy   . Diabetes mellitus   . Asthma   . Atypical seizure (HCC) 06/07/2012  . DVT (deep venous thrombosis) (HCC)   . Olfactory aura   . Shingles 07/2013  . Headache(784.0)   . Hypersomnia with sleep apnea, unspecified 05/22/2014  . OSA on CPAP   . Anxiety     Dr Madaline Guthrie  . Depression     Dr Madaline Guthrie    Current Outpatient Prescriptions on File Prior to Visit  Medication Sig Dispense Refill  . Acetaminophen (TYLENOL EXTRA STRENGTH PO) Take 1 tablet by mouth as needed.    . metFORMIN (GLUCOPHAGE-XR) 750 MG 24 hr tablet 1 tablet daily. Reported on 05/05/2016     No current facility-administered medications on file prior to visit.    Past Surgical History  Procedure Laterality Date  . Appendectomy    . Tonsillectomy     . Cholecystectomy    . Colonoscopy  2006    [facility]  . Rotator cuff surgery      Allergies  Allergen Reactions  . Mushroom Ext Cmplx(Shiitake-Reishi-Mait) Anaphylaxis  . Shellfish Allergy Anaphylaxis  . Sulfonamide Derivatives Anaphylaxis    unknown  . Iodinated Diagnostic Agents     Other reaction(s): Other (See Comments) Bad head ahce  . Acetaminophen     REACTION: STOMACH DISCOMFORT  . Butalbital-Aspirin-Caffeine     unknown  . Clarithromycin Nausea  And Vomiting  . Fluticasone-Salmeterol     Feels like something in the throat  . Montelukast Sodium     unknown  . Moxifloxacin     unknown  . Orphenadrine Nausea And Vomiting  . Orphenadrine Citrate     unknown  . Penicillins     unknown    Social History   Social History  . Marital Status: Married    Spouse Name: Lars Mage  . Number of Children: 2  . Years of Education: College   Occupational History  . Not on file.   Social History Main Topics  . Smoking status: Never Smoker   . Smokeless tobacco: Never Used  . Alcohol Use: 0.0 oz/week    0 Standard drinks or equivalent per week     Comment: occ  . Drug Use: No  . Sexual Activity: Not on file   Other Topics Concern  . Not on file   Social History Narrative   Patient is married Lars Mage) and lives at home with her husband.   Patient has two adult children.   Patient is retired.   Patient has a college education.   Patient is right-handed.   Patient drinks one cup of tea daily.    Family History  Problem Relation Age of Onset  . Stomach cancer Father   . Coronary artery disease Father   . Breast  cancer Sister     breast cancer  . Diabetes Maternal Aunt   . Diabetes Paternal Aunt   . Stroke Neg Hx   . Prostate cancer Brother     BP 127/83 mmHg  Pulse 81  Ht 4\' 11"  (1.499 m)  Wt 264 lb (119.75 kg)  BMI 53.29 kg/m2  Review of Systems: See HPI above.    Objective:  Physical Exam:  Gen: NAD, comfortable in exam room  Back: No gross deformity, scoliosis. TTP bilateral SI joints.  No midline or bony TTP.  No paraspinal tenderness otherwise FROM with pain on flexion. Strength LEs 5/5 all muscle groups.   2+ MSRs in patellar and achilles tendons, equal bilaterally. Negative SLRs. Sensation intact to light touch bilaterally. Negative logroll bilateral hips Mild pain with bilateral fabers and decreased motion.  Negative piriformis stretches.    Assessment & Plan:  1. Low back pain - has known at least  moderate spinal stenosis - exam today also indicates bilateral SI joint dysfunction.  She will start physical therapy and do home exercises, stretches on days she doesn't go to therapy.  Tylenol 1-2 tabs three times a day.  Water aerobics.  Consider nsaids, repeat imaging, ESI, steroid dose pack though hopefully won't need to pursue these measures.

## 2016-05-26 NOTE — Assessment & Plan Note (Signed)
has known at least moderate spinal stenosis - exam today also indicates bilateral SI joint dysfunction.  She will start physical therapy and do home exercises, stretches on days she doesn't go to therapy.  Tylenol 1-2 tabs three times a day.  Water aerobics.  Consider nsaids, repeat imaging, ESI, steroid dose pack though hopefully won't need to pursue these measures.

## 2016-06-03 ENCOUNTER — Ambulatory Visit (INDEPENDENT_AMBULATORY_CARE_PROVIDER_SITE_OTHER): Payer: Medicare Other | Admitting: Neurology

## 2016-06-03 ENCOUNTER — Encounter: Payer: Self-pay | Admitting: Neurology

## 2016-06-03 VITALS — BP 110/78 | HR 88 | Ht 59.0 in | Wt 266.0 lb

## 2016-06-03 DIAGNOSIS — E0842 Diabetes mellitus due to underlying condition with diabetic polyneuropathy: Secondary | ICD-10-CM | POA: Diagnosis not present

## 2016-06-03 DIAGNOSIS — Z9989 Dependence on other enabling machines and devices: Principal | ICD-10-CM

## 2016-06-03 DIAGNOSIS — G4733 Obstructive sleep apnea (adult) (pediatric): Secondary | ICD-10-CM | POA: Diagnosis not present

## 2016-06-03 NOTE — Progress Notes (Signed)
HPI: Nicole Mccann Mccann, 70 -year-old pleasant [place] lady returns for followup.   She had more falls, but none associated with a seizure or olfactory aura.   Continues medication -  She was so dizzy she felt she fall , but she has less headaches and is less fatigued and sleepy on CPAP. She is highly compliant.  The patient is a 97% compliance on Nicole Mccann last download 29 out of 30 days was 5 hours and 45 minutes of average use per night she is on an auto set 4-12 cm water pressure with 3 cm EPR Nicole Mccann residual AHI is 1.7 which is an excellent result Epworth remains at 10 points fatigue at only 20 points.  06-28-12  Patient was seen in the EMU at [facility] and diagnosed with an olfactory aura, no EEG abnormalities. 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  The Keppra was well tolerated. She has only once a week any aura, not daily.  November 21, 2012. Patient developed depression, possibly worseing on Keppra which we weaned off. She is no longer taking Coumadin for the presumed upper extremity DVT (Dr. Azucena Kuba, [facility])  perhaps rather a thrombophlebitis. She was changed to Lamictal and was doing well on 100 mg XR daily in the AM  after a slow titration.  She will progress to 100 mg now.   05/21/13- followup visit today and patient has had 2 auras  since last seen. Nicole Mccann Lamictal dose is currently at 150 daily instead of 200 as Dr. Vickey Huger had ordered.  Apparently she did not understand that she was supposed to continue to titrate to 200. She denies any side effects to the medication. He has no new neurologic complaints. She is currently receiving lumbar epidural steroids by Dr. Arta Bruce for Nicole Mccann low back pain.  05-22-14 - Psychiatrist recommend Wellbutrin, but changed to Zoloft after seizure concerns were discussed.  She has better mood on Lamictal,  He was sad " blue " on Keppra.  She was hospitalized in  [place] , August 28 2013, after a seizure or syncope on a cruise. Nicole Mccann Mccann fainted  and needed hospital admission , too.  Both Nicole Mccann Mccann spent their [employer] in a hospital in [place]! Nicole Mccann Mccann is concerned about Nicole Mccann loud snoring, she is obese and has been very fatigued. She denies waking up with headaches. Both have witnessed each others apneas.    10-22-15  1)Revisit for 30 minutes, dedicated to personal , subjective concerns of memory loss. Naming difficulties.   30 / 30 points with 11 point word fluency. No difficulties with Trail making, naming, subtraction , abstruction or delayed recall. Fully oriented.  Epworth Sleepiness Scale endorsed today at 5 points the patient is using CPAP compliantly fatigue severity score 32 points. The patient has an 86% compliance for use of CPAP 97% for use by 29/30  days.  Average user time is 5 hours 55 minutes she is using an AutoSet between 4 and 12 cm with a ramp time and 3 cm EPR. The AHI was reduced to 1.4. Montreal Cognitive Assessment  06/03/2016  Visuospatial/ Executive (0/5) 5  Naming (0/3) 3  Attention: Read list of digits (0/2) 2  Attention: Read list of letters (0/1) 1  Attention: Serial 7 subtraction starting at 100 (0/3) 3  Language: Repeat phrase (0/2) 1  Language : Fluency (0/1) 0  Abstraction (0/2) 2  Delayed Recall (0/5) 4  Orientation (0/6) 5  Total 26  Adjusted Score (based on education) 26    Interval history from 06/03/2016. [name] is seen here today for a CPAP compliance visit. She also did well with Nicole Mccann Montral cognitive assessment at 26 out of 30 points. She endorsed the Epworth sleepiness score at only 8 points, Nicole Mccann compliance for the last 23 days has been 100% she uses the machine on average 5 hours and 10 minutes. She is using an AutoSet between 4 and 12 cm water with 3 cm EPR and a residual AHI of 2.2. The 95th percentile pressure is 10.1.   ROS:  Weight gain, easy bruising, allergies,  snoring  Physical Exam General: well developed, well nourished, seated, in no evident distress Head: head normocephalic and atraumatic. Oropharynx benign Neck: supple with no carotid  bruits Cardiovascular: regular rate and rhythm, no murmurs  Neurologic Exam Mental Status: Awake and fully alert. Oriented to place and time. Follows all commands. Speech and language normal.   Cranial Nerves: Fundoscopic exam without evidence of papilledema. Pupils equal, briskly reactive to light.  Extraocular movements full with saccades, not  nystagmus. Visual fields - she reports trouble seeing the floor.  Hearing intact and symmetric to finger snap. Facial sensation intact. Face, tongue, palate move normally and symmetrically.  Neck flexion and extension normal.  Motor: Normal bulk and tone. Normal strength in all tested extremity muscles.No focal weakness, but reports foot drop when Nicole Mccann sugar is low !  Sensory : loss of vibration in toes. Tingling and pin and needle dyeasthesias.  Coordination: Rapid alternating movements normal in all extremities.  Finger-to-nose  performed accurately bilaterally. Gait and Station: Arises from chair without difficulty. Stance is wide-based, ambulates with a single-point cane   Reflexes: 2+ and symmetric. Toes downgoing.     ASSESSMENT:   1)Seizures with Olfactory aura which have responded to Lamictal, no longer having Auras- she felt safe to discontinue the medication- I have voiced my reservation- by now 2 month off meds.  Marland Kitchen 2)OSA on CPAP: she had a successful pressure adjustment and has no longer headaches,  the patient is continuously using an auto CPAP between 4 and 12 cm water allowing for a residual AHI of only 1.4    Nicole Mccann compliance was 97% for 30 days with 5 hours and 56 minutes of nightly use.    The EPR level 3 cm water she does have a moderate air leak. She feels that after 4 hours she wakes up with a sudden higher pressure, perhaps due to leak? 3)  obesity - unchanged, she is willing to reduce Nicole Mccann carbohydrates, weight watcher discussed  4) diabetes poorly controlled- and may cause vision loss, changes in acuity  , too. Benefit from low carb would be seen here ,too.  5) depression treated with Zoloft by Dr. Madaline Guthrie . 6) gait disorder, uses a cane, last fall was 6 weeks ago- broke Nicole Mccann little finger on the right hand. Back pain, in PT. She will see a P-Trainer at the [facility], and continue with water exercise.     PLAN:  30 minutes RV with new MOCA, no evidence of MCI or dementia.   Lamictal to 100 mg XR  1 tab daily,  Was discontinued- she has been doing well without medication for 2.5 month- If she has not longer olfactory auras, and they not recurring, she can stay off. She is allowed driving after another 3 month. .   From now on yearly CPAP revisit , will  schedule with Nicole Mccann Mccann same day .   BMI and diabetes control are related, the patient is the main cook in Nicole Mccann household and has control over the menu.  Fall prevention information given and urged to consider PT .  Followup yearly and when necessary.

## 2016-06-07 ENCOUNTER — Telehealth: Payer: Self-pay | Admitting: Family Medicine

## 2016-06-07 ENCOUNTER — Emergency Department (HOSPITAL_BASED_OUTPATIENT_CLINIC_OR_DEPARTMENT_OTHER)
Admission: EM | Admit: 2016-06-07 | Discharge: 2016-06-07 | Disposition: A | Discharge status: Home / Self Care | Payer: Medicare Other | Attending: Emergency Medicine | Admitting: Emergency Medicine

## 2016-06-07 ENCOUNTER — Encounter (HOSPITAL_BASED_OUTPATIENT_CLINIC_OR_DEPARTMENT_OTHER): Payer: Self-pay | Admitting: *Deleted

## 2016-06-07 DIAGNOSIS — J45909 Unspecified asthma, uncomplicated: Secondary | ICD-10-CM | POA: Diagnosis not present

## 2016-06-07 DIAGNOSIS — L03114 Cellulitis of left upper limb: Secondary | ICD-10-CM | POA: Insufficient documentation

## 2016-06-07 DIAGNOSIS — E119 Type 2 diabetes mellitus without complications: Secondary | ICD-10-CM | POA: Insufficient documentation

## 2016-06-07 DIAGNOSIS — L02414 Cutaneous abscess of left upper limb: Secondary | ICD-10-CM | POA: Diagnosis present

## 2016-06-07 LAB — CBG MONITORING, ED: Glucose-Capillary: 88 mg/dL (ref 65–99)

## 2016-06-07 MED ORDER — CLINDAMYCIN HCL 300 MG PO CAPS: 300.0000 mg | ORAL_CAPSULE | Freq: Three times a day (TID) | ORAL | 0 refills | Status: DC

## 2016-06-07 MED ORDER — CLINDAMYCIN HCL 150 MG PO CAPS
300.0000 mg | ORAL_CAPSULE | Freq: Once | ORAL | Status: AC
  Administered 2016-06-07: 300 mg via ORAL
  Filled 2016-06-07: qty 2

## 2016-06-07 MED FILL — CLINDAMYCIN HCL 300 MG CAPS: 300 | 7 days supply | Qty: 21 | Fill #0

## 2016-06-07 NOTE — ED Notes (Signed)
MD at bedside. 

## 2016-06-07 NOTE — Telephone Encounter (Signed)
Spoke with patient and gave her a script for a walker.

## 2016-06-07 NOTE — Discharge Instructions (Signed)
Take clindamycin 300 mg three times daily for a week.   You may have more redness for 1-2 days before it gets better.   Take benadryl 25 mg every 6 hrs as needed for itchiness.   See your doctor  Return to ER if you have fevers, worse redness, purulent discharge from the wound

## 2016-06-07 NOTE — ED Provider Notes (Signed)
[facility] Provider Note   CSN: [id number] Arrival date & time: 06/07/16  1406  First Provider Contact:  First MD Initiated Contact with Patient 06/07/16 1414        History   Chief Complaint Chief Complaint  Patient presents with  . Abscess    HPI Nicole Mccann is a 70 y.o. female hx of DVT, depression, DM here with Possible skin infection. Patient states that she woke up about 3 days ago and then noticed a possible bug bite on the left upper arm. States that it's been draining clear drainage. Over the last 3 days, she noticed progressive swelling and redness to the left upper arm. Denies any fevers or chills. Patient checked her blood sugar and was 144 this morning and took metformin. She states that she only takes metformin when her blood sugars elevated and does not take it daily. Denies purulent discharge from the wound.    The history is provided by the patient.    Past Medical History:  Diagnosis Date  . Allergy   . Anxiety    Dr Madaline Guthrie  . Asthma   . Atypical seizure (HCC) 06/07/2012  . Depression    Dr Madaline Guthrie  . Diabetes mellitus   . DVT (deep venous thrombosis) (HCC)   . Headache(784.0)   . Hyperlipidemia   . Hypersomnia with sleep apnea, unspecified 05/22/2014  . Olfactory aura   . OSA on CPAP   . Shingles 07/2013    Patient Active Problem List   Diagnosis Date Noted  . Diabetic polyneuropathy associated with diabetes mellitus due to underlying condition (HCC) 06/03/2016  . Low back pain 05/26/2016  . Encounter for medication management 04/15/2015  . Right shoulder pain 04/04/2015  . Adjustment disorder with mixed anxiety and depressed mood 03/14/2015  . Rotator cuff (capsule) sprain 03/13/2015  . Pseudoptosis 11/18/2014  . Seizure disorder, temporal lobe, intractable (HCC) 10/17/2014  . 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 Severe obesity (BMI >= 40) (HCC)  10/17/2014  . OSA on CPAP 10/17/2014  . Hypersomnia with sleep apnea, unspecified 05/22/2014  . Injury of left knee 08/22/2013  . Variants of migraine, not elsewhere classified, without mention of intractable migraine without mention of status migrainosus 05/22/2013  . 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 Spinal stenosis of lumbar region 12/17/2012  . Pulmonary embolism (HCC) 07/09/2012  . DVT (deep venous thrombosis) (HCC) 07/07/2012  . Atypical seizure (HCC) 06/07/2012  . Phlebitis following infusion 06/05/2012  . BENIGN POSITIONAL VERTIGO 11/06/2009  . DIABETES MELLITUS, TYPE II 04/04/2009  . ALLERGIC RHINITIS 04/04/2009  . ASTHMA 04/04/2009  . DIVERTICULOSIS, COLON 04/04/2009  . HYPERLIPIDEMIA NEC/NOS 06/29/2007  . DISORDER, DYSMETABOLIC SYNDROME X 06/29/2007    Past Surgical History:  Procedure Laterality Date  . APPENDECTOMY    . CHOLECYSTECTOMY    . COLONOSCOPY  2006   [facility]  . rotator cuff surgery    . TONSILLECTOMY       OB History    No data available       Home Medications    Prior to Admission medications   Medication Sig Start Date End Date Taking? Authorizing Provider  Acetaminophen (TYLENOL EXTRA STRENGTH PO) Take 1 tablet by mouth as needed.    Historical Provider, MD  metFORMIN (GLUCOPHAGE-XR) 750 MG 24 hr tablet 1 tablet daily. Reported on 05/05/2016 05/01/14   Historical Provider, MD    Family History  Family History  Problem Relation Age of Onset  . Stomach cancer Father   . Coronary artery disease Father   . Breast cancer Sister     breast cancer  . Diabetes Maternal Aunt   . Diabetes Paternal Aunt   . Prostate cancer Brother   . Stroke Neg Hx     Social History Social History  Substance Use Topics  . Smoking status: Never Smoker  . Smokeless tobacco: Never Used  . Alcohol use 0.0 oz/week     Comment: occ     Allergies   Mushroom ext cmplx(shiitake-reishi-mait); Shellfish allergy; Sulfonamide derivatives;  Iodinated diagnostic agents; Acetaminophen; Butalbital-aspirin-caffeine; Clarithromycin; Fluticasone-salmeterol; Montelukast sodium; Moxifloxacin; Orphenadrine; Orphenadrine citrate; and Penicillins   Review of Systems Review of Systems  Skin: Positive for rash.  All other systems reviewed and are negative.    Physical Exam Updated Vital Signs BP 152/75   Pulse 72   Temp 97.9 F (36.6 C) (Oral)   Resp 16   Ht 4\' 11"  (1.499 m)   Wt 266 lb (120.7 kg)   SpO2 99%   BMI 53.73 kg/m   Physical Exam  Constitutional: She is oriented to person, place, and time. She appears well-developed and well-nourished.  HENT:  Head: Normocephalic.  Eyes: EOM are normal. Pupils are equal, round, and reactive to light.  Neck: Normal range of motion.  Cardiovascular: Normal rate and regular rhythm.   Pulmonary/Chest: Effort normal and breath sounds normal. No respiratory distress. She has no wheezes. She has no rales.  Abdominal: Soft. Bowel sounds are normal. She exhibits no distension. There is no tenderness. There is no guarding.  Musculoskeletal: Normal range of motion.  Neurological: She is alert and oriented to person, place, and time.  Skin: There is erythema.  L upper arm with area of erythema, clear serosanguinous drainage. No obvious purulent discharge, no fluctuance   Psychiatric: She has a normal mood and affect.  Nursing note and vitals reviewed.      ED Treatments / Results  Labs (all labs ordered are listed, but only abnormal results are displayed) Labs Reviewed  CBG MONITORING, ED    EKG  EKG Interpretation None       Radiology No results found.  Procedures Procedures (including critical care time)  EMERGENCY DEPARTMENT US SOFT TISSUE INTERPRETATION "Study: Limited Ultrasound of the noted body part in comments below"  INDICATIONS: Soft tissue infection Multiple views of the body part are obtained with a multi-frequency linear probe  PERFORMED BY:   Myself  IMAGES ARCHIVED?: Yes  SIDE:Left  BODY PART:Upper extremity  FINDINGS: No abcess noted and Cellulitis present  LIMITATIONS:  Body Habitus  INTERPRETATION:  Cellulitis present  COMMENT:  No abscess     Medications Ordered in ED Medications  clindamycin (CLEOCIN) capsule 300 mg (not administered)     Initial Impression / Assessment and Plan / ED Course  I have reviewed the triage vital signs and the nursing notes.  Pertinent labs & imaging results that were available during my care of the patient were reviewed by me and considered in my medical decision making (see chart for details).  Clinical Course    Nicole Mccann is a 70 y.o. female here with L upper arm redness and swelling. Afebrile, well appearing. Thought it was a bug bite. Bedside US confirmed mild cellulitis, no abscess. CBG was 88. Will give clindamycin empirically. Gave her strict return precautions.    Final Clinical Impressions(s) / ED Diagnoses   Final diagnoses:  None    New Prescriptions New Prescriptions   No medications on file     Charlynne Pander, MD 06/07/16 1452

## 2016-06-07 NOTE — Telephone Encounter (Signed)
She was also instructed to check with PCP regarding handicap placard - we only give temporary ones.

## 2016-06-07 NOTE — ED Triage Notes (Signed)
She woke with pain to his left upper arm. Red, swollen, painful with clear drainage x 3 days.

## 2016-06-08 ENCOUNTER — Telehealth: Payer: Self-pay | Admitting: Family Medicine

## 2016-06-08 ENCOUNTER — Ambulatory Visit: Payer: Medicare Other | Attending: Family Medicine | Admitting: Physical Therapy

## 2016-06-08 DIAGNOSIS — M6281 Muscle weakness (generalized): Secondary | ICD-10-CM | POA: Diagnosis present

## 2016-06-08 DIAGNOSIS — M5441 Lumbago with sciatica, right side: Secondary | ICD-10-CM | POA: Insufficient documentation

## 2016-06-08 DIAGNOSIS — R2689 Other abnormalities of gait and mobility: Secondary | ICD-10-CM | POA: Diagnosis present

## 2016-06-08 DIAGNOSIS — M5442 Lumbago with sciatica, left side: Secondary | ICD-10-CM | POA: Insufficient documentation

## 2016-06-08 NOTE — Patient Instructions (Signed)
Knee to Chest (Flexion)    Pull knee toward chest. Feel stretch in lower back or buttock area. Breathing deeply, Hold __20-30__ seconds. Repeat with other knee. Repeat __2-3__ times. Do __2-3__ sessions per day.  Use towel or sheet around thigh to help with stretch.  http://gt2.exer.us/226   Copyright  VHI. All rights reserved.    Hamstring Step 2    Left foot relaxed, knee straight, other leg bent, foot flat. Raise straight leg further upward to maximal range. Hold _20-30__ seconds. Relax leg completely down.  Repeat with other side. Repeat _2-3__ times.  Do 2-3 sessions per day.  Copyright  VHI. All rights reserved.

## 2016-06-08 NOTE — Telephone Encounter (Signed)
Pt dropped off document to be filled out (Disability Parking Placard). Put in [facility].

## 2016-06-08 NOTE — Therapy (Signed)
[facility] [address]  [address] [place], [place], [address] Phone: [phone]   Fax:  [phone]  Physical Therapy Evaluation  Patient Details  Name: Nicole Mccann MRN: [id number] Date of Birth: 02/17/1946 Referring Provider: Dr. Norton Blizzard  Encounter Date: 06/08/2016      PT End of Session - 06/08/16 1428    Visit Number 1   Number of Visits 12   Date for PT Re-Evaluation 07/20/16   PT Start Time 1345   PT Stop Time 1422   PT Time Calculation (min) 37 min   Activity Tolerance Patient tolerated treatment well;Patient limited by pain   Behavior During Therapy [facility] for tasks assessed/performed      Past Medical History:  Diagnosis Date  . Allergy   . Anxiety    Dr Madaline Guthrie  . Asthma   . Atypical seizure (HCC) 06/07/2012  . Depression    Dr Madaline Guthrie  . Diabetes mellitus   . DVT (deep venous thrombosis) (HCC)   . Headache(784.0)   . Hyperlipidemia   . Hypersomnia with sleep apnea, unspecified 05/22/2014  . Olfactory aura   . OSA on CPAP   . Shingles 07/2013    Past Surgical History:  Procedure Laterality Date  . APPENDECTOMY    . CHOLECYSTECTOMY    . COLONOSCOPY  2006   [facility]  . rotator cuff surgery    . TONSILLECTOMY       There were no vitals filed for this visit.       Subjective Assessment - 06/08/16 1349    Subjective Pt is a 70 y/o female who presents to OPPT for low back pain with pain radiating down bil LEs.  Pt reports pain x 3 months at least.  Pt with hx of LBP with injections.    Pertinent History moderate spinal stenosis, anxiety, seizures, depression, diabetes   Limitations Standing;Walking;Sitting   How long can you sit comfortably? 4-5 hours   How long can you stand comfortably? 30 min   How long can you walk comfortably? < 5 min   Patient Stated Goals improve pain   Currently in Pain? Yes   Pain Score 8    Pain Location Back   Pain Orientation Lower   Pain  Descriptors / Indicators Pressure   Pain Type Chronic pain   Pain Radiating Towards bil LEs   Pain Onset More than a month ago   Pain Frequency Constant   Aggravating Factors  standing, walking, returning to upright after bending   Pain Relieving Factors repositioning            [facility] PT Assessment - 06/08/16 1355      Assessment   Medical Diagnosis LBP with sciatica   Referring Provider Dr. Norton Blizzard   Onset Date/Surgical Date --  3 months   Next MD Visit 06/29/16   Prior Therapy in past for knee and shoulder     Precautions   Precautions None     Restrictions   Weight Bearing Restrictions No     Balance Screen   Has the patient fallen in the past 6 months No   Has the patient had a decrease in activity level because of a fear of falling?  Yes   Is the patient reluctant to leave their home because of a fear of falling?  No     Home [employer] information [employer] residence   Living Arrangements Spouse/significant other   Available  Help at Discharge Family   Type of Home House   Home Access Level entry  from garage   Home Layout Two level  split   Alternate Level Stairs-Number of Steps 6   Alternate Level Stairs-Rails Can reach both     Prior Function   Level of Independence Independent   Vocation Retired   Leisure just started at [employer]   Overall Cognitive Status Within Functional Limits for tasks assessed     Observation/Other Assessments   Focus on Therapeutic Outcomes (FOTO)  29 (71% limited; predicted 47% limited)     Posture/Postural Control   Posture/Postural Control Postural limitations   Postural Limitations Rounded Shoulders;Forward head;Decreased lumbar lordosis     AROM   AROM Assessment Site Lumbar   Lumbar Flexion 50   Lumbar Extension 10   Lumbar - Right Side Bend 22   Lumbar - Left Side Bend 15     Strength   Overall Strength Comments only tested in sitting due to decreased functional mobility  and pain; suspect weakness in other muscle groups due to inability to rise without UE support and gait deviations   Right Hip Flexion 4/5   Left Hip Flexion 4/5   Right Knee Extension 4/5   Left Knee Extension 4/5   Right Ankle Dorsiflexion 4/5   Left Ankle Dorsiflexion 4/5     Palpation   Palpation comment no significant tenderness; pain along SIJ bil     Special Tests    Special Tests Lumbar   Lumbar Tests Straight Leg Raise;other     Straight Leg Raise   Findings Negative   Comment pain decreased with SLR     other   Comments pain decreased with knee to chest     Ambulation/Gait   Gait Pattern Trendelenburg;Lateral hip instability                   [facility] Adult PT Treatment/Exercise - 06/08/16 1355      Exercises   Exercises Lumbar     Lumbar Exercises: Stretches   Passive Hamstring Stretch 1 rep;30 seconds   Passive Hamstring Stretch Limitations supine with strap   Single Knee to Chest Stretch 1 rep;30 seconds   Single Knee to Chest Stretch Limitations bil; instructed in use of towel                PT Education - 06/08/16 1427    Education provided Yes   Education Details clinical findings, HEP   Person(s) Educated Patient   Methods Explanation;Demonstration;Handout   Comprehension Verbalized understanding;Returned demonstration;Need further instruction          PT Short Term Goals - 06/08/16 1434      PT SHORT TERM GOAL #1   Title n/a           PT Long Term Goals - 06/08/16 1430      PT LONG TERM GOAL #1   Title independent with HEP (07/20/16)   Time 6   Period Weeks   Status New     PT LONG TERM GOAL #2   Title negotiate 6 steps without increase in pain (07/20/16)   Time 6   Period Weeks   Status New     PT LONG TERM GOAL #3   Title ambulate > 10 min without increase in pain for improved strength and function (07/20/16)   Time 6   Period Weeks   Status New  Plan - Jun 27, 2016 1428    Clinical  Impression Statement Pt is a 70 y/o female who presents to OPPT for LBP radiating down bil LEs.  Pt demonstrates postural abnormalities, gait deviations, decreased strength and ROM affecting mobility.  Will benefit from PT to maximize function.   Rehab Potential Good   PT Frequency 2x / week   PT Duration 6 weeks   PT Treatment/Interventions ADLs/Self Care Home Management;Cryotherapy;Moist Heat;Ultrasound;Traction;Neuromuscular re-education;Therapeutic exercise;Therapeutic activities;Functional mobility training;Gait training;Stair training;Patient/family education;Manual techniques;Taping   PT Next Visit Plan review HEP; hip flexor and piriformis stretch; core stabilization   Consulted and Agree with Plan of Care Patient      Patient will benefit from skilled therapeutic intervention in order to improve the following deficits and impairments:  Pain, Decreased strength, Decreased activity tolerance, Decreased mobility, Difficulty walking, Postural dysfunction, Decreased range of motion  Visit Diagnosis: Bilateral low back pain with sciatica, sciatica laterality unspecified - Plan: PT plan of care cert/re-cert  Muscle weakness (generalized) - Plan: PT plan of care cert/re-cert  Other abnormalities of gait and mobility - Plan: PT plan of care cert/re-cert      G-Codes - 27-Jun-2016 1435    Functional Assessment Tool Used FOTO   Functional Limitation Mobility: Walking and moving around   Mobility: Walking and Moving Around Current Status [phone]) At least 60 percent but less than 80 percent impaired, limited or restricted   Mobility: Walking and Moving Around Goal Status [phone]) At least 40 percent but less than 60 percent impaired, limited or restricted       Problem List Patient Active Problem List   Diagnosis Date Noted  . Diabetic polyneuropathy associated with diabetes mellitus due to underlying condition (HCC) 06/03/2016  . Low back pain 05/26/2016  . Encounter for medication  management 04/15/2015  . Right shoulder pain 04/04/2015  . Adjustment disorder with mixed anxiety and depressed mood 03/14/2015  . Rotator cuff (capsule) sprain 03/13/2015  . Pseudoptosis 11/18/2014  . Seizure disorder, temporal lobe, intractable (HCC) 10/17/2014  . 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 Severe obesity (BMI >= 40) (HCC) 10/17/2014  . OSA on CPAP 10/17/2014  . Hypersomnia with sleep apnea, unspecified 05/22/2014  . Injury of left knee 08/22/2013  . Variants of migraine, not elsewhere classified, without mention of intractable migraine without mention of status migrainosus 05/22/2013  . 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 Spinal stenosis of lumbar region 12/17/2012  . Pulmonary embolism (HCC) 07/09/2012  . DVT (deep venous thrombosis) (HCC) 07/07/2012  . Atypical seizure (HCC) 06/07/2012  . Phlebitis following infusion 06/05/2012  . BENIGN POSITIONAL VERTIGO 11/06/2009  . DIABETES MELLITUS, TYPE II 04/04/2009  . ALLERGIC RHINITIS 04/04/2009  . ASTHMA 04/04/2009  . DIVERTICULOSIS, COLON 04/04/2009  . HYPERLIPIDEMIA NEC/NOS 06/29/2007  . DISORDER, DYSMETABOLIC SYNDROME X 06/29/2007      Clarita Crane, PT, DPT 06-27-16 2:38 PM   [facility] [address]  [address] [place], [place], [address] Phone: [phone]   Fax:  [phone]  Name: Nicole Mccann MRN: [id number] Date of Birth: [birth year]

## 2016-06-10 ENCOUNTER — Telehealth: Payer: Self-pay

## 2016-06-10 NOTE — Telephone Encounter (Signed)
Called patient to inform her her Parking Placard has been completed. Asked if she wanted [place] to mail to her or if she wanted to pick it up. Patient states she will pick up. Left at front desk for pick up.

## 2016-06-11 ENCOUNTER — Ambulatory Visit: Payer: Medicare Other

## 2016-06-11 DIAGNOSIS — R2689 Other abnormalities of gait and mobility: Secondary | ICD-10-CM

## 2016-06-11 DIAGNOSIS — M5441 Lumbago with sciatica, right side: Secondary | ICD-10-CM

## 2016-06-11 DIAGNOSIS — M5442 Lumbago with sciatica, left side: Principal | ICD-10-CM

## 2016-06-11 DIAGNOSIS — M6281 Muscle weakness (generalized): Secondary | ICD-10-CM

## 2016-06-11 NOTE — Therapy (Signed)
[facility] [address]  [address] [place], [place], [address] Phone: [phone]   Fax:  [phone]  Physical Therapy Treatment  Patient Details  Name: Nicole Mccann MRN: [id number] Date of Birth: 1946-10-05 Referring Provider: Dr. Norton Blizzard  Encounter Date: 06/11/2016      PT End of Session - 06/11/16 0819    Visit Number 2   Number of Visits 12   Date for PT Re-Evaluation 07/20/16   PT Start Time 0805   PT Stop Time 0845   PT Time Calculation (min) 40 min   Activity Tolerance Patient tolerated treatment well;Patient limited by pain   Behavior During Therapy [facility] for tasks assessed/performed      Past Medical History:  Diagnosis Date  . Allergy   . Anxiety    Dr Madaline Guthrie  . Asthma   . Atypical seizure (HCC) 06/07/2012  . Depression    Dr Madaline Guthrie  . Diabetes mellitus   . DVT (deep venous thrombosis) (HCC)   . Headache(784.0)   . Hyperlipidemia   . Hypersomnia with sleep apnea, unspecified 05/22/2014  . Olfactory aura   . OSA on CPAP   . Shingles 07/2013    Past Surgical History:  Procedure Laterality Date  . APPENDECTOMY    . CHOLECYSTECTOMY    . COLONOSCOPY  2006   [facility]  . rotator cuff surgery    . TONSILLECTOMY       There were no vitals filed for this visit.      Subjective Assessment - 06/11/16 0815    Subjective Pt. reports she has been able to perform the HEP every day since evaluation and feels relief when she does this.     Patient Stated Goals improve pain   Currently in Pain? Yes   Pain Score 9    Pain Location Back   Pain Orientation Lower   Pain Descriptors / Indicators Pressure   Pain Type Chronic pain   Pain Onset More than a month ago   Pain Frequency Constant   Multiple Pain Sites No      Today's Treatment:  Therex (pt. required increased time for all therex due to LBP with all transitional movements): NuStep: level 1, 8 min  B HS, piriformis, glute, RF (in  mod thomas), SKTC stretch 2 x 30" Hooklying abdominal bracing 5" x 10 reps Hooklying alternating knee fall out  5" x 10 reps   Manual: TPR to L RF; just superior to L knee, and central to RF; strong indication for TP presence STM to L quad with progressive increase in RF stretch with foot on 12" >10" step  HEP review: B HS stretch x 30 sec B SKTC x 30 sec         PT Short Term Goals - 06/08/16 1434      PT SHORT TERM GOAL #1   Title n/a           PT Long Term Goals - 06/11/16 0845      PT LONG TERM GOAL #1   Title independent with HEP (07/20/16)   Time 6   Period Weeks   Status On-going     PT LONG TERM GOAL #2   Title negotiate 6 steps without increase in pain (07/20/16)   Time 6   Period Weeks   Status On-going     PT LONG TERM GOAL #3   Title ambulate > 10 min without increase in pain for improved  strength and function (07/20/16)   Time 6   Period Weeks   Status On-going               Plan - 06/11/16 3086    Clinical Impression Statement Pt. reporting that she has a 9/10 initial LBP and was able to perform the HEP every day since evaluation; pt. with LBP decrease to 4/10 following today's treatment of Passive LE/hip stretching and STM/TPR to L RF.  Pt. with good HEP recall and technque and very cooperative with therapy today.     PT Treatment/Interventions ADLs/Self Care Home Management;Cryotherapy;Moist Heat;Ultrasound;Traction;Neuromuscular re-education;Therapeutic exercise;Therapeutic activities;Functional mobility training;Gait training;Stair training;Patient/family education;Manual techniques;Taping   PT Next Visit Plan Hip flexor and piriformis stretch; core stabilization      Patient will benefit from skilled therapeutic intervention in order to improve the following deficits and impairments:  Pain, Decreased strength, Decreased activity tolerance, Decreased mobility, Difficulty walking, Postural dysfunction, Decreased range of motion  Visit  Diagnosis: Bilateral low back pain with sciatica, sciatica laterality unspecified  Muscle weakness (generalized)  Other abnormalities of gait and mobility     Problem List Patient Active Problem List   Diagnosis Date Noted  . Diabetic polyneuropathy associated with diabetes mellitus due to underlying condition (HCC) 06/03/2016  . Low back pain 05/26/2016  . Encounter for medication management 04/15/2015  . Right shoulder pain 04/04/2015  . Adjustment disorder with mixed anxiety and depressed mood 03/14/2015  . Rotator cuff (capsule) sprain 03/13/2015  . Pseudoptosis 11/18/2014  . Seizure disorder, temporal lobe, intractable (HCC) 10/17/2014  . 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 Severe obesity (BMI >= 40) (HCC) 10/17/2014  . OSA on CPAP 10/17/2014  . Hypersomnia with sleep apnea, unspecified 05/22/2014  . Injury of left knee 08/22/2013  . Variants of migraine, not elsewhere classified, without mention of intractable migraine without mention of status migrainosus 05/22/2013  . 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 Spinal stenosis of lumbar region 12/17/2012  . Pulmonary embolism (HCC) 07/09/2012  . DVT (deep venous thrombosis) (HCC) 07/07/2012  . Atypical seizure (HCC) 06/07/2012  . Phlebitis following infusion 06/05/2012  . BENIGN POSITIONAL VERTIGO 11/06/2009  . DIABETES MELLITUS, TYPE II 04/04/2009  . ALLERGIC RHINITIS 04/04/2009  . ASTHMA 04/04/2009  . DIVERTICULOSIS, COLON 04/04/2009  . HYPERLIPIDEMIA NEC/NOS 06/29/2007  . DISORDER, DYSMETABOLIC SYNDROME X 06/29/2007    Kermit Balo, PTA 06/11/2016, 9:30 AM  [facility] [address]  [address] [place], [place], [address] Phone: [phone]   Fax:  [phone]  Name: Nicole Mccann MRN: [id number] Date of Birth: Mar 04, 1946

## 2016-06-14 ENCOUNTER — Ambulatory Visit: Payer: Medicare Other

## 2016-06-14 DIAGNOSIS — M5442 Lumbago with sciatica, left side: Principal | ICD-10-CM

## 2016-06-14 DIAGNOSIS — R2689 Other abnormalities of gait and mobility: Secondary | ICD-10-CM

## 2016-06-14 DIAGNOSIS — M5441 Lumbago with sciatica, right side: Secondary | ICD-10-CM | POA: Diagnosis not present

## 2016-06-14 DIAGNOSIS — M6281 Muscle weakness (generalized): Secondary | ICD-10-CM

## 2016-06-14 NOTE — Therapy (Signed)
[facility] [address]  [address] [place], [place], [address] Phone: [phone]   Fax:  [phone]  Physical Therapy Treatment  Patient Details  Name: Nicole Mccann MRN: [id number] Date of Birth: 07/21/1946 Referring Provider: Dr. Norton Blizzard  Encounter Date: 06/14/2016      PT End of Session - 06/14/16 0835    Visit Number 3   Number of Visits 12   Date for PT Re-Evaluation 07/20/16   PT Start Time 0817  Pt. arrived late to therapy today.    PT Stop Time [phone]   PT Time Calculation (min) 25 min   Activity Tolerance Patient tolerated treatment well;Patient limited by pain   Behavior During Therapy [facility] for tasks assessed/performed      Past Medical History:  Diagnosis Date  . Allergy   . Anxiety    Dr Madaline Guthrie  . Asthma   . Atypical seizure (HCC) 06/07/2012  . Depression    Dr Madaline Guthrie  . Diabetes mellitus   . DVT (deep venous thrombosis) (HCC)   . Headache(784.0)   . Hyperlipidemia   . Hypersomnia with sleep apnea, unspecified 05/22/2014  . Olfactory aura   . OSA on CPAP   . Shingles 07/2013    Past Surgical History:  Procedure Laterality Date  . APPENDECTOMY    . CHOLECYSTECTOMY    . COLONOSCOPY  2006   [facility]  . rotator cuff surgery    . TONSILLECTOMY       There were no vitals filed for this visit.      Subjective Assessment - 06/14/16 0832    Subjective Pt. reports she felt better following treatment Friday however reports that her LBP increased Saturday and got very bad Sunday and this morning.     Patient Stated Goals improve pain   Currently in Pain? Yes   Pain Score 10-Worst pain ever   Pain Location Back   Pain Orientation Lower   Pain Descriptors / Indicators Aching   Pain Type Chronic pain   Pain Onset More than a month ago   Pain Frequency Constant   Aggravating Factors  transitional movements   Multiple Pain Sites No       Today's Treatment:  * Pt. seen initially  very flared up with increased LBP above baseline 10/10  Modalities: E-stim. To lumbar spine: hooklying, 80-150 Hz, intensity to pt. Tolerance, 15 min  Moist heat to lumbar spine: hooklying, 15 min         PT Short Term Goals - 06/08/16 1434      PT SHORT TERM GOAL #1   Title n/a           PT Long Term Goals - 06/11/16 0845      PT LONG TERM GOAL #1   Title independent with HEP (07/20/16)   Time 6   Period Weeks   Status On-going     PT LONG TERM GOAL #2   Title negotiate 6 steps without increase in pain (07/20/16)   Time 6   Period Weeks   Status On-going     PT LONG TERM GOAL #3   Title ambulate > 10 min without increase in pain for improved strength and function (07/20/16)   Time 6   Period Weeks   Status On-going               Plan - 06/14/16 9528    Clinical Impression Statement Pt. seen today initially with 10/10  LBP flare up with no identifiable trigger.  Pt. reports the pain has steadily increased since late Saturday night until this morning.  today's treatment focused on relieving pt. LBP to reduce muscular guarding; no therex was attempted due to time constraints and pt. arriving 17 min late to therapy.  Pt. able to leave treatment today with 5/10 pain following application of moist heat / E-stim combo.  Pt. instructed to identify LBP trigger for future avoidance.     PT Treatment/Interventions ADLs/Self Care Home Management;Cryotherapy;Moist Heat;Ultrasound;Traction;Neuromuscular re-education;Therapeutic exercise;Therapeutic activities;Functional mobility training;Gait training;Stair training;Patient/family education;Manual techniques;Taping   PT Next Visit Plan Hip flexor and piriformis stretch; core stabilization      Patient will benefit from skilled therapeutic intervention in order to improve the following deficits and impairments:  Pain, Decreased strength, Decreased activity tolerance, Decreased mobility, Difficulty walking, Postural dysfunction,  Decreased range of motion  Visit Diagnosis: Bilateral low back pain with sciatica, sciatica laterality unspecified  Muscle weakness (generalized)  Other abnormalities of gait and mobility     Problem List Patient Active Problem List   Diagnosis Date Noted  . Diabetic polyneuropathy associated with diabetes mellitus due to underlying condition (HCC) 06/03/2016  . Low back pain 05/26/2016  . Encounter for medication management 04/15/2015  . Right shoulder pain 04/04/2015  . Adjustment disorder with mixed anxiety and depressed mood 03/14/2015  . Rotator cuff (capsule) sprain 03/13/2015  . Pseudoptosis 11/18/2014  . Seizure disorder, temporal lobe, intractable (HCC) 10/17/2014  . 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 Severe obesity (BMI >= 40) (HCC) 10/17/2014  . OSA on CPAP 10/17/2014  . Hypersomnia with sleep apnea, unspecified 05/22/2014  . Injury of left knee 08/22/2013  . Variants of migraine, not elsewhere classified, without mention of intractable migraine without mention of status migrainosus 05/22/2013  . 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 Spinal stenosis of lumbar region 12/17/2012  . Pulmonary embolism (HCC) 07/09/2012  . DVT (deep venous thrombosis) (HCC) 07/07/2012  . Atypical seizure (HCC) 06/07/2012  . Phlebitis following infusion 06/05/2012  . BENIGN POSITIONAL VERTIGO 11/06/2009  . DIABETES MELLITUS, TYPE II 04/04/2009  . ALLERGIC RHINITIS 04/04/2009  . ASTHMA 04/04/2009  . DIVERTICULOSIS, COLON 04/04/2009  . HYPERLIPIDEMIA NEC/NOS 06/29/2007  . DISORDER, DYSMETABOLIC SYNDROME X 06/29/2007    Kermit Balo, PTA 06/14/2016, 12:57 PM  [facility] [address]  [address] [place], [place], [address] Phone: [phone]   Fax:  [phone]  Name: Nicole Mccann MRN: [id number] Date of Birth: 16-Oct-1946

## 2016-06-16 ENCOUNTER — Ambulatory Visit: Payer: Medicare Other

## 2016-06-16 DIAGNOSIS — M5442 Lumbago with sciatica, left side: Principal | ICD-10-CM

## 2016-06-16 DIAGNOSIS — M5441 Lumbago with sciatica, right side: Secondary | ICD-10-CM

## 2016-06-16 DIAGNOSIS — R2689 Other abnormalities of gait and mobility: Secondary | ICD-10-CM

## 2016-06-16 DIAGNOSIS — M6281 Muscle weakness (generalized): Secondary | ICD-10-CM

## 2016-06-16 NOTE — Therapy (Addendum)
[facility] [address]  [place], [place], [address] Phone: [phone]   Fax:  [phone]  Physical Therapy Treatment  Patient Details  Name: Nicole Mccann MRN: [id number] Date of Birth: 07-19-1946 Referring Provider: Dr. Karlton Lemon  Encounter Date: 06/16/2016      PT End of Session - 06/16/16 1602    Visit Number 4   Number of Visits 12   Date for PT Re-Evaluation 07/20/16   PT Start Time 1339  pt. arrived late to therapy   PT Stop Time 1417   PT Time Calculation (min) 38 min   Activity Tolerance Patient tolerated treatment well;Patient limited by pain   Behavior During Therapy [facility] for tasks assessed/performed      Past Medical History:  Diagnosis Date  . Allergy   . Anxiety    Dr Albertine Patricia  . Asthma   . Atypical seizure ([place]) 06/07/2012  . Depression    Dr Albertine Patricia  . Diabetes mellitus   . DVT (deep venous thrombosis) ([place])   . Headache(784.0)   . Hyperlipidemia   . Hypersomnia with sleep apnea, unspecified 05/22/2014  . Olfactory aura   . OSA on CPAP   . Shingles 07/2013    Past Surgical History:  Procedure Laterality Date  . APPENDECTOMY    . CHOLECYSTECTOMY    . COLONOSCOPY  2006   [facility]  . rotator cuff surgery    . TONSILLECTOMY       There were no vitals filed for this visit.      Subjective Assessment - 06/16/16 1559    Subjective Pt. seen initially today with 10/10 LBP; pt. with home TENS machine today.     Patient Stated Goals improve pain   Currently in Pain? Yes   Pain Score 0-No pain   Pain Location Back   Pain Orientation Lower   Pain Descriptors / Indicators Stabbing;Sharp   Pain Type Chronic pain   Pain Radiating Towards L LE    Pain Onset More than a month ago   Multiple Pain Sites No        Today's Treatment:  * Pt. seen initially very flared up with increased LBP above baseline to 10/10. * pt. required increased time with all therex and  transitional movements due to LBP today.  Therex: NuStep: level 3, 8 min   Modalities: E-stim. To lumbar spine: hooklying, 80-150 Hz, intensity to pt. Tolerance, 15 min  Moist heat to lumbar spine: hooklying, 15 min  Therex (performed in combo with E-stim and moist heat): B piriformis stretch x 30 sec each  B HS, SKTC, glute stretch x 30 sec each         PT Education - 06/16/16 1827    Education provided Yes   Education Details Education and instruction on home TENS unit; pt. instructed to use home TENS unit on (Back, massage, and intensity to her tolerance)           PT Short Term Goals - 06/08/16 1434      PT SHORT TERM GOAL #1   Title n/a           PT Long Term Goals - 06/11/16 0845      PT LONG TERM GOAL #1   Title independent with HEP (07/20/16)   Time 6   Period Weeks   Status On-going     PT LONG TERM GOAL #2   Title negotiate 6 steps without increase in  pain (07/20/16)   Time 6   Period Weeks   Status On-going     PT LONG TERM GOAL #3   Title ambulate > 10 min without increase in pain for improved strength and function (07/20/16)   Time 6   Period Weeks   Status On-going               Plan - 06/16/16 1821    Clinical Impression Statement Pt. seen initially today with 10/10 LBP; pt. with home TENS machine today.  Time taken today to instruct pt. on proper use of home TENS unit; pt. verbalized understanding.  Today's treatment focused on passive LE stretching with simultaneous moist heat and E-stim combo application to lumbar spine; Pt. had very good pain relief with this and 5/10 reported back pain with passive stretching.  Pt. still has the most pain with transitional movements (sit<>stand).     PT Treatment/Interventions ADLs/Self Care Home Management;Cryotherapy;Moist Heat;Ultrasound;Traction;Neuromuscular re-education;Therapeutic exercise;Therapeutic activities;Functional mobility training;Gait training;Stair training;Patient/family  education;Manual techniques;Taping   PT Next Visit Plan Hip flexor and piriformis stretch; core stabilization      Patient will benefit from skilled therapeutic intervention in order to improve the following deficits and impairments:  Pain, Decreased strength, Decreased activity tolerance, Decreased mobility, Difficulty walking, Postural dysfunction, Decreased range of motion  Visit Diagnosis: Bilateral low back pain with sciatica, sciatica laterality unspecified  Muscle weakness (generalized)  Other abnormalities of gait and mobility   Late Entry G code for 06/16/16 visit:  Functional Assessment Tool Used: FOTO/clinical judgement Functional Limitation: Mobility: walking and moving around Mobility Goal Status: At least 40% but less than 60% limited Mobility D/C status:  At least 60% but less than 80% limited    Problem List Patient Active Problem List   Diagnosis Date Noted  . Diabetic polyneuropathy associated with diabetes mellitus due to underlying condition ([place]) 06/03/2016  . Low back pain 05/26/2016  . Encounter for medication management 04/15/2015  . Right shoulder pain 04/04/2015  . Adjustment disorder with mixed anxiety and depressed mood 03/14/2015  . Rotator cuff (capsule) sprain 03/13/2015  . Pseudoptosis 11/18/2014  . Seizure disorder, temporal lobe, intractable ([place]) 10/17/2014  . 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 Neuropathy due to secondary diabetes mellitus ([place]) 10/17/2014  . Severe obesity (BMI >= 40) ([place]) 10/17/2014  . OSA on CPAP 10/17/2014  . Hypersomnia with sleep apnea, unspecified 05/22/2014  . Injury of left knee 08/22/2013  . Variants of migraine, not elsewhere classified, without mention of intractable migraine without mention of status migrainosus 05/22/2013  . 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 Spinal stenosis of lumbar region 12/17/2012  . Pulmonary embolism ([place]) 07/09/2012  . DVT (deep venous thrombosis)  ([place]) 07/07/2012  . Atypical seizure ([place]) 06/07/2012  . Phlebitis following infusion 06/05/2012  . BENIGN POSITIONAL VERTIGO 11/06/2009  . DIABETES MELLITUS, TYPE II 04/04/2009  . ALLERGIC RHINITIS 04/04/2009  . ASTHMA 04/04/2009  . DIVERTICULOSIS, COLON 04/04/2009  . HYPERLIPIDEMIA NEC/NOS 06/29/2007  . DISORDER, DYSMETABOLIC SYNDROME X 93/26/7124    Bess Harvest, PTA 06/16/2016, 6:28 PM   Laureen Abrahams, PT, DPT 07/15/16 12:37 PM   [facility] [address]  [place], [place], [address] Phone: [phone]   Fax:  [phone]  Name: JOLEAH KOSAK MRN: [id number] Date of Birth: 07-22-1946     PHYSICAL THERAPY DISCHARGE SUMMARY  Visits from Start of Care: 4  Current functional level related to goals /  functional outcomes: See above; no goals met   Remaining deficits: Unknown as pt did not return; pt did not tolerate PT   Education / Equipment: HEP  Plan: Patient agrees to discharge.  Patient goals were not met. Patient is being discharged due to the patient's request.  ?????    Laureen Abrahams, PT, DPT 07/15/16 12:35 PM  [facility] at [facility] [place], [place] [address]  [phone] (office) [phone] (fax)

## 2016-06-21 ENCOUNTER — Ambulatory Visit: Payer: Medicare Other

## 2016-06-23 ENCOUNTER — Ambulatory Visit: Payer: Medicare Other

## 2016-06-28 ENCOUNTER — Ambulatory Visit: Payer: Medicare Other

## 2016-06-29 ENCOUNTER — Encounter: Payer: Self-pay | Admitting: Family Medicine

## 2016-06-29 ENCOUNTER — Ambulatory Visit (INDEPENDENT_AMBULATORY_CARE_PROVIDER_SITE_OTHER): Payer: Medicare Other | Admitting: Family Medicine

## 2016-06-29 VITALS — BP 135/75 | HR 76 | Ht 59.0 in | Wt 265.0 lb

## 2016-06-29 DIAGNOSIS — M545 Low back pain: Secondary | ICD-10-CM

## 2016-06-29 MED ORDER — DIAZEPAM 5 MG PO TABS: ORAL_TABLET | ORAL | 0 refills | Status: DC

## 2016-06-29 NOTE — Patient Instructions (Signed)
Take valium as directed for the MRI. I will call you the business day following the MRI to go over results and next steps.

## 2016-06-30 NOTE — Progress Notes (Addendum)
PCP: COPLAND,JESSICA, MD  Subjective:   HPI: Patient is a 70 y.o. female here for low back pain.  7/18: Patient reports she's had worsening low back pain for past 2 months. Describes pain as a pressure, worse when lying in bed and trying to get up. Tried pain patch, icing, tylenol, ibuprofen. Reports the ibuprofen upset her stomach. Some tingling into left toes at times (1st-3rd). No bowel/bladder dysfunction. 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 She notes icing and an inflatable belt help with her pain.  8/22: Patient returns with continued severe pain in low back. Pain level 10/10, sharp. Worse with standing straight, extension. Radiates into left leg. Pain worse on left side of low back moreso than the right. Was using a cane, now using a walker. Improves with bending forward. Difficulty getting out of bed, changing positions. No bowel/bladder dysfunction.doing home exercises, using heating pad, an air belt. Doing water exercises at [facility]. No skin changes, numbness currently.  Past Medical History:  Diagnosis Date  . Allergy   . Anxiety    Dr Madaline Guthrie  . Asthma   . Atypical seizure ([facility]) 06/07/2012  . Depression    Dr Madaline Guthrie  . Diabetes mellitus   . DVT (deep venous thrombosis) ([facility])   . Headache(784.0)   . Hyperlipidemia   . Hypersomnia with sleep apnea, unspecified 05/22/2014  . Olfactory aura   . OSA on CPAP   . Shingles 07/2013    Current Outpatient Prescriptions on File Prior to Visit  Medication Sig Dispense Refill  . Acetaminophen (TYLENOL EXTRA STRENGTH PO) Take 1 tablet by mouth as needed.    . clindamycin (CLEOCIN) 300 MG capsule Take 1 capsule (300 mg total) by mouth 3 (three) times daily. X 7 days 21 capsule 0  . metFORMIN (GLUCOPHAGE-XR) 750 MG 24 hr tablet 1 tablet daily. Reported on 05/05/2016     No current  facility-administered medications on file prior to visit.     Past Surgical History:  Procedure Laterality Date  . APPENDECTOMY    . CHOLECYSTECTOMY    . COLONOSCOPY  2006   [facility]  . rotator cuff surgery    . TONSILLECTOMY       Allergies  Allergen Reactions  . Mushroom Ext Cmplx(Shiitake-Reishi-Mait) Anaphylaxis  . Shellfish Allergy Anaphylaxis  . Sulfonamide Derivatives Anaphylaxis    unknown  . Iodinated Diagnostic Agents     Other reaction(s): Other (See Comments) Bad head ahce  . Acetaminophen     REACTION: STOMACH DISCOMFORT  . Butalbital-Aspirin-Caffeine     unknown  . Clarithromycin Nausea And Vomiting  . Fluticasone-Salmeterol     Feels like something in the throat  . Montelukast Sodium     unknown  . Moxifloxacin     unknown  . Orphenadrine Nausea And Vomiting  . Orphenadrine Citrate     unknown  . Penicillins     unknown    Social History   Social History  . Marital status: Married    Spouse name: Lars Mage  . Number of children: 2  . Years of education: College   Occupational History  . Not on file.   Social History Main Topics  . Smoking status: Never Smoker  . Smokeless tobacco: Never Used  . Alcohol use 0.0 oz/week     Comment: occ  . Drug use: No  . Sexual activity: Not on  file   Other Topics Concern  . Not on file   Social History Narrative   Patient is married Lars Mage(Juan) and lives at home with her husband.   Patient has two adult children.   Patient is retired.   Patient has a college education.   Patient is right-handed.   Patient drinks one cup of tea daily.    Family History  Problem Relation Age of Onset  . Stomach cancer Father   . Coronary artery disease Father   . Breast cancer Sister     breast cancer  . Diabetes Maternal Aunt   . Diabetes Paternal Aunt   . Prostate cancer Brother   . Stroke Neg Hx     BP 135/75   Pulse 76   Ht 4\' 11"  (1.499 m)   Wt 265 lb (120.2 kg)   BMI 53.52 kg/m   Review of  Systems: See HPI above.    Objective:  Physical Exam:  Gen: NAD, comfortable in exam room  Back: No gross deformity, scoliosis. TTP bilateral SI joints.  No midline or bony TTP.  No paraspinal tenderness otherwise FROM with pain on extension only now. Strength LEs 5/5 all muscle groups.   2+ MSRs in patellar and achilles tendons, equal bilaterally. Negative SLRs. Sensation intact to light touch bilaterally. Negative logroll bilateral hips    Assessment & Plan:  1. Low back pain - pain due to spinal stenosis - at least moderate on MRI from 3 1/2 years ago.  Could not tolerate physical therapy due to pain.  Will go ahead with repeat MRI to assess for progression.  Tylenol 1-2 tabs three times a day.  Water aerobics.  Will likely try ESIs again.      Addendum:  MRI reviewed and discussed with patient.  She does appear to have some progression of her spinal stenosis.  She did not tolerate physical therapy.  Not interested in injections at this time.  Will repeat prednisone dose pack, advised her to let us know how she's doing in a week.

## 2016-06-30 NOTE — Assessment & Plan Note (Signed)
pain due to spinal stenosis - at least moderate on MRI from 3 1/2 years ago.  Could not tolerate physical therapy due to pain.  Will go ahead with repeat MRI to assess for progression.  Tylenol 1-2 tabs three times a day.  Water aerobics.  Will likely try ESIs again.

## 2016-07-01 ENCOUNTER — Ambulatory Visit: Payer: Medicare Other

## 2016-07-03 ENCOUNTER — Ambulatory Visit (HOSPITAL_BASED_OUTPATIENT_CLINIC_OR_DEPARTMENT_OTHER)
Admission: RE | Admit: 2016-07-03 | Discharge: 2016-07-03 | Disposition: A | Discharge status: Home / Self Care | Payer: Medicare Other | Source: Ambulatory Visit | Attending: Family Medicine | Admitting: Family Medicine

## 2016-07-03 DIAGNOSIS — M5136 Other intervertebral disc degeneration, lumbar region: Secondary | ICD-10-CM | POA: Insufficient documentation

## 2016-07-03 DIAGNOSIS — M545 Low back pain: Secondary | ICD-10-CM

## 2016-07-03 DIAGNOSIS — M4806 Spinal stenosis, lumbar region: Secondary | ICD-10-CM | POA: Diagnosis not present

## 2016-07-05 ENCOUNTER — Ambulatory Visit: Payer: Medicare Other

## 2016-07-05 MED ORDER — PREDNISONE 10 MG PO TABS: ORAL_TABLET | ORAL | 0 refills | Status: DC

## 2016-07-05 NOTE — Addendum Note (Signed)
Addended by: Lenda Kelp on: 07/05/2016 05:17 PM   Modules accepted: Orders

## 2016-07-13 ENCOUNTER — Encounter: Payer: Self-pay | Admitting: Family Medicine

## 2016-07-13 ENCOUNTER — Ambulatory Visit (INDEPENDENT_AMBULATORY_CARE_PROVIDER_SITE_OTHER): Payer: Medicare Other | Admitting: Family Medicine

## 2016-07-13 DIAGNOSIS — M545 Low back pain: Secondary | ICD-10-CM

## 2016-07-13 MED ORDER — TRAMADOL HCL 50 MG PO TABS: 50.0000 mg | ORAL_TABLET | Freq: Four times a day (QID) | ORAL | 0 refills | Status: DC | PRN

## 2016-07-13 NOTE — Patient Instructions (Signed)
We discussed the other nerve blocking medications, physical therapy (you did not tolerate this), injections, surgery. Take tylenol with tramadol as needed.

## 2016-07-15 ENCOUNTER — Ambulatory Visit: Payer: Medicare Other | Admitting: Adult Health

## 2016-07-15 NOTE — Assessment & Plan Note (Signed)
pain due to spinal stenosis with some progression over past few years.  Doing well after prednisone dose pack.  We discussed nerve blocking medications, repeating physical therapy (did not tolerate this), ESIs.  She is going to take tylenol with tramadol as needed, do water aerobics.

## 2016-07-15 NOTE — Progress Notes (Signed)
PCP: COPLAND,JESSICA, MD  Subjective:   HPI: Patient is a 70 y.o. female here for low back pain.  7/18: Patient reports she's had worsening low back pain for past 2 months. Describes pain as a pressure, worse when lying in bed and trying to get up. Tried pain patch, icing, tylenol, ibuprofen. Reports the ibuprofen upset her stomach. Some tingling into left toes at times (1st-3rd). No bowel/bladder dysfunction. 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 She notes icing and an inflatable belt help with her pain.  8/22: Patient returns with continued severe pain in low back. Pain level 10/10, sharp. Worse with standing straight, extension. Radiates into left leg. Pain worse on left side of low back moreso than the right. Was using a cane, now using a walker. Improves with bending forward. Difficulty getting out of bed, changing positions. No bowel/bladder dysfunction.doing home exercises, using heating pad, an air belt. Doing water exercises at [facility]. No skin changes, numbness currently.  9/5: Patient reports she has improved a lot since last visit, prednisone. Pain level 3/10 midline of low back. Not as bad into the left leg. Worse with walking upright. No bowel/bladder dysfunction. No numbness or tingling.  Past Medical History:  Diagnosis Date  . Allergy   . Anxiety    Dr Madaline Guthrie  . Asthma   . Atypical seizure ([facility]) 06/07/2012  . Depression    Dr Madaline Guthrie  . Diabetes mellitus   . DVT (deep venous thrombosis) ([facility])   . Headache(784.0)   . Hyperlipidemia   . Hypersomnia with sleep apnea, unspecified 05/22/2014  . Olfactory aura   . OSA on CPAP   . Shingles 07/2013    Current Outpatient Prescriptions on File Prior to Visit  Medication Sig Dispense Refill  . Acetaminophen (TYLENOL EXTRA STRENGTH PO) Take 1 tablet by mouth as needed.    .  clindamycin (CLEOCIN) 300 MG capsule Take 1 capsule (300 mg total) by mouth 3 (three) times daily. X 7 days 21 capsule 0  . diazepam (VALIUM) 5 MG tablet Take 1 tablet PO 30 minutes prior to procedure - may repeat x1 2 tablet 0  . metFORMIN (GLUCOPHAGE-XR) 750 MG 24 hr tablet 1 tablet daily. Reported on 05/05/2016    . predniSONE (DELTASONE) 10 MG tablet 6 tabs po day 1, 5 tabs po day 2, 4 tabs po day 3, 3 tabs po day 4, 2 tabs po day 5, 1 tab po day 6 21 tablet 0   No current facility-administered medications on file prior to visit.     Past Surgical History:  Procedure Laterality Date  . APPENDECTOMY    . CHOLECYSTECTOMY    . COLONOSCOPY  2006   [facility]  . rotator cuff surgery    . TONSILLECTOMY       Allergies  Allergen Reactions  . Mushroom Ext Cmplx(Shiitake-Reishi-Mait) Anaphylaxis  . Shellfish Allergy Anaphylaxis  . Sulfonamide Derivatives Anaphylaxis    unknown  . Iodinated Diagnostic Agents     Other reaction(s): Other (See Comments) Bad head ahce  . Acetaminophen     REACTION: STOMACH DISCOMFORT  . Butalbital-Aspirin-Caffeine     unknown  . Clarithromycin Nausea And Vomiting  . Fluticasone-Salmeterol     Feels like something in the throat  . Montelukast Sodium     unknown  . Moxifloxacin     unknown  . Orphenadrine Nausea  And Vomiting  . Orphenadrine Citrate     unknown  . Penicillins     unknown    Social History   Social History  . Marital status: Married    Spouse name: Lars MageJuan  . Number of children: 2  . Years of education: College   Occupational History  . Not on file.   Social History Main Topics  . Smoking status: Never Smoker  . Smokeless tobacco: Never Used  . Alcohol use 0.0 oz/week     Comment: occ  . Drug use: No  . Sexual activity: Not on file   Other Topics Concern  . Not on file   Social History Narrative   Patient is married Lars Mage(Juan) and lives at home with her husband.   Patient has two adult children.   Patient is retired.    Patient has a college education.   Patient is right-handed.   Patient drinks one cup of tea daily.    Family History  Problem Relation Age of Onset  . Stomach cancer Father   . Coronary artery disease Father   . Breast cancer Sister     breast cancer  . Diabetes Maternal Aunt   . Diabetes Paternal Aunt   . Prostate cancer Brother   . Stroke Neg Hx     BP 123/83   Pulse 66   Ht 4\' 11"  (1.499 m)   Wt 263 lb (119.3 kg)   BMI 53.12 kg/m   Review of Systems: See HPI above.    Objective:  Physical Exam:  Gen: NAD, comfortable in exam room  Back: No gross deformity, scoliosis. TTP bilateral SI joints.  No midline or bony TTP.  No paraspinal tenderness otherwise FROM with pain on extension only. Strength LEs 5/5 all muscle groups.   2+ MSRs in patellar and achilles tendons, equal bilaterally. Negative SLRs. Sensation intact to light touch bilaterally. Negative logroll bilateral hips    Assessment & Plan:  1. Low back pain - pain due to spinal stenosis with some progression over past few years.  Doing well after prednisone dose pack.  We discussed nerve blocking medications, repeating physical therapy (did not tolerate this), ESIs.  She is going to take tylenol with tramadol as needed, do water aerobics.  We had discussion in person and on telephone re: seizures - some concern in past about her having atypical seizures which patient denies - was question of olfactory aura 2+ years ago and was on lamictal for a time - not on anything now, denies any problems.  Advised about increased seizure risk on tramadol if she had prior seizures.  They will call me with any concerns, stop medicine if she starts to experience the aura again.

## 2016-08-03 ENCOUNTER — Telehealth: Payer: Self-pay | Admitting: Family Medicine

## 2016-08-03 NOTE — Telephone Encounter (Signed)
I would not be concerned about swelling by itself in the legs - she is walking a lot in [place] on vacation right now.  I'd be more concerned if it was just one leg that was swelling with redness.  See if she's having much pain.  Elevation, icing, compression for sure.  She can take tramadol, use something topical too if she needs to.

## 2016-08-03 NOTE — Telephone Encounter (Signed)
Spoke to patient and gave her information provided by physician. 

## 2016-09-10 ENCOUNTER — Ambulatory Visit (HOSPITAL_COMMUNITY)
Admission: RE | Admit: 2016-09-10 | Discharge: 2016-09-10 | Disposition: A | Discharge status: Home / Self Care | Payer: Medicare Other | Source: Ambulatory Visit | Attending: Internal Medicine | Admitting: Internal Medicine

## 2016-09-10 ENCOUNTER — Other Ambulatory Visit: Payer: Self-pay | Admitting: Internal Medicine

## 2016-09-10 DIAGNOSIS — M7989 Other specified soft tissue disorders: Principal | ICD-10-CM

## 2016-09-10 DIAGNOSIS — M79604 Pain in right leg: Secondary | ICD-10-CM | POA: Insufficient documentation

## 2016-09-10 NOTE — Progress Notes (Addendum)
*  PRELIMINARY RESULTS* Vascular Ultrasound Right lower extremity venous duplex has been completed.  Preliminary findings: No obvious evidence of DVT in visualized veins or baker's cyst.   Called results to Dr. Chestine Spore.  Farrel Demark, RDMS, RVT  09/10/2016, 4:41 PM

## 2016-09-17 ENCOUNTER — Other Ambulatory Visit (HOSPITAL_BASED_OUTPATIENT_CLINIC_OR_DEPARTMENT_OTHER): Payer: Medicare Other

## 2016-09-20 ENCOUNTER — Ambulatory Visit (HOSPITAL_BASED_OUTPATIENT_CLINIC_OR_DEPARTMENT_OTHER)
Admission: RE | Admit: 2016-09-20 | Discharge: 2016-09-20 | Disposition: A | Discharge status: Home / Self Care | Payer: Medicare Other | Source: Ambulatory Visit | Attending: Family Medicine | Admitting: Family Medicine

## 2016-09-20 DIAGNOSIS — M85851 Other specified disorders of bone density and structure, right thigh: Secondary | ICD-10-CM | POA: Insufficient documentation

## 2016-09-20 DIAGNOSIS — E2839 Other primary ovarian failure: Secondary | ICD-10-CM | POA: Diagnosis present

## 2016-09-20 DIAGNOSIS — M8588 Other specified disorders of bone density and structure, other site: Secondary | ICD-10-CM | POA: Insufficient documentation

## 2016-09-21 ENCOUNTER — Encounter: Payer: Self-pay | Admitting: Family Medicine

## 2016-09-21 DIAGNOSIS — M8580 Other specified disorders of bone density and structure, unspecified site: Secondary | ICD-10-CM

## 2016-09-21 HISTORY — DX: Other specified disorders of bone density and structure, unspecified site: M85.80

## 2016-11-11 ENCOUNTER — Encounter: Payer: Self-pay | Admitting: Family Medicine

## 2016-11-11 ENCOUNTER — Ambulatory Visit (INDEPENDENT_AMBULATORY_CARE_PROVIDER_SITE_OTHER): Payer: Medicare Other | Admitting: Family Medicine

## 2016-11-11 DIAGNOSIS — G8929 Other chronic pain: Secondary | ICD-10-CM

## 2016-11-11 DIAGNOSIS — M545 Low back pain: Secondary | ICD-10-CM

## 2016-11-11 NOTE — Patient Instructions (Signed)
We will go ahead with the injections at L4-5 level on both sides. Call me a week after you get the injections to let me know how you're doing.

## 2016-11-12 ENCOUNTER — Other Ambulatory Visit: Payer: Self-pay | Admitting: Family Medicine

## 2016-11-12 DIAGNOSIS — M545 Low back pain: Secondary | ICD-10-CM

## 2016-11-15 NOTE — Assessment & Plan Note (Signed)
pain due to spinal stenosis.  Short term relief with oral prednisone unfortunately.  She would like to go ahead with ESIs at this point so will order bilaterally at L4-5 levels.  Call us a week following this to let us know how she's doing.  Tylenol, tramadol if needed.

## 2016-11-15 NOTE — Progress Notes (Signed)
13 hour prep called to [place].  Called pt and explained prep to her and her husband

## 2016-11-15 NOTE — Progress Notes (Signed)
PCP: COPLAND,JESSICA, MD  Subjective:   HPI: Patient is a 71 y.o. female here for low back pain.  7/18: Patient reports she's had worsening low back pain for past 2 months. Describes pain as a pressure, worse when lying in bed and trying to get up. Tried pain patch, icing, tylenol, ibuprofen. Reports the ibuprofen upset her stomach. Some tingling into left toes at times (1st-3rd). No bowel/bladder dysfunction. 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 She notes icing and an inflatable belt help with her pain.  8/22: Patient returns with continued severe pain in low back. Pain level 10/10, sharp. Worse with standing straight, extension. Radiates into left leg. Pain worse on left side of low back moreso than the right. Was using a cane, now using a walker. Improves with bending forward. Difficulty getting out of bed, changing positions. No bowel/bladder dysfunction.doing home exercises, using heating pad, an air belt. Doing water exercises at [facility]. No skin changes, numbness currently.  07/13/16: Patient reports she has improved a lot since last visit, prednisone. Pain level 3/10 midline of low back. Not as bad into the left leg. Worse with walking upright. No bowel/bladder dysfunction. No numbness or tingling.  11/11/16: Patient reports pain returned over past month in low back. Pain level 9/10, sharp. Feels pain in bilateral low back into both legs. Taking tylenol, using salon pas patches. Uses rolling walker. Difficulty changing positions. Pain is like a needle sensation. No bowel/bladder dysfunction. No numbness or tingling.  Past Medical History:  Diagnosis Date  . Allergy   . Anxiety    Dr Madaline Guthrie  . Asthma   . Atypical seizure (HCC) 06/07/2012  . Depression    Dr Madaline Guthrie  . Diabetes mellitus   . DVT (deep venous thrombosis) (HCC)    . Headache(784.0)   . Hyperlipidemia   . Hypersomnia with sleep apnea, unspecified 05/22/2014  . Olfactory aura   . OSA on CPAP   . Shingles 07/2013    Current Outpatient Prescriptions on File Prior to Visit  Medication Sig Dispense Refill  . Acetaminophen (TYLENOL EXTRA STRENGTH PO) Take 1 tablet by mouth as needed.    . clindamycin (CLEOCIN) 300 MG capsule Take 1 capsule (300 mg total) by mouth 3 (three) times daily. X 7 days 21 capsule 0  . diazepam (VALIUM) 5 MG tablet Take 1 tablet PO 30 minutes prior to procedure - may repeat x1 2 tablet 0  . metFORMIN (GLUCOPHAGE-XR) 750 MG 24 hr tablet 1 tablet daily. Reported on 05/05/2016    . predniSONE (DELTASONE) 10 MG tablet 6 tabs po day 1, 5 tabs po day 2, 4 tabs po day 3, 3 tabs po day 4, 2 tabs po day 5, 1 tab po day 6 21 tablet 0  . traMADol (ULTRAM) 50 MG tablet Take 1 tablet (50 mg total) by mouth every 6 (six) hours as needed. 40 tablet 0   No current facility-administered medications on file prior to visit.     Past Surgical History:  Procedure Laterality Date  . APPENDECTOMY    . CHOLECYSTECTOMY    . COLONOSCOPY  2006   [facility]  . rotator cuff surgery    . TONSILLECTOMY       Allergies  Allergen Reactions  . Mushroom Ext Cmplx(Shiitake-Reishi-Mait) Anaphylaxis  . Shellfish Allergy Anaphylaxis  . Sulfonamide Derivatives Anaphylaxis    unknown  .  Iodinated Diagnostic Agents     Other reaction(s): Other (See Comments) Bad head ahce  . Acetaminophen     REACTION: STOMACH DISCOMFORT  . Butalbital-Aspirin-Caffeine     unknown  . Clarithromycin Nausea And Vomiting  . Fluticasone-Salmeterol     Feels like something in the throat  . Montelukast Sodium     unknown  . Moxifloxacin     unknown  . Orphenadrine Nausea And Vomiting  . Orphenadrine Citrate     unknown  . Penicillins     unknown    Social History   Social History  . Marital status: Married    Spouse name: Lars Mage  . Number of children: 2  . Years  of education: College   Occupational History  . Not on file.   Social History Main Topics  . Smoking status: Never Smoker  . Smokeless tobacco: Never Used  . Alcohol use 0.0 oz/week     Comment: occ  . Drug use: No  . Sexual activity: Not on file   Other Topics Concern  . Not on file   Social History Narrative   Patient is married Lars Mage) and lives at home with her husband.   Patient has two adult children.   Patient is retired.   Patient has a college education.   Patient is right-handed.   Patient drinks one cup of tea daily.    Family History  Problem Relation Age of Onset  . Stomach cancer Father   . Coronary artery disease Father   . Breast cancer Sister     breast cancer  . Diabetes Maternal Aunt   . Diabetes Paternal Aunt   . Prostate cancer Brother   . Stroke Neg Hx     BP 131/85   Pulse 85   Ht 4\' 11"  (1.499 m)   Review of Systems: See HPI above.    Objective:  Physical Exam:  Gen: NAD, comfortable in exam room  Back: No gross deformity, scoliosis. TTP bilateral SI joints and paraspinal lumbar regions.  No midline or bony TTP. Limited ROM with pain on extension. Strength LEs 5/5 all muscle groups.   Negative SLRs. Sensation intact to light touch bilaterally. Negative logroll bilateral hips    Assessment & Plan:  1. Low back pain - pain due to spinal stenosis.  Short term relief with oral prednisone unfortunately.  She would like to go ahead with ESIs at this point so will order bilaterally at L4-5 levels.  Call us a week following this to let us know how she's doing.  Tylenol, tramadol if needed.

## 2016-11-16 ENCOUNTER — Ambulatory Visit
Admission: RE | Admit: 2016-11-16 | Discharge: 2016-11-16 | Disposition: A | Discharge status: Home / Self Care | Payer: Medicare Other | Source: Ambulatory Visit | Attending: Family Medicine | Admitting: Family Medicine

## 2016-11-16 ENCOUNTER — Other Ambulatory Visit: Payer: Self-pay | Admitting: Family Medicine

## 2016-11-16 DIAGNOSIS — M545 Low back pain: Secondary | ICD-10-CM

## 2016-11-16 MED ORDER — METHYLPREDNISOLONE ACETATE 40 MG/ML INJ SUSP (RADIOLOG
120.0000 mg | Freq: Once | INTRAMUSCULAR | Status: AC
  Administered 2016-11-16: 120 mg via INTRA_ARTICULAR

## 2016-11-16 NOTE — Discharge Instructions (Signed)

## 2016-11-19 ENCOUNTER — Other Ambulatory Visit: Payer: Medicare Other

## 2016-11-26 ENCOUNTER — Encounter: Payer: Self-pay | Admitting: Family Medicine

## 2016-11-26 ENCOUNTER — Ambulatory Visit (INDEPENDENT_AMBULATORY_CARE_PROVIDER_SITE_OTHER): Payer: Medicare Other | Admitting: Family Medicine

## 2016-11-26 DIAGNOSIS — G8929 Other chronic pain: Secondary | ICD-10-CM

## 2016-11-26 DIAGNOSIS — M545 Low back pain: Secondary | ICD-10-CM

## 2016-11-26 NOTE — Patient Instructions (Signed)
We will repeat the facet injection on the left side at L4-5. Call me a week after this to let me know how you're doing (we could do a 3rd one if you benefit but not enough). We will try to get records from Dr. Letta Moynahan office dating back to 2013.

## 2016-11-29 NOTE — Assessment & Plan Note (Signed)
pain due to spinal stenosis.  Improved with facet injections though with pain still on left side.  Will repeat this injection, call us a week later.  S/p prednisone dose pack.  She recalls having similar thing done by a Dr. Metta Clines with good relief - requested records from his office as well.  Tylenol, tramadol if needed.

## 2016-11-29 NOTE — Progress Notes (Signed)
PCP: COPLAND,JESSICA, MD  Subjective:   HPI: Patient is a 71 y.o. female here for low back pain.  7/18: Patient reports she's had worsening low back pain for past 2 months. Describes pain as a pressure, worse when lying in bed and trying to get up. Tried pain patch, icing, tylenol, ibuprofen. Reports the ibuprofen upset her stomach. Some tingling into left toes at times (1st-3rd). No bowel/bladder dysfunction. 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 She notes icing and an inflatable belt help with her pain.  8/22: Patient returns with continued severe pain in low back. Pain level 10/10, sharp. Worse with standing straight, extension. Radiates into left leg. Pain worse on left side of low back moreso than the right. Was using a cane, now using a walker. Improves with bending forward. Difficulty getting out of bed, changing positions. No bowel/bladder dysfunction.doing home exercises, using heating pad, an air belt. Doing water exercises at [facility]. No skin changes, numbness currently.  07/13/16: Patient reports she has improved a lot since last visit, prednisone. Pain level 3/10 midline of low back. Not as bad into the left leg. Worse with walking upright. No bowel/bladder dysfunction. No numbness or tingling.  11/11/16: Patient reports pain returned over past month in low back. Pain level 9/10, sharp. Feels pain in bilateral low back into both legs. Taking tylenol, using salon pas patches. Uses rolling walker. Difficulty changing positions. Pain is like a needle sensation. No bowel/bladder dysfunction. No numbness or tingling.  1/19: Patient reports her pain has improved since last visit. Pain level now 6.5-7 and only on left side. Worse with extension. Does radiate into her leg. Taking tylenol. No skin changes. No bowel/bladder  dysfunction. No numbness or tingling.  Past Medical History:  Diagnosis Date  . Allergy   . Anxiety    Dr Madaline Guthrie  . Asthma   . Atypical seizure (HCC) 06/07/2012  . Depression    Dr Madaline Guthrie  . Diabetes mellitus   . DVT (deep venous thrombosis) (HCC)   . Headache(784.0)   . Hyperlipidemia   . Hypersomnia with sleep apnea, unspecified 05/22/2014  . Olfactory aura   . OSA on CPAP   . Shingles 07/2013    Current Outpatient Prescriptions on File Prior to Visit  Medication Sig Dispense Refill  . Acetaminophen (TYLENOL EXTRA STRENGTH PO) Take 1 tablet by mouth as needed.    . clindamycin (CLEOCIN) 300 MG capsule Take 1 capsule (300 mg total) by mouth 3 (three) times daily. X 7 days 21 capsule 0  . diazepam (VALIUM) 5 MG tablet Take 1 tablet PO 30 minutes prior to procedure - may repeat x1 2 tablet 0  . furosemide (LASIX) 20 MG tablet     . metFORMIN (GLUCOPHAGE-XR) 750 MG 24 hr tablet 1 tablet daily. Reported on 05/05/2016    . predniSONE (DELTASONE) 10 MG tablet 6 tabs po day 1, 5 tabs po day 2, 4 tabs po day 3, 3 tabs po day 4, 2 tabs po day 5, 1 tab po day 6 21 tablet 0   No current facility-administered medications on file prior to visit.     Past Surgical History:  Procedure Laterality Date  . APPENDECTOMY    . CHOLECYSTECTOMY    . COLONOSCOPY  2006   [facility]  . rotator cuff surgery    . TONSILLECTOMY  Allergies  Allergen Reactions  . Mushroom Ext Cmplx(Shiitake-Reishi-Mait) Anaphylaxis  . Shellfish Allergy Anaphylaxis  . Sulfonamide Derivatives Anaphylaxis    unknown  . Iodinated Diagnostic Agents     Other reaction(s): Other (See Comments) Bad head ahce  . Acetaminophen     REACTION: STOMACH DISCOMFORT  . Butalbital-Aspirin-Caffeine     unknown  . Clarithromycin Nausea And Vomiting  . Fluticasone-Salmeterol     Feels like something in the throat  . Montelukast Sodium     unknown  . Moxifloxacin     unknown  . Orphenadrine Nausea And Vomiting  .  Orphenadrine Citrate     unknown  . Penicillins     unknown    Social History   Social History  . Marital status: Married    Spouse name: Lars Mage  . Number of children: 2  . Years of education: College   Occupational History  . Not on file.   Social History Main Topics  . Smoking status: Never Smoker  . Smokeless tobacco: Never Used  . Alcohol use 0.0 oz/week     Comment: occ  . Drug use: No  . Sexual activity: Not on file   Other Topics Concern  . Not on file   Social History Narrative   Patient is married Lars Mage) and lives at home with her husband.   Patient has two adult children.   Patient is retired.   Patient has a college education.   Patient is right-handed.   Patient drinks one cup of tea daily.    Family History  Problem Relation Age of Onset  . Stomach cancer Father   . Coronary artery disease Father   . Breast cancer Sister     breast cancer  . Diabetes Maternal Aunt   . Diabetes Paternal Aunt   . Prostate cancer Brother   . Stroke Neg Hx     BP (!) 149/81   Ht 4\' 11"  (1.499 m)   Wt 260 lb (117.9 kg)   BMI 52.51 kg/m   Review of Systems: See HPI above.    Objective:  Physical Exam:  Gen: NAD, comfortable in exam room  Back: No gross deformity, scoliosis. TTP left SI joint, paraspinal lumbar region.  No midline or bony TTP. Full flexion, limited extension and painful. Strength LEs 5/5 all muscle groups.   Negative SLRs. Sensation intact to light touch bilaterally. Negative logroll bilateral hips    Assessment & Plan:  1. Low back pain - pain due to spinal stenosis.  Improved with facet injections though with pain still on left side.  Will repeat this injection, call us a week later.  S/p prednisone dose pack.  She recalls having similar thing done by a Dr. Metta Clines with good relief - requested records from his office as well.  Tylenol, tramadol if needed.

## 2016-12-01 ENCOUNTER — Other Ambulatory Visit: Payer: Self-pay | Admitting: Family Medicine

## 2016-12-01 DIAGNOSIS — G8929 Other chronic pain: Secondary | ICD-10-CM

## 2016-12-01 DIAGNOSIS — M545 Low back pain: Principal | ICD-10-CM

## 2016-12-16 ENCOUNTER — Telehealth: Payer: Self-pay | Admitting: Family Medicine

## 2016-12-16 NOTE — Telephone Encounter (Signed)
Patient Name: ARMONII Konkel  DOB: [birth year]    Initial Comment Caller states that his wife started taking pain medication for her back 3 days ago, and the medication hurt her stomach and made her feel like she was going to pass out. Her BP at 2 pm 121/63 , 15 min later 129/64, and the current one is 121/52. She was take Oxycodone/Acetaminophen 10-325 mg tb. She was having trouble breathing the other day when she was taking it. She is dizzy and feels faint.   Nurse Assessment  Nurse: Nicanor Bake, RN, Marylene Land Date/Time (Eastern Time): 12/16/2016 3:02:07 PM  Confirm and document reason for call. If symptomatic, describe symptoms. ---Caller states that his wife started taking pain medication for her back 3 days ago. 2 wks ago she had injections in her back. She had a left over Rx of Oxycodone/Acetaminophen 10-325, she did not take any when she had her shoulder surgery. She took 1 then 6 hrs later and the 3rd on Tuesday afternoon. She states the medication hurt her stomach and made her feel like she was going to pass out. Her BP is 121/62 today. The only med she has had since Tuesday is Tylenol. She states she feels dizzy and the room is spinning (per her medical record- noted she has a history of this). She has a current tooth infection. The husband said with all her problems, they thought it would be time to see her PCP.  Does the patient have any new or worsening symptoms? ---Yes  Will a triage be completed? ---Yes  Related visit to physician within the last 2 weeks? ---Yes  Does the PT have any chronic conditions? (i.e. diabetes, asthma, etc.) ---Yes  List chronic conditions. ---spinal stenosis, diabetes, high cholesterol, total knee  Is this a behavioral health or substance abuse call? ---No     Guidelines    Guideline Title Affirmed Question Affirmed Notes  Dizziness - Vertigo [1] Dizziness (vertigo) present now AND [2] one or more stroke risk factors (i.e., hypertension, diabetes, prior  stroke/TIA/heart attack) (Exception: prior physician evaluation for this AND no different/worse than usual)    Final Disposition User   Go to ED Now (or PCP triage) Nicanor Bake, RN, Angela    Referrals  [facility] - ED  [facility] - ED   Disagree/Comply: Comply

## 2016-12-17 ENCOUNTER — Encounter: Payer: Self-pay | Admitting: Medical

## 2016-12-17 ENCOUNTER — Emergency Department (HOSPITAL_BASED_OUTPATIENT_CLINIC_OR_DEPARTMENT_OTHER): Payer: Medicare Other

## 2016-12-17 ENCOUNTER — Ambulatory Visit (INDEPENDENT_AMBULATORY_CARE_PROVIDER_SITE_OTHER): Payer: Medicare Other | Admitting: Medical

## 2016-12-17 ENCOUNTER — Encounter (HOSPITAL_BASED_OUTPATIENT_CLINIC_OR_DEPARTMENT_OTHER): Payer: Self-pay | Admitting: *Deleted

## 2016-12-17 ENCOUNTER — Emergency Department (HOSPITAL_BASED_OUTPATIENT_CLINIC_OR_DEPARTMENT_OTHER)
Admission: EM | Admit: 2016-12-17 | Discharge: 2016-12-17 | Disposition: A | Discharge status: Home / Self Care | Payer: Medicare Other | Attending: Emergency Medicine | Admitting: Emergency Medicine

## 2016-12-17 VITALS — BP 121/90 | HR 83 | Ht 59.0 in | Wt 261.0 lb

## 2016-12-17 DIAGNOSIS — R55 Syncope and collapse: Secondary | ICD-10-CM | POA: Diagnosis not present

## 2016-12-17 DIAGNOSIS — E119 Type 2 diabetes mellitus without complications: Secondary | ICD-10-CM | POA: Diagnosis not present

## 2016-12-17 DIAGNOSIS — R110 Nausea: Secondary | ICD-10-CM

## 2016-12-17 DIAGNOSIS — R51 Headache: Secondary | ICD-10-CM

## 2016-12-17 DIAGNOSIS — R42 Dizziness and giddiness: Secondary | ICD-10-CM | POA: Insufficient documentation

## 2016-12-17 DIAGNOSIS — Z7984 Long term (current) use of oral hypoglycemic drugs: Secondary | ICD-10-CM | POA: Insufficient documentation

## 2016-12-17 DIAGNOSIS — R1013 Epigastric pain: Secondary | ICD-10-CM | POA: Diagnosis not present

## 2016-12-17 DIAGNOSIS — Z79899 Other long term (current) drug therapy: Secondary | ICD-10-CM | POA: Diagnosis not present

## 2016-12-17 DIAGNOSIS — R5383 Other fatigue: Secondary | ICD-10-CM | POA: Diagnosis not present

## 2016-12-17 DIAGNOSIS — R0600 Dyspnea, unspecified: Secondary | ICD-10-CM

## 2016-12-17 DIAGNOSIS — J45909 Unspecified asthma, uncomplicated: Secondary | ICD-10-CM | POA: Insufficient documentation

## 2016-12-17 DIAGNOSIS — R6883 Chills (without fever): Secondary | ICD-10-CM

## 2016-12-17 DIAGNOSIS — R519 Headache, unspecified: Secondary | ICD-10-CM

## 2016-12-17 LAB — COMPREHENSIVE METABOLIC PANEL
ALT: 50 U/L (ref 14–54)
AST: 24 U/L (ref 15–41)
Albumin: 4 g/dL (ref 3.5–5.0)
Alkaline Phosphatase: 75 U/L (ref 38–126)
Anion gap: 6 (ref 5–15)
BUN: 12 mg/dL (ref 6–20)
CALCIUM: 9.2 mg/dL (ref 8.9–10.3)
CHLORIDE: 103 mmol/L (ref 101–111)
CO2: 32 mmol/L (ref 22–32)
CREATININE: 0.73 mg/dL (ref 0.44–1.00)
Glucose, Bld: 85 mg/dL (ref 65–99)
Potassium: 4.5 mmol/L (ref 3.5–5.1)
Sodium: 141 mmol/L (ref 135–145)
TOTAL PROTEIN: 6.8 g/dL (ref 6.5–8.1)
Total Bilirubin: 0.7 mg/dL (ref 0.3–1.2)

## 2016-12-17 LAB — URINALYSIS, ROUTINE W REFLEX MICROSCOPIC
Bilirubin Urine: NEGATIVE
Glucose, UA: 250 mg/dL — AB
Hgb urine dipstick: NEGATIVE
Ketones, ur: NEGATIVE mg/dL
Nitrite: NEGATIVE
PROTEIN: NEGATIVE mg/dL
Specific Gravity, Urine: 1.015 (ref 1.005–1.030)
pH: 6 (ref 5.0–8.0)

## 2016-12-17 LAB — URINALYSIS, MICROSCOPIC (REFLEX): RBC / HPF: NONE SEEN RBC/hpf (ref 0–5)

## 2016-12-17 LAB — CBC WITH DIFFERENTIAL/PLATELET
BASOS ABS: 0 10*3/uL (ref 0.0–0.1)
BASOS PCT: 0 %
EOS ABS: 0.2 10*3/uL (ref 0.0–0.7)
Eosinophils Relative: 2 %
HCT: 44.8 % (ref 36.0–46.0)
HEMOGLOBIN: 15 g/dL (ref 12.0–15.0)
LYMPHS ABS: 2.3 10*3/uL (ref 0.7–4.0)
Lymphocytes Relative: 23 %
MCH: 30.9 pg (ref 26.0–34.0)
MCHC: 33.5 g/dL (ref 30.0–36.0)
MCV: 92.4 fL (ref 78.0–100.0)
Monocytes Absolute: 1.1 10*3/uL — ABNORMAL HIGH (ref 0.1–1.0)
Monocytes Relative: 11 %
Neutro Abs: 6.3 10*3/uL (ref 1.7–7.7)
Neutrophils Relative %: 64 %
Platelets: 218 10*3/uL (ref 150–400)
RBC: 4.85 MIL/uL (ref 3.87–5.11)
RDW: 13.1 % (ref 11.5–15.5)
WBC: 9.9 10*3/uL (ref 4.0–10.5)

## 2016-12-17 LAB — D-DIMER, QUANTITATIVE: D-Dimer, Quant: 0.35 ug/mL-FEU (ref 0.00–0.50)

## 2016-12-17 LAB — TROPONIN I

## 2016-12-17 NOTE — Patient Instructions (Addendum)
For your near syncope in office, severe dyspnea on ambulation and other symptoms, we need to send you down stairs to be in evaluated in the ED. This is best course of action as we have weekend upcoming and it is best to get stat work up and treatment.  Other symptonms since Monday fatigue, chills, nauseau, and head pressure.   Regarding pt recent dyspnea on ambulation she does have history of dvt and PE.  Notified the ED MD of pt presentation.  Pt sent down with staff in wheel chair to risk of fall/possible syncope.  Follow up date to be determined by ED.

## 2016-12-17 NOTE — Telephone Encounter (Signed)
Per chart, pt did not go to ER as advised. Called to follow up with patient.  Pt states she did not go to the ER. Instead she stated home. She called office earlier today to schedule an appt and an appt was scheduled. Pt states she still feels dizzy, but it's not as bad.  She reports having a upset stomach.  She doesn't have much of an appetite. She's eating some.  Denied n/v.  She also reports "peeing a lot," but still experiencing fluid retention in fingers and toes. Pt is also a diabetic, but said blood sugars have been running fine. BS: 100, 125.    Given patient's report, she was advised to be seen today for an evaluation before the weekend. Pt agreed.  Appt scheduled with Esperanza Richters, PA-C today at 1:30pm.

## 2016-12-17 NOTE — ED Notes (Signed)
Patient transported to X-ray 

## 2016-12-17 NOTE — ED Notes (Signed)
Patient ambulated in the hallway. The patient is with steady gait and no distress noted at this time - patient denies any dizziness or trouble breathing with ambulatory. O2 sats WNL during ambulation

## 2016-12-17 NOTE — ED Triage Notes (Signed)
Pt sent from [facility]D office for eval c/o dizziness, SOB and back pain  x 5 days

## 2016-12-17 NOTE — Progress Notes (Signed)
Pre visit review using our clinic review tool, if applicable. No additional management support is needed unless otherwise documented below in the visit note/SLS  

## 2016-12-17 NOTE — ED Provider Notes (Signed)
[facility] Provider Note   CSN: [id number] Arrival date & time: 12/17/16  1516     History   Chief Complaint Chief Complaint  Patient presents with  . Dizziness    HPI Nicole Mccann is a 71 y.o. female.  The history is provided by the patient and the spouse. No language interpreter was used.  Dizziness   Nicole Mccann is a 71 y.o. female who presents to the Emergency Department complaining of sob/dizziness.  She presents from her PCP's office for evaluation of dizziness.  She reports five days of feeling lightheaded with activity and near-syncopal.  She has associated DOE.  She has abdominal bloating with emesis x 2 (Tuesday and Wednesday), constipation, LUQ pain.  No fevers, dysuria, sick contacts, chest pain, cough.  No prior similar sxs.  She has decreased oral intake.  She developed a headache today when she was laid supine for EKG.  Sxs are moderate, waxing and waning, worsening.   Past Medical History:  Diagnosis Date  . Allergy   . Anxiety    Dr Madaline Guthrie  . Asthma   . Atypical seizure (HCC) 06/07/2012  . Depression    Dr Madaline Guthrie  . Diabetes mellitus   . DVT (deep venous thrombosis) (HCC)   . Headache(784.0)   . Hyperlipidemia   . Hypersomnia with sleep apnea, unspecified 05/22/2014  . Olfactory aura   . OSA on CPAP   . Shingles 07/2013    Patient Active Problem List   Diagnosis Date Noted  . Osteopenia 09/21/2016  . Diabetic polyneuropathy associated with diabetes mellitus due to underlying condition (HCC) 06/03/2016  . Low back pain 05/26/2016  . Encounter for medication management 04/15/2015  . Right shoulder pain 04/04/2015  . Adjustment disorder with mixed anxiety and depressed mood 03/14/2015  . Rotator cuff (capsule) sprain 03/13/2015  . Pseudoptosis 11/18/2014  . Seizure disorder, temporal lobe, intractable (HCC) 10/17/2014  . 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 Neuropathy due to secondary diabetes mellitus (HCC) 10/17/2014    . Severe obesity (BMI >= 40) (HCC) 10/17/2014  . OSA on CPAP 10/17/2014  . Hypersomnia with sleep apnea, unspecified 05/22/2014  . Injury of left knee 08/22/2013  . Variants of migraine, not elsewhere classified, without mention of intractable migraine without mention of status migrainosus 05/22/2013  . 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 Spinal stenosis of lumbar region 12/17/2012  . Pulmonary embolism (HCC) 07/09/2012  . DVT (deep venous thrombosis) (HCC) 07/07/2012  . Atypical seizure (HCC) 06/07/2012  . Phlebitis following infusion 06/05/2012  . BENIGN POSITIONAL VERTIGO 11/06/2009  . DIABETES MELLITUS, TYPE II 04/04/2009  . ALLERGIC RHINITIS 04/04/2009  . ASTHMA 04/04/2009  . DIVERTICULOSIS, COLON 04/04/2009  . HYPERLIPIDEMIA NEC/NOS 06/29/2007  . DISORDER, DYSMETABOLIC SYNDROME X 06/29/2007    Past Surgical History:  Procedure Laterality Date  . APPENDECTOMY    . CHOLECYSTECTOMY    . COLONOSCOPY  2006   [facility]  . rotator cuff surgery    . TONSILLECTOMY       OB History    No data available       Home Medications    Prior to Admission medications   Medication Sig Start Date End Date Taking? Authorizing Provider  Acetaminophen (TYLENOL EXTRA STRENGTH PO) Take 1 tablet by mouth as needed.    Historical Provider, MD  diazepam (VALIUM) 5 MG tablet Take 1 tablet PO 30 minutes prior to procedure - may repeat x1 06/29/16   Vincenza Hews  R Hudnall, MD  metFORMIN (GLUCOPHAGE-XR) 750 MG 24 hr tablet 1 tablet daily. Taken if CBG is >120 two days in a row 05/01/14   Historical Provider, MD    Family History Family History  Problem Relation Age of Onset  . Stomach cancer Father   . Coronary artery disease Father   . Breast cancer Sister     breast cancer  . Diabetes Maternal Aunt   . Diabetes Paternal Aunt   . Prostate cancer Brother   . Stroke Neg Hx     Social History Social History  Substance Use Topics  . Smoking status: Never Smoker  .  Smokeless tobacco: Never Used  . Alcohol use 0.0 oz/week     Comment: occ     Allergies   Mushroom ext cmplx(shiitake-reishi-mait); Shellfish allergy; Sulfonamide derivatives; Iodinated diagnostic agents; Acetaminophen; Butalbital-aspirin-caffeine; Clarithromycin; Fluticasone-salmeterol; Montelukast sodium; Moxifloxacin; Orphenadrine; Orphenadrine citrate; and Penicillins   Review of Systems Review of Systems  Neurological: Positive for dizziness.  All other systems reviewed and are negative.    Physical Exam Updated Vital Signs BP 127/65   Pulse 74   Temp 98.2 F (36.8 C)   Resp 20   Ht 4\' 11"  (1.499 m)   Wt 261 lb (118.4 kg)   SpO2 98%   BMI 52.72 kg/m   Physical Exam  Constitutional: She is oriented to person, place, and time. She appears well-developed and well-nourished.  HENT:  Head: Normocephalic and atraumatic.  Eyes: EOM are normal. Pupils are equal, round, and reactive to light.  Cardiovascular: Normal rate and regular rhythm.   No murmur heard. Pulmonary/Chest: Effort normal and breath sounds normal. No respiratory distress.  Abdominal: Soft. There is no rebound and no guarding.  Obese abdomen with mild distension.  No focal abdominal tenderness  Musculoskeletal: She exhibits no tenderness.  Trace edema to BLE.   Neurological: She is alert and oriented to person, place, and time.  5/5 strength in all four extremities.   Skin: Skin is warm and dry.  Psychiatric: She has a normal mood and affect. Her behavior is normal.  Nursing note and vitals reviewed.    ED Treatments / Results  Labs (all labs ordered are listed, but only abnormal results are displayed) Labs Reviewed  CBC WITH DIFFERENTIAL/PLATELET - Abnormal; Notable for the following:       Result Value   Monocytes Absolute 1.1 (*)    All other components within normal limits  URINALYSIS, ROUTINE W REFLEX MICROSCOPIC - Abnormal; Notable for the following:    APPearance CLOUDY (*)    Glucose,  UA 250 (*)    Leukocytes, UA SMALL (*)    All other components within normal limits  URINALYSIS, MICROSCOPIC (REFLEX) - Abnormal; Notable for the following:    Bacteria, UA RARE (*)    Squamous Epithelial / LPF 6-30 (*)    All other components within normal limits  COMPREHENSIVE METABOLIC PANEL  TROPONIN I  D-DIMER, QUANTITATIVE (NOT AT [facility])    EKG  EKG Interpretation  Date/Time:  Friday December 17 2016 16:03:35 EST Ventricular Rate:  69 PR Interval:    QRS Duration: 90 QT Interval:  395 QTC Calculation: 424 R Axis:   73 Text Interpretation:  Sinus rhythm Borderline T abnormalities, anterior leads Confirmed by Lincoln Brigham [phone]) on 12/17/2016 4:19:27 PM       Radiology Dg Chest 2 View  Result Date: 12/17/2016 CLINICAL DATA:  Dizziness.  Shortness of breath. EXAM: CHEST  2 VIEW COMPARISON:  July 07, 2012 FINDINGS: No pneumothorax. Mild atelectasis in the left lung base. No focal infiltrate. No other interval changes or acute abnormalities. IMPRESSION: No active cardiopulmonary disease. Electronically Signed   By: Gerome Sam III M.D   On: 12/17/2016 16:03    Procedures Procedures (including critical care time)  Medications Ordered in ED Medications - No data to display   Initial Impression / Assessment and Plan / ED Course  I have reviewed the triage vital signs and the nursing notes.  Pertinent labs & imaging results that were available during my care of the patient were reviewed by me and considered in my medical decision making (see chart for details).   Patient here for evaluation of nausea, shortness of breath, dyspnea on exertion. She is able to ambulate in the emergency department without difficulty. Lungs are clear on examination. D-dimer is negative, current clinical picture is not consistent with PE. No evidence of pneumonia, CHF, serious bacterial infection. Counseled patient on home care, outpatient follow-up and return precautions for near-syncope.  Offered patient anti-medic and she declined. Final Clinical Impressions(s) / ED Diagnoses   Final diagnoses:  Near syncope    New Prescriptions New Prescriptions   No medications on file     Tilden Fossa, MD 12/17/16 [id number]

## 2016-12-17 NOTE — Progress Notes (Signed)
Subjective:    Patient ID: Nicole Mccann, female    DOB: February 07, 1946, 71 y.o.   MRN: [id number]  HPI    Dyspnea- Pt in states short of breath since Monday. Pt standing and walking very short of breath. Pt uses cpap at night and helps sob some. Pt had some chills and subjective fever. Pt has no chest pain. No arm pain.   Pt also had some recent tooth infection rt upper side. Pt was given antibiotic levofloxin on October 07, 2017. Pt states her endocrine MD and dentist decided on using. Pt went to dentist for tooth procedure but on lying supine at ED her bp was 119/46. So dental procedure was canceled.  Pt felt weak and vomited. Pt had upper abdomen uncomfortable sensation.   Pt weight is stable.   Unable to walk even minimal short distance. Got very light headed and felt like about to collapse. But o2 sat% in mid 90's and pulse mid 80's. Pt feels like at severe head pressure lying supine. Also when walking described severe head pressure  Then almost had near syncope in office. Was dizzy at that time.   She also notes abdomen pain recent as well with some back pain.   Review of Systems  Constitutional: Positive for chills, fatigue and fever.       Subjective fever.  Respiratory: Positive for shortness of breath. Negative for cough, chest tightness and wheezing.   Cardiovascular: Negative for chest pain and palpitations.  Gastrointestinal: Positive for abdominal pain. Negative for blood in stool, constipation, diarrhea, nausea and vomiting.  Genitourinary: Negative for difficulty urinating and dysuria.  Musculoskeletal: Negative for back pain, gait problem, myalgias and neck stiffness.  Skin: Negative for rash.  Neurological: Positive for dizziness. Negative for syncope, speech difficulty, weakness and headaches.       Head pressure and near syncope recently on ambulation.  Hematological: Negative for adenopathy. Does not bruise/bleed easily.  Psychiatric/Behavioral: Negative for  behavioral problems, confusion, sleep disturbance and suicidal ideas.   Past Medical History:  Diagnosis Date  . Allergy   . Anxiety    Dr Madaline Guthrie  . Asthma   . Atypical seizure (HCC) 06/07/2012  . Depression    Dr Madaline Guthrie  . Diabetes mellitus   . DVT (deep venous thrombosis) (HCC)   . Headache(784.0)   . Hyperlipidemia   . Hypersomnia with sleep apnea, unspecified 05/22/2014  . Olfactory aura   . OSA on CPAP   . Shingles 07/2013     Social History   Social History  . Marital status: Married    Spouse name: Lars Mage  . Number of children: 2  . Years of education: College   Occupational History  . Not on file.   Social History Main Topics  . Smoking status: Never Smoker  . Smokeless tobacco: Never Used  . Alcohol use 0.0 oz/week     Comment: occ  . Drug use: No  . Sexual activity: Not on file   Other Topics Concern  . Not on file   Social History Narrative   Patient is married Lars Mage) and lives at home with her husband.   Patient has two adult children.   Patient is retired.   Patient has a college education.   Patient is right-handed.   Patient drinks one cup of tea daily.    Past Surgical History:  Procedure Laterality Date  . APPENDECTOMY    . CHOLECYSTECTOMY    . COLONOSCOPY  2006   [facility]  . rotator cuff surgery    . TONSILLECTOMY       Family History  Problem Relation Age of Onset  . Stomach cancer Father   . Coronary artery disease Father   . Breast cancer Sister     breast cancer  . Diabetes Maternal Aunt   . Diabetes Paternal Aunt   . Prostate cancer Brother   . Stroke Neg Hx     Allergies  Allergen Reactions  . Mushroom Ext Cmplx(Shiitake-Reishi-Mait) Anaphylaxis  . Shellfish Allergy Anaphylaxis  . Sulfonamide Derivatives Anaphylaxis    unknown  . Iodinated Diagnostic Agents     Other reaction(s): Other (See Comments) Bad head ahce  . Acetaminophen     REACTION: STOMACH DISCOMFORT  . Butalbital-Aspirin-Caffeine     unknown  .  Clarithromycin Nausea And Vomiting  . Fluticasone-Salmeterol     Feels like something in the throat  . Montelukast Sodium     unknown  . Moxifloxacin     unknown  . Orphenadrine Nausea And Vomiting  . Orphenadrine Citrate     unknown  . Penicillins     unknown    Current Outpatient Prescriptions on File Prior to Visit  Medication Sig Dispense Refill  . Acetaminophen (TYLENOL EXTRA STRENGTH PO) Take 1 tablet by mouth as needed.    . diazepam (VALIUM) 5 MG tablet Take 1 tablet PO 30 minutes prior to procedure - may repeat x1 2 tablet 0  . metFORMIN (GLUCOPHAGE-XR) 750 MG 24 hr tablet 1 tablet daily. Taken if CBG is >120 two days in a row     No current facility-administered medications on file prior to visit.     BP 121/90 (BP Location: Left Arm, Cuff Size: Large)   Ht 4\' 11"  (1.499 m)   Wt 261 lb (118.4 kg)   BMI 52.72 kg/m       Objective:   Physical Exam   General Mental Status- Alert. General Appearance- Not in acute distress.   Skin General: Color- Normal Color. Moisture- Normal Moisture.  Neck Carotid Arteries- Normal color. Moisture- Normal Moisture. No carotid bruits. No JVD.  Chest and Lung Exam Auscultation: Breath Sounds:-Normal. Cta.(but severe sob on very short ambulation).  Cardiovascular Auscultation:Rythm- Regular. Murmurs & Other Heart Sounds:Auscultation of the heart reveals- No Murmurs.  Abdomen Inspection:-Inspeection Normal. Palpation/Percussion:Note:No mass. Palpation and Percussion of the abdomen reveal- Non Tender, Non Distended + BS, no rebound or guarding.    Neurologic Cranial Nerve exam:- CN III-XII  Grossly intact.(near syncope on short ambulation)  Lower ext-  Faint 1+ pedal edema.       Assessment & Plan:  ekg looks nsr. Some artifact. V1, v2, v3 looks flat. v1 maybe t wave inversion lead 1.  For your near syncope in office, severe dyspnea on ambulation and other symptoms, we need to send you down stairs to be in  evaluated in the ED. This is best course of action as we have weekend upcoming and it is best to get stat work up and treatment.  Other symptonms since Monday fatigue, chills, nauseau, and head pressure.   Regarding pt recent dyspnea on ambulation she does have history of dvt and PE.  Notified the ED MD of pt presentation.  Pt sent down with staff in wheel chair to risk of fall/possible syncope.  Note pt was advised to go to ED before she came here.  Follow up date to be determined by ED.  Bryley Kovacevic, Ramon Dredge, PA-C

## 2016-12-20 ENCOUNTER — Ambulatory Visit (HOSPITAL_BASED_OUTPATIENT_CLINIC_OR_DEPARTMENT_OTHER)
Admission: RE | Admit: 2016-12-20 | Discharge: 2016-12-20 | Disposition: A | Discharge status: Home / Self Care | Payer: Medicare Other | Source: Ambulatory Visit | Attending: Medical | Admitting: Medical

## 2016-12-20 ENCOUNTER — Telehealth: Payer: Self-pay | Admitting: Medical

## 2016-12-20 ENCOUNTER — Ambulatory Visit (INDEPENDENT_AMBULATORY_CARE_PROVIDER_SITE_OTHER): Payer: Medicare Other | Admitting: Medical

## 2016-12-20 ENCOUNTER — Encounter: Payer: Self-pay | Admitting: Medical

## 2016-12-20 ENCOUNTER — Telehealth: Payer: Self-pay | Admitting: Family Medicine

## 2016-12-20 ENCOUNTER — Ambulatory Visit: Payer: Medicare Other | Admitting: Internal Medicine

## 2016-12-20 VITALS — BP 134/88 | HR 64 | Temp 98.2°F | Resp 16 | Ht 59.0 in | Wt 261.0 lb

## 2016-12-20 DIAGNOSIS — R0600 Dyspnea, unspecified: Secondary | ICD-10-CM

## 2016-12-20 DIAGNOSIS — K5900 Constipation, unspecified: Secondary | ICD-10-CM

## 2016-12-20 DIAGNOSIS — Z8709 Personal history of other diseases of the respiratory system: Secondary | ICD-10-CM | POA: Diagnosis not present

## 2016-12-20 DIAGNOSIS — R42 Dizziness and giddiness: Secondary | ICD-10-CM

## 2016-12-20 DIAGNOSIS — R8299 Other abnormal findings in urine: Secondary | ICD-10-CM

## 2016-12-20 DIAGNOSIS — R51 Headache: Secondary | ICD-10-CM | POA: Diagnosis not present

## 2016-12-20 DIAGNOSIS — R82998 Other abnormal findings in urine: Secondary | ICD-10-CM

## 2016-12-20 DIAGNOSIS — R519 Headache, unspecified: Secondary | ICD-10-CM

## 2016-12-20 MED ORDER — ALBUTEROL SULFATE HFA 108 (90 BASE) MCG/ACT IN AERS: 2.0000 | INHALATION_SPRAY | Freq: Four times a day (QID) | RESPIRATORY_TRACT | 2 refills | Status: DC | PRN

## 2016-12-20 NOTE — Progress Notes (Signed)
Pre visit review using our clinic review tool, if applicable. No additional management support is needed unless otherwise documented below in the visit note/SLS Patient states that her Ventolin HFA is expired, new order to pharmacy/SLS 02/12

## 2016-12-20 NOTE — Patient Instructions (Addendum)
For your dyspnea on exertion will refer to pulmonologsit and cardiologist. Will try to prioritize lung doctor first.  For your sob I want you to use 2 inhalation every 6 hours and give [place] update if this helps your breathing.  For recent constipation will get xray of your abdomen today. Keep yourself well hydrated.   Get urine culture today.  Call your neurologist for appointment. If takes more than 3 weeks then let us know and our staff can call and get you in.  Will put in order to get CT of your head today.  Follow up in 7 days or as needed

## 2016-12-20 NOTE — Telephone Encounter (Signed)
error:[id number] ° °

## 2016-12-20 NOTE — Progress Notes (Signed)
Subjective:    Patient ID: Nicole Mccann, female    DOB: 1946/08/27, 71 y.o.   MRN: [id number]  HPI  Pt in still feeling some weakness and shortness of breath since last visit. Occasionally some light headed and dizziness. Pt stomach feels bloated. Overall better than Friday but not much. Pt does hot have headache now but has had on and off ha since friday. But not presently.  Pt describes feeling very sob with minimal acitivity .  Not reporting chest pain.  Pt feels some constipation recently. She states small hard movements. Pt states last Thursday bowel movement that was somewhat normal after using otc medication.  Pt last Friday had near syncope and sob on ambulation. She was seen on Friday for this. Her troponin was negative in ED. Pt kidney function and electrolytes looked ok.Pt cbc was good. No infection fighting cells and nt anemia. Pt d-dimer test was not elevated.  Urine looked cloudy and some wbc. No culture done.   Pt does have history of asthma in past. Some second hand smoke history in past but remote in 1980's when husband smoked.  Pt has appointment neurologist. Pt states neurologist did sleep study on her in the past.   Dizziness daily with intermittent ha every day. Ha intensity varies.  cxr was negative on Friday.    Review of Systems  Constitutional: Positive for fatigue. Negative for chills and fever.  HENT: Negative for congestion, drooling, ear pain, hearing loss, nosebleeds and postnasal drip.   Respiratory: Positive for shortness of breath. Negative for cough, chest tightness and wheezing.   Cardiovascular: Negative for chest pain and palpitations.  Gastrointestinal: Positive for constipation. Negative for abdominal pain, diarrhea, nausea, rectal pain and vomiting.  Genitourinary: Negative for dyspareunia, enuresis, flank pain and frequency.  Musculoskeletal: Negative for back pain, myalgias and neck stiffness.  Skin: Negative for rash.    Neurological: Positive for dizziness and weakness. Negative for light-headedness, numbness and headaches.  Hematological: Negative for adenopathy. Does not bruise/bleed easily.  Psychiatric/Behavioral: Negative for behavioral problems, confusion and sleep disturbance. The patient is not nervous/anxious.     Past Medical History:  Diagnosis Date  . Allergy   . Anxiety    Dr Madaline Guthrie  . Asthma   . Atypical seizure (HCC) 06/07/2012  . Depression    Dr Madaline Guthrie  . Diabetes mellitus   . DVT (deep venous thrombosis) (HCC)   . Headache(784.0)   . Hyperlipidemia   . Hypersomnia with sleep apnea, unspecified 05/22/2014  . Olfactory aura   . OSA on CPAP   . Shingles 07/2013     Social History   Social History  . Marital status: Married    Spouse name: Lars Mage  . Number of children: 2  . Years of education: College   Occupational History  . Not on file.   Social History Main Topics  . Smoking status: Never Smoker  . Smokeless tobacco: Never Used  . Alcohol use 0.0 oz/week     Comment: occ  . Drug use: No  . Sexual activity: Not on file   Other Topics Concern  . Not on file   Social History Narrative   Patient is married Lars Mage) and lives at home with her husband.   Patient has two adult children.   Patient is retired.   Patient has a college education.   Patient is right-handed.   Patient drinks one cup of tea daily.    Past Surgical History:  Procedure Laterality Date  .  APPENDECTOMY    . CHOLECYSTECTOMY    . COLONOSCOPY  2006   [facility]  . rotator cuff surgery    . TONSILLECTOMY       Family History  Problem Relation Age of Onset  . Stomach cancer Father   . Coronary artery disease Father   . Breast cancer Sister     breast cancer  . Diabetes Maternal Aunt   . Diabetes Paternal Aunt   . Prostate cancer Brother   . Stroke Neg Hx     Allergies  Allergen Reactions  . Mushroom Ext Cmplx(Shiitake-Reishi-Mait) Anaphylaxis  . Shellfish Allergy Anaphylaxis   . Sulfonamide Derivatives Anaphylaxis    unknown  . Iodinated Diagnostic Agents     Other reaction(s): Other (See Comments) Bad head ahce  . Acetaminophen     REACTION: STOMACH DISCOMFORT  . Butalbital-Aspirin-Caffeine     unknown  . Clarithromycin Nausea And Vomiting  . Fluticasone-Salmeterol     Feels like something in the throat  . Montelukast Sodium     unknown  . Moxifloxacin     unknown  . Orphenadrine Nausea And Vomiting  . Orphenadrine Citrate     unknown  . Penicillins     unknown    Current Outpatient Prescriptions on File Prior to Visit  Medication Sig Dispense Refill  . Acetaminophen (TYLENOL EXTRA STRENGTH PO) Take 1 tablet by mouth as needed.    . metFORMIN (GLUCOPHAGE-XR) 750 MG 24 hr tablet 1 tablet daily. Taken if CBG is >120 two days in a row    . diazepam (VALIUM) 5 MG tablet Take 1 tablet PO 30 minutes prior to procedure - may repeat x1 (Patient not taking: Reported on 12/20/2016) 2 tablet 0   No current facility-administered medications on file prior to visit.     BP 134/88 (BP Location: Left Arm, Patient Position: Sitting, Cuff Size: Large)   Pulse 64   Temp 98.2 F (36.8 C) (Oral)   Resp 16   Ht 4\' 11"  (1.499 m)   Wt 261 lb (118.4 kg)   SpO2 98%   BMI 52.72 kg/m       Objective:   Physical Exam  General Mental Status- Alert. General Appearance- Not in acute distress.   Skin General: Color- Normal Color. Moisture- Normal Moisture.  Neck Carotid Arteries- Normal color. Moisture- Normal Moisture. No carotid bruits. No JVD.  Chest and Lung Exam Auscultation: Breath Sounds:-Normal but shallow and describes and appears sob with mimimal activity.  Cardiovascular Auscultation:Rythm- Regular. Murmurs & Other Heart Sounds:Auscultation of the heart reveals- No Murmurs.  Abdomen Inspection:-Inspeection Normal. Palpation/Percussion:Note:No mass. Palpation and Percussion of the abdomen reveal- Non Tender, Non Distended + BS, no rebound or  guarding.    Neurologic Cranial Nerve exam:- CN III-XII intact(No nystagmus), symmetric smile. Strength:- 5/5 equal and symmetric strength both upper and lower extremities.   Lower ext- no pedal edema and negative homans signs.     Assessment & Plan:  For your dyspnea on exertion will refer to pulmonologsit and cardiologist. Will try to prioritize lung doctor first.  For your sob I want you to use 2 inhalation every 6 hours and give [place] update if this helps your breathing.  For recent constipation will get xray of your abdomen today. Keep yourself well hydrated.   Get urine culture today.  Call your neurologist for appointment. If takes more than 3 weeks then let us know and our staff can call and get you in.  Will put in order  to get CT of your head today.  Follow up in 7 days or as needed  Total 40 minutes taken or longer with pt discussing potential differential dx, plan, referrals and work up to be done.  Abdalrahman Clementson, Ramon Dredge, PA-C

## 2016-12-20 NOTE — Telephone Encounter (Signed)
Will you refer pt to pulmonologist first. Then cardiologist afterwards.

## 2016-12-21 ENCOUNTER — Ambulatory Visit (INDEPENDENT_AMBULATORY_CARE_PROVIDER_SITE_OTHER): Payer: Medicare Other | Admitting: Cardiovascular Disease

## 2016-12-21 ENCOUNTER — Encounter: Payer: Self-pay | Admitting: Cardiovascular Disease

## 2016-12-21 VITALS — BP 133/70 | HR 79 | Ht 59.0 in | Wt 261.6 lb

## 2016-12-21 DIAGNOSIS — R0600 Dyspnea, unspecified: Secondary | ICD-10-CM | POA: Diagnosis not present

## 2016-12-21 DIAGNOSIS — R0602 Shortness of breath: Secondary | ICD-10-CM | POA: Diagnosis not present

## 2016-12-21 LAB — URINE CULTURE

## 2016-12-21 NOTE — Telephone Encounter (Signed)
Will do!

## 2016-12-21 NOTE — Progress Notes (Signed)
12/21/2016 Nicole Mccann   1946-06-28  [id number]  Primary Physician Abbe Amsterdam, MD Primary Cardiologist: Runell Gess MD Roseanne Reno  HPI:  Nicole Mccann is a delightful 71 year old mildly overweight married [place] female mother of 2 children accompanied by her husband Nicole Mccann today. She has a history of nonobstructive diabetes, untreated hyperlipidemia, reactive airways disease and obstructive sleep apnea. A week ago she developed a oral infection and was treated with antibiotics by her dentist. She also was placed on analgesics for back pain. She's developed progressive shortness of breath and some dizziness. She was seen in the emergency room where workup was unrevealing. EKG showed no acute changes. Chest x-ray was clear.   Current Outpatient Prescriptions  Medication Sig Dispense Refill  . Acetaminophen (TYLENOL EXTRA STRENGTH PO) Take 1 tablet by mouth as needed.    Marland Kitchen albuterol (PROVENTIL HFA;VENTOLIN HFA) 108 (90 Base) MCG/ACT inhaler Inhale 2 puffs into the lungs every 6 (six) hours as needed for wheezing or shortness of breath. VENTOLIN HFA 1 Inhaler 2  . metFORMIN (GLUCOPHAGE-XR) 750 MG 24 hr tablet 1 tablet daily. Taken if CBG is >120 two days in a row     No current facility-administered medications for this visit.     Allergies  Allergen Reactions  . Mushroom Ext Cmplx(Shiitake-Reishi-Mait) Anaphylaxis  . Shellfish Allergy Anaphylaxis  . Sulfonamide Derivatives Anaphylaxis    unknown  . Iodinated Diagnostic Agents     Other reaction(s): Other (See Comments) Bad head ahce  . Acetaminophen     REACTION: STOMACH DISCOMFORT  . Butalbital-Aspirin-Caffeine     unknown  . Clarithromycin Nausea And Vomiting  . Fluticasone-Salmeterol     Feels like something in the throat  . Levofloxacin Other (See Comments)  . Montelukast Sodium     unknown  . Moxifloxacin     unknown  . Orphenadrine Nausea And Vomiting  . Orphenadrine Citrate    unknown  . Penicillins     unknown    Social History   Social History  . Marital status: Married    Spouse name: Nicole Mccann  . Number of children: 2  . Years of education: College   Occupational History  . Not on file.   Social History Main Topics  . Smoking status: Never Smoker  . Smokeless tobacco: Never Used  . Alcohol use 0.0 oz/week     Comment: occ  . Drug use: No  . Sexual activity: Not on file   Other Topics Concern  . Not on file   Social History Narrative   Patient is married Nicole Mccann) and lives at home with her husband.   Patient has two adult children.   Patient is retired.   Patient has a college education.   Patient is right-handed.   Patient drinks one cup of tea daily.     Review of Systems: General: negative for chills, fever, night sweats or weight changes.  Cardiovascular: negative for chest pain, dyspnea on exertion, edema, orthopnea, palpitations, paroxysmal nocturnal dyspnea or shortness of breath Dermatological: negative for rash Respiratory: negative for cough or wheezing Urologic: negative for hematuria Abdominal: negative for nausea, vomiting, diarrhea, bright red blood per rectum, melena, or hematemesis Neurologic: negative for visual changes, syncope, or dizziness All other systems reviewed and are otherwise negative except as noted above.    Blood pressure 133/70, pulse 79, height 4\' 11"  (1.499 m), weight 261 lb 9.6 oz (118.7 kg), SpO2 98 %.  General appearance: alert and no distress Neck:  no adenopathy, no carotid bruit, no JVD, supple, symmetrical, trachea midline and thyroid not enlarged, symmetric, no tenderness/mass/nodules Lungs: clear to auscultation bilaterally Heart: regular rate and rhythm, S1, S2 normal, no murmur, click, rub or gallop Extremities: extremities normal, atraumatic, no cyanosis or edema  EKG not performed today  ASSESSMENT AND PLAN:   Shortness of breath Ms. Immel was referred here for new onset shortness of  breath. She does have a history of reactive airway disease and obstructive sleep apnea. Her symptoms became more noticeable week ago after being put on antibiotics for an oral infection by her dentist. She was seen in the emergency room and by her primary care physician. Chest x-ray was unremarkable. Her exam is otherwise benign. I am going to check a 2-D echocardiogram.      Runell Gess MD [facility], [facility] 12/21/2016 1:57 PM

## 2016-12-21 NOTE — Assessment & Plan Note (Signed)
Nicole Mccann was referred here for new onset shortness of breath. She does have a history of reactive airway disease and obstructive sleep apnea. Her symptoms became more noticeable week ago after being put on antibiotics for an oral infection by her dentist. She was seen in the emergency room and by her primary care physician. Chest x-ray was unremarkable. Her exam is otherwise benign. I am going to check a 2-D echocardiogram.

## 2016-12-21 NOTE — Patient Instructions (Signed)
Medication Instructions: Your physician recommends that you continue on your current medications as directed. Please refer to the Current Medication list given to you today.  Testing/Procedures: Your physician has requested that you have an echocardiogram. Echocardiography is a painless test that uses sound waves to create images of your heart. It provides your doctor with information about the size and shape of your heart and how well your heart's chambers and valves are working. This procedure takes approximately one hour. There are no restrictions for this procedure.  Follow-Up: I will call you with the results of the echo.

## 2016-12-22 ENCOUNTER — Ambulatory Visit (HOSPITAL_COMMUNITY): Admission: RE | Admit: 2016-12-22 | Payer: Medicare Other | Source: Ambulatory Visit

## 2016-12-22 ENCOUNTER — Ambulatory Visit (INDEPENDENT_AMBULATORY_CARE_PROVIDER_SITE_OTHER): Payer: Medicare Other | Admitting: Internal Medicine

## 2016-12-22 ENCOUNTER — Encounter: Payer: Self-pay | Admitting: Internal Medicine

## 2016-12-22 ENCOUNTER — Ambulatory Visit (HOSPITAL_BASED_OUTPATIENT_CLINIC_OR_DEPARTMENT_OTHER)
Admission: RE | Admit: 2016-12-22 | Discharge: 2016-12-22 | Disposition: A | Discharge status: Home / Self Care | Payer: Medicare Other | Source: Ambulatory Visit | Attending: Cardiovascular Disease | Admitting: Cardiovascular Disease

## 2016-12-22 ENCOUNTER — Telehealth: Payer: Self-pay | Admitting: Medical

## 2016-12-22 VITALS — BP 134/82 | HR 75 | Ht 59.0 in | Wt 261.0 lb

## 2016-12-22 DIAGNOSIS — R0689 Other abnormalities of breathing: Secondary | ICD-10-CM | POA: Diagnosis not present

## 2016-12-22 DIAGNOSIS — R42 Dizziness and giddiness: Secondary | ICD-10-CM

## 2016-12-22 DIAGNOSIS — R0600 Dyspnea, unspecified: Secondary | ICD-10-CM

## 2016-12-22 DIAGNOSIS — I501 Left ventricular failure: Secondary | ICD-10-CM | POA: Diagnosis not present

## 2016-12-22 HISTORY — DX: Dizziness and giddiness: R42

## 2016-12-22 LAB — ECHOCARDIOGRAM COMPLETE
HEIGHTINCHES: 59 in
Weight: 4176 oz

## 2016-12-22 LAB — NITRIC OXIDE: Nitric Oxide: 18

## 2016-12-22 NOTE — Telephone Encounter (Signed)
Opened to review 

## 2016-12-22 NOTE — Patient Instructions (Addendum)
ICD-9-CM ICD-10-CM   1. Dyspnea and respiratory abnormality 786.09 R06.00     R06.89   2. Dizziness and giddiness 780.4 R42    Do VQ scan 12/22/2016 or 12/23/16 Finish echo as scheduled If worse go to ER Will call with results to advise next step

## 2016-12-22 NOTE — Progress Notes (Signed)
Subjective:    Patient ID: Nicole Mccann, female    DOB: 28-Sep-1946, 71 y.o.   MRN: [id number]  PCP Nicole Amsterdam, MD   HPI  IOV 12/22/2016  Chief Complaint  Patient presents with  . Pulmonary Consult    Pt referred by Nicole. Esperanza Mccann for dyspnea. Pt c/o dyspnea with rest and activity. Pt deneis cough and CP/tightness.     Nicole Mccann isaccompanied by her husband. History is from talking to the patient, her husband and review of the referral notes and overall everything that is present to summarize from this combination   She 71 years old and morbidly obese. She has been using a walker for the last several months because of chronic back pain and this use of walker has gotten increasingly more in the last month or so. She is originally from [place] having immigrated to the [place] in 1964 and to [place] area in 1977. She tells me that when she was 71 years old she had asthma diagnosed in this only periodically bothered her. Now starting 12/14/2016 Tuesday she had abrupt onset of shortness of breath associated with dizziness. This is being evaluated for visits by the physician assistant in her primary care's office. The first one on 12/17/2016; she wwas sent to ER. There was abdominal bloating and the second one on 12/20/2016. She has been subjected to substantial workup that included a chest x-ray, blood work and CT head and these are all noncontributory and unremarkable and documented below. She continues to complain of persistent symptoms. There are severe enough that she is in tears in the office. It is associated with dizziness. She feels this is different from her asthma. It is not associated with cough or wheezing. This no associated leg swelling is no associated chest pain. An echocardiogram is pending today @ Nicole Mccann office.    Walking desaturation tsest 185 feet x 3 laps on RA: with walker did only 2 laps and stopped due to arthraliga. Lowest pulse ox 96%,  PEak HR 88/min  Feno 12/22/2016 -> 18 ppb and normal  ....................Marland Kitchen D-dimer 12/17/16 - normal 0.35  Trop - normal 12/17/16   Chest x-ray 2 view in 2/92018: Personally visualized clear lung fields  CT head 12/20/16: negative per report   Results for Nicole Mccann (MRN [id number]) as of 12/22/2016 11:10  Ref. Range 12/17/2016 15:49 12/17/2016 15:58  Hemoglobin Latest Ref Range: 12.0 - 15.0 g/dL  78.2   Results for Nicole Mccann (MRN [id number]) as of 12/22/2016 11:10  Ref. Rang  e 12/17/2016 15:49 12/17/2016 15:58  Creatinine Latest Ref Range: 0.44 - 1.00 mg/dL  5.78     4696 - vascular duplex - neg for DVT in lowers  Allergies  Allergen Reactions  . Mushroom Ext Cmplx(Shiitake-Reishi-Mait) Anaphylaxis  . Shellfish Allergy Anaphylaxis  . Sulfonamide Derivatives Anaphylaxis    unknown  . Iodinated Diagnostic Agents     Other reaction(s): Other (See Comments) Bad head ahce  . Acetaminophen     REACTION: STOMACH DISCOMFORT  . Butalbital-Aspirin-Caffeine     unknown  . Clarithromycin Nausea And Vomiting  . Fluticasone-Salmeterol     Feels like something in the throat  . Levofloxacin Other (See Comments)  . Montelukast Sodium     unknown  . Moxifloxacin     unknown  . Orphenadrine Nausea And Vomiting  . Orphenadrine Citrate     unknown  . Penicillins     unknown      has a  past medical history of Allergy; Anxiety; Asthma; Atypical seizure (HCC) (06/07/2012); Depression; Diabetes mellitus; DVT (deep venous thrombosis) (HCC); Headache(784.0); Hyperlipidemia; Hypersomnia with sleep apnea, unspecified (05/22/2014); Olfactory aura; OSA on CPAP; and Shingles (07/2013).   reports that she has never smoked. She has never used smokeless tobacco.  Past Surgical History:  Procedure Laterality Date  . APPENDECTOMY    . CHOLECYSTECTOMY    . COLONOSCOPY  2006   [facility]  . rotator cuff surgery    . TONSILLECTOMY       Allergies  Allergen Reactions  . Mushroom Ext  Cmplx(Shiitake-Reishi-Mait) Anaphylaxis  . Shellfish Allergy Anaphylaxis  . Sulfonamide Derivatives Anaphylaxis    unknown  . Iodinated Diagnostic Agents     Other reaction(s): Other (See Comments) Bad head ahce  . Acetaminophen     REACTION: STOMACH DISCOMFORT  . Butalbital-Aspirin-Caffeine     unknown  . Clarithromycin Nausea And Vomiting  . Fluticasone-Salmeterol     Feels like something in the throat  . Levofloxacin Other (See Comments)  . Montelukast Sodium     unknown  . Moxifloxacin     unknown  . Orphenadrine Nausea And Vomiting  . Orphenadrine Citrate     unknown  . Penicillins     unknown    Immunization History  Administered Date(s) Administered  . Influenza Split 07/25/2012  . Influenza Whole 09/27/2007, 08/21/2008, 09/17/2009  . Influenza,inj,Quad PF,36+ Mos 07/23/2015, 08/08/2016  . Pneumococcal Conjugate-13 03/13/2015  . Td 11/08/2001  . Tdap 08/17/2013    Family History  Problem Relation Age of Onset  . Stomach cancer Father   . Coronary artery disease Father   . Breast cancer Sister     breast cancer  . Diabetes Maternal Aunt   . Diabetes Paternal Aunt   . Prostate cancer Brother   . Stroke Neg Hx      Current Outpatient Prescriptions:  .  Acetaminophen (TYLENOL EXTRA STRENGTH PO), Take 1 tablet by mouth as needed., Disp: , Rfl:  .  albuterol (PROVENTIL HFA;VENTOLIN HFA) 108 (90 Base) MCG/ACT inhaler, Inhale 2 puffs into the lungs every 6 (six) hours as needed for wheezing or shortness of breath. VENTOLIN HFA, Disp: 1 Inhaler, Rfl: 2 .  metFORMIN (GLUCOPHAGE-XR) 750 MG 24 hr tablet, 1 tablet daily. Taken if CBG is >120 two days in a row, Disp: , Rfl:     Review of Systems  Constitutional: Negative for fever and unexpected weight change.  HENT: Negative for congestion, dental problem, ear pain, nosebleeds, postnasal drip, rhinorrhea, sinus pressure, sneezing, sore throat and trouble swallowing.   Eyes: Negative for redness and itching.    Respiratory: Positive for shortness of breath. Negative for cough, chest tightness and wheezing.   Cardiovascular: Negative for palpitations and leg swelling.  Gastrointestinal: Negative for nausea and vomiting.  Genitourinary: Negative for dysuria.  Musculoskeletal: Negative for joint swelling.  Skin: Negative for rash.  Neurological: Positive for dizziness. Negative for headaches.  Hematological: Does not bruise/bleed easily.  Psychiatric/Behavioral: Negative for dysphoric mood. The patient is not nervous/anxious.        Objective:   Physical Exam  Constitutional: She is oriented to person, place, and time. She appears well-developed and well-nourished. No distress.  obese  HENT:  Head: Normocephalic and atraumatic.  Right Ear: External ear normal.  Left Ear: External ear normal.  Mouth/Throat: Oropharynx is clear and moist. No oropharyngeal exudate.  Crying and in tears  Eyes: Conjunctivae and EOM are normal. Pupils are equal, round, and reactive  to light. Right eye exhibits no discharge. Left eye exhibits no discharge. No scleral icterus.  Neck: Normal range of motion. Neck supple. No JVD present. No tracheal deviation present. No thyromegaly present.  No neck nodes  Cardiovascular: Normal rate, regular rhythm, normal heart sounds and intact distal pulses.  Exam reveals no gallop and no friction rub.   No murmur heard. Pulmonary/Chest: Effort normal and breath sounds normal. No respiratory distress. She has no wheezes. She has no rales. She exhibits no tenderness.  Abdominal: Soft. Bowel sounds are normal. She exhibits no distension and no mass. There is no tenderness. There is no rebound and no guarding.  Visceral obesity +  Musculoskeletal: Normal range of motion. She exhibits no edema or tenderness.  No calf warmth  Lymphadenopathy:    She has no cervical adenopathy.  Neurological: She is alert and oriented to person, place, and time. She has normal reflexes. No cranial  nerve deficit. She exhibits normal muscle tone. Coordination normal.  Skin: Skin is warm and dry. No rash noted. She is not diaphoretic. No erythema. No pallor.  dry  Psychiatric: She has a normal mood and affect. Her behavior is normal. Judgment and thought content normal.  Vitals reviewed.  Vitals:   12/22/16 1105  BP: 134/82  Pulse: 75  SpO2: 98%  Weight: 261 lb (118.4 kg)  Height: 4\' 11"  (1.499 m)    Estimated body mass index is 52.72 kg/m as calculated from the following:   Height as of this encounter: 4\' 11"  (1.499 m).   Weight as of this encounter: 261 lb (118.4 kg).        Assessment & Plan:     ICD-9-CM ICD-10-CM   1. Dyspnea and respiratory abnormality 786.09 R06.00     R06.89   2. Dizziness and giddiness 780.4 R42    I am  not fully sure as to the reason for the abrupt dyspnea and worsening associated dizziness. D-dimer is normal making risk of pulmonary embolism low. Nevertheless she is obese and she has become increasingly sedentary because of back pain. Therefore the possibly of falls negative d-dimer excess. She cannot do a CT angiogram chest because of IV dye allergy. We will proceed with a VQ scan which she says she might not be able to do today. She knows that if she gets worse she will go to the emergency department. She and her husband fully updated of the plan. If the VQ scan is low probably are normal then we will go ahead and get a high-resolution CT chest without contrast depending on  On  echocardiogram results.   Nicole. Kalman Shan, M.D., [facility].C.P Pulmonary and Critical Care Medicine Staff Physician [facility] Pager: [phone], If no answer or between  15:00h - 7:00h: call [phone]  12/22/2016 11:42 AM

## 2016-12-22 NOTE — Addendum Note (Signed)
Addended by: Sheran Luz on: 12/22/2016 12:59 PM   Modules accepted: Orders

## 2016-12-28 ENCOUNTER — Ambulatory Visit (HOSPITAL_COMMUNITY)
Admission: RE | Admit: 2016-12-28 | Discharge: 2016-12-28 | Disposition: A | Discharge status: Home / Self Care | Payer: Medicare Other | Source: Ambulatory Visit | Attending: Internal Medicine | Admitting: Internal Medicine

## 2016-12-28 ENCOUNTER — Telehealth: Payer: Self-pay | Admitting: Internal Medicine

## 2016-12-28 DIAGNOSIS — I700 Atherosclerosis of aorta: Secondary | ICD-10-CM | POA: Insufficient documentation

## 2016-12-28 DIAGNOSIS — R938 Abnormal findings on diagnostic imaging of other specified body structures: Secondary | ICD-10-CM | POA: Insufficient documentation

## 2016-12-28 DIAGNOSIS — R0609 Other forms of dyspnea: Secondary | ICD-10-CM | POA: Insufficient documentation

## 2016-12-28 DIAGNOSIS — R0689 Other abnormalities of breathing: Principal | ICD-10-CM

## 2016-12-28 DIAGNOSIS — R0600 Dyspnea, unspecified: Secondary | ICD-10-CM | POA: Diagnosis present

## 2016-12-28 MED ORDER — TECHNETIUM TO 99M ALBUMIN AGGREGATED
4.3100 | Freq: Once | INTRAVENOUS | Status: AC | PRN
  Administered 2016-12-28: 4.31 via INTRAVENOUS

## 2016-12-28 MED ORDER — TECHNETIUM TC 99M DIETHYLENETRIAME-PENTAACETIC ACID
33.0000 | Freq: Once | INTRAVENOUS | Status: AC | PRN
  Administered 2016-12-28: 33 via INTRAVENOUS

## 2016-12-28 NOTE — Telephone Encounter (Signed)
Received call report for pt's CXR that was performed today 12/28/16. Impression is below. VQ scan looks as though this has also been done today 12/28/16.   MR please advise. Thanks.    IMPRESSION: There is no acute cardiopulmonary abnormality.  Thoracic aortic atherosclerosis.  These results will be called to the ordering clinician or representative by the Radiologist Assistant, and communication documented in the PACS or zVision Dashboard.

## 2016-12-29 NOTE — Telephone Encounter (Signed)
Low prob vq scan and in setting of recent d-dimer normal. I do not think PE is cause of dyspnea. She needs to return for fu to see an APP; might have to consider CPST or nuclear medicine stress test. Please give appt in 1-2 weeks

## 2016-12-29 NOTE — Telephone Encounter (Signed)
[id number] x1 

## 2016-12-30 ENCOUNTER — Ambulatory Visit (INDEPENDENT_AMBULATORY_CARE_PROVIDER_SITE_OTHER): Payer: Medicare Other | Admitting: Medical

## 2016-12-30 ENCOUNTER — Encounter: Payer: Self-pay | Admitting: Medical

## 2016-12-30 VITALS — BP 128/78 | HR 79 | Temp 98.5°F | Ht 59.0 in | Wt 263.2 lb

## 2016-12-30 DIAGNOSIS — R42 Dizziness and giddiness: Secondary | ICD-10-CM

## 2016-12-30 DIAGNOSIS — R55 Syncope and collapse: Secondary | ICD-10-CM | POA: Diagnosis not present

## 2016-12-30 DIAGNOSIS — R5383 Other fatigue: Secondary | ICD-10-CM | POA: Diagnosis not present

## 2016-12-30 DIAGNOSIS — R0600 Dyspnea, unspecified: Secondary | ICD-10-CM | POA: Diagnosis not present

## 2016-12-30 NOTE — Telephone Encounter (Signed)
Spoke with pt's husband. He is aware of results. States that they will have her PCP handle her SOB. They declined making an appointment with our office at this time.

## 2016-12-30 NOTE — Progress Notes (Signed)
Pre visit review using our clinic review tool, if applicable. No additional management support is needed unless otherwise documented below in the visit note. 

## 2016-12-30 NOTE — Progress Notes (Signed)
Subjective:    Patient ID: Nicole Mccann, female    DOB: [birth year], 71 y.o.   MRN: [id number]  HPI  Pt in for follow up.   Pt had weakness, light headed, and shortness of breath. She was having very poor exercise tolerance. She was having profound symptoms and had to work up at the ED. I sent her down to ED. No cause found. Then afterwards I saw her and she continued with some symptoms of sob. I referred to cardiologist and pulmonologist. Echo was done and ok. D-dimer and vq scan looked negative.   Pt now has a lot more energy, no light headed, no syncope, and not having any significant sob/dypsnea episodes. She states breathing normal now.  Pt states specialist that her side effects occurred due to high dose of levofloxin. Pt was given 750 mg 5 days dose.      Review of Systems  Constitutional: Negative for chills, fatigue and fever.  HENT: Negative for congestion, facial swelling, nosebleeds, postnasal drip, rhinorrhea and sinus pressure.   Respiratory: Negative for cough, chest tightness, shortness of breath and wheezing.   Cardiovascular: Negative for chest pain and palpitations.  Gastrointestinal: Negative for abdominal distention, abdominal pain, constipation, diarrhea, nausea, rectal pain and vomiting.  Genitourinary: Negative for difficulty urinating, dysuria, flank pain, frequency, pelvic pain and urgency.  Musculoskeletal: Negative for back pain.  Neurological: Negative for dizziness, seizures, weakness and headaches.  Hematological: Negative for adenopathy. Does not bruise/bleed easily.  Psychiatric/Behavioral: Negative for agitation, confusion and decreased concentration.    Past Medical History:  Diagnosis Date  . Allergy   . Anxiety    Dr Madaline Guthrie  . Asthma   . Atypical seizure (HCC) 06/07/2012  . Depression    Dr Madaline Guthrie  . Diabetes mellitus   . DVT (deep venous thrombosis) (HCC)   . Headache(784.0)   . Hyperlipidemia   . Hypersomnia with sleep apnea,  unspecified 05/22/2014  . Olfactory aura   . OSA on CPAP   . Shingles 07/2013     Social History   Social History  . Marital status: Married    Spouse name: Lars Mage  . Number of children: 2  . Years of education: College   Occupational History  . Not on file.   Social History Main Topics  . Smoking status: Never Smoker  . Smokeless tobacco: Never Used  . Alcohol use 0.0 oz/week     Comment: occ  . Drug use: No  . Sexual activity: Not on file   Other Topics Concern  . Not on file   Social History Narrative   Patient is married Lars Mage) and lives at home with her husband.   Patient has two adult children.   Patient is retired.   Patient has a college education.   Patient is right-handed.   Patient drinks one cup of tea daily.    Past Surgical History:  Procedure Laterality Date  . APPENDECTOMY    . CHOLECYSTECTOMY    . COLONOSCOPY  2006   [facility]  . rotator cuff surgery    . TONSILLECTOMY       Family History  Problem Relation Age of Onset  . Stomach cancer Father   . Coronary artery disease Father   . Breast cancer Sister     breast cancer  . Diabetes Maternal Aunt   . Diabetes Paternal Aunt   . Prostate cancer Brother   . Stroke Neg Hx     Allergies  Allergen Reactions  .  Mushroom Ext Cmplx(Shiitake-Reishi-Mait) Anaphylaxis  . Shellfish Allergy Anaphylaxis  . Sulfonamide Derivatives Anaphylaxis    unknown  . Iodinated Diagnostic Agents     Other reaction(s): Other (See Comments) Bad head ahce  . Acetaminophen     REACTION: STOMACH DISCOMFORT  . Butalbital-Aspirin-Caffeine     unknown  . Clarithromycin Nausea And Vomiting  . Fluticasone-Salmeterol     Feels like something in the throat  . Levofloxacin Other (See Comments)  . Montelukast Sodium     unknown  . Moxifloxacin     unknown  . Orphenadrine Nausea And Vomiting  . Orphenadrine Citrate     unknown  . Penicillins     unknown    Current Outpatient Prescriptions on File Prior to  Visit  Medication Sig Dispense Refill  . Acetaminophen (TYLENOL EXTRA STRENGTH PO) Take 1 tablet by mouth as needed.    Marland Kitchen albuterol (PROVENTIL HFA;VENTOLIN HFA) 108 (90 Base) MCG/ACT inhaler Inhale 2 puffs into the lungs every 6 (six) hours as needed for wheezing or shortness of breath. VENTOLIN HFA 1 Inhaler 2  . metFORMIN (GLUCOPHAGE-XR) 750 MG 24 hr tablet 1 tablet daily. Taken if CBG is >120 two days in a row     No current facility-administered medications on file prior to visit.     BP 128/78 (BP Location: Left Arm, Patient Position: Sitting, Cuff Size: Large)   Pulse 79   Temp 98.5 F (36.9 C) (Oral)   Ht 4\' 11"  (1.499 m)   Wt 263 lb 4 oz (119.4 kg)   BMI 53.17 kg/m        Objective:   Physical Exam  General Mental Status- Alert. General Appearance- Not in acute distress.   Skin General: Color- Normal Color. Moisture- Normal Moisture.  Neck Carotid Arteries- Normal color. Moisture- Normal Moisture. No carotid bruits. No JVD.  Chest and Lung Exam Auscultation: Breath Sounds:-Normal.  Cardiovascular Auscultation:Rythm- Regular. Murmurs & Other Heart Sounds:Auscultation of the heart reveals- No Murmurs.  Abdomen Inspection:-Inspeection Normal. Palpation/Percussion:Note:No mass. Palpation and Percussion of the abdomen reveal- Non Tender, Non Distended + BS, no rebound or guarding.    Neurologic Cranial Nerve exam:- CN III-XII intact(No nystagmus), symmetric smile. Strength:- 5/5 equal and symmetric strength both upper and lower extremities.  Lower ext- no pedal edema, negative homans signs.      Assessment & Plan:  Your symptoms are now much imroved/resolved. Since extensive work up was negative I would agree with specialist that you likely had reaction to high dose levofloxin. So would avoid that in future.  Continue your current meds. Levofloxin now on allergy list.  Follow up as regularly scheduled with pcp or as needed  Chiann Goffredo, Ramon Dredge, [employer]

## 2016-12-30 NOTE — Patient Instructions (Addendum)
Your symptoms are now much imroved/resolved. Since extensive work up was negative I would agree with specialist that you likely had reaction to high dose levofloxin. So would avoid that in future.  Continue your current meds. Levofloxin now on allergy list.  Follow up as regularly scheduled with pcp or as needed

## 2017-01-03 ENCOUNTER — Ambulatory Visit (INDEPENDENT_AMBULATORY_CARE_PROVIDER_SITE_OTHER): Payer: Medicare Other | Admitting: *Deleted

## 2017-01-03 ENCOUNTER — Encounter: Payer: Self-pay | Admitting: *Deleted

## 2017-01-03 VITALS — BP 118/70 | HR 75 | Resp 16 | Ht 59.0 in | Wt 264.2 lb

## 2017-01-03 DIAGNOSIS — Z0000 Encounter for general adult medical examination without abnormal findings: Secondary | ICD-10-CM | POA: Diagnosis not present

## 2017-01-03 NOTE — Progress Notes (Addendum)
Subjective:   Nicole Mccann is a 71 y.o. female who presents for Medicare Annual (Subsequent) preventive examination.  Review of Systems:  No ROS.  Medicare Wellness Visit.  Cardiac Risk Factors include: dyslipidemia;advanced age (>36men, >36 women);diabetes mellitus;obesity (BMI >30kg/m2);sedentary lifestyle   Sleep patterns: Pt sleeps 5 hours/night then wakes up to use restroom then sleeps another 2 hours.  Home Safety/Smoke Alarms: 3 story home. Uses walker and cane. Smoke alarms in the house. Living environment; residence and Solicitor:. Lives with husband. Firearms stored safely.  Seat Belt Safety/Bike Helmet: Wears seat belt.   Counseling:   Eye Exam- Yearly eye exams. Wears reading glasses. Dental- Pt has upper dentures. Is following up with dentist for infection. Dr Lilian Kapur.   Female:   Pap-  Aged out.     Mammo-    06/15/14  Normal. Pt states she had one last year in 2017. [address]. Pt states it was normal. Dexa scan-   09/20/2016 Osteopenic.  CCS-Pt states she did not do the Cologuard as suggested and does not want referral for colonoscopy.      Objective:     Vitals: BP 118/70 (BP Location: Left Arm, Patient Position: Sitting, Cuff Size: Large)   Pulse 75   Resp 16   Ht 4\' 11"  (1.499 m)   Wt 264 lb 3.2 oz (119.8 kg)   SpO2 98%   BMI 53.36 kg/m   Body mass index is 53.36 kg/m.   Tobacco History  Smoking Status  . Never Smoker  Smokeless Tobacco  . Never Used     Counseling given: Not Answered   Past Medical History:  Diagnosis Date  . Allergy   . Anxiety    Dr Madaline Guthrie  . Asthma   . Atypical seizure (HCC) 06/07/2012  . Depression    Dr Madaline Guthrie  . Diabetes mellitus   . DVT (deep venous thrombosis) (HCC)   . Headache(784.0)   . Hyperlipidemia   . Hypersomnia with sleep apnea, unspecified 05/22/2014  . Olfactory aura   . OSA on CPAP   . Shingles 07/2013   Past Surgical History:  Procedure Laterality Date  . APPENDECTOMY    .  CHOLECYSTECTOMY    . COLONOSCOPY  2006   [facility]  . rotator cuff surgery    . TONSILLECTOMY      Family History  Problem Relation Age of Onset  . Stomach cancer Father   . Coronary artery disease Father   . Breast cancer Sister     breast cancer  . Diabetes Maternal Aunt   . Diabetes Paternal Aunt   . Prostate cancer Brother   . Cancer Daughter   . Stroke Neg Hx    History  Sexual Activity  . Sexual activity: No    Outpatient Encounter Prescriptions as of 01/03/2017  Medication Sig  . Acetaminophen (TYLENOL EXTRA STRENGTH PO) Take 1 tablet by mouth as needed.  Marland Kitchen albuterol (PROVENTIL HFA;VENTOLIN HFA) 108 (90 Base) MCG/ACT inhaler Inhale 2 puffs into the lungs every 6 (six) hours as needed for wheezing or shortness of breath. VENTOLIN HFA  . metFORMIN (GLUCOPHAGE-XR) 750 MG 24 hr tablet 1 tablet daily. Taken if CBG is >120 two days in a row   No facility-administered encounter medications on file as of 01/03/2017.     Activities of Daily Living In your present state of health, do you have any difficulty performing the following activities: 01/03/2017  Hearing? N  Vision? N  Difficulty concentrating or making  decisions? N  Walking or climbing stairs? N  Dressing or bathing? Y  Doing errands, shopping? N  Preparing Food and eating ? N  Using the Toilet? N  In the past six months, have you accidently leaked urine? Y  Do you have problems with loss of bowel control? N  Managing your Medications? N  Managing your Finances? N  Housekeeping or managing your Housekeeping? N  Some recent data might be hidden    Patient Care Team: Pearline Cables, MD as PCP - General (Family Medicine)    Assessment:    Physical assessment deferred to PCP.  Exercise Activities and Dietary recommendations Current Exercise Habits: The patient does not participate in regular exercise at present, Exercise limited by: orthopedic condition(s) (back pain)  Diet (meal preparation, eat out,  water intake, caffeinated beverages, dairy products, fruits and vegetables): Drinks a tervis of green tea and 2-3 glasses of water. 1/2 glass wine/night. Breakfast: Fruit, yogurt and cereal. Lunch: Sometimes skipped when she has a late breakfast. Dinner:   Pasta and vegetables and meat.     Goals    . Be Well    . Increase Ambulation      Fall Risk Fall Risk  01/03/2017 11/26/2016 05/05/2016 10/22/2015 10/17/2014  Falls in the past year? Yes No No Yes Yes  Number falls in past yr: 2 or more - - 1 -  Injury with Fall? No - - No -  Risk for fall due to : Impaired balance/gait Other (Comment) - - History of fall(s);Impaired balance/gait;Impaired mobility;Impaired vision  Follow up Education provided;Falls prevention discussed - - - -  Some recent data might be hidden   Depression Screen PHQ 2/9 Scores 01/03/2017 11/26/2016 05/05/2016 10/22/2015  PHQ - 2 Score 0 0 0 1  PHQ- 9 Score - - - -  Exception Documentation - Other- indicate reason in comment box - -  Some recent data might be hidden     Cognitive Function MMSE - Mini Mental State Exam 01/03/2017  Orientation to time 5  Orientation to Place 5  Registration 3  Attention/ Calculation 5  Recall 3  Language- name 2 objects 2  Language- repeat 1  Language- follow 3 step command 3  Language- read & follow direction 1  Write a sentence 1  Copy design 1  Total score 30  Some recent data might be hidden   Montreal Cognitive Assessment  06/03/2016  Visuospatial/ Executive (0/5) 5  Naming (0/3) 3  Attention: Read list of digits (0/2) 2  Attention: Read list of letters (0/1) 1  Attention: Serial 7 subtraction starting at 100 (0/3) 3  Language: Repeat phrase (0/2) 1  Language : Fluency (0/1) 0  Abstraction (0/2) 2  Delayed Recall (0/5) 4  Orientation (0/6) 5  Total 26  Adjusted Score (based on education) 26  Some recent data might be hidden      Immunization History  Administered Date(s) Administered  . Influenza Split  07/25/2012  . Influenza Whole 09/27/2007, 08/21/2008, 09/17/2009  . Influenza,inj,Quad PF,36+ Mos 07/23/2015, 08/08/2016  . Pneumococcal Conjugate-13 03/13/2015  . Td 11/08/2001  . Tdap 08/17/2013   Screening Tests Health Maintenance  Topic Date Due  . COLONOSCOPY  12/23/1993  . OPHTHALMOLOGY EXAM  06/26/2015  . PNA vac Low Risk Adult (2 of 2 - PPSV23) 03/12/2016  . MAMMOGRAM  06/25/2016  . HEMOGLOBIN A1C  07/23/2016  . FOOT EXAM  05/05/2017  . TETANUS/TDAP  08/18/2023  . INFLUENZA VACCINE  Addressed  .  DEXA SCAN  Completed  . Hepatitis C Screening  Completed      Plan:    Continue doing brain stimulating activities (puzzles, reading, adult coloring books, staying active) to keep memory sharp.   Bring a copy of your advance directives to your next office visit.  During the course of the visit the patient was educated and counseled about the following appropriate screening and preventive services:   Vaccines to include Pneumoccal, Influenza, Hepatitis B, Td, Zostavax, HCV  Cardiovascular Disease  Colorectal cancer screening  Bone density screening  Diabetes screening  Glaucoma screening  Mammography/PAP  Nutrition counseling   Patient Instructions (the written plan) was given to the patient.   Richelle Ito, RN  01/03/2017   I have read the above note by Ms. Albright and agree with her assessment and plan

## 2017-01-03 NOTE — Progress Notes (Signed)
Pre visit review using our clinic review tool, if applicable. No additional management support is needed unless otherwise documented below in the visit note. 

## 2017-01-03 NOTE — Patient Instructions (Addendum)
Continue doing brain stimulating activities (puzzles, reading, adult coloring books, staying active) to keep memory sharp.   Bring a copy of your advance directives to your next office visit.   Hacer ejercicio para mantenerse sano (Exercising to [employer]) Hacer actividad fsica con regularidad es muy importante. Tiene muchos otros beneficios, como por ejemplo:  Mejorar el estado fsico, la flexibilidad y la resistencia.  Aumenta la densidad sea.  Ayuda a Art gallery manager.  Disminuye la Art gallery manager.  Aumenta la fuerza muscular.  Reduce el estrs y las tensiones.  Mejora el estado de salud general. Para estar sano y Blaine as, se recomienda que haga ejercicio de intensidad moderada y de intensidad vigorosa. Puede saber que est haciendo ejercicio de intensidad moderada si tiene una frecuencia cardaca ms elevada y [place] respiracin ms rpida, pero an Therapist, occupational. Puede saber que est haciendo ejercicio de intensidad vigorosa si respira con mucha ms dificultad y rapidez, y no puede Pharmacologist una conversacin. CON QU FRECUENCIA DEBO HACER EJERCICIO? Elija una actividad que disfrute y establezca objetivos realistas. El mdico puede ayudarlo a Event organiser un plan de actividades que funcione para usted. Haga ejercicio regularmente como se lo haya indicado el mdico. Esta puede incluir:  Programme researcher, broadcasting/film/video de resistencia dos veces por semana, como:  Flexiones de [place].  Abdominales.  Levantamiento de pesas.  Ejercicios con bandas elsticas.  Realizar una intensidad determinada de ejercicio durante una cantidad determinada de [place]. Elija entre estas opciones:  de ejercicio de intensidad moderada cada semana.  de ejercicio de intensidad vigorosa cada semana.  Burlene Arnt de ejercicio de intensidad moderada y vigorosa cada semana. Los nios, las mujeres Lucan, las personas que no estn en forma, las personas con  sobrepeso y los adultos mayores tal vez tengan que consultar a un mdico para que les d Medical laboratory scientific officer. Si tiene [employer], asegrese de Science writer al mdico antes de comenzar un programa de ejercicios nuevo. CULES SON ALGUNAS IDEAS DE EJERCICIO? Algunas ideas de ejercicio de intensidad moderada incluyen:  Caminar a un ritmo de 1 milla (1,6 kilmetros) en 15 minutos.  Andar en bicicleta.  Hacer senderismo.  Jugar al golf.  Bailar. Algunas ideas de ejercicio de intensidad vigorosa incluyen:  Caminar a un ritmo de al menos 4,5 millas (7 kilmetros) por hora.  Trotar o correr a un ritmo de 5 millas (8 kilmetros) por hora.  Andar en bicicleta a un ritmo de al menos 10 millas (16 kilmetros) por hora.  Practicar natacin.  Practicar patinaje sobre ruedas normales o en lnea.  Hacer esqu de fondo.  Hacer deportes competitivos vigorosos, como ftbol americano, bsquet y ftbol.  Saltar la soga.  Tomar clases de baile aerbico. CULES SON ALGUNAS ACTIVIDADES DIARIAS QUE PUEDEN AYUDARME A HACER EJERCICIO?  Trabajo en el jardn, como:  Empujar una cortadora de csped.  Juntar y embolsar hojas.  Lavar y Arts development officer el automvil.  Empujar un cochecito.  Palear nieve.  Cuidar el jardn.  Lavar las ventanas o los pisos. CMO PUEDO SER MS ACTIVO EN MIS ACTIVIDADES DIARIAS?  Utilice las Microbiologist del ascensor.  D una caminata durante su hora de almuerzo.  Si conduce, estacione el automvil ms lejos del trabajo o de la escuela.  Si [place] transporte pblico, bjese una parada antes y camine el resto del camino.  Pngase de pie y camine cada vez que haga llamadas telefnicas.  Levntese, estrese y camine cada a lo largo del Futures trader. QU PAUTAS DEBO SEGUIR  MIENTRAS HAGO EJERCICIO?  No haga ejercicio en exceso que pudiera hacer que se lastime, se sienta mareado o tenga dificultad para respirar.  Consulte al mdico antes de  comenzar un programa de ejercicios nuevo.  Use ropa cmoda y calzado con buen soporte.  Beba gran cantidad de agua mientras hace ejercicios para evitar la deshidratacin o los golpes de Airline pilot. Durante la actividad fsica se pierde agua corporal que se debe reponer.  Haga ejercicio hasta que se acelere su respiracin y sus latidos cardacos. Esta informacin no tiene Theme park manager el consejo del mdico. Asegrese de hacerle al mdico cualquier pregunta que tenga. Document Released: 01/29/2011 Document Revised: 11/15/2014 Document Reviewed: 03/28/2014 Elsevier Interactive Patient Education  2017 [employer].

## 2017-01-04 ENCOUNTER — Telehealth: Payer: Self-pay | Admitting: *Deleted

## 2017-01-04 NOTE — Telephone Encounter (Signed)
Pt did not schedule follow up appt with PCP as directed during Wellness visit yesterday. I called her today to schedule appt and she stated that she did not wish to follow up at this time. Per her husband, she has had several dr appt lately due to tooth infection. Advised her to schedule routine follow up at her convenience.

## 2017-01-20 ENCOUNTER — Telehealth: Payer: Self-pay | Admitting: Family Medicine

## 2017-01-20 NOTE — Telephone Encounter (Signed)
Caller name: August  Relation to pt: Dr. Doylene Canning (dentist)  Call back number: [phone]    Reason for call:  Patient receiving dental implants and would like to know what antibiotics patient can take,please advise

## 2017-01-20 NOTE — Telephone Encounter (Signed)
Tried to return call to pt's dentist office regarding which antibiotic pt can use. Dentist office closed at 2pm today, left detailed voicemail.  Per pt's chart the pt can take Clindamycin, pt has taken in the past....... Pt does have an allergy to CLARITHROMYCIN.

## 2017-02-02 ENCOUNTER — Ambulatory Visit
Admission: RE | Admit: 2017-02-02 | Discharge: 2017-02-02 | Disposition: A | Discharge status: Home / Self Care | Payer: Medicare Other | Source: Ambulatory Visit | Attending: Family Medicine | Admitting: Family Medicine

## 2017-02-02 DIAGNOSIS — M545 Low back pain, unspecified: Secondary | ICD-10-CM

## 2017-02-02 DIAGNOSIS — G8929 Other chronic pain: Secondary | ICD-10-CM

## 2017-02-02 MED ORDER — IOPAMIDOL (ISOVUE-M 200) INJECTION 41%
1.0000 mL | Freq: Once | INTRAMUSCULAR | Status: AC
  Administered 2017-02-02: 1 mL via INTRA_ARTICULAR

## 2017-02-02 MED ORDER — METHYLPREDNISOLONE ACETATE 40 MG/ML INJ SUSP (RADIOLOG
120.0000 mg | Freq: Once | INTRAMUSCULAR | Status: AC
  Administered 2017-02-02: 120 mg via INTRA_ARTICULAR

## 2017-02-02 NOTE — Discharge Instructions (Signed)

## 2017-02-09 LAB — LIPID PANEL
Cholesterol: 245 — AB (ref 0–200)
HDL: 59 (ref 35–70)
LDL Cholesterol: 154
Triglycerides: 162 — AB (ref 40–160)

## 2017-02-09 LAB — HEMOGLOBIN A1C: Hemoglobin A1C: 6.2

## 2017-02-16 ENCOUNTER — Ambulatory Visit: Payer: Medicare Other | Admitting: Family Medicine

## 2017-04-11 ENCOUNTER — Other Ambulatory Visit: Payer: Self-pay | Admitting: Family Medicine

## 2017-04-11 DIAGNOSIS — Z1231 Encounter for screening mammogram for malignant neoplasm of breast: Secondary | ICD-10-CM

## 2017-04-27 ENCOUNTER — Encounter: Payer: Self-pay | Admitting: Family Medicine

## 2017-04-27 ENCOUNTER — Ambulatory Visit (INDEPENDENT_AMBULATORY_CARE_PROVIDER_SITE_OTHER): Payer: Medicare Other | Admitting: Family Medicine

## 2017-04-27 DIAGNOSIS — M545 Low back pain: Secondary | ICD-10-CM

## 2017-04-27 DIAGNOSIS — G8929 Other chronic pain: Secondary | ICD-10-CM

## 2017-04-27 MED ORDER — NAPROXEN 500 MG PO TABS: 500.0000 mg | ORAL_TABLET | Freq: Two times a day (BID) | ORAL | 2 refills | Status: DC | PRN

## 2017-04-27 MED FILL — NAPROXEN 500 MG TABLET: 500 | 30 days supply | Qty: 60 | Fill #0

## 2017-04-27 NOTE — Patient Instructions (Signed)
Start naproxen 500mg  twice a day with food for pain and inflammation. Ok to take your supplements. Ok to take tramadol as needed for severe pain. Consider epidural steroid injection of your back, chiropractic care, neurosurgery referral, prednisone dose pack. Let me know how you're doing after 1-2 weeks.

## 2017-05-02 NOTE — Assessment & Plan Note (Signed)
pain due to spinal stenosis.  Unfortunately last injections only helped a few days.  We went through her options - she declined ESI, neurosurgery referral for now.  She would like to try prescription strength naproxen along with her supplements and tramadol first and let me know over the next 1-2 weeks.  She will consider repeating prednisone, chiropractic care as well.  We still have not received any records from Dr. Metta Clines.

## 2017-05-02 NOTE — Progress Notes (Signed)
PCP: Copland, Gwenlyn Found, MD  Subjective:   HPI: Patient is a 71 y.o. female here for low back pain.  7/18: Patient reports she's had worsening low back pain for past 2 months. Describes pain as a pressure, worse when lying in bed and trying to get up. Tried pain patch, icing, tylenol, ibuprofen. Reports the ibuprofen upset her stomach. Some tingling into left toes at times (1st-3rd). No bowel/bladder dysfunction. 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 She notes icing and an inflatable belt help with her pain.  8/22: Patient returns with continued severe pain in low back. Pain level 10/10, sharp. Worse with standing straight, extension. Radiates into left leg. Pain worse on left side of low back moreso than the right. Was using a cane, now using a walker. Improves with bending forward. Difficulty getting out of bed, changing positions. No bowel/bladder dysfunction.doing home exercises, using heating pad, an air belt. Doing water exercises at [facility] No skin changes, numbness currently.  07/13/16: Patient reports she has improved a lot since last visit, prednisone. Pain level 3/10 midline of low back. Not as bad into the left leg. Worse with walking upright. No bowel/bladder dysfunction. No numbness or tingling.  11/11/16: Patient reports pain returned over past month in low back. Pain level 9/10, sharp. Feels pain in bilateral low back into both legs. Taking tylenol, using salon pas patches. Uses rolling walker. Difficulty changing positions. Pain is like a needle sensation. No bowel/bladder dysfunction. No numbness or tingling.  1/19: Patient reports her pain has improved since last visit. Pain level now 6.5-7 and only on left side. Worse with extension. Does radiate into her leg. Taking tylenol. No skin changes. No bowel/bladder  dysfunction. No numbness or tingling.  6/20: Patient returns with severe low back pain. Worse on left side and radiating down to knee. Pain level 10/10 and sharp. Worse with walking, sitting, stairs. Most recent injection only helped 2-4 days. Some relief with belt - is using TENS unit also. Pain is worst when lying flat though. Tried hemp oil, curcumin, boswellia, brownies. No bowel/bladder dysfunction. No numbness or tingling.  Past Medical History:  Diagnosis Date  . Allergy   . Anxiety    Dr Madaline Guthrie  . Asthma   . Atypical seizure (HCC) 06/07/2012  . Depression    Dr Madaline Guthrie  . Diabetes mellitus   . DVT (deep venous thrombosis) (HCC)   . Headache(784.0)   . Hyperlipidemia   . Hypersomnia with sleep apnea, unspecified 05/22/2014  . Olfactory aura   . OSA on CPAP   . Shingles 07/2013    Current Outpatient Prescriptions on File Prior to Visit  Medication Sig Dispense Refill  . Acetaminophen (TYLENOL EXTRA STRENGTH PO) Take 1 tablet by mouth as needed.    Marland Kitchen albuterol (PROVENTIL HFA;VENTOLIN HFA) 108 (90 Base) MCG/ACT inhaler Inhale 2 puffs into the lungs every 6 (six) hours as needed for wheezing or shortness of breath. VENTOLIN HFA 1 Inhaler 2  . metFORMIN (GLUCOPHAGE-XR) 750 MG 24 hr tablet 1 tablet daily. Taken if CBG is >120 two days in a row     No current facility-administered medications on file prior to visit.     Past Surgical History:  Procedure Laterality Date  . APPENDECTOMY    . CHOLECYSTECTOMY    . COLONOSCOPY  2006   [facility]  . rotator cuff surgery    .  TONSILLECTOMY       Allergies  Allergen Reactions  . Mushroom Ext Cmplx(Shiitake-Reishi-Mait) Anaphylaxis  . Shellfish Allergy Anaphylaxis  . Sulfonamide Derivatives Anaphylaxis  . Fluticasone-Salmeterol Other (See Comments)    Feels like something in the throat  . Butalbital-Aspirin-Caffeine     unknown  . Levofloxacin Other (See Comments)  . Montelukast Sodium     unknown  . Moxifloxacin      unknown  . Orphenadrine Citrate     unknown  . Penicillins     unknown  . Acetaminophen Other (See Comments)    Stomach discomfort  . Clarithromycin Nausea And Vomiting  . Iodinated Diagnostic Agents Other (See Comments)    Bad headache; no prep needed  . Orphenadrine Nausea And Vomiting    Social History   Social History  . Marital status: Married    Spouse name: Lars Mage  . Number of children: 2  . Years of education: College   Occupational History  . Not on file.   Social History Main Topics  . Smoking status: Never Smoker  . Smokeless tobacco: Never Used  . Alcohol use 1.8 oz/week    3 Glasses of wine per week     Comment: 1/2 glass wine daily  . Drug use: No  . Sexual activity: No   Other Topics Concern  . Not on file   Social History Narrative   Patient is married Lars Mage) and lives at home with her husband.   Patient has two adult children.   Patient is retired.   Patient has a college education.   Patient is right-handed.   Patient drinks one cup of tea daily.    Family History  Problem Relation Age of Onset  . Stomach cancer Father   . Coronary artery disease Father   . Breast cancer Sister        breast cancer  . Diabetes Maternal Aunt   . Diabetes Paternal Aunt   . Prostate cancer Brother   . Cancer Daughter   . Stroke Neg Hx     BP 119/68   Pulse 73   Ht 5' (1.524 m)   Wt 260 lb (117.9 kg)   BMI 50.78 kg/m   Review of Systems: See HPI above.    Objective:  Physical Exam:  Gen: NAD, comfortable in exam room  Back: No gross deformity, scoliosis. TTP left SI joint, paraspinal lumbar region.  No midline or bony TTP. Limited flexion and extension due to pain. Strength LEs 5/5 all muscle groups.   Positive left SLR, negative right.   Sensation intact to light touch bilaterally. Negative logroll bilateral hips    Assessment & Plan:  1. Low back pain - pain due to spinal stenosis.  Unfortunately last injections only helped a few  days.  We went through her options - she declined ESI, neurosurgery referral for now.  She would like to try prescription strength naproxen along with her supplements and tramadol first and let me know over the next 1-2 weeks.  She will consider repeating prednisone, chiropractic care as well.  We still have not received any records from Dr. Metta Clines.

## 2017-05-04 ENCOUNTER — Ambulatory Visit
Admission: RE | Admit: 2017-05-04 | Discharge: 2017-05-04 | Disposition: A | Discharge status: Home / Self Care | Payer: Medicare Other | Source: Ambulatory Visit | Attending: Family Medicine | Admitting: Family Medicine

## 2017-05-04 DIAGNOSIS — Z1231 Encounter for screening mammogram for malignant neoplasm of breast: Secondary | ICD-10-CM

## 2017-06-07 ENCOUNTER — Encounter: Payer: Self-pay | Admitting: Neurology

## 2017-06-07 ENCOUNTER — Ambulatory Visit (INDEPENDENT_AMBULATORY_CARE_PROVIDER_SITE_OTHER): Payer: Medicare Other | Admitting: Neurology

## 2017-06-07 VITALS — BP 128/80 | HR 72 | Ht 60.0 in | Wt 267.0 lb

## 2017-06-07 DIAGNOSIS — E0842 Diabetes mellitus due to underlying condition with diabetic polyneuropathy: Secondary | ICD-10-CM

## 2017-06-07 DIAGNOSIS — R52 Pain, unspecified: Secondary | ICD-10-CM

## 2017-06-07 NOTE — Progress Notes (Signed)
HPI: Nicole Mccann, 71 -year-old pleasant [place] lady returns for followup.   She had more falls, but none associated with a seizure or olfactory aura.   Continues medication -  She was so dizzy she felt she fall , but she has less headaches and is less fatigued and sleepy on CPAP. She is highly compliant.  The patient is a 97% compliance on her last download 29 out of 30 days was 5 hours and 45 minutes of average use per night she is on an auto set 4-12 cm water pressure with 3 cm EPR her residual AHI is 1.7 which is an excellent result Epworth remains at 10 points fatigue at only 20 points.  06-28-12  Patient was seen in the EMU at [facility] and diagnosed with an olfactory aura, no EEG abnormalities. 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  The Keppra was well tolerated. She has only once a week any aura, not daily.  November 21, 2012. Patient developed depression, possibly worseing on Keppra which we weaned off. She is no longer taking Coumadin for the presumed upper extremity DVT (Dr. Azucena Kuba, [facility])  perhaps rather a thrombophlebitis. She was changed to Lamictal and was doing well on 100 mg XR daily in the AM  after a slow titration.  She will progress to 100 mg now.   05/21/13-CM  followup visit today and patient has had 2 auras  since last seen. Her Lamictal dose is currently at 150 daily instead of 200 as Dr. Vickey Huger had ordered.  Apparently she did not understand that she was supposed to continue to titrate to 200. She denies any side effects to the medication. He has no new neurologic complaints. She is currently receiving lumbar epidural steroids by Dr. Arta Bruce for her low back pain.  05-22-14 - Psychiatrist recommend Wellbutrin, but changed to Zoloft after seizure concerns were discussed.  She has better mood on Lamictal,  He was sad " blue " on Keppra.  She was hospitalized in  [place] , August 28 2013, after a seizure or syncope on a cruise. Her husband fainted  and needed hospital admission , too.  Both Nicole Mccann spent their [employer] in a hospital in [place]! Her husband is concerned about her loud snoring, she is obese and has been very fatigued. She denies waking up with headaches. Both have witnessed each others apneas.    Interval history from 06/07/2017. I have the pleasure of seeing Nicole Mccann- she has been and 97% compliance CPAP user with 6 hours and 36 minutes of average user time, using an auto set CPAP between 4 and 12 cm water with 3 cm EPR and her residual AHI of 1.8.  There is no evidence of central apneas emerging. She does have some air leaks, but they have not affected her apnea count negatively. If she is comfortable with the current interface I would not necessarily change it. In addition she has advanced to a walker from a cane. Her back pain has improved the patient had a injection into the lower back by Dr. Modesto Charon, sacroiliac. Dr Metta Clines is her pain doctor.   Psychiatrically improved, Dr Madaline Guthrie,  less depressed, but she discontinued her prescription meds- all of them after she  was very sick following a dental extraction with abcess, she reported, from January- March 2018, as a side effect of antibiotics(.levofloxasin).   She appreciated a referral to  [facility] , Dr. Dalbert Garnet.       Montreal Cognitive Assessment  06/03/2016  Visuospatial/ Executive (0/5) 5  Naming (0/3) 3  Attention: Read list of digits (0/2) 2  Attention: Read list of letters (0/1) 1  Attention: Serial 7 subtraction starting at 100 (0/3) 3  Language: Repeat phrase (0/2) 1  Language : Fluency (0/1) 0  Abstraction (0/2) 2  Delayed Recall (0/5) 4  Orientation (0/6) 5  Total 26  Adjusted Score (based on education) 26     ROS:  Weight gain, easy bruising, allergies, snoring  Physical Exam General: well developed, well nourished, seated, in no evident  distress Head: head normocephalic and atraumatic. Oropharynx benign Neck: supple with no carotid  bruits Cardiovascular: regular rate and rhythm, no murmurs  Neurologic Exam Mental Status: Awake and fully alert. Oriented to place and time. Follows all commands. Speech and language normal.   Cranial Nerves: Fundoscopic exam without evidence of papilledema. Pupils equal, briskly reactive to light.  Extraocular movements full with saccades, not  nystagmus. Visual fields - she reports trouble seeing the floor.  Hearing intact and symmetric to finger snap. Facial sensation intact. Face, tongue, palate move normally and symmetrically.  Neck flexion and extension normal.  Motor: Normal bulk and tone. Normal strength in all tested extremity muscles.No focal weakness, but reports foot drop when her sugar is low !  Sensory : loss of vibration in toes. Tingling and pin and needle dyeasthesias.  Coordination: Rapid alternating movements normal in all extremities.  Finger-to-nose  performed accurately bilaterally. Gait and Station: Arises from chair without difficulty. Stance is wide-based, ambulates with a single-point cane   Reflexes: 2+ and symmetric. Toes downgoing.     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 Headache free for 2 years .Patient is continuously using an auto CPAP between 4 and 12 cm water allowing for a residual AHI of only  1.8 , compliance 97%for 30 days with 6hours and 36 minutes of nightly use. The EPR level stays at  3 cm water.  3) obesity - unchanged.contributed to gait, pain, DDD, diabetic condition.  4) diabetes poorly controlled- and may cause vision loss, changes in acuity  , too. Benefit from low carb would be seen here ,too.  5) depression treated with Zoloft by  Dr. Madaline Guthrie . 6) gait disorder, uses a walker-  Back pain, in PT. She will continue with water exercise. She needs to see [facility]. Dr Dalbert Garnet.     PLAN:  20 minutes RV without new MOCA, there was no evidence of MCI or dementia in our last visit. .   Lamictal  discontinued- she has been doing well without medication for 2.5 month- If she has not longer olfactory auras, and they not recurring, she can stay off. She is allowed driving after another 12 month.   From now on yearly CPAP revisit , will  schedule with her husband same day .   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 She needs to loose weight.  I will refer to [facility]>   Followup yearly and when necessary.  Melvyn Novas, MD Patient ID: Nicole Mccann, female   DOB: 05/04/1946, 71 y.o.   MRN: [id number]

## 2017-11-09 ENCOUNTER — Encounter (HOSPITAL_BASED_OUTPATIENT_CLINIC_OR_DEPARTMENT_OTHER): Payer: Self-pay

## 2017-11-09 ENCOUNTER — Ambulatory Visit: Payer: Self-pay | Admitting: *Deleted

## 2017-11-09 ENCOUNTER — Emergency Department (HOSPITAL_BASED_OUTPATIENT_CLINIC_OR_DEPARTMENT_OTHER): Payer: Medicare Other

## 2017-11-09 ENCOUNTER — Other Ambulatory Visit: Payer: Self-pay

## 2017-11-09 ENCOUNTER — Emergency Department (HOSPITAL_BASED_OUTPATIENT_CLINIC_OR_DEPARTMENT_OTHER)
Admission: EM | Admit: 2017-11-09 | Discharge: 2017-11-09 | Disposition: A | Discharge status: Home / Self Care | Payer: Medicare Other | Attending: Emergency Medicine | Admitting: Emergency Medicine

## 2017-11-09 DIAGNOSIS — J45909 Unspecified asthma, uncomplicated: Secondary | ICD-10-CM | POA: Diagnosis not present

## 2017-11-09 DIAGNOSIS — Z7984 Long term (current) use of oral hypoglycemic drugs: Secondary | ICD-10-CM | POA: Insufficient documentation

## 2017-11-09 DIAGNOSIS — R609 Edema, unspecified: Secondary | ICD-10-CM | POA: Diagnosis not present

## 2017-11-09 DIAGNOSIS — E1143 Type 2 diabetes mellitus with diabetic autonomic (poly)neuropathy: Secondary | ICD-10-CM | POA: Insufficient documentation

## 2017-11-09 LAB — BASIC METABOLIC PANEL
Anion gap: 5 (ref 5–15)
BUN: 18 mg/dL (ref 6–20)
CO2: 28 mmol/L (ref 22–32)
Calcium: 8.8 mg/dL — ABNORMAL LOW (ref 8.9–10.3)
Chloride: 105 mmol/L (ref 101–111)
Creatinine, Ser: 0.79 mg/dL (ref 0.44–1.00)
GFR calc Af Amer: >60 mL/min (ref >60)
GFR calc non Af Amer: >60 mL/min (ref >60)
Glucose, Bld: 104 mg/dL — ABNORMAL HIGH (ref 65–99)
Potassium: 4.3 mmol/L (ref 3.5–5.1)
Sodium: 138 mmol/L (ref 135–145)

## 2017-11-09 LAB — CBC WITH DIFFERENTIAL/PLATELET
Basophils Absolute: 0 10*3/uL (ref 0.0–0.1)
Basophils Relative: 0 %
Eosinophils Absolute: 0.3 10*3/uL (ref 0.0–0.7)
Eosinophils Relative: 3 %
HCT: 43.1 % (ref 36.0–46.0)
Hemoglobin: 14.3 g/dL (ref 12.0–15.0)
Lymphocytes Relative: 31 %
Lymphs Abs: 2.5 10*3/uL (ref 0.7–4.0)
MCH: 30.6 pg (ref 26.0–34.0)
MCHC: 33.2 g/dL (ref 30.0–36.0)
MCV: 92.1 fL (ref 78.0–100.0)
Monocytes Absolute: 1 10*3/uL (ref 0.1–1.0)
Monocytes Relative: 12 %
Neutro Abs: 4.3 10*3/uL (ref 1.7–7.7)
Neutrophils Relative %: 54 %
Platelets: 221 10*3/uL (ref 150–400)
RBC: 4.68 MIL/uL (ref 3.87–5.11)
RDW: 13.1 % (ref 11.5–15.5)
WBC: 8.1 10*3/uL (ref 4.0–10.5)

## 2017-11-09 MED ORDER — FUROSEMIDE 20 MG PO TABS: 20.0000 mg | ORAL_TABLET | Freq: Every day | ORAL | 0 refills | Status: DC

## 2017-11-09 MED ORDER — POTASSIUM CHLORIDE CRYS ER 20 MEQ PO TBCR: 20.0000 meq | EXTENDED_RELEASE_TABLET | Freq: Every day | ORAL | 0 refills | Status: DC

## 2017-11-09 MED FILL — FUROSEMIDE 20 MG TAB: 20 | 4 days supply | Qty: 4 | Fill #0

## 2017-11-09 MED FILL — POTASSIUM CL ER 20 MEQ TABL: 20 | 4 days supply | Qty: 4 | Fill #0

## 2017-11-09 NOTE — ED Triage Notes (Signed)
C/o bilat leg swelling x 5 days-presents to triage in w/c-NAD

## 2017-11-09 NOTE — ED Notes (Signed)
Pt reports she had an episode of chest pain and SOB yesterday.

## 2017-11-09 NOTE — Discharge Instructions (Signed)
I think the swelling in your legs is from what we refer to as venous insufficiency. The swelling will be worse with standing. Try to keep your legs elevated like you have been when you are off of them.   Compression stockings can be difficult to put on and take off but they can be very helpful. They can be found at medical supply stores and some pharmacies.   You are being prescribed a medication called lasix(furosemide). It works through [employer] and is often referred to as a "water pill." You will urinate frequently while taking it but will help get rid of excessive fluid. You are being prescribe potassium to take with it as well.

## 2017-11-09 NOTE — Telephone Encounter (Signed)
Just an FYI. Pt headed to ED.  

## 2017-11-09 NOTE — ED Provider Notes (Signed)
I received this patient in signout from Dr. Juleen China. We were awaiting DVT US.  Ultrasound was negative for DVT.  Patient comfortable on reassessment.  Discussed supportive measures including compression stockings and elevation, PCP follow-up for ongoing management.  Return precautions given including chest pain, shortness of breath.  Patient voiced understanding and discharged in satisfactory condition.   Little, Ambrose Finland, MD 11/09/17 [phone]

## 2017-11-09 NOTE — ED Provider Notes (Addendum)
[facility] EMERGENCY DEPARTMENT Provider Note   CSN: [id number] Arrival date & time: 11/09/17  1205     History   Chief Complaint Chief Complaint  Patient presents with  . Leg Swelling    HPI Nicole Mccann is a 72 y.o. female.  HPI   [name] with LE pain/swelling. 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 She has been having waxing/waning bilateral LE swelling for the past several weeks which has progressively worsened. When she waxes up the swelling is not that bad. It consistently worsening through the day. It is improved with elevation. It has not really been painful until the past 2 days when she began having pain behind R knee and posterior thigh. A few weeks ago she went on a cruise and had to travel 300 miles to the port of departure. Denies past hx of DVT/PE.   Past Medical History:  Diagnosis Date  . Allergy   . Anxiety    Dr Madaline Guthrie  . Asthma   . Atypical seizure (HCC) 06/07/2012  . Depression    Dr Madaline Guthrie  . Diabetes mellitus   . DVT (deep venous thrombosis) (HCC)   . Headache(784.0)   . Hyperlipidemia   . Hypersomnia with sleep apnea, unspecified 05/22/2014  . Olfactory aura   . OSA on CPAP   . Shingles 07/2013    Patient Active Problem List   Diagnosis Date Noted  . Dizziness and giddiness 12/22/2016  . Osteopenia 09/21/2016  . Diabetic polyneuropathy associated with diabetes mellitus due to underlying condition (HCC) 06/03/2016  . Low back pain 05/26/2016  . Encounter for medication management 04/15/2015  . Right shoulder pain 04/04/2015  . Adjustment disorder with mixed anxiety and depressed mood 03/14/2015  . Rotator cuff (capsule) sprain 03/13/2015  . Pseudoptosis 11/18/2014  . Seizure disorder, temporal lobe, intractable (HCC) 10/17/2014  . 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 Severe obesity (BMI >= 40) (HCC) 10/17/2014  . OSA on CPAP  10/17/2014  . Hypersomnia with sleep apnea, unspecified 05/22/2014  . Injury of left knee 08/22/2013  . Variants of migraine, not elsewhere classified, without mention of intractable migraine without mention of status migrainosus 05/22/2013  . 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 Spinal stenosis of lumbar region 12/17/2012  . Pulmonary embolism (HCC) 07/09/2012  . Dyspnea and respiratory abnormality 07/07/2012  . DVT (deep venous thrombosis) (HCC) 07/07/2012  . Atypical seizure (HCC) 06/07/2012  . Phlebitis following infusion 06/05/2012  . BENIGN POSITIONAL VERTIGO 11/06/2009  . DIABETES MELLITUS, TYPE II 04/04/2009  . ALLERGIC RHINITIS 04/04/2009  . ASTHMA 04/04/2009  . DIVERTICULOSIS, COLON 04/04/2009  . HYPERLIPIDEMIA NEC/NOS 06/29/2007  . DISORDER, DYSMETABOLIC SYNDROME X 06/29/2007    Past Surgical History:  Procedure Laterality Date  . APPENDECTOMY    . BREAST EXCISIONAL BIOPSY Right 1998  . CHOLECYSTECTOMY    . COLONOSCOPY  2006   [facility]  . rotator cuff surgery    . TONSILLECTOMY       OB History    No data available       Home Medications    Prior to Admission medications   Medication Sig Start Date End Date Taking? Authorizing Provider  Acetaminophen (TYLENOL EXTRA STRENGTH PO) Take 1 tablet by mouth as needed.    [provider]  albuterol (PROVENTIL HFA;VENTOLIN HFA) 108 (90 Base) MCG/ACT inhaler Inhale 2 puffs into the lungs every 6 (six) hours  as needed for wheezing or shortness of breath. VENTOLIN HFA 12/20/16   Saguier, Ramon Dredge, PA-C  metFORMIN (GLUCOPHAGE-XR) 750 MG 24 hr tablet 1 tablet daily. Taken if CBG is >120 two days in a row 05/01/14   [provider]  naproxen (NAPROSYN) 500 MG tablet Take 1 tablet (500 mg total) by mouth 2 (two) times daily as needed. 04/27/17   Lenda Kelp, MD  prednisoLONE acetate (PRED FORTE) 1 % ophthalmic suspension  06/06/17   [provider]    Family History Family  History  Problem Relation Age of Onset  . Stomach cancer Father   . Coronary artery disease Father   . Breast cancer Sister 68       breast cancer  . Diabetes Maternal Aunt   . Diabetes Paternal Aunt   . Prostate cancer Brother   . Cancer Daughter   . Stroke Neg Hx     Social History Social History   Tobacco Use  . Smoking status: Never Smoker  . Smokeless tobacco: Never Used  Substance Use Topics  . Alcohol use: Yes    Alcohol/week: 1.8 oz    Types: 3 Glasses of wine per week    Comment: 1/2 glass wine daily  . Drug use: No     Allergies   Mushroom ext cmplx(shiitake-reishi-mait); Shellfish allergy; Sulfonamide derivatives; Fluticasone-salmeterol; Butalbital-aspirin-caffeine; Levofloxacin; Montelukast sodium; Moxifloxacin; Orphenadrine citrate; Penicillins; Acetaminophen; Clarithromycin; Iodinated diagnostic agents; and Orphenadrine   Review of Systems Review of Systems  All systems reviewed and negative, other than as noted in HPI.   Physical Exam Updated Vital Signs BP (!) 153/62 (BP Location: Right Arm)   Pulse 75   Temp 98.3 F (36.8 C) (Oral)   Resp 20   Ht 5' (1.524 m)   Wt 119.3 kg (263 lb)   SpO2 98%   BMI 51.36 kg/m   Physical Exam  Constitutional: She appears well-developed and well-nourished. No distress.  HENT:  Head: Normocephalic and atraumatic.  Eyes: Conjunctivae are normal. Right eye exhibits no discharge. Left eye exhibits no discharge.  Neck: Neck supple.  Cardiovascular: Normal rate, regular rhythm and normal heart sounds. Exam reveals no gallop and no friction rub.  No murmur heard. Pulmonary/Chest: Effort normal and breath sounds normal. No respiratory distress.  Abdominal: Soft. She exhibits no distension. There is no tenderness.  Musculoskeletal: She exhibits no edema or tenderness.  Symmetric lower extremity edema.  There is some erythema to her bilateral shins.  Suspect this this is stasis dermatitis more than anything.  She  does have some tenderness in the right popliteal fossa and distal/posterior right thigh.  No palpable cords.  Negative Homans.  Neurological: She is alert.  Skin: Skin is warm and dry.  Psychiatric: She has a normal mood and affect. Her behavior is normal. Thought content normal.  Nursing note and vitals reviewed.    ED Treatments / Results  Labs (all labs ordered are listed, but only abnormal results are displayed) Labs Reviewed  BASIC METABOLIC PANEL - Abnormal; Notable for the following components:      Result Value   Glucose, Bld 104 (*)    Calcium 8.8 (*)    All other components within normal limits  CBC WITH DIFFERENTIAL/PLATELET    EKG  EKG Interpretation  Date/Time:  Wednesday November 09 2017 14:07:20 EST Ventricular Rate:  64 PR Interval:    QRS Duration: 89 QT Interval:  422 QTC Calculation: 436 R Axis:   64 Text Interpretation:  Sinus rhythm  Borderline T abnormalities, anterior leads No significant change since last tracing Confirmed by Frederick Peers [phone]) on 11/09/2017 3:42:53 PM       Radiology No results found.  Procedures Procedures (including critical care time)  Medications Ordered in ED Medications - No data to display   Initial Impression / Assessment and Plan / ED Course  I have reviewed the triage vital signs and the nursing notes.  Pertinent labs & imaging results that were available during my care of the patient were reviewed by me and considered in my medical decision making (see chart for details).     72 year old female with intermittent swelling in bilateral lower extremities.  Also some redness to bilateral shins.  Exam seems most consistent with venous stasis.  She does report some pain behind her right knee and posterior thigh which I cannot clearly attribute to simply this though.  Will obtain ultrasound to evaluate for possible DVT.  I do not feel that this is an infectious process.  She has no respiratory complaints. Will check  basic labs primarily to assess renal function. If [place] is negative, would place her on low dose of diuretic her a few days. Continue to keep elevated when she is off of them. Recommended compression stockings.   Final Clinical Impressions(s) / ED Diagnoses   Final diagnoses:  Peripheral edema    ED Discharge Orders    None       Raeford Razor, MD 11/09/17 1633    Raeford Razor, MD 01/08/18 [phone]

## 2017-11-09 NOTE — Telephone Encounter (Signed)
Called in c/o bilateral leg swelling that has made her "legs double in size".  The right leg started out being more swollen.  This has been going on for 4-5 days.  Her legs are painful, swollen and she stated she is unable to walk.  She is swollen above her knees.  She can't bend her right knee at all.  She is in bed with her legs elevated and her husband is putting ice on her legs to help with the pain.   She is diabetic. She also said she had cellulitis in her left leg 3 yrs ago and ended up in the hospital on antibiotics in [place]. She has agreed to go to the [facility] ED.  If she is unable to walk she will call 911 to be transported to the ED.  Husband is with her. I routed a note to Dr. Cyndie Chime nurse pool letting them know she is going to the ED.  Reason for Disposition . [1] Can't walk or can barely walk AND [2] new onset  Answer Assessment - Initial Assessment Questions 1. ONSET: "When did the swelling start?" (e.g., minutes, hours, days)     4-5 days ago.   Right leg was swollen first now both legs are swollen. 2. LOCATION: "What part of the leg is swollen?"  "Are both legs swollen or just one leg?"     Starts at the ankle to the knee but now above the knee.  Both legs 3. SEVERITY: "How bad is the swelling?" (e.g., localized; mild, moderate, severe)  - Localized - small area of swelling localized to one leg  - MILD pedal edema - swelling limited to foot and ankle, pitting edema < 1/4 inch (6 mm) deep, rest and elevation eliminate most or all swelling  - MODERATE edema - swelling of lower leg to knee, pitting edema > 1/4 inch (6 mm) deep, rest and elevation only partially reduce swelling  - SEVERE edema - swelling extends above knee, facial or hand swelling present      Painful.   Doubled in size of my legs.   I'm not able to bend the knee to walk.  I'm in bed with my legs elevated.   My husband is putting ice packs on my legs to help the pain. 4. REDNESS: "Does the swelling  look red or infected?"     Both legs very red and swollen and hurt. 5. PAIN: "Is the swelling painful to touch?" If so, ask: "How painful is it?"   (Scale 1-10; mild, moderate or severe)     Yes   Using ice to help with the pain. 6. FEVER: "Do you have a fever?" If so, ask: "What is it, how was it measured, and when did it start?"      No fever 7. CAUSE: "What do you think is causing the leg swelling?"     I had a cellulitis infection in my left leg that I ended up in the hospital in [place] with IV antibiotics. 8. MEDICAL HISTORY: "Do you have a history of heart failure, kidney disease, liver failure, or cancer?"     None of the above 9. RECURRENT SYMPTOM: "Have you had leg swelling before?" If so, ask: "When was the last time?" "What happened that time?"     Yes   3 yrs ago I had cellulitis and was in the hospital on IV antibiotics in [place]. 10. OTHER SYMPTOMS: "Do you have any other symptoms?" (e.g., chest pain, difficulty  breathing)       I'm diabetic.    11. PREGNANCY: "Is there any chance you are pregnant?" "When was your last menstrual period?"       Not asked due to age  Protocols used: LEG SWELLING AND EDEMA-A-AH

## 2017-11-09 NOTE — ED Notes (Signed)
Patient transported to Ultrasound 

## 2017-11-11 NOTE — Progress Notes (Deleted)
[facility] [address] [place], [place] [address] [phone]  Date:  11/14/2017   Name:  Nicole Mccann   DOB:  02-09-1946   MRN:  [id number]  PCP:  Pearline Cables, MD    Chief Complaint: No chief complaint on file.   History of Present Illness:  Nicole Mccann is a 72 y.o. very pleasant female patient who presents with the following:  She was in the ER on 72/78-   72 year old female with intermittent swelling in bilateral lower extremities.  Also some redness to bilateral shins.  Exam seems most consistent with venous stasis.  She does report some pain behind her right knee and posterior thigh which I cannot clearly attribute to simply this though.  Will obtain ultrasound to evaluate for possible DVT.  I do not feel that this is an infectious process. She has no respiratory complaints. Will check basic labs primarily to assess renal function. If [place] is negative, would place her on low dose of diuretic her a few days. Continue to keep elevated when she is off of them. Recommended compression stockings.   Patient Active Problem List   Diagnosis Date Noted  . Dizziness and giddiness 12/22/2016  . Osteopenia 09/21/2016  . Diabetic polyneuropathy associated with diabetes mellitus due to underlying condition (HCC) 06/03/2016  . Low back pain 05/26/2016  . Encounter for medication management 04/15/2015  . Right shoulder pain 04/04/2015  . Adjustment disorder with mixed anxiety and depressed mood 03/14/2015  . Rotator cuff (capsule) sprain 03/13/2015  . Pseudoptosis 11/18/2014  . Seizure disorder, temporal lobe, intractable (HCC) 10/17/2014  . 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 Severe obesity (BMI >= 40) (HCC) 10/17/2014  . OSA on CPAP 10/17/2014  . Hypersomnia with sleep apnea, unspecified 05/22/2014  . Injury of left knee 08/22/2013  .  Variants of migraine, not elsewhere classified, without mention of intractable migraine without mention of status migrainosus 05/22/2013  . 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 Spinal stenosis of lumbar region 12/17/2012  . Pulmonary embolism (HCC) 07/09/2012  . Dyspnea and respiratory abnormality 07/07/2012  . DVT (deep venous thrombosis) (HCC) 07/07/2012  . Atypical seizure (HCC) 06/07/2012  . Phlebitis following infusion 06/05/2012  . BENIGN POSITIONAL VERTIGO 11/06/2009  . DIABETES MELLITUS, TYPE II 04/04/2009  . ALLERGIC RHINITIS 04/04/2009  . ASTHMA 04/04/2009  . DIVERTICULOSIS, COLON 04/04/2009  . HYPERLIPIDEMIA NEC/NOS 06/29/2007  . DISORDER, DYSMETABOLIC SYNDROME X 06/29/2007    Past Medical History:  Diagnosis Date  . Allergy   . Anxiety    Dr Madaline Guthrie  . Asthma   . Atypical seizure (HCC) 06/07/2012  . Depression    Dr Madaline Guthrie  . Diabetes mellitus   . DVT (deep venous thrombosis) (HCC)   . Headache(784.0)   . Hyperlipidemia   . Hypersomnia with sleep apnea, unspecified 05/22/2014  . Olfactory aura   . OSA on CPAP   . Shingles 07/2013    Past Surgical History:  Procedure Laterality Date  . APPENDECTOMY    . BREAST EXCISIONAL BIOPSY Right 1998  . CHOLECYSTECTOMY    . COLONOSCOPY  2006   [facility]  . rotator cuff surgery    . TONSILLECTOMY       Social History   Tobacco Use  . Smoking status: Never Smoker  . Smokeless tobacco: Never Used  Substance Use Topics  .  Alcohol use: Yes    Alcohol/week: 1.8 oz    Types: 3 Glasses of wine per week    Comment: 1/2 glass wine daily  . Drug use: No    Family History  Problem Relation Age of Onset  . Stomach cancer Father   . Coronary artery disease Father   . Breast cancer Sister 45       breast cancer  . Diabetes Maternal Aunt   . Diabetes Paternal Aunt   . Prostate cancer Brother   . Cancer Daughter   . Stroke Neg Hx     Allergies  Allergen Reactions  . Mushroom Ext  Cmplx(Shiitake-Reishi-Mait) Anaphylaxis  . Shellfish Allergy Anaphylaxis  . Sulfonamide Derivatives Anaphylaxis  . Fluticasone-Salmeterol Other (See Comments)    Feels like something in the throat  . Butalbital-Aspirin-Caffeine     unknown  . Levofloxacin Other (See Comments)  . Montelukast Sodium     unknown  . Moxifloxacin     unknown  . Orphenadrine Citrate     unknown  . Penicillins     unknown  . Acetaminophen Other (See Comments)    Stomach discomfort  . Clarithromycin Nausea And Vomiting  . Iodinated Diagnostic Agents Other (See Comments)    Bad headache; no prep needed  . Orphenadrine Nausea And Vomiting    Medication list has been reviewed and updated.  Current Outpatient Medications on File Prior to Visit  Medication Sig Dispense Refill  . Acetaminophen (TYLENOL EXTRA STRENGTH PO) Take 1 tablet by mouth as needed.    Marland Kitchen albuterol (PROVENTIL HFA;VENTOLIN HFA) 108 (90 Base) MCG/ACT inhaler Inhale 2 puffs into the lungs every 6 (six) hours as needed for wheezing or shortness of breath. VENTOLIN HFA 1 Inhaler 2  . furosemide (LASIX) 20 MG tablet Take 1 tablet (20 mg total) by mouth daily. 4 tablet 0  . metFORMIN (GLUCOPHAGE-XR) 750 MG 24 hr tablet 1 tablet daily. Taken if CBG is >120 two days in a row    . naproxen (NAPROSYN) 500 MG tablet Take 1 tablet (500 mg total) by mouth 2 (two) times daily as needed. 60 tablet 2  . potassium chloride SA (K-DUR,KLOR-CON) 20 MEQ tablet Take 1 tablet (20 mEq total) by mouth daily. 4 tablet 0  . prednisoLONE acetate (PRED FORTE) 1 % ophthalmic suspension      No current facility-administered medications on file prior to visit.     Review of Systems:  As per HPI- otherwise negative.   Physical Examination: There were no vitals filed for this visit. There were no vitals filed for this visit. There is no height or weight on file to calculate BMI. Ideal Body Weight:    GEN: WDWN, NAD, Non-toxic, A & O x 3 HEENT: Atraumatic,  Normocephalic. Neck supple. No masses, No LAD. Ears and Nose: No external deformity. CV: RRR, No M/G/R. No JVD. No thrill. No extra heart sounds. PULM: CTA B, no wheezes, crackles, rhonchi. No retractions. No resp. distress. No accessory muscle use. ABD: S, NT, ND, +BS. No rebound. No HSM. EXTR: No c/c/e NEURO Normal gait.  PSYCH: Normally interactive. Conversant. Not depressed or anxious appearing.  Calm demeanor.    Assessment and Plan: ***  Signed Abbe Amsterdam, MD

## 2017-11-14 ENCOUNTER — Ambulatory Visit: Payer: Medicare Other | Admitting: Family Medicine

## 2017-11-14 ENCOUNTER — Encounter: Payer: Self-pay | Admitting: Family Medicine

## 2017-11-14 VITALS — BP 120/60 | HR 82 | Temp 97.8°F | Resp 16 | Ht 60.0 in | Wt 265.8 lb

## 2017-11-14 DIAGNOSIS — I872 Venous insufficiency (chronic) (peripheral): Secondary | ICD-10-CM

## 2017-11-14 MED ORDER — FUROSEMIDE 20 MG PO TABS
20.0000 mg | ORAL_TABLET | Freq: Every day | ORAL | 1 refills | Status: DC
Start: 2017-11-14 — End: 2018-04-18

## 2017-11-14 NOTE — Progress Notes (Signed)
[facility] [address] [place], [place] [address] [phone]  Date:  11/14/2017   Name:  Nicole Mccann   DOB:  09-17-1946   MRN:  [id number]  PCP:  Pearline Cables, MD    Chief Complaint: No chief complaint on file.   History of Present Illness:  Nicole Mccann is a 72 y.o. very pleasant female patient who presents with the following:  She was in the ER with leg swelling on 57/72  72 year old female with intermittent swelling in bilateral lower extremities.  Also some redness to bilateral shins.  Exam seems most consistent with venous stasis.  She does report some pain behind her right knee and posterior thigh which I cannot clearly attribute to simply this though.  Will obtain ultrasound to evaluate for possible DVT.  I do not feel that this is an infectious process. She has no respiratory complaints. Will check basic labs primarily to assess renal function. If [place] is negative, would place her on low dose of diuretic her a few days. Continue to keep elevated when she is off of them. Recommended compression stockings.   She is has what looks like knee compression wraps that she is wearing on her shins- she is not able to manage the full pull on compression socks.  The wraps she is using actually do seem to be working pretty well Her legs do look a lot better Pt notes that about 3 years ago she had swelling and cellulitis of her left leg that led to a 10 day hospital stay in [place]  She had had some swelling of her leg for years, off and on.  3 years ago was the first time it got bad.  Since then it has bothered her off and on  She was given lasix 20/ day and potassium dail, but just for 4 days.  She took her last dose 48 hours ago She felt like the lasix was really working as she urinated a lot while she was taking it!  Her legs also look and feel much better Wt Readings from Last 3 Encounters:  11/14/17 265  lb 12.8 oz (120.6 kg)  11/09/17 263 lb (119.3 kg)  06/07/17 267 lb (121.1 kg)   She does also continue to have sciatica in her back - she put an OTC pain patch on her back for this Pt notes that she may have a hard time walking longer distances as her back will hurt However, we have done a thorough evaluation already for DOE-  She has seen Dr. Allyson Sabal and also Ramaswamy with pulmonology last February for her SOB- she had an echo as below: Left ventricle: The cavity size was normal. Wall thickness was   normal. Systolic function was normal. The estimated ejection   fraction was in the range of 55% to 60%. Wall motion was normal;   there were no regional wall motion abnormalities. Doppler   parameters are consistent with abnormal left ventricular   relaxation (grade 1 diastolic dysfunction).   Her endocrinologist- Dr. Chestine Spore- is retiring and she needs a new endocrinologist. Suggested Dr. Elvera Lennox unless Dr. Chestine Spore has another recommendation  Reviewed recent US report as below:  US Venous Img Lower Unilateral Right  Result Date: 11/09/2017 CLINICAL DATA:  Right lower extremity pain and edema for the past 3 days. History prior DVT. Evaluate for acute or chronic DVT. EXAM: RIGHT LOWER EXTREMITY VENOUS DOPPLER ULTRASOUND TECHNIQUE:  Gray-scale sonography with graded compression, as well as color Doppler and duplex ultrasound were performed to evaluate the lower extremity deep venous systems from the level of the common femoral vein and including the common femoral, femoral, profunda femoral, popliteal and calf veins including the posterior tibial, peroneal and gastrocnemius veins when visible. The superficial great saphenous vein was also interrogated. Spectral Doppler was utilized to evaluate flow at rest and with distal augmentation maneuvers in the common femoral, femoral and popliteal veins. COMPARISON:  None. FINDINGS: Contralateral Common Femoral Vein: Respiratory phasicity is normal and symmetric  with the symptomatic side. No evidence of thrombus. Normal compressibility. Common Femoral Vein: No evidence of thrombus. Normal compressibility, respiratory phasicity and response to augmentation. Saphenofemoral Junction: No evidence of thrombus. Normal compressibility and flow on color Doppler imaging. Profunda Femoral Vein: No evidence of thrombus. Normal compressibility and flow on color Doppler imaging. Femoral Vein: No evidence of thrombus. Normal compressibility, respiratory phasicity and response to augmentation. Popliteal Vein: No evidence of thrombus. Normal compressibility, respiratory phasicity and response to augmentation. Calf Veins: No evidence of thrombus. Normal compressibility and flow on color Doppler imaging. Superficial Great Saphenous Vein: No evidence of thrombus. Normal compressibility. Venous Reflux:  None. Other Findings:  None. IMPRESSION: No evidence of acute or chronic DVT within the right lower extremity. Electronically Signed   By: Simonne Come M.D.   On: 11/09/2017 17:19    Patient Active Problem List   Diagnosis Date Noted  . Dizziness and giddiness 12/22/2016  . Osteopenia 09/21/2016  . Diabetic polyneuropathy associated with diabetes mellitus due to underlying condition (HCC) 06/03/2016  . Low back pain 05/26/2016  . Encounter for medication management 04/15/2015  . Right shoulder pain 04/04/2015  . Adjustment disorder with mixed anxiety and depressed mood 03/14/2015  . Rotator cuff (capsule) sprain 03/13/2015  . Pseudoptosis 11/18/2014  . Seizure disorder, temporal lobe, intractable (HCC) 10/17/2014  . 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 Severe obesity (BMI >= 40) (HCC) 10/17/2014  . OSA on CPAP 10/17/2014  . Hypersomnia with sleep apnea, unspecified 05/22/2014  . Injury of left knee 08/22/2013  . Variants of migraine, not elsewhere classified, without mention of intractable migraine  without mention of status migrainosus 05/22/2013  . 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 Spinal stenosis of lumbar region 12/17/2012  . Pulmonary embolism (HCC) 07/09/2012  . Dyspnea and respiratory abnormality 07/07/2012  . DVT (deep venous thrombosis) (HCC) 07/07/2012  . Atypical seizure (HCC) 06/07/2012  . Phlebitis following infusion 06/05/2012  . BENIGN POSITIONAL VERTIGO 11/06/2009  . DIABETES MELLITUS, TYPE II 04/04/2009  . ALLERGIC RHINITIS 04/04/2009  . ASTHMA 04/04/2009  . DIVERTICULOSIS, COLON 04/04/2009  . HYPERLIPIDEMIA NEC/NOS 06/29/2007  . DISORDER, DYSMETABOLIC SYNDROME X 06/29/2007    Past Medical History:  Diagnosis Date  . Allergy   . Anxiety    Dr Madaline Guthrie  . Asthma   . Atypical seizure (HCC) 06/07/2012  . Depression    Dr Madaline Guthrie  . Diabetes mellitus   . DVT (deep venous thrombosis) (HCC)   . Headache(784.0)   . Hyperlipidemia   . Hypersomnia with sleep apnea, unspecified 05/22/2014  . Olfactory aura   . OSA on CPAP   . Shingles 07/2013    Past Surgical History:  Procedure Laterality Date  . APPENDECTOMY    . BREAST EXCISIONAL BIOPSY Right 1998  . CHOLECYSTECTOMY    . COLONOSCOPY  2006   [facility]  .  rotator cuff surgery    . TONSILLECTOMY       Social History   Tobacco Use  . Smoking status: Never Smoker  . Smokeless tobacco: Never Used  Substance Use Topics  . Alcohol use: Yes    Alcohol/week: 1.8 oz    Types: 3 Glasses of wine per week    Comment: 1/2 glass wine daily  . Drug use: No    Family History  Problem Relation Age of Onset  . Stomach cancer Father   . Coronary artery disease Father   . Breast cancer Sister 77       breast cancer  . Diabetes Maternal Aunt   . Diabetes Paternal Aunt   . Prostate cancer Brother   . Cancer Daughter   . Stroke Neg Hx     Allergies  Allergen Reactions  . Mushroom Ext Cmplx(Shiitake-Reishi-Mait) Anaphylaxis  . Shellfish Allergy Anaphylaxis  . Sulfonamide  Derivatives Anaphylaxis  . Fluticasone-Salmeterol Other (See Comments)    Feels like something in the throat  . Butalbital-Aspirin-Caffeine     unknown  . Levofloxacin Other (See Comments)  . Montelukast Sodium     unknown  . Moxifloxacin     unknown  . Orphenadrine Citrate     unknown  . Penicillins     unknown  . Acetaminophen Other (See Comments)    Stomach discomfort  . Clarithromycin Nausea And Vomiting  . Iodinated Diagnostic Agents Other (See Comments)    Bad headache; no prep needed  . Orphenadrine Nausea And Vomiting    Medication list has been reviewed and updated.  Current Outpatient Medications on File Prior to Visit  Medication Sig Dispense Refill  . Acetaminophen (TYLENOL EXTRA STRENGTH PO) Take 1 tablet by mouth as needed.    Marland Kitchen albuterol (PROVENTIL HFA;VENTOLIN HFA) 108 (90 Base) MCG/ACT inhaler Inhale 2 puffs into the lungs every 6 (six) hours as needed for wheezing or shortness of breath. VENTOLIN HFA (Patient not taking: Reported on 11/14/2017) 1 Inhaler 2  . furosemide (LASIX) 20 MG tablet Take 1 tablet (20 mg total) by mouth daily. 4 tablet 0  . metFORMIN (GLUCOPHAGE-XR) 750 MG 24 hr tablet 1 tablet daily. Taken if CBG is >120 two days in a row    . naproxen (NAPROSYN) 500 MG tablet Take 1 tablet (500 mg total) by mouth 2 (two) times daily as needed. 60 tablet 2  . potassium chloride SA (K-DUR,KLOR-CON) 20 MEQ tablet Take 1 tablet (20 mEq total) by mouth daily. (Patient not taking: Reported on 11/14/2017) 4 tablet 0  . prednisoLONE acetate (PRED FORTE) 1 % ophthalmic suspension      No current facility-administered medications on file prior to visit.     Review of Systems:  As per HPI- otherwise negative. No fever or chills No flu like sx   Physical Examination: Vitals:   11/14/17 1144  BP: 120/60  Pulse: 82  Resp: 16  Temp: 97.8 F (36.6 C)  SpO2: 97%   Vitals:   11/14/17 1144  Weight: 265 lb 12.8 oz (120.6 kg)  Height: 5' (1.524 m)   Body  mass index is 51.91 kg/m. Ideal Body Weight: Weight in (lb) to have BMI = 25: 127.7  GEN: WDWN, NAD, Non-toxic, A & O x 3, obese, here with her husband today HEENT: Atraumatic, Normocephalic. Neck supple. No masses, No LAD. Ears and Nose: No external deformity. CV: RRR, No M/G/R. No JVD. No thrill. No extra heart sounds. PULM: CTA B, no wheezes, crackles, rhonchi.  No retractions. No resp. distress. No accessory muscle use. EXTR: No c/c/.  Her feet look ok- no skin breakdown. She has really minimal to no edema of her legs at his time, but does show hyperpigmentation c/w chronic statis dermatitis  NEURO Normal gait for pt- slow, uses a walker PSYCH: Normally interactive. Conversant. Not depressed or anxious appearing.  Calm demeanor.    Assessment and Plan: Venous insufficiency - Plan: furosemide (LASIX) 20 MG tablet  ER follow-up It appears that she has chronic venous insufficiency causing intermittent swelling of her legs Suggested zip- up compression socks for her.  rx for lasix to use as needed for swelling.  She wonders about potassium- explained that this will depend on how much she is using the lasix Asked her to come back in about one month to check on her progress and do BMP   Signed Abbe Amsterdam, MD

## 2017-11-14 NOTE — Patient Instructions (Addendum)
Good to see you today- your legs look better!   I refilled your furosemide- you can take this once a day as needed for swelling.  If your legs are not swollen you do not need to take it Some zip- up compression socks may be easier for you to get on and off.    Please see me in about 4 weeks for a recheck I would recommend Dr. Elvera Lennox for endocrinology care once Dr. Chestine Spore has retired

## 2017-12-10 NOTE — Progress Notes (Signed)
[facility] at [facility] [address] [place], [place] [address] [phone]  Date:  12/12/2017   Name:  Nicole Mccann   DOB:  09/20/1946   MRN:  [id number]  PCP:  Pearline Cables, MD    Chief Complaint: Follow-up (Pt here for 4 week recheck. )   History of Present Illness:  Nicole Mccann is a 72 y.o. very pleasant female patient who presents with the following:  Following up from recent recheck visit, last visit with me on 11/14/17 She was in the ER with leg swelling on 1/2---------------------------------------------------- 72 year old female with intermittent swelling in bilateral lower extremities. Also some redness to bilateral shins. Exam seems most consistent with venous stasis.She does report some pain behind her right knee and posterior thigh which I cannot clearly attribute to simply this though. Will obtain ultrasound to evaluate for possible DVT. I do not feel that this is an infectious process. She has no respiratory complaints. Will check basic labs primarily to assess renal function. If [place] is negative, would place her on low dose of diuretic her a few days. Continue to keep elevated when she is off of them. Recommended compression stockings.  She is has what looks like knee compression wraps that she is wearing on her shins- she is not able to manage the full pull on compression socks.  The wraps she is using actually do seem to be working pretty well Her legs do look a lot better Pt notes that about 3 years ago she had swelling and cellulitis of her left leg that led to a 10 day hospital stay in [place] She had had some swelling of her leg for years, off and on.  3 years ago was the first time it got bad.  Since then it has bothered her off and on She was given lasix 20/ day and potassium dail, but just for 4 days.  She took her last dose 48 hours ago She felt like the lasix was really working as she  urinated a lot while she was taking it!   She did not yet get the zip up compression socks but hopes to get these soon- her daughter will help her get them Her swelling is not bad today- they are worse when the weather is hot She is only taking her lasix on occasion- she maybe took it 2 weeks ago She does not like to take it on a regular basis as she has to go up or down stairs to get to the restroom at home which is a challenge No issues with her breathing Her legs are not hurting her, but they do feel warm and can be tingly at night   She had an [place] negative for DVT in January  She does best if she can elevate her legs   essentially normal echo a year ago: Left ventricle: The cavity size was normal. Wall thickness was   normal. Systolic function was normal. The estimated ejection   fraction was in the range of 55% to 60%. Wall motion was normal;   there were no regional wall motion abnormalities. Doppler   parameters are consistent with abnormal left ventricular   relaxation (grade 1 diastolic dysfunction). Wt Readings from Last 3 Encounters:  12/12/17 270 lb (122.5 kg)  11/14/17 265 lb 12.8 oz (120.6 kg)  11/09/17 263 lb (119.3 kg)   She has an appt with her new endocrinologist next month- DR.  Gherghe  She will spend some time at their daughter's home coming up - she lives in a single level apt which will be a nice break  She plans to see her eye doctor soon- reminded her to do so   Here today with her husband who helps with the history today  Patient Active Problem List   Diagnosis Date Noted  . Dizziness and giddiness 12/22/2016  . Osteopenia 09/21/2016  . Diabetic polyneuropathy associated with diabetes mellitus due to underlying condition (HCC) 06/03/2016  . Low back pain 05/26/2016  . Encounter for medication management 04/15/2015  . Right shoulder pain 04/04/2015  . Adjustment disorder with mixed anxiety and depressed mood 03/14/2015  . Rotator cuff (capsule) sprain  03/13/2015  . Pseudoptosis 11/18/2014  . Seizure disorder, temporal lobe, intractable (HCC) 10/17/2014  . 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 Severe obesity (BMI >= 40) (HCC) 10/17/2014  . OSA on CPAP 10/17/2014  . Hypersomnia with sleep apnea, unspecified 05/22/2014  . Injury of left knee 08/22/2013  . Variants of migraine, not elsewhere classified, without mention of intractable migraine without mention of status migrainosus 05/22/2013  . 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 Spinal stenosis of lumbar region 12/17/2012  . Pulmonary embolism (HCC) 07/09/2012  . Dyspnea and respiratory abnormality 07/07/2012  . DVT (deep venous thrombosis) (HCC) 07/07/2012  . Atypical seizure (HCC) 06/07/2012  . Phlebitis following infusion 06/05/2012  . BENIGN POSITIONAL VERTIGO 11/06/2009  . DIABETES MELLITUS, TYPE II 04/04/2009  . ALLERGIC RHINITIS 04/04/2009  . ASTHMA 04/04/2009  . DIVERTICULOSIS, COLON 04/04/2009  . HYPERLIPIDEMIA NEC/NOS 06/29/2007  . DISORDER, DYSMETABOLIC SYNDROME X 06/29/2007    Past Medical History:  Diagnosis Date  . Allergy   . Anxiety    Dr Madaline Guthrie  . Asthma   . Atypical seizure (HCC) 06/07/2012  . Depression    Dr Madaline Guthrie  . Diabetes mellitus   . DVT (deep venous thrombosis) (HCC)   . Headache(784.0)   . Hyperlipidemia   . Hypersomnia with sleep apnea, unspecified 05/22/2014  . Olfactory aura   . OSA on CPAP   . Shingles 07/2013    Past Surgical History:  Procedure Laterality Date  . APPENDECTOMY    . BREAST EXCISIONAL BIOPSY Right 1998  . CHOLECYSTECTOMY    . COLONOSCOPY  2006   [facility]  . rotator cuff surgery    . TONSILLECTOMY       Social History   Tobacco Use  . Smoking status: Never Smoker  . Smokeless tobacco: Never Used  Substance Use Topics  . Alcohol use: Yes    Alcohol/week: 1.8 oz    Types: 3 Glasses of wine per week    Comment: 1/2  glass wine daily  . Drug use: No    Family History  Problem Relation Age of Onset  . Stomach cancer Father   . Coronary artery disease Father   . Breast cancer Sister 69       breast cancer  . Diabetes Maternal Aunt   . Diabetes Paternal Aunt   . Prostate cancer Brother   . Cancer Daughter   . Stroke Neg Hx     Allergies  Allergen Reactions  . Mushroom Ext Cmplx(Shiitake-Reishi-Mait) Anaphylaxis  . Shellfish Allergy Anaphylaxis  . Sulfonamide Derivatives Anaphylaxis  . Fluticasone-Salmeterol Other (See Comments)    Feels like something in the throat  . Butalbital-Aspirin-Caffeine     unknown  .  Levofloxacin Other (See Comments)  . Montelukast Sodium     unknown  . Moxifloxacin     unknown  . Orphenadrine Citrate     unknown  . Penicillins     unknown  . Acetaminophen Other (See Comments)    Stomach discomfort  . Clarithromycin Nausea And Vomiting  . Iodinated Diagnostic Agents Other (See Comments)    Bad headache; no prep needed  . Orphenadrine Nausea And Vomiting    Medication list has been reviewed and updated.  Current Outpatient Medications on File Prior to Visit  Medication Sig Dispense Refill  . furosemide (LASIX) 20 MG tablet Take 1 tablet (20 mg total) by mouth daily. Use as needed for swelling of your legs 30 tablet 1  . metFORMIN (GLUCOPHAGE-XR) 750 MG 24 hr tablet 1 tablet daily. Taken if CBG is >120 two days in a row    . OVER THE COUNTER MEDICATION Lidocaine 4% pain relieving gel-patch. Apply one patch to affected area no more than 3 to 4 times daily     No current facility-administered medications on file prior to visit.     Review of Systems:  As per HPI- otherwise negative.   Physical Examination: Vitals:   12/12/17 1116  BP: 118/78  Pulse: 70  Temp: 97.7 F (36.5 C)  SpO2: 98%   Vitals:   12/12/17 1116  Weight: 270 lb (122.5 kg)  Height: 5' (1.524 m)   Body mass index is 52.73 kg/m. Ideal Body Weight: Weight in (lb) to have  BMI = 25: 127.7  GEN: WDWN, NAD, Non-toxic, A & O x 3, obese, looks well HEENT: Atraumatic, Normocephalic. Neck supple. No masses, No LAD. Ears and Nose: No external deformity. CV: RRR, No M/G/R. No JVD. No thrill. No extra heart sounds. PULM: CTA B, no wheezes, crackles, rhonchi. No retractions. No resp. distress. No accessory muscle use. EXTR: No c/c NEURO Normal gait for pt- slow, uses a walker PSYCH: Normally interactive. Conversant. Not depressed or anxious appearing.  Calm demeanor.  No significant edema of her LE today She does have chronic stasis term of the anterior shins, more on the left Cannot palpate DP pulse on the left today Cap refill and perfusion of toes is normal bilaterally    Assessment and Plan: Venous insufficiency  Medication monitoring encounter - Plan: Basic metabolic panel  Controlled type 2 diabetes mellitus without complication, without long-term current use of insulin (HCC) - Plan: Hemoglobin A1c  Asked her to monitor her daily weights, and to use lasix if she goes up several pounds.  This is a challenge however as she has to use stairs to get to a bathroom in her home.  They have considered moving to a single level home but have not made a move in this direction as of yet  Await her labs and will follow-up with her   Signed Abbe Amsterdam, MD  Received her labs as below, message to pt   Results for orders placed or performed in visit on 12/12/17  Basic metabolic panel  Result Value Ref Range   Sodium 140 135 - 145 mEq/L   Potassium 5.0 3.5 - 5.1 mEq/L   Chloride 105 96 - 112 mEq/L   CO2 30 19 - 32 mEq/L   Glucose, Bld 125 (H) 70 - 99 mg/dL   BUN 16 6 - 23 mg/dL   Creatinine, Ser 6.57 0.40 - 1.20 mg/dL   Calcium 9.2 8.4 - 84.6 mg/dL   GFR 96.29 >52.84 mL/min  Hemoglobin A1c  Result Value Ref Range   Hgb A1c MFr Bld 6.4 4.6 - 6.5 %

## 2017-12-12 ENCOUNTER — Encounter: Payer: Self-pay | Admitting: Family Medicine

## 2017-12-12 ENCOUNTER — Ambulatory Visit: Payer: Medicare Other | Admitting: Family Medicine

## 2017-12-12 VITALS — BP 118/78 | HR 70 | Temp 97.7°F | Ht 60.0 in | Wt 270.0 lb

## 2017-12-12 DIAGNOSIS — I872 Venous insufficiency (chronic) (peripheral): Secondary | ICD-10-CM | POA: Diagnosis not present

## 2017-12-12 DIAGNOSIS — E119 Type 2 diabetes mellitus without complications: Secondary | ICD-10-CM | POA: Diagnosis not present

## 2017-12-12 DIAGNOSIS — Z5181 Encounter for therapeutic drug level monitoring: Secondary | ICD-10-CM

## 2017-12-12 LAB — BASIC METABOLIC PANEL
BUN: 16 mg/dL (ref 6–23)
CHLORIDE: 105 meq/L (ref 96–112)
CO2: 30 meq/L (ref 19–32)
Calcium: 9.2 mg/dL (ref 8.4–10.5)
Creatinine, Ser: 0.88 mg/dL (ref 0.40–1.20)
GFR: 66.77 mL/min (ref >60.00)
GLUCOSE: 125 mg/dL — AB (ref 70–99)
Potassium: 5 mEq/L (ref 3.5–5.1)
SODIUM: 140 meq/L (ref 135–145)

## 2017-12-12 LAB — HEMOGLOBIN A1C: Hgb A1c MFr Bld: 6.4 % (ref 4.6–6.5)

## 2017-12-12 NOTE — Patient Instructions (Signed)
Please monitor your weight on a regular basis.  If you seem to have gone up several pounds rapidly, this may be due to fluid retention.  In that case take your lasix for a few days until your weight returns to normal

## 2017-12-28 ENCOUNTER — Encounter: Payer: Self-pay | Admitting: Family Medicine

## 2018-01-17 ENCOUNTER — Encounter: Payer: Self-pay | Admitting: Internal Medicine

## 2018-01-17 ENCOUNTER — Ambulatory Visit: Payer: Medicare Other | Admitting: Internal Medicine

## 2018-01-17 VITALS — BP 122/74 | HR 84 | Temp 97.8°F | Ht 60.0 in | Wt 267.0 lb

## 2018-01-17 DIAGNOSIS — E1142 Type 2 diabetes mellitus with diabetic polyneuropathy: Secondary | ICD-10-CM

## 2018-01-17 MED ORDER — GLUCOSE BLOOD VI STRP: ORAL_STRIP | 3 refills | Status: DC

## 2018-01-17 NOTE — Progress Notes (Signed)
Patient ID: Nicole Mccann, female   DOB: 03-18-1946, 72 y.o.   MRN: [id number]   HPI: Nicole Mccann is a 72 y.o.-year-old female, referred by her PCP, Dr. Dallas Mccann, for management of DM2, dx in initially in 1965 with GDM, then again GDM in 1974, non-insulin-dependent, uncontrolled, with long-term complications (PN). She saw Dr. Chestine Mccann before, but he is retiring.  She is here with her husband who offers part of the history, especially related to her diet and her past medical history.  Last hemoglobin A1c was: Lab Results  Component Value Date   HGBA1C 6.4 12/12/2017   HGBA1C 6.2 02/09/2017   HGBA1C 6.1 01/21/2016   Patient is now off medicines.    She was previously on: Metformin ER 750 mg once a day if the sugars >120 (she was taking this on a as needed basis, maybe once a week)- not in last 2 weeks b/c AP + diarrhea   Pt checks her sugars 1-2x a day and they are: - am: 107-134 - 2h after b'fast: n/c - before lunch: n/c - 2h after lunch: n/c - before dinner: n/c - 2h after dinner: n/c - bedtime: n/c - nighttime: n/c No lows. Lowest sugar was 111; she has hypoglycemia awareness at 70.   Glucometer: One Touch Verio  Pt's meals are: - Breakfast: cereals, yoghurt (Activia), pastries  - Lunch: salad, leftover sandwich, fruit - Dinner: meat, veggies, potato - Snacks: Sweets, she has wine every day, occasionally sweet liquor  After a visit to her daughter in [place] in the last month, she is now doing intermittent fasting >> stops eating at 4 pm  - no CKD, last BUN/creatinine:  Lab Results  Component Value Date   BUN 16 12/12/2017   BUN 18 11/09/2017   CREATININE 0.88 12/12/2017   CREATININE 0.79 11/09/2017   - last set of lipids: Lab Results  Component Value Date   CHOL 245 (A) 02/09/2017   HDL 59 02/09/2017   LDLCALC 154 02/09/2017   LDLDIRECT 107.9 07/25/2012   TRIG 162 (A) 02/09/2017   CHOLHDL 6 07/25/2012  She is not on a statin. - last eye exam was  in 09/2017. No DR. + cataracts. - no numbness and tingling in her feet.  Pt has FH of DM in aunts.  She also has a history of OSA  ROS: Constitutional: + Weight gain, no fatigue, no subjective hyperthermia/hypothermia Eyes: no blurry vision, no xerophthalmia ENT: no sore throat, no nodules palpated in throat, no dysphagia/odynophagia, no hoarseness Cardiovascular: no CP/SOB/palpitations/+ leg swelling Respiratory: no cough/SOB Gastrointestinal: no N/V/D/C Musculoskeletal: no muscle/joint aches Skin: no rashes Neurological: no tremors/numbness/tingling/dizziness Psychiatric: no depression/anxiety  Past Medical History:  Diagnosis Date  . Allergy   . Anxiety    [name]  . Asthma   . Atypical seizure (HCC) 06/07/2012  . Depression    [name]  . Diabetes mellitus   . DVT (deep venous thrombosis) (HCC)   . Headache(784.0)   . Hyperlipidemia   . Hypersomnia with sleep apnea, unspecified 05/22/2014  . Olfactory aura   . OSA on CPAP   . Shingles 07/2013   Past Surgical History:  Procedure Laterality Date  . APPENDECTOMY    . BREAST EXCISIONAL BIOPSY Right 1998  . CHOLECYSTECTOMY    . COLONOSCOPY  2006   [facility]  . rotator cuff surgery    . TONSILLECTOMY      Social History   Socioeconomic History  . Marital status: Married  Spouse name: Nicole Mccann  . Number of children: 2  . Years of education: College  . Highest education level: Not on file  Social Needs  . Financial resource strain: Not on file  . Food insecurity - worry: Not on file  . Food insecurity - inability: Not on file  . Transportation needs - medical: Not on file  . Transportation needs - non-medical: Not on file  Occupational History  . Not on file  Tobacco Use  . Smoking status: Never Smoker  . Smokeless tobacco: Never Used  Substance and Sexual Activity  . Alcohol use: Yes    Alcohol/week: 1.8 oz    Types: 3 Glasses of wine per week    Comment: 1/2 glass wine daily  . Drug use: No  .  Sexual activity: No  Other Topics Concern  . Not on file  Social History Narrative   Patient is married Nicole Mccann) and lives at home with her husband.   Patient has two adult children.   Patient is retired.   Patient has a college education.   Patient is right-handed.   Patient drinks one cup of tea daily.   Current Outpatient Medications on File Prior to Visit  Medication Sig Dispense Refill  . furosemide (LASIX) 20 MG tablet Take 1 tablet (20 mg total) by mouth daily. Use as needed for swelling of your legs 30 tablet 1  . metFORMIN (GLUCOPHAGE-XR) 750 MG 24 hr tablet 1 tablet daily. Taken if CBG is >120 two days in a row    . OVER THE COUNTER MEDICATION Lidocaine 4% pain relieving gel-patch. Apply one patch to affected area no more than 3 to 4 times daily     No current facility-administered medications on file prior to visit.    Allergies  Allergen Reactions  . Mushroom Ext Cmplx(Shiitake-Reishi-Mait) Anaphylaxis  . Shellfish Allergy Anaphylaxis  . Sulfonamide Derivatives Anaphylaxis  . Fluticasone-Salmeterol Other (See Comments)    Feels like something in the throat  . Butalbital-Aspirin-Caffeine     unknown  . Levofloxacin Other (See Comments)  . Montelukast Sodium     unknown  . Moxifloxacin     unknown  . Orphenadrine Citrate     unknown  . Penicillins     unknown  . Acetaminophen Other (See Comments)    Stomach discomfort  . Clarithromycin Nausea And Vomiting  . Iodinated Diagnostic Agents Other (See Comments)    Bad headache; no prep needed  . Orphenadrine Nausea And Vomiting   Family History  Problem Relation Age of Onset  . Stomach cancer Father   . Coronary artery disease Father   . Breast cancer Sister 74       breast cancer  . Diabetes Maternal Aunt   . Diabetes Paternal Aunt   . Prostate cancer Brother   . Cancer Daughter   . Stroke Neg Hx    PE: BP 122/74   Pulse 84   Temp 97.8 F (36.6 C) (Oral)   Ht 5' (1.524 m)   Wt 267 lb (121.1 kg)    SpO2 96%   BMI 52.14 kg/m  Wt Readings from Last 3 Encounters:  01/17/18 267 lb (121.1 kg)  12/12/17 270 lb (122.5 kg)  11/14/17 265 lb 12.8 oz (120.6 kg)   Constitutional: Obese, in NAD Eyes: PERRLA, EOMI, no exophthalmos ENT: moist mucous membranes, no thyromegaly, no cervical lymphadenopathy Cardiovascular: RRR, No MRG Respiratory: CTA B Gastrointestinal: abdomen soft, NT, ND, BS+ Musculoskeletal: no deformities, strength intact in all 4  Skin: moist, warm, + stasis dermatitis B legs Neurological: no tremor with outstretched hands, DTR normal in all 4  ASSESSMENT: 1. DM2, non-insulin-dependent, uncontrolled, with complications - PN  PLAN:  1. Patient with long-standing, controlled diabetes, now diet controlled, after she stopped metformin ER due to GI side effects: Abdominal pain and bloating.  Her latest HbA1c from 1 month ago was at goal, at 6.4%, but this was higher compared to previous levels.  At that time, she was still taking metformin.  Since then, she stopped metformin but she does not feel that her sugars have increased significantly.  She is trying to adjust her portions and eating less, but still having some sweets.  She recently started intermittent fasting and she stops eating at 4 PM.  I think this will help.  We also discussed about different ways to improve her diet and I suggested to decrease dietary fat and cholesterol to improve her insulin resistance.  I gave him specific examples of the foods that she needs to stop and the foods that she needs to eat.  She is interested in implementing these.  I also strongly advised her to start some form of activity, as she is now very sedentary.  She does walk with a walker.  I suggested a stationary bike that she can buy at a reasonable price and put it in front of the TV.  Walking will also help. - She would like to stay off medicines for now and I think that if she works on her diet and starts exercise, this is definitely  possible. - We will continue her now off metformin and have her back in 3 months for recheck of her sugars and her HbA1c. - I suggested to:  Patient Instructions  You can stay off Metformin for now.  Let me know where to send the prescription for strips.  Start exercising at least 20-30 min a day.  Please let me know if the sugars are consistently <80 or >200.  Please return in 3 months with your sugar log.   - Strongly advised her to start checking sugars at different times of the day - check 1-2 times a day, rotating checks - given sugar log and advised how to fill it and to bring it at next appt  - given foot care handout and explained the principles  - given instructions for hypoglycemia management "15-15 rule"  - advised for yearly eye exams  - Return to clinic in 3 mo with sugar log   Carlus Pavlov, MD PhD [facility]

## 2018-01-17 NOTE — Patient Instructions (Signed)
You can stay off Metformin for now.  Let me know where to send the prescription for strips.  Start exercising at least 20-30 min a day.  Please let me know if the sugars are consistently <80 or >200.  Please return in 3 months with your sugar log.   PATIENT INSTRUCTIONS FOR TYPE 2 DIABETES:  **Please join MyChart!** - see attached instructions about how to join if you have not done so already.  DIET AND EXERCISE Diet and exercise is an important part of diabetic treatment.  We recommended aerobic exercise in the form of brisk walking (working between 40-60% of maximal aerobic capacity, similar to brisk walking) for 150 minutes per week (such as 30 minutes five days per week) along with 3 times per week performing 'resistance' training (using various gauge rubber tubes with handles) 5-10 exercises involving the major muscle groups (upper body, lower body and core) performing 10-15 repetitions (or near fatigue) each exercise. Start at half the above goal but build slowly to reach the above goals. If limited by weight, joint pain, or disability, we recommend daily walking in a swimming pool with water up to waist to reduce pressure from joints while allow for adequate exercise.    BLOOD GLUCOSES Monitoring your blood glucoses is important for continued management of your diabetes. Please check your blood glucoses 2-4 times a day: fasting, before meals and at bedtime (you can rotate these measurements - e.g. one day check before the 3 meals, the next day check before 2 of the meals and before bedtime, etc.).   HYPOGLYCEMIA (low blood sugar) Hypoglycemia is usually a reaction to not eating, exercising, or taking too much insulin/ other diabetes drugs.  Symptoms include tremors, sweating, hunger, confusion, headache, etc. Treat IMMEDIATELY with 15 grams of Carbs: . 4 glucose tablets .  cup regular juice/soda . 2 tablespoons raisins . 4 teaspoons sugar . 1 tablespoon honey Recheck blood  glucose in 15 mins and repeat above if still symptomatic/blood glucose <100.  RECOMMENDATIONS TO REDUCE YOUR RISK OF DIABETIC COMPLICATIONS: * Take your prescribed MEDICATION(S) * Follow a DIABETIC diet: Complex carbs, fiber rich foods, (monounsaturated and polyunsaturated) fats * AVOID saturated/trans fats, high fat foods, >2,300 mg salt per day. * EXERCISE at least 5 times a week for 30 minutes or preferably daily.  * DO NOT SMOKE OR DRINK more than 1 drink a day. * Check your FEET every day. Do not wear tightfitting shoes. Contact us if you develop an ulcer * See your EYE doctor once a year or more if needed * Get a FLU shot once a year * Get a PNEUMONIA vaccine once before and once after age 19 years  GOALS:  * Your Hemoglobin A1c of <7%  * fasting sugars need to be <130 * after meals sugars need to be <180 (2h after you start eating) * Your Systolic BP should be 140 or lower  * Your Diastolic BP should be 80 or lower  * Your HDL (Good Cholesterol) should be 40 or higher  * Your LDL (Bad Cholesterol) should be 100 or lower. * Your Triglycerides should be 150 or lower  * Your Urine microalbumin (kidney function) should be <30 * Your Body Mass Index should be 25 or lower    Please consider the following ways to cut down carbs and fat and increase fiber and micronutrients in your diet: - substitute whole grain for white bread or pasta - substitute brown rice for white rice - substitute 90-calorie  flat bread pieces for slices of bread when possible - substitute sweet potatoes or yams for white potatoes - substitute humus for margarine - substitute tofu for cheese when possible - substitute almond or rice milk for regular milk (would not drink soy milk daily due to concern for soy estrogen influence on breast cancer risk) - substitute dark chocolate for other sweets when possible - substitute water - can add lemon or orange slices for taste - for diet sodas (artificial sweeteners  will trick your body that you can eat sweets without getting calories and will lead you to overeating and weight gain in the long run) - do not skip breakfast or other meals (this will slow down the metabolism and will result in more weight gain over time)  - can try smoothies made from fruit and almond/rice milk in am instead of regular breakfast - can also try old-fashioned (not instant) oatmeal made with almond/rice milk in am - order the dressing on the side when eating salad at a restaurant (pour less than half of the dressing on the salad) - eat as little meat as possible - can try juicing, but should not forget that juicing will get rid of the fiber, so would alternate with eating raw veg./fruits or drinking smoothies - use as little oil as possible, even when using olive oil - can dress a salad with a mix of balsamic vinegar and lemon juice, for e.g. - use agave nectar, stevia sugar, or regular sugar rather than artificial sweateners - steam or broil/roast veggies  - snack on veggies/fruit/nuts (unsalted, preferably) when possible, rather than processed foods - reduce or eliminate aspartame in diet (it is in diet sodas, chewing gum, etc) Read the labels!  Try to read Dr. Janene Harvey book: "Program for Reversing Diabetes" for other ideas for healthy eating.

## 2018-01-26 IMAGING — DX DG CHEST 2V
2 series · 2 of 2 positions shown · non-contrast
Comparison: July 07, 2012

CLINICAL DATA: Dizziness.  Shortness of breath.

EXAM:
CHEST  2 VIEW

[chest pa]
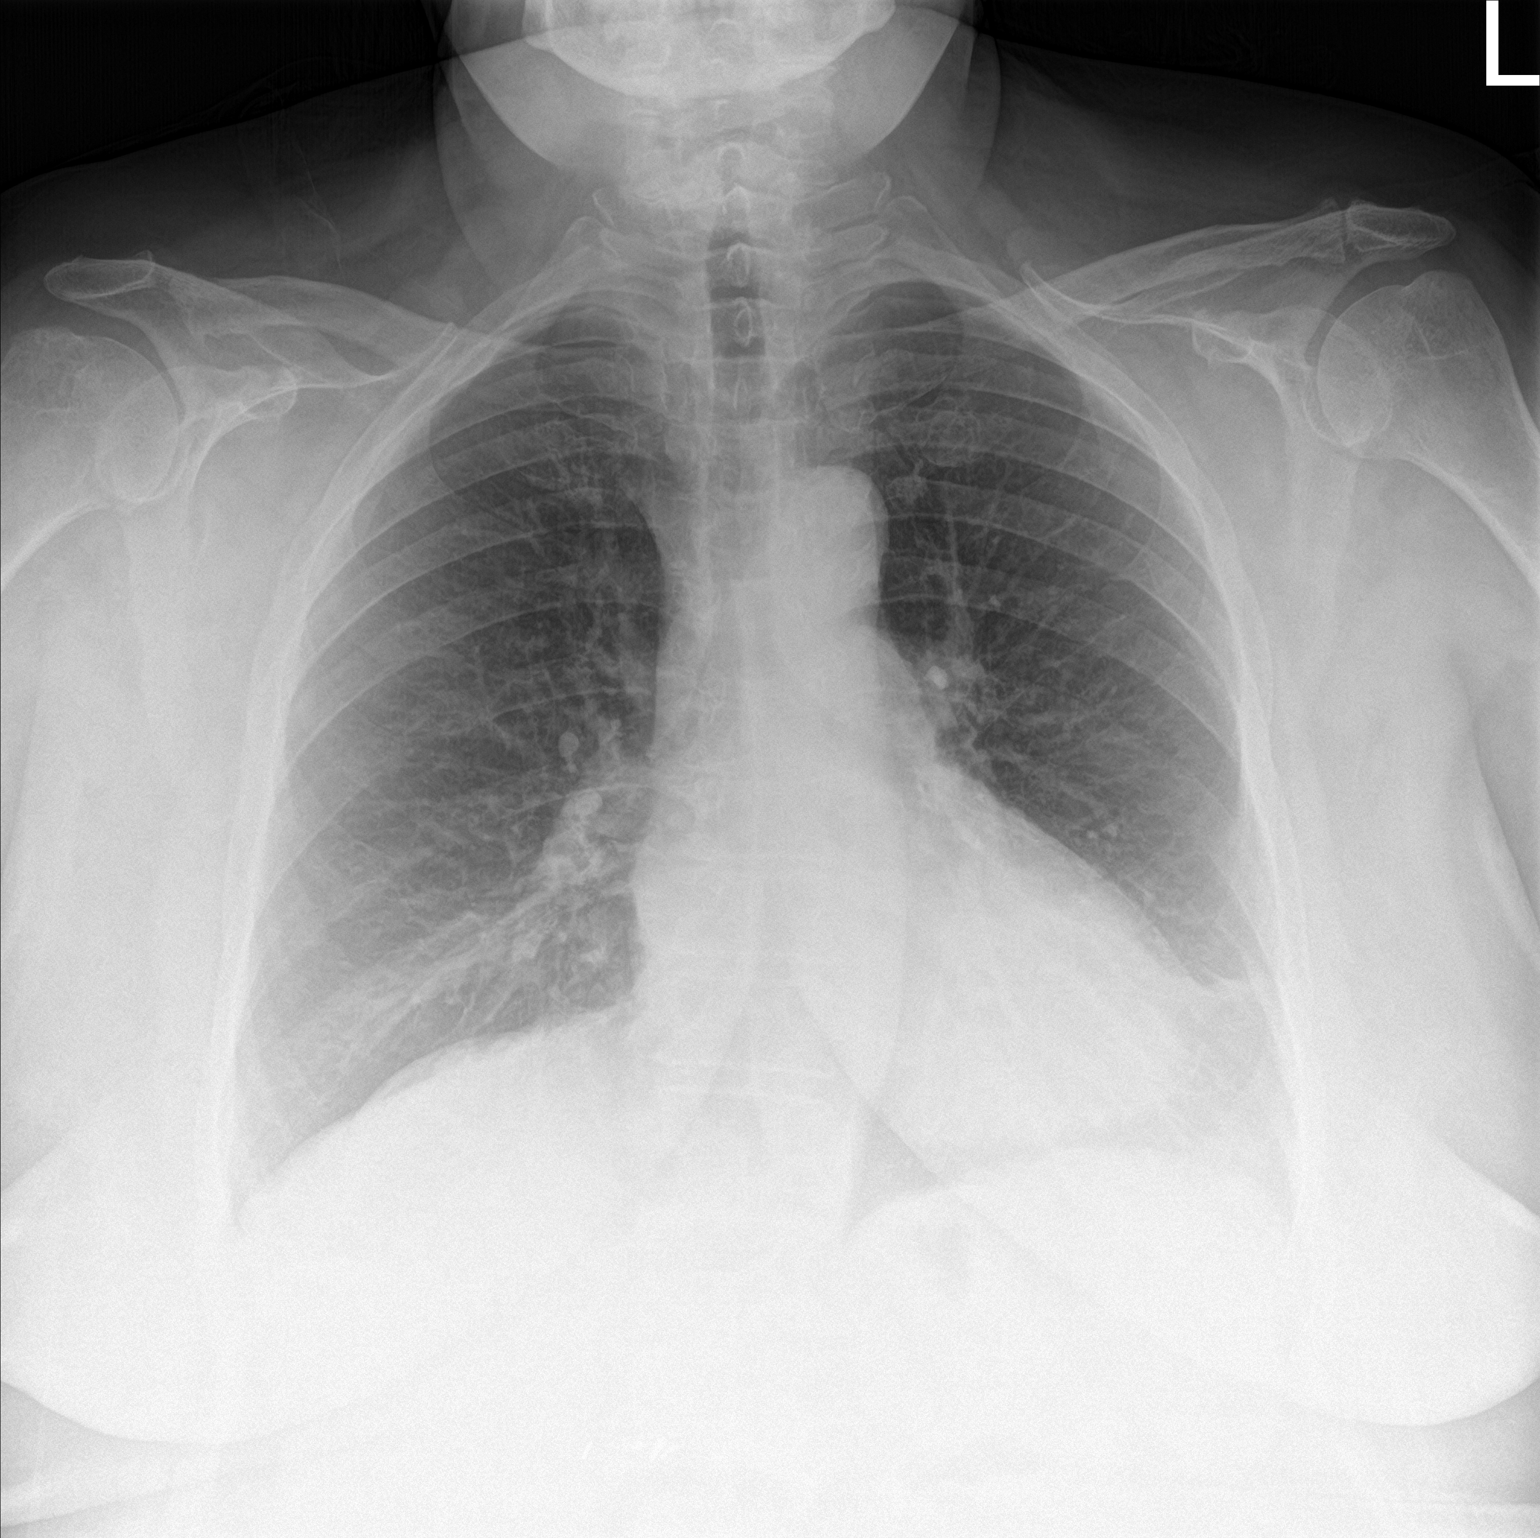

[chest lat]
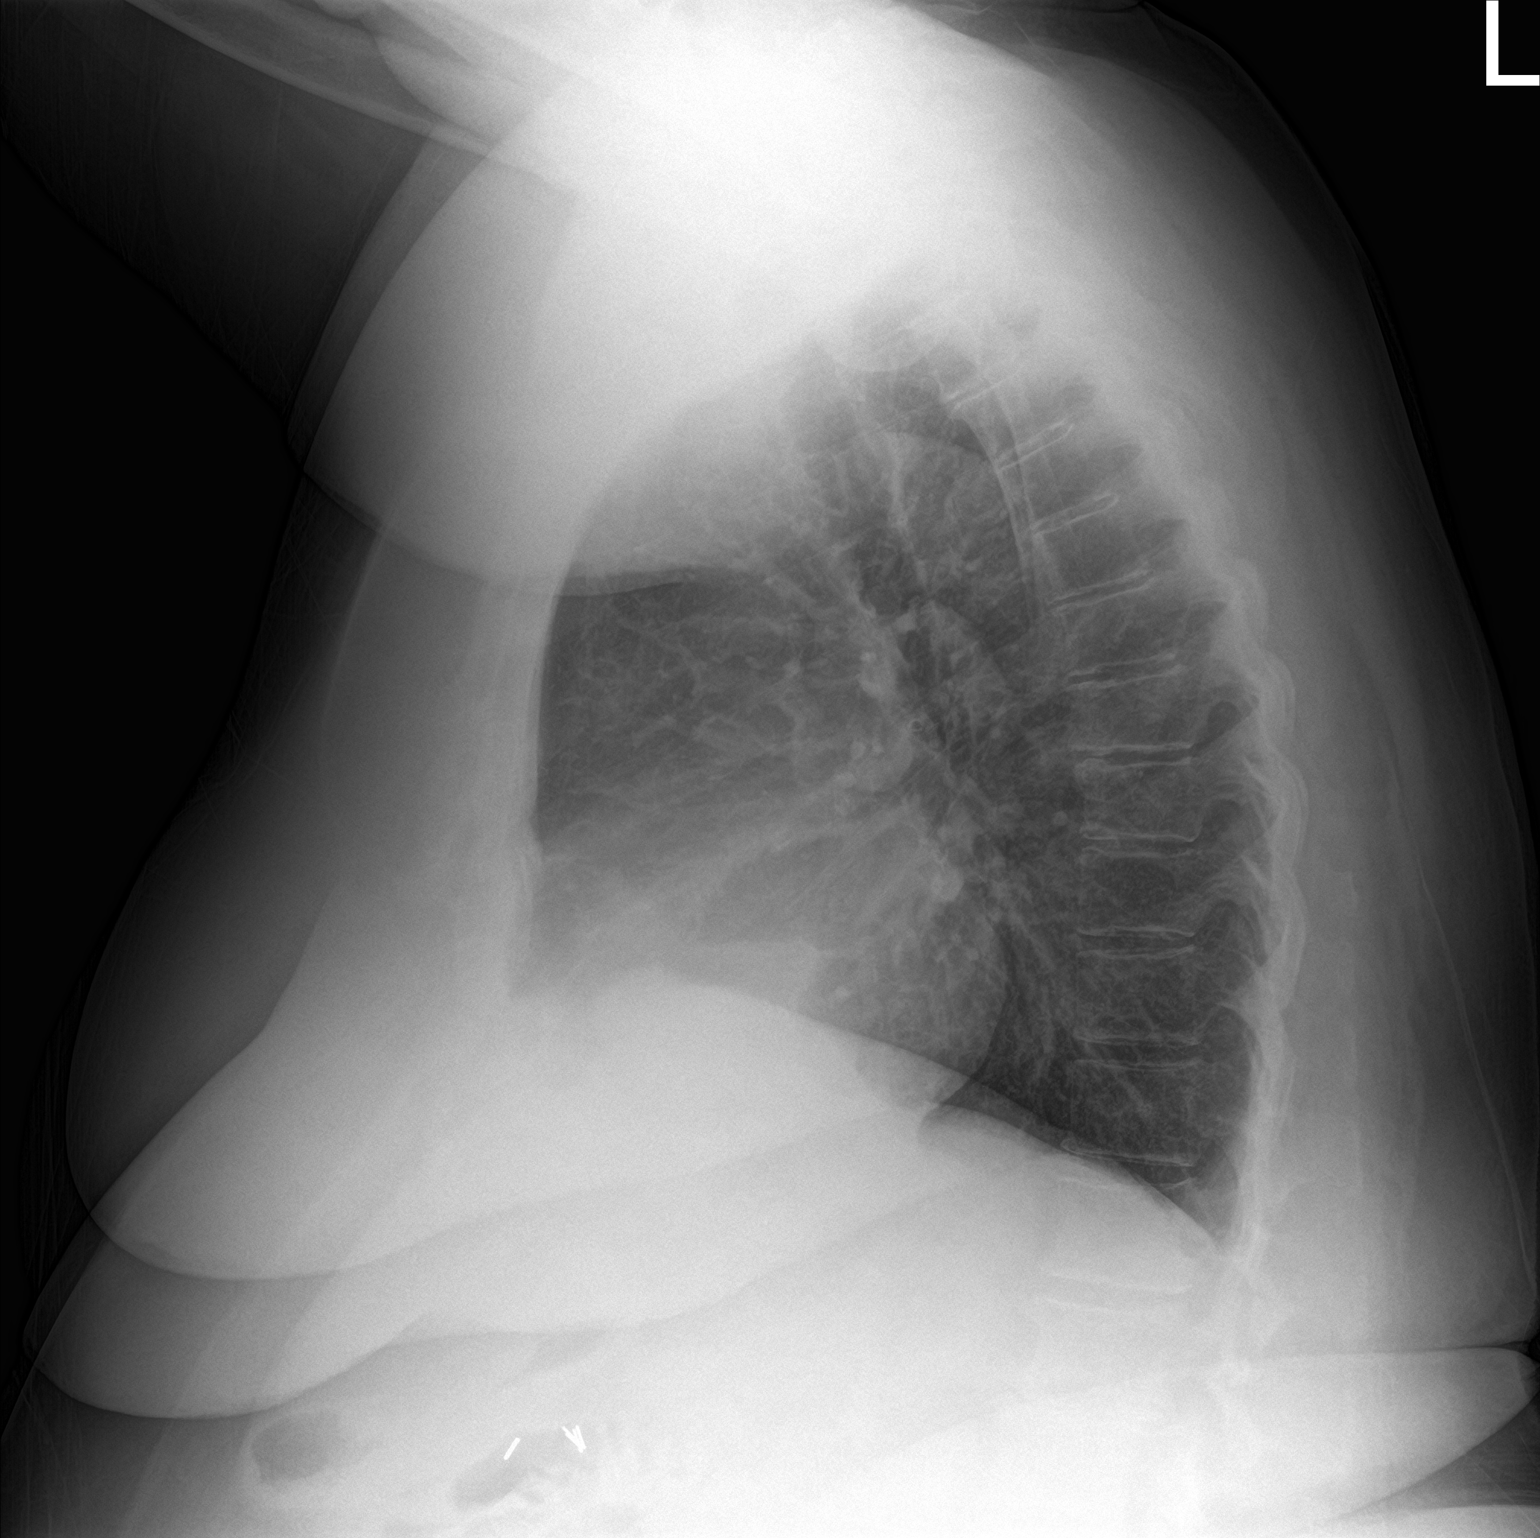

[2 of 2 positions shown; findings below may reference images not displayed]

FINDINGS: No pneumothorax. Mild atelectasis in the left lung base. No focal
infiltrate. No other interval changes or acute abnormalities.
IMPRESSION: No active cardiopulmonary disease.

## 2018-03-07 ENCOUNTER — Encounter: Payer: Self-pay | Admitting: *Deleted

## 2018-03-07 ENCOUNTER — Ambulatory Visit (INDEPENDENT_AMBULATORY_CARE_PROVIDER_SITE_OTHER): Payer: Medicare Other | Admitting: *Deleted

## 2018-03-07 VITALS — BP 144/71 | HR 69 | Ht 60.0 in | Wt 269.2 lb

## 2018-03-07 DIAGNOSIS — Z0000 Encounter for general adult medical examination without abnormal findings: Secondary | ICD-10-CM | POA: Diagnosis not present

## 2018-03-07 NOTE — Progress Notes (Addendum)
Subjective:   Nicole Mccann is a 72 y.o. female who presents for Medicare Annual (Subsequent) preventive examination.  Review of Systems: No ROS.  Medicare Wellness Visit. Additional risk factors are reflected in the social history.  Cardiac Risk Factors include: advanced age (>36men, >41 women);diabetes mellitus;sedentary lifestyle;obesity (BMI >30kg/m2) Sleep patterns: wakes 2x to urinate. Wears CPAP.7-8 hrs. Home Safety/Smoke Alarms: Feels safe in home. Smoke alarms in place.  Living environment; residence and Firearm Safety: Split level home. Lives with husband. Grab bars in walk in shower.   Female:      Mammo-  utd     Dexa scan- utd       CCS- declines    Objective:     Vitals: BP (!) 144/71 (BP Location: Left Wrist, Patient Position: Sitting, Cuff Size: Normal)   Pulse 69   Ht 5' (1.524 m)   Wt 269 lb 3.2 oz (122.1 kg)   SpO2 97%   BMI 52.57 kg/m   Body mass index is 52.57 kg/m.  Advanced Directives 03/07/2018 11/09/2017 01/03/2017 12/17/2016 06/08/2016 06/07/2016 07/27/2015  Does Patient Have a Medical Advance Directive? Yes Yes Yes No Yes Yes No  Type of Estate agent of [place];Living will Living will Living will;Healthcare Power of [address] - Healthcare Power of [place];Living will Healthcare Power of [place];Living will -  Does patient want to make changes to medical advance directive? No - Patient declined - No - Patient declined - - - -  Copy of Healthcare Power of Attorney in Chart? No - copy requested - No - copy requested - - - -  Would patient like information on creating a medical advance directive? - - - - - - No - patient declined information  Pre-existing out of facility DNR order (yellow form or pink MOST form) - - - - - - -    Tobacco Social History   Tobacco Use  Smoking Status Never Smoker  Smokeless Tobacco Never Used     Counseling given: Not Answered   Clinical Intake:     Pain : No/denies pain                 Past Medical History:  Diagnosis Date  . Allergy   . Anxiety    Dr Madaline Guthrie  . Asthma   . Atypical seizure (HCC) 06/07/2012  . Depression    Dr Madaline Guthrie  . Diabetes mellitus   . DVT (deep venous thrombosis) (HCC)   . Headache(784.0)   . Hyperlipidemia   . Hypersomnia with sleep apnea, unspecified 05/22/2014  . Olfactory aura   . OSA on CPAP   . Shingles 07/2013   Past Surgical History:  Procedure Laterality Date  . APPENDECTOMY    . BREAST EXCISIONAL BIOPSY Right 1998  . CHOLECYSTECTOMY    . COLONOSCOPY  2006   [facility]  . rotator cuff surgery    . TONSILLECTOMY      Family History  Problem Relation Age of Onset  . Stomach cancer Father   . Coronary artery disease Father   . Breast cancer Sister 85       breast cancer  . Diabetes Maternal Aunt   . Diabetes Paternal Aunt   . Prostate cancer Brother   . Cancer Daughter   . Stroke Neg Hx    Social History   Socioeconomic History  . Marital status: Married    Spouse name: Lars Mage  . Number of children: 2  . Years of education: College  .  Highest education level: Not on file  Occupational History  . Not on file  Social Needs  . Financial resource strain: Not on file  . Food insecurity:    Worry: Not on file    Inability: Not on file  . Transportation needs:    Medical: Not on file    Non-medical: Not on file  Tobacco Use  . Smoking status: Never Smoker  . Smokeless tobacco: Never Used  Substance and Sexual Activity  . Alcohol use: Yes    Alcohol/week: 1.8 oz    Types: 3 Glasses of wine per week    Comment: 1/2 glass wine daily  . Drug use: No  . Sexual activity: Never  Lifestyle  . Physical activity:    Days per week: Not on file    Minutes per session: Not on file  . Stress: Not on file  Relationships  . Social connections:    Talks on phone: Not on file    Gets together: Not on file    Attends religious service: Not on file    Active member of club or organization: Not on file    Attends  meetings of clubs or organizations: Not on file    Relationship status: Not on file  Other Topics Concern  . Not on file  Social History Narrative   Patient is married Lars Mage) and lives at home with her husband.   Patient has two adult children.   Patient is retired.   Patient has a college education.   Patient is right-handed.   Patient drinks one cup of tea daily.    Outpatient Encounter Medications as of 03/07/2018  Medication Sig  . glucose blood test strip Use as instructed 2x a day - One Touch Verio  . OVER THE COUNTER MEDICATION Lidocaine 4% pain relieving gel-patch. Apply one patch to affected area no more than 3 to 4 times daily  . furosemide (LASIX) 20 MG tablet Take 1 tablet (20 mg total) by mouth daily. Use as needed for swelling of your legs (Patient not taking: Reported on 03/07/2018)  . metFORMIN (GLUCOPHAGE-XR) 750 MG 24 hr tablet 1 tablet daily. Taken if CBG is >120 two days in a row   No facility-administered encounter medications on file as of 03/07/2018.     Activities of Daily Living In your present state of health, do you have any difficulty performing the following activities: 03/07/2018  Hearing? N  Vision? N  Comment wears reading glasses. Eye doctor yearly.  Difficulty concentrating or making decisions? N  Walking or climbing stairs? Y  Comment uses walker  Dressing or bathing? N  Doing errands, shopping? Y  Comment Husband Insurance claims handler and eating ? N  Using the Toilet? N  In the past six months, have you accidently leaked urine? Y  Comment wears pads  Do you have problems with loss of bowel control? N  Managing your Medications? Y  Managing your Finances? N  Housekeeping or managing your Housekeeping? Y  Some recent data might be hidden    Patient Care Team: Copland, Gwenlyn Found, MD as PCP - General (Family Medicine)    Assessment:   This is a routine wellness examination for [place]. Physical assessment deferred to PCP.   Exercise  Activities and Dietary recommendations Current Exercise Habits: Home exercise routine, Type of exercise: walking, Time (Minutes): 10, Frequency (Times/Week): 7, Weekly Exercise (Minutes/Week): 70, Intensity: Mild Diet (meal preparation, eat out, water intake, caffeinated beverages, dairy products, fruits and  vegetables): in general, a "healthy" diet  , well balanced. Needs to drink more water.      Goals    . Increase physical activity    . Weight (lb) < 250 lb (113.4 kg) (pt-stated)       Fall Risk Fall Risk  03/07/2018 01/03/2017 11/26/2016 05/05/2016 10/22/2015  Falls in the past year? No Yes No No Yes  Number falls in past yr: - 2 or more - - 1  Injury with Fall? - No - - No  Risk for fall due to : - Impaired balance/gait Other (Comment) - -  Follow up - Education provided;Falls prevention discussed - - -     Depression Screen PHQ 2/9 Scores 03/07/2018 01/03/2017 11/26/2016 05/05/2016  PHQ - 2 Score 0 0 0 0  PHQ- 9 Score - - - -  Exception Documentation - - Other- indicate reason in comment box -     Cognitive Function Ad8 score reviewed for issues:  Issues making decisions:no  Less interest in hobbies / activities:no  Repeats questions, stories (family complaining):no  Trouble using ordinary gadgets (microwave, computer, phone):no  Forgets the month or year: no  Mismanaging finances: no  Remembering appts:no  Daily problems with thinking and/or memory:no Ad8 score is=0    MMSE - Mini Mental State Exam 01/03/2017  Orientation to time 5  Orientation to Place 5  Registration 3  Attention/ Calculation 5  Recall 3  Language- name 2 objects 2  Language- repeat 1  Language- follow 3 step command 3  Language- read & follow direction 1  Write a sentence 1  Copy design 1  Total score 30   Montreal Cognitive Assessment  06/03/2016  Visuospatial/ Executive (0/5) 5  Naming (0/3) 3  Attention: Read list of digits (0/2) 2  Attention: Read list of letters (0/1) 1    Attention: Serial 7 subtraction starting at 100 (0/3) 3  Language: Repeat phrase (0/2) 1  Language : Fluency (0/1) 0  Abstraction (0/2) 2  Delayed Recall (0/5) 4  Orientation (0/6) 5  Total 26  Adjusted Score (based on education) 26      Immunization History  Administered Date(s) Administered  . Influenza Split 07/25/2012  . Influenza Whole 09/27/2007, 08/21/2008, 09/17/2009  . Influenza,inj,Quad PF,6+ Mos 07/23/2015, 08/08/2016  . Pneumococcal Conjugate-13 03/13/2015  . Td 11/08/2001  . Tdap 08/17/2013    Screening Tests Health Maintenance  Topic Date Due  . COLONOSCOPY  12/23/1993  . PNA vac Low Risk Adult (2 of 2 - PPSV23) 03/12/2016  . OPHTHALMOLOGY EXAM  01/03/2017  . FOOT EXAM  05/05/2017  . INFLUENZA VACCINE  06/08/2018  . HEMOGLOBIN A1C  06/11/2018  . MAMMOGRAM  05/05/2019  . TETANUS/TDAP  08/18/2023  . DEXA SCAN  Completed  . Hepatitis C Screening  Completed      Plan:   Follow up with Dr.Copland as directed.  Eat heart healthy diet (full of fruits, vegetables, whole grains, lean protein, water--limit salt, fat, and sugar intake) and increase physical activity as tolerated.  Continue doing brain stimulating activities (puzzles, reading, adult coloring books, staying active) to keep memory sharp.   Please schedule appointment to follow up with me in 1 yr  I have personally reviewed and noted the following in the patient's chart:   . Medical and social history . Use of alcohol, tobacco or illicit drugs  . Current medications and supplements . Functional ability and status . Nutritional status . Physical activity . Advanced directives . List  of other physicians . Hospitalizations, surgeries, and ER visits in previous 12 months . Vitals . Screenings to include cognitive, depression, and falls . Referrals and appointments  In addition, I have reviewed and discussed with patient certain preventive protocols, quality metrics, and best practice  recommendations. A written personalized care plan for preventive services as well as general preventive health recommendations were provided to patient.     Avon Gully, RN  03/07/2018  I have reviewed the above MWE by Ms. Moshe Cipro and agree with her documentationArthor Captain MD

## 2018-03-07 NOTE — Patient Instructions (Signed)
Eat heart healthy diet (full of fruits, vegetables, whole grains, lean protein, water--limit salt, fat, and sugar intake) and increase physical activity as tolerated.  Continue doing brain stimulating activities (puzzles, reading, adult coloring books, staying active) to keep memory sharp.   Please schedule appointment to follow up with me in 1 yr    Nicole Mccann , Thank you for taking time to come for your Medicare Wellness Visit. I appreciate your ongoing commitment to your health goals. Please review the following plan we discussed and let me know if I can assist you in the future.   These are the goals we discussed: Goals    . Increase physical activity    . Weight (lb) < 250 lb (113.4 kg) (pt-stated)       This is a list of the screening recommended for you and due dates:  Health Maintenance  Topic Date Due  . Colon Cancer Screening  12/23/1993  . Pneumonia vaccines (2 of 2 - PPSV23) 03/12/2016  . Eye exam for diabetics  01/03/2017  . Complete foot exam   05/05/2017  . Flu Shot  06/08/2018  . Hemoglobin A1C  06/11/2018  . Mammogram  05/05/2019  . Tetanus Vaccine  08/18/2023  . DEXA scan (bone density measurement)  Completed  .  Hepatitis C: One time screening is recommended by Center for Disease Control  (CDC) for  adults born from [birth year] through 1967.   Completed     Health Maintenance for Postmenopausal Women Menopause is a normal process in which your reproductive ability comes to an end. This process happens gradually over a span of months to years, usually between the ages of 77 and 19. Menopause is complete when you have missed 12 consecutive menstrual periods. It is important to talk with your health care provider about some of the most common conditions that affect postmenopausal women, such as heart disease, cancer, and bone loss (osteoporosis). Adopting a healthy lifestyle and getting preventive care can help to promote your health and wellness. Those actions can also  lower your chances of developing some of these common conditions. What should I know about menopause? During menopause, you may experience a number of symptoms, such as:  Moderate-to-severe hot flashes.  Night sweats.  Decrease in sex drive.  Mood swings.  Headaches.  Tiredness.  Irritability.  Memory problems.  Insomnia.  Choosing to treat or not to treat menopausal changes is an individual decision that you make with your health care provider. What should I know about hormone replacement therapy and supplements? Hormone therapy products are effective for treating symptoms that are associated with menopause, such as hot flashes and night sweats. Hormone replacement carries certain risks, especially as you become older. If you are thinking about using estrogen or estrogen with progestin treatments, discuss the benefits and risks with your health care provider. What should I know about heart disease and stroke? Heart disease, heart attack, and stroke become more likely as you age. This may be due, in part, to the hormonal changes that your body experiences during menopause. These can affect how your body processes dietary fats, triglycerides, and cholesterol. Heart attack and stroke are both medical emergencies. There are many things that you can do to help prevent heart disease and stroke:  Have your blood pressure checked at least every 1-2 years. High blood pressure causes heart disease and increases the risk of stroke.  If you are 51-67 years old, ask your health care provider if you should take aspirin  to prevent a heart attack or a stroke.  Do not use any tobacco products, including cigarettes, chewing tobacco, or electronic cigarettes. If you need help quitting, ask your health care provider.  It is important to eat a healthy diet and maintain a healthy weight. ? Be sure to include plenty of vegetables, fruits, low-fat dairy products, and lean protein. ? Avoid eating foods  that are high in solid fats, added sugars, or salt (sodium).  Get regular exercise. This is one of the most important things that you can do for your health. ? Try to exercise for at least 150 minutes each week. The type of exercise that you do should increase your heart rate and make you sweat. This is known as moderate-intensity exercise. ? Try to do strengthening exercises at least twice each week. Do these in addition to the moderate-intensity exercise.  Know your numbers.Ask your health care provider to check your cholesterol and your blood glucose. Continue to have your blood tested as directed by your health care provider.  What should I know about cancer screening? There are several types of cancer. Take the following steps to reduce your risk and to catch any cancer development as early as possible. Breast Cancer  Practice breast self-awareness. ? This means understanding how your breasts normally appear and feel. ? It also means doing regular breast self-exams. Let your health care provider know about any changes, no matter how small.  If you are 14 or older, have a clinician do a breast exam (clinical breast exam or CBE) every year. Depending on your age, family history, and medical history, it may be recommended that you also have a yearly breast X-ray (mammogram).  If you have a family history of breast cancer, talk with your health care provider about genetic screening.  If you are at high risk for breast cancer, talk with your health care provider about having an MRI and a mammogram every year.  Breast cancer (BRCA) gene test is recommended for women who have family members with BRCA-related cancers. Results of the assessment will determine the need for genetic counseling and BRCA1 and for BRCA2 testing. BRCA-related cancers include these types: ? Breast. This occurs in males or females. ? Ovarian. ? Tubal. This may also be called fallopian tube cancer. ? Cancer of the  abdominal or pelvic lining (peritoneal cancer). ? Prostate. ? Pancreatic.  Cervical, Uterine, and Ovarian Cancer Your health care provider may recommend that you be screened regularly for cancer of the pelvic organs. These include your ovaries, uterus, and vagina. This screening involves a pelvic exam, which includes checking for microscopic changes to the surface of your cervix (Pap test).  For women ages 21-65, health care providers may recommend a pelvic exam and a Pap test every three years. For women ages 87-65, they may recommend the Pap test and pelvic exam, combined with testing for human papilloma virus (HPV), every five years. Some types of HPV increase your risk of cervical cancer. Testing for HPV may also be done on women of any age who have unclear Pap test results.  Other health care providers may not recommend any screening for nonpregnant women who are considered low risk for pelvic cancer and have no symptoms. Ask your health care provider if a screening pelvic exam is right for you.  If you have had past treatment for cervical cancer or a condition that could lead to cancer, you need Pap tests and screening for cancer for at least 20 years  after your treatment. If Pap tests have been discontinued for you, your risk factors (such as having a new sexual partner) need to be reassessed to determine if you should start having screenings again. Some women have medical problems that increase the chance of getting cervical cancer. In these cases, your health care provider may recommend that you have screening and Pap tests more often.  If you have a family history of uterine cancer or ovarian cancer, talk with your health care provider about genetic screening.  If you have vaginal bleeding after reaching menopause, tell your health care provider.  There are currently no reliable tests available to screen for ovarian cancer.  Lung Cancer Lung cancer screening is recommended for adults  33-47 years old who are at high risk for lung cancer because of a history of smoking. A yearly low-dose CT scan of the lungs is recommended if you:  Currently smoke.  Have a history of at least 30 pack-years of smoking and you currently smoke or have quit within the past 15 years. A pack-year is smoking an average of one pack of cigarettes per day for one year.  Yearly screening should:  Continue until it has been 15 years since you quit.  Stop if you develop a health problem that would prevent you from having lung cancer treatment.  Colorectal Cancer  This type of cancer can be detected and can often be prevented.  Routine colorectal cancer screening usually begins at age 60 and continues through age 62.  If you have risk factors for colon cancer, your health care provider may recommend that you be screened at an earlier age.  If you have a family history of colorectal cancer, talk with your health care provider about genetic screening.  Your health care provider may also recommend using home test kits to check for hidden blood in your stool.  A small camera at the end of a tube can be used to examine your colon directly (sigmoidoscopy or colonoscopy). This is done to check for the earliest forms of colorectal cancer.  Direct examination of the colon should be repeated every 5-10 years until age 75. However, if early forms of precancerous polyps or small growths are found or if you have a family history or genetic risk for colorectal cancer, you may need to be screened more often.  Skin Cancer  Check your skin from head to toe regularly.  Monitor any moles. Be sure to tell your health care provider: ? About any new moles or changes in moles, especially if there is a change in a mole's shape or color. ? If you have a mole that is larger than the size of a pencil eraser.  If any of your family members has a history of skin cancer, especially at a young age, talk with your health  care provider about genetic screening.  Always use sunscreen. Apply sunscreen liberally and repeatedly throughout the day.  Whenever you are outside, protect yourself by wearing long sleeves, pants, a wide-brimmed hat, and sunglasses.  What should I know about osteoporosis? Osteoporosis is a condition in which bone destruction happens more quickly than new bone creation. After menopause, you may be at an increased risk for osteoporosis. To help prevent osteoporosis or the bone fractures that can happen because of osteoporosis, the following is recommended:  If you are 7-49 years old, get at least 1,000 mg of calcium and at least 600 mg of vitamin D per day.  If you are older  than age 65 but younger than age 62, get at least 1,200 mg of calcium and at least 600 mg of vitamin D per day.  If you are older than age 50, get at least 1,200 mg of calcium and at least 800 mg of vitamin D per day.  Smoking and excessive alcohol intake increase the risk of osteoporosis. Eat foods that are rich in calcium and vitamin D, and do weight-bearing exercises several times each week as directed by your health care provider. What should I know about how menopause affects my mental health? Depression may occur at any age, but it is more common as you become older. Common symptoms of depression include:  Low or sad mood.  Changes in sleep patterns.  Changes in appetite or eating patterns.  Feeling an overall lack of motivation or enjoyment of activities that you previously enjoyed.  Frequent crying spells.  Talk with your health care provider if you think that you are experiencing depression. What should I know about immunizations? It is important that you get and maintain your immunizations. These include:  Tetanus, diphtheria, and pertussis (Tdap) booster vaccine.  Influenza every year before the flu season begins.  Pneumonia vaccine.  Shingles vaccine.  Your health care provider may also  recommend other immunizations. This information is not intended to replace advice given to you by your health care provider. Make sure you discuss any questions you have with your health care provider. Document Released: 12/17/2005 Document Revised: 05/14/2016 Document Reviewed: 07/29/2015 Elsevier Interactive Patient Education  2018 [employer].

## 2018-04-11 ENCOUNTER — Telehealth: Payer: Self-pay | Admitting: Neurology

## 2018-04-11 ENCOUNTER — Ambulatory Visit: Payer: Self-pay

## 2018-04-11 NOTE — Telephone Encounter (Signed)
Patient called in with c/o "headache." She says "it started on Thursday. Friday and Saturday I was in the bed, I could barely walk it hurt so bad. The pain was sharp and up to a 10. Today, it's not that bad, but the pain is still there. It is mainly to the left side of my head, but sometimes goes to the right and on both sides. The pain lasts for about 5 minutes, then goes away." I ask about other symptoms, she says "I was dizzy when I had the pain Friday and Saturday, but not so much now." She says her BP was 132/62, P 64 and 121/52 P 66. According to protocol, see PCP within 24 hours, appointment scheduled for tomorrow at 1530 with Dr. Patsy Lager, care advice given, patient verbalized understanding.  Reason for Disposition . [1] New headache AND [2] age > 13  Answer Assessment - Initial Assessment Questions 1. LOCATION: "Where does it hurt?"      Left side of the head mostly; sometimes goes to both sides 2. ONSET: "When did the headache start?" (Minutes, hours or days)      Thursday 3. PATTERN: "Does the pain come and go, or has it been constant since it started?"     Come and go; last 4-5 minutes, then goes away 4. SEVERITY: "How bad is the pain?" and "What does it keep you from doing?"  (e.g., Scale 1-10; mild, moderate, or severe)   - MILD (1-3): doesn't interfere with normal activities    - MODERATE (4-7): interferes with normal activities or awakens from sleep    - SEVERE (8-10): excruciating pain, unable to do any normal activities        Goes to 10, sharp 5. RECURRENT SYMPTOM: "Have you ever had headaches before?" If so, ask: "When was the last time?" and "What happened that time?"      Yes, but not this painful 6. CAUSE: "What do you think is causing the headache?"     I don't know 7. MIGRAINE: "Have you been diagnosed with migraine headaches?" If so, ask: "Is this headache similar?"      No 8. HEAD INJURY: "Has there been any recent injury to the head?"      No 9. OTHER SYMPTOMS: "Do  you have any other symptoms?" (fever, stiff neck, eye pain, sore throat, cold symptoms)     Dizziness when have the pain 10. PREGNANCY: "Is there any chance you are pregnant?" "When was your last menstrual period?"       No  Protocols used: HEADACHE-A-AH

## 2018-04-11 NOTE — Telephone Encounter (Signed)
Pt called she is having pain in the left side of the head, warm feeling left side of the neck down to the upper left arm. She said this has been going on for the past 4-5 days. Husband said he has been taking her BP and it has been fluctuating. Pt said she is afraid. I advised her to go to the ED but she insists on wanting to see Dr D today. Rn was skyped and I relayed her msg to the pt: it was recommended the pt go to the ED, pt could see Eber Jones tomorrow as Dr D would be here also. The pt did not want to do that either. The pt did not want to see PCP as they feel she will be referred for tests and then need to see Dr D.  The pt and her husband again insists on wanting to see Dr D only. I have advised them the RN will give them a call back, they understood and was very appreciative.

## 2018-04-11 NOTE — Telephone Encounter (Signed)
I have called Nicole Mccann back and informed Nicole patients Mccann that unfortunately Nicole Mccann can't see her today but she is offering to create a opening tomorrow afternoon for Nicole Mccann at 4:30 with a check in of 4 pm. Nicole Mccann in Nicole meantime had already called Nicole PCP and was worked in for apt tomorrow as well. I informed them that since Nicole issue is something she has seen them before in Nicole past for and sine she is willing to see them tomorrow he should call back and make Nicole PCP aware that we were able to get him worked in here. Pt verbalized understanding and will cancel PCP apt for tomorrow and will check in for her apt at 4 pm tomorrow.

## 2018-04-12 ENCOUNTER — Ambulatory Visit: Payer: Medicare Other | Admitting: Family Medicine

## 2018-04-12 ENCOUNTER — Telehealth: Payer: Self-pay

## 2018-04-12 ENCOUNTER — Encounter: Payer: Self-pay | Admitting: Neurology

## 2018-04-12 ENCOUNTER — Ambulatory Visit (INDEPENDENT_AMBULATORY_CARE_PROVIDER_SITE_OTHER): Payer: Medicare Other | Admitting: Neurology

## 2018-04-12 VITALS — BP 142/71 | HR 66 | Ht 60.0 in | Wt 267.5 lb

## 2018-04-12 DIAGNOSIS — M316 Other giant cell arteritis: Secondary | ICD-10-CM

## 2018-04-12 MED ORDER — PREDNISONE 20 MG PO TABS: 20.0000 mg | ORAL_TABLET | Freq: Every day | ORAL | 0 refills | Status: DC

## 2018-04-12 NOTE — Patient Instructions (Addendum)
Temporal Arteritis Temporal arteritis, also called giant cell arteritis, is a condition that causes arteries to become swollen (inflamed). It usually affects arteries in your head and face, but arteries in any part of the body can become inflamed. Temporal arteritis can cause serious problems, such as bone loss, diabetes, and blindness. What are the causes? The cause is unknown. What increases the risk?  Being older than 50.  Being a woman.  Being Caucasian.  Being of [place], [place], [place], [place], or [place] ancestry.  Having polymyalgia rheumatica (PMR). What are the signs or symptoms? Some people with temporal arteritis have just one symptom, while other have several symptoms. Most signs and symptoms are related to the head and face. Signs and symptoms may include:  Hard or swollen temples (common). Your temples are the flattened area on either side of your forehead. If your temples are swollen, it may hurt to touch them.  Pain when combing your hair or when laying your head down.  Pain in the jaw when chewing.  Pain in the throat or tongue.  Problems with your vision, such as sudden loss of vision in one eye, or seeing double.  Fever.  Fatigue.  A dry cough.  Pain in the hips and shoulders.  Pain in the arms during exercise.  Depression.  Weight loss.  How is this diagnosed? Your health care provider will ask about your symptoms and do a physical exam. He or she may also perform an eye exam and tests, such as:  A complete blood count.  An erythrocyte sedimentation rate test, also called the sed rate test.  A C-reactive protein (CRP) test.  A tissue sample (biopsy) test.  How is this treated? Temporal arteritis is treated with a type of medicine called a corticosteroid. Vision problems may be treated with additional medicines. You will need to see your health care provider while you are being treated. During follow-up visits, your health care provider  will check for problems by:  Performing blood tests and bone density tests.  Checking your blood pressure and blood sugar.  Follow these instructions at home:  Take medicines only as directed by your health care provider.  Take any vitamins or supplements that your health care provider suggests. These may include vitamin D and calcium, which help keep your bones from becoming weak.  Exercise. Talk with your health care provider about what exercises are okay for you to do. Usually exercises that increase your heart rate (aerobic exercise), such as walking, are recommended. Aerobic exercise helps control your blood pressure and prevent bone loss.  Follow a healthy diet. Include healthy sources of protein, fruits, vegetables, and whole grains in your diet. Following a healthy diet helps prevent bone damage and diabetes. Contact a health care provider if:  Your symptoms get worse.  Your fever, fatigue, headache, weight loss, or pain in your jaw gets worse.  You develop signs of infection, such as fever, swelling, redness, warmth, and tenderness. Get help right away if:  Your vision gets worse.  Your pain does not go away, even after you take pain medicine.  You have chest pain.  You have trouble breathing.  One side of your face or body suddenly becomes weak or numb. This information is not intended to replace advice given to you by your health care provider. Make sure you discuss any questions you have with your health care provider. Document Released: 08/22/2009 Document Revised: 06/24/2016 Document Reviewed: 12/19/2013 Elsevier Interactive Patient Education  [employer].  Arteritis temporal (Temporal Arteritis) La arteritis temporal, tambin llamada arteritis de clulas gigantes, es una afeccin que hace que las arterias se hinchen (inflamen). Por lo general, afecta las arterias de la cabeza y la cara, pero pueden inflamarse las arterias de cualquier parte del cuerpo. La  arteritis temporal puede causar problemas graves, como prdida sea, diabetes y ceguera. CAUSAS La causa es desconocida. FACTORES DE RIESGO  Ser mayor de 61aos.  Ser mujer.  Ser de Engineer, manufacturing.  Tener ascendencia danesa, suiza, finlandesa, noruega o islandesa.  Tener polimialgia reumtica (PMR). SIGNOS Y SNTOMAS Algunas personas con arteritis temporal tienen un solo sntoma, mientras que otras tienen varios sntomas. La mayora de los signos y los sntomas se relacionan con la cabeza y la cara. Entre los signos y sntomas se incluyen los siguientes:  Sienes duras o hinchadas (frecuente). Las sienes son las reas aplanadas que estn a ambos lados de la frente. Si las sienes estn hinchadas, puede sentir dolor al tocarlas.  Dolor al peinarse o al apoyar la cabeza al recostarse.  Dolor mandibular al [employer].  Dolor de Engineer, civil (consulting).  Problemas de visin, por ejemplo, prdida repentina de la visin en un ojo o visin doble.  Grant Ruts.  Fatiga.  Tos seca.  Dolor en las caderas o los hombros.  Dolor en los brazos al hacer ejercicio.  Depresin.  Prdida de peso. DIAGNSTICO El mdico le preguntar acerca de sus sntomas y le har un examen fsico. Tambin podr hacerle estudios y un examen ocular, por ejemplo:  Un hemograma completo.  Un estudio de la velocidad de eritrosedimentacin, tambin llamado anlisis de la tasa de sedimentacin.  Una prueba de protena C-reactiva (PCR).  Una prueba de [place] de tejido (biopsia). TRATAMIENTO La arteritis temporal se trata con un tipo de medicamento llamado corticoide. Los problemas de visin pueden tratarse con otros medicamentos. Deber ver al mdico mientras reciba tratamiento. Durante las visitas de control, el mdico har lo siguiente para Engineer, manufacturing problemas:  Indicar anlisis de [place] de densidad sea.  Verificar su presin arterial y Engineer, agricultural. INSTRUCCIONES PARA EL  CUIDADO EN EL HOGAR  Tome los medicamentos solamente como se lo haya indicado el mdico.  Tome las vitaminas o los suplementos recomendados por el mdico. Estos pueden incluir vitaminaD y calcio, que ayudan a Automotive engineer que los huesos se debiliten.  Haga actividad fsica. Consulte al [employer] tipos de ejercicios convenientes para usted. Por lo general, se recomiendan los ejercicios que aumentan la frecuencia cardaca (ejercicio aerbico), Corporate treasurer. El ejercicio aerbico ayuda a Chief Operating Officer la presin arterial y a Research scientist (medical) prdida sea.  Siga una dieta saludable. Incluya en la dieta fuentes saludables de protenas, frutas, vegetales y cereales integrales. El hecho de llevar una dieta saludable ayudar a evitar el dao seo y la diabetes. SOLICITE ATENCIN MDICA SI:  Los sntomas empeoran.  La fiebre, la fatiga, el dolor de Moore, la prdida de peso o el dolor de [place].  Tiene signos de infeccin, como fiebre, hinchazn, enrojecimiento, calor y dolor con la palpacin. SOLICITE ATENCIN MDICA DE INMEDIATO SI:  La visin empeora.  El dolor no desaparece, incluso despus de tomar analgsicos.  Siente dolor en el pecho.  Tiene dificultad para respirar.  Repentinamente, siente adormecimiento o debilidad en un lado del rostro o del cuerpo. ASEGRESE DE QUE:  Comprende estas instrucciones.  Controlar su afeccin.  Recibir ayuda de inmediato si no mejora o si empeora. Esta informacin no  tiene Theme park manager el consejo del mdico. Asegrese de hacerle al mdico cualquier pregunta que tenga. Document Released: 08/15/2013 Document Revised: 11/15/2014 Document Reviewed: 12/19/2013 Elsevier Interactive Patient Education  [employer].

## 2018-04-12 NOTE — Telephone Encounter (Signed)
Dr. 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 She placed the referral to vascular surgery, but if this is incorrect, please let us know.

## 2018-04-12 NOTE — Progress Notes (Signed)
HPI: Nicole Mccann, 72 -year-old pleasant [place] lady returns for followup.    04-12-2018, Rv with urgent appointment Nicole Mccann reports having a new , sudden attack like headpain or facial pain-   This affects the left temple, feels electric 10 out of 10, and higher in intensity. Sharp eye pain on the left only, feeling her vision has not returnedto normal.  In the same timeframe she felt heat wave coming over her, her stomach felt squizzy, she felt as if having indigestion.  She nearly passed out last Friday.  She admits to not hydrating well, her BP was in normal range and heart rate regular.     She had more falls, but none associated with a seizure or olfactory aura.   Continues medication -  She was so dizzy she felt she fall , but she has less headaches and is less fatigued and sleepy on CPAP. She is highly compliant.  The patient is a 97% compliance on her last download 29 out of 30 days was 5 hours and 45 minutes of average use per night she is on an auto set 4-12 cm water pressure with 3 cm EPR her residual AHI is 1.7 which is an excellent result Epworth remains at 10 points fatigue at only 20 points.  06-28-12  Patient was seen in the EMU at [facility] and diagnosed with an olfactory aura, no EEG abnormalities. 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  The Keppra was well tolerated. She has only once a week any aura, not daily.  November 21, 2012. Patient developed depression, possibly worseing on Keppra which we weaned off. She is no longer taking Coumadin for the presumed upper extremity DVT (Dr. Azucena Kuba, [facility])  perhaps rather a thrombophlebitis. She was changed to Lamictal and was doing well on 100 mg XR daily in the AM  after a slow titration.  She will progress to 100 mg now.   05/21/13-CM  followup visit today and patient has had 2 auras  since last seen. Her Lamictal dose  is currently at 150 daily instead of 200 as Dr. Vickey Huger had ordered.  Apparently she did not understand that she was supposed to continue to titrate to 200. She denies any side effects to the medication. He has no new neurologic complaints. She is currently receiving lumbar epidural steroids by Dr. Arta Bruce for her low back pain.  05-22-14 - Psychiatrist recommend Wellbutrin, but changed to Zoloft after seizure concerns were discussed.  She has better mood on Lamictal,  He was sad " blue " on Keppra.  She was hospitalized in [place] , August 28 2013, after a seizure or syncope on a cruise. Her husband fainted  and needed hospital admission , too.  Both Bernacchis spent their [employer] in a hospital in [place]! Her husband is concerned about her loud snoring, she is obese and has been very fatigued. She denies waking up with headaches. Both have witnessed each others apneas.    Interval history from 06/07/2017. I have the pleasure of seeing Nicole Mccann- she has been and 97% compliance CPAP user with 6 hours and 36 minutes of average user time, using an auto set CPAP between 4 and 12 cm water with 3 cm EPR and her residual AHI of 1.8.  There is no evidence of central apneas emerging. She does have some air leaks, but they have not affected her  apnea count negatively. If she is comfortable with the current interface I would not necessarily change it. In addition she has advanced to a walker from a cane. Her back pain has improved the patient had a injection into the lower back by Dr. Modesto Charon, sacroiliac. Dr Metta Clines is her pain doctor.   Psychiatrically improved, Dr Madaline Guthrie,  less depressed, but she discontinued her prescription meds- all of them after she  was very sick following a dental extraction with abcess, she reported, from January- March 2018, as a side effect of antibiotics(.levofloxasin).   She appreciated a referral to [facility] , Dr. Dalbert Garnet.       Montreal Cognitive Assessment   06/03/2016  Visuospatial/ Executive (0/5) 5  Naming (0/3) 3  Attention: Read list of digits (0/2) 2  Attention: Read list of letters (0/1) 1  Attention: Serial 7 subtraction starting at 100 (0/3) 3  Language: Repeat phrase (0/2) 1  Language : Fluency (0/1) 0  Abstraction (0/2) 2  Delayed Recall (0/5) 4  Orientation (0/6) 5  Total 26  Adjusted Score (based on education) 26   VS - blood pressure.  Vitals:   04/12/18 1607  BP: (!) 142/71  Pulse: 66     Physical Exam General: obese - upset, anxious female. She is  seated, in  evident distress, walks with walker .  Head: head normocephalic and atraumatic. Oropharynx benign Neck: supple with no carotid  bruits Cardiovascular: regular rate and rhythm, no murmurs  Neurologic Exam ; temporal artery on the left is  indurated and painful to touch -    Mental Status: Awake and fully alert. Oriented to place and time. Follows all commands. Speech and language normal.   Cranial Nerves: Fundoscopic exam with evidence of cataract-  Pupils equal, briskly reactive to light.  Extraocular movements full with coarse  saccades, not a  nystagmus. Unable to follow in smooth pursuit. Visual fields - she reports trouble seeing the floor.  Hearing intact and symmetric to finger snap. Facial sensation intact.  Face, tongue, palate move normally and symmetrically.  Neck flexion and extension normal.  Motor: Normal bulk and tone. Normal strength in all tested extremity muscles. No focal weakness, but reports foot drop when her sugar is low !  Sensory : loss of vibration in toes. Diabetic neuropathy.   Tingling and pin and needle dyeasthesias.  Coordination: Rapid alternating movements normal in all extremities.  Finger-to-nose  performed accurately bilaterally. Gait and Station: Arises from chair without difficulty. Stance is wide-based, she ambulates with assistance but relays on a walker outside of the office.   Reflexes: 2+ and symmetric. Toes  downgoing.     ASSESSMENT:  Concerned temporal arteritis. Affecting vision in the left eye. Sed rate / c reactive protein and referral for artery biopsy.     PLAN: as above/  .     Melvyn Novas, MD     Patient ID: Nicole Mccann, female   DOB: [birth year], 72 y.o.   MRN: [id number]

## 2018-04-13 ENCOUNTER — Encounter: Payer: Self-pay | Admitting: Vascular Surgery

## 2018-04-13 ENCOUNTER — Ambulatory Visit (INDEPENDENT_AMBULATORY_CARE_PROVIDER_SITE_OTHER): Payer: Medicare Other | Admitting: Vascular Surgery

## 2018-04-13 ENCOUNTER — Encounter: Payer: Self-pay | Admitting: Neurology

## 2018-04-13 ENCOUNTER — Encounter (HOSPITAL_COMMUNITY): Payer: Self-pay

## 2018-04-13 ENCOUNTER — Encounter: Payer: Self-pay | Admitting: *Deleted

## 2018-04-13 ENCOUNTER — Other Ambulatory Visit: Payer: Self-pay | Admitting: *Deleted

## 2018-04-13 ENCOUNTER — Telehealth: Payer: Self-pay | Admitting: Neurology

## 2018-04-13 ENCOUNTER — Other Ambulatory Visit: Payer: Self-pay

## 2018-04-13 VITALS — BP 141/64 | HR 64 | Temp 99.1°F | Resp 22 | Ht 60.0 in | Wt 267.0 lb

## 2018-04-13 DIAGNOSIS — M316 Other giant cell arteritis: Secondary | ICD-10-CM | POA: Diagnosis not present

## 2018-04-13 LAB — C-REACTIVE PROTEIN: CRP: 5.5 mg/L — AB (ref 0.0–4.9)

## 2018-04-13 LAB — SEDIMENTATION RATE: SED RATE: 28 mm/h (ref 0–40)

## 2018-04-13 MED ORDER — VANCOMYCIN HCL 10 G IV SOLR
1500.0000 mg | INTRAVENOUS | Status: AC
  Administered 2018-04-14: 1500 mg via INTRAVENOUS
  Filled 2018-04-13 (×2): qty 1500

## 2018-04-13 NOTE — Telephone Encounter (Signed)
Patient is binging seen today at 11:00 am . [facility] vascular . Patient and her husband are aware of details.

## 2018-04-13 NOTE — Progress Notes (Signed)
Referring Physician: Dr Vickey Huger  Patient name: Nicole Mccann MRN: [id number] DOB: 04-Jun-1946 Sex: female  REASON FOR CONSULT: Request for temporal artery biopsy  HPI: Nicole Mccann is a 72 y.o. female furred for evaluation of temporal arteritis.  Complains primarily of headaches on the left side of her head with radiation into the left base of her neck.  He denies any visual changes.  She had a sed rate and C-reactive protein checked yesterday.  The sed rate was normal.  The C-reactive protein was just above the upper limits of normal.  Other medical problems include diabetes hyperlipidemia sleep apnea.  These are all currently stable.  Past Medical History:  Diagnosis Date  . Allergy   . Anxiety    Dr Madaline Guthrie  . Asthma   . Atypical seizure (HCC) 06/07/2012  . Depression    Dr Madaline Guthrie  . Diabetes mellitus   . DVT (deep venous thrombosis) (HCC)   . Headache(784.0)   . Hyperlipidemia   . Hypersomnia with sleep apnea, unspecified 05/22/2014  . Olfactory aura   . OSA on CPAP   . Shingles 07/2013   Past Surgical History:  Procedure Laterality Date  . APPENDECTOMY    . BREAST EXCISIONAL BIOPSY Right 1998  . CHOLECYSTECTOMY    . COLONOSCOPY  2006   [facility]  . rotator cuff surgery    . TONSILLECTOMY       Family History  Problem Relation Age of Onset  . Stomach cancer Father   . Coronary artery disease Father   . Breast cancer Sister 68       breast cancer  . Diabetes Maternal Aunt   . Diabetes Paternal Aunt   . Prostate cancer Brother   . Cancer Daughter   . Stroke Neg Hx     SOCIAL HISTORY: Social History   Socioeconomic History  . Marital status: Married    Spouse name: Lars Mage  . Number of children: 2  . Years of education: College  . Highest education level: Not on file  Occupational History  . Not on file  Social Needs  . Financial resource strain: Not on file  . Food insecurity:    Worry: Not on file    Inability: Not on file  .  Transportation needs:    Medical: Not on file    Non-medical: Not on file  Tobacco Use  . Smoking status: Never Smoker  . Smokeless tobacco: Never Used  Substance and Sexual Activity  . Alcohol use: Yes    Alcohol/week: 1.8 oz    Types: 3 Glasses of wine per week    Comment: 1/2 glass wine daily  . Drug use: No  . Sexual activity: Never  Lifestyle  . Physical activity:    Days per week: Not on file    Minutes per session: Not on file  . Stress: Not on file  Relationships  . Social connections:    Talks on phone: Not on file    Gets together: Not on file    Attends religious service: Not on file    Active member of club or organization: Not on file    Attends meetings of clubs or organizations: Not on file    Relationship status: Not on file  . Intimate partner violence:    Fear of current or ex partner: Not on file    Emotionally abused: Not on file    Physically abused: Not on file    Forced sexual activity:  Not on file  Other Topics Concern  . Not on file  Social History Narrative   Patient is married Lars Mage) and lives at home with her husband.   Patient has two adult children.   Patient is retired.   Patient has a college education.   Patient is right-handed.   Patient drinks one cup of tea daily.    Allergies  Allergen Reactions  . Mushroom Ext Cmplx(Shiitake-Reishi-Mait) Anaphylaxis  . Shellfish Allergy Anaphylaxis  . Sulfonamide Derivatives Anaphylaxis  . Fluticasone-Salmeterol Other (See Comments)    Feels like something in the throat  . Butalbital-Aspirin-Caffeine     unknown  . Levofloxacin Other (See Comments)  . Montelukast Sodium     unknown  . Moxifloxacin     unknown  . Orphenadrine Citrate     unknown  . Penicillins     unknown  . Acetaminophen Other (See Comments)    Stomach discomfort  . Clarithromycin Nausea And Vomiting  . Iodinated Diagnostic Agents Other (See Comments)    Bad headache; no prep needed  . Orphenadrine Nausea And  Vomiting    Current Outpatient Medications  Medication Sig Dispense Refill  . furosemide (LASIX) 20 MG tablet Take 1 tablet (20 mg total) by mouth daily. Use as needed for swelling of your legs (Patient not taking: Reported on 04/12/2018) 30 tablet 1  . glucose blood test strip Use as instructed 2x a day - One Touch Verio 200 each 3  . OVER THE COUNTER MEDICATION Lidocaine 4% pain relieving gel-patch. Apply one patch to affected area no more than 3 to 4 times daily     No current facility-administered medications for this visit.     ROS:   General:  No weight loss, Fever, chills  Neurologic: No dizziness, blackouts, seizures. No recent symptoms of stroke or mini- stroke. No recent episodes of slurred speech, or temporary blindness.    Physical Examination  Vitals:   04/13/18 1115  BP: (!) 141/64  Pulse: 64  Resp: (!) 22  Temp: 99.1 F (37.3 C)  TempSrc: Oral  SpO2: 97%  Weight: 267 lb (121.1 kg)  Height: 5' (1.524 m)    Body mass index is 52.14 kg/m.  General:  Alert and oriented, no acute distress HEENT: Normal, 2+ temporal artery pulses tenderness over left side of temporal region on palpation Neck: No bruit or JVD Pulmonary: Clear to auscultation bilaterally Cardiac: Regular Rate and Rhythm without murmur Skin: No rash  ASSESSMENT: Headaches left side   PLAN: If temporal artery biopsy April 14, 2018.  Risk benefits possible complications and procedure details were explained to the patient and her husband today.  These include but are not limited to bleeding infection.  Incisional numbness.  She understands and agrees to proceed.   Fabienne Bruns, MD [facility] Office: [phone] Pager: [phone]

## 2018-04-13 NOTE — Telephone Encounter (Signed)
I called pt's husband, had an extended conversation with him. He reports that Dr. Vickey Huger did not explain what a temporal artery biopsy was, and they are wondering what to expect. I explained what a temporal artery biopsy was, and advised them that this helps dx temporal arteritis. Dr. Nicky Pugh office should be calling the pt this morning to get the procedure scheduled today. Pt should remain NPO, as Annabelle Harman discussed with the pt, per Dr. Nicky Pugh instructions. I asked pt's husband to call us and Dr. Nicky Pugh office if they have not received a call by 10:00am regarding this procedure. Pt's husband has Dr. Nicky Pugh office phone number and address. Pt's husband verbalized understanding and appreciation.

## 2018-04-13 NOTE — Telephone Encounter (Signed)
Patient in surgical office since 11 AM today, for temporal artery biopsy.  Has a steroid prescription already . RV 14-21 days from now- please consider Friday opening.

## 2018-04-13 NOTE — H&P (View-Only) (Signed)
Referring Physician: Dr Vickey Huger  Patient name: Nicole Mccann MRN: [id number] DOB: 04-Jun-1946 Sex: female  REASON FOR CONSULT: Request for temporal artery biopsy  HPI: Nicole Mccann is a 72 y.o. female furred for evaluation of temporal arteritis.  Complains primarily of headaches on the left side of her head with radiation into the left base of her neck.  He denies any visual changes.  She had a sed rate and C-reactive protein checked yesterday.  The sed rate was normal.  The C-reactive protein was just above the upper limits of normal.  Other medical problems include diabetes hyperlipidemia sleep apnea.  These are all currently stable.  Past Medical History:  Diagnosis Date  . Allergy   . Anxiety    Dr Madaline Guthrie  . Asthma   . Atypical seizure (HCC) 06/07/2012  . Depression    Dr Madaline Guthrie  . Diabetes mellitus   . DVT (deep venous thrombosis) (HCC)   . Headache(784.0)   . Hyperlipidemia   . Hypersomnia with sleep apnea, unspecified 05/22/2014  . Olfactory aura   . OSA on CPAP   . Shingles 07/2013   Past Surgical History:  Procedure Laterality Date  . APPENDECTOMY    . BREAST EXCISIONAL BIOPSY Right 1998  . CHOLECYSTECTOMY    . COLONOSCOPY  2006   [facility]  . rotator cuff surgery    . TONSILLECTOMY       Family History  Problem Relation Age of Onset  . Stomach cancer Father   . Coronary artery disease Father   . Breast cancer Sister 68       breast cancer  . Diabetes Maternal Aunt   . Diabetes Paternal Aunt   . Prostate cancer Brother   . Cancer Daughter   . Stroke Neg Hx     SOCIAL HISTORY: Social History   Socioeconomic History  . Marital status: Married    Spouse name: Lars Mage  . Number of children: 2  . Years of education: College  . Highest education level: Not on file  Occupational History  . Not on file  Social Needs  . Financial resource strain: Not on file  . Food insecurity:    Worry: Not on file    Inability: Not on file  .  Transportation needs:    Medical: Not on file    Non-medical: Not on file  Tobacco Use  . Smoking status: Never Smoker  . Smokeless tobacco: Never Used  Substance and Sexual Activity  . Alcohol use: Yes    Alcohol/week: 1.8 oz    Types: 3 Glasses of wine per week    Comment: 1/2 glass wine daily  . Drug use: No  . Sexual activity: Never  Lifestyle  . Physical activity:    Days per week: Not on file    Minutes per session: Not on file  . Stress: Not on file  Relationships  . Social connections:    Talks on phone: Not on file    Gets together: Not on file    Attends religious service: Not on file    Active member of club or organization: Not on file    Attends meetings of clubs or organizations: Not on file    Relationship status: Not on file  . Intimate partner violence:    Fear of current or ex partner: Not on file    Emotionally abused: Not on file    Physically abused: Not on file    Forced sexual activity:  Not on file  Other Topics Concern  . Not on file  Social History Narrative   Patient is married Lars Mage) and lives at home with her husband.   Patient has two adult children.   Patient is retired.   Patient has a college education.   Patient is right-handed.   Patient drinks one cup of tea daily.    Allergies  Allergen Reactions  . Mushroom Ext Cmplx(Shiitake-Reishi-Mait) Anaphylaxis  . Shellfish Allergy Anaphylaxis  . Sulfonamide Derivatives Anaphylaxis  . Fluticasone-Salmeterol Other (See Comments)    Feels like something in the throat  . Butalbital-Aspirin-Caffeine     unknown  . Levofloxacin Other (See Comments)  . Montelukast Sodium     unknown  . Moxifloxacin     unknown  . Orphenadrine Citrate     unknown  . Penicillins     unknown  . Acetaminophen Other (See Comments)    Stomach discomfort  . Clarithromycin Nausea And Vomiting  . Iodinated Diagnostic Agents Other (See Comments)    Bad headache; no prep needed  . Orphenadrine Nausea And  Vomiting    Current Outpatient Medications  Medication Sig Dispense Refill  . furosemide (LASIX) 20 MG tablet Take 1 tablet (20 mg total) by mouth daily. Use as needed for swelling of your legs (Patient not taking: Reported on 04/12/2018) 30 tablet 1  . glucose blood test strip Use as instructed 2x a day - One Touch Verio 200 each 3  . OVER THE COUNTER MEDICATION Lidocaine 4% pain relieving gel-patch. Apply one patch to affected area no more than 3 to 4 times daily     No current facility-administered medications for this visit.     ROS:   General:  No weight loss, Fever, chills  Neurologic: No dizziness, blackouts, seizures. No recent symptoms of stroke or mini- stroke. No recent episodes of slurred speech, or temporary blindness.    Physical Examination  Vitals:   04/13/18 1115  BP: (!) 141/64  Pulse: 64  Resp: (!) 22  Temp: 99.1 F (37.3 C)  TempSrc: Oral  SpO2: 97%  Weight: 267 lb (121.1 kg)  Height: 5' (1.524 m)    Body mass index is 52.14 kg/m.  General:  Alert and oriented, no acute distress HEENT: Normal, 2+ temporal artery pulses tenderness over left side of temporal region on palpation Neck: No bruit or JVD Pulmonary: Clear to auscultation bilaterally Cardiac: Regular Rate and Rhythm without murmur Skin: No rash  ASSESSMENT: Headaches left side   PLAN: If temporal artery biopsy April 14, 2018.  Risk benefits possible complications and procedure details were explained to the patient and her husband today.  These include but are not limited to bleeding infection.  Incisional numbness.  She understands and agrees to proceed.   Fabienne Bruns, MD [facility] Office: [phone] Pager: [phone]

## 2018-04-13 NOTE — Telephone Encounter (Signed)
Called and left message for on call doctor . Dr. Leonides Sake called back he is going to work patient in today at some time . Dr. Leonides Sake Wanted me to call Patient and tell her not to eat or drink. Dr. Leonides Sake Will send a message to his office to call patient today and get her worked in for Biopsy. [phone]  I have called and spoke to patient  and her husband with all details they understand they have the address they are waiting on the call .   Dr.Dohmeier,  Patient and her husband would like to know what is temporal artery biopsy  ? Can you call them this morning

## 2018-04-14 ENCOUNTER — Ambulatory Visit (HOSPITAL_COMMUNITY): Payer: Medicare Other | Admitting: Anesthesiology

## 2018-04-14 ENCOUNTER — Encounter (HOSPITAL_COMMUNITY)
Admission: RE | Disposition: A | Discharge status: Home / Self Care | Payer: Self-pay | Source: Ambulatory Visit | Attending: Vascular Surgery

## 2018-04-14 ENCOUNTER — Encounter (HOSPITAL_COMMUNITY): Payer: Self-pay

## 2018-04-14 ENCOUNTER — Ambulatory Visit (HOSPITAL_COMMUNITY)
Admission: RE | Admit: 2018-04-14 | Discharge: 2018-04-14 | Disposition: A | Discharge status: Home / Self Care | Payer: Medicare Other | Source: Ambulatory Visit | Attending: Vascular Surgery | Admitting: Vascular Surgery

## 2018-04-14 DIAGNOSIS — R51 Headache: Secondary | ICD-10-CM | POA: Diagnosis not present

## 2018-04-14 DIAGNOSIS — Z86718 Personal history of other venous thrombosis and embolism: Secondary | ICD-10-CM | POA: Diagnosis not present

## 2018-04-14 DIAGNOSIS — Z9989 Dependence on other enabling machines and devices: Secondary | ICD-10-CM | POA: Insufficient documentation

## 2018-04-14 DIAGNOSIS — E119 Type 2 diabetes mellitus without complications: Secondary | ICD-10-CM | POA: Insufficient documentation

## 2018-04-14 DIAGNOSIS — G4733 Obstructive sleep apnea (adult) (pediatric): Secondary | ICD-10-CM | POA: Insufficient documentation

## 2018-04-14 HISTORY — PX: ARTERY BIOPSY: SHX891

## 2018-04-14 HISTORY — DX: Hypotension, unspecified: I95.9

## 2018-04-14 LAB — GLUCOSE, CAPILLARY
GLUCOSE-CAPILLARY: 116 mg/dL — AB (ref 65–99)
Glucose-Capillary: 98 mg/dL (ref 65–99)

## 2018-04-14 LAB — POCT I-STAT 4, (NA,K, GLUC, HGB,HCT)
GLUCOSE: 123 mg/dL — AB (ref 65–99)
HEMATOCRIT: 44 % (ref 36.0–46.0)
HEMOGLOBIN: 15 g/dL (ref 12.0–15.0)
POTASSIUM: 4.4 mmol/L (ref 3.5–5.1)
SODIUM: 141 mmol/L (ref 135–145)

## 2018-04-14 SURGERY — BIOPSY TEMPORAL ARTERY
Anesthesia: Monitor Anesthesia Care | Site: Head | Laterality: Left

## 2018-04-14 MED ORDER — MIDAZOLAM HCL 5 MG/5ML IJ SOLN
INTRAMUSCULAR | Status: DC | PRN
  Administered 2018-04-14: 2 mg via INTRAVENOUS

## 2018-04-14 MED ORDER — ALBUTEROL SULFATE (2.5 MG/3ML) 0.083% IN NEBU
INHALATION_SOLUTION | RESPIRATORY_TRACT | Status: AC
  Filled 2018-04-14: qty 3

## 2018-04-14 MED ORDER — LACTATED RINGERS IV SOLN
INTRAVENOUS | Status: DC
  Administered 2018-04-14: 10:00:00 via INTRAVENOUS

## 2018-04-14 MED ORDER — DIPHENHYDRAMINE HCL 12.5 MG/5ML PO ELIX
ORAL_SOLUTION | ORAL | Status: AC
  Filled 2018-04-14: qty 10

## 2018-04-14 MED ORDER — OXYCODONE HCL 5 MG PO TABS: 5.0000 mg | ORAL_TABLET | Freq: Four times a day (QID) | ORAL | 0 refills | Status: DC | PRN

## 2018-04-14 MED ORDER — OXYCODONE HCL 5 MG/5ML PO SOLN: 5.0000 mg | Freq: Once | ORAL | Status: DC | PRN

## 2018-04-14 MED ORDER — LIDOCAINE HCL (PF) 1 % IJ SOLN
INTRAMUSCULAR | Status: DC | PRN
  Administered 2018-04-14: 15 mL

## 2018-04-14 MED ORDER — 0.9 % SODIUM CHLORIDE (POUR BTL) OPTIME
TOPICAL | Status: DC | PRN
  Administered 2018-04-14: 1000 mL

## 2018-04-14 MED ORDER — OXYCODONE HCL 5 MG PO TABS: 5.0000 mg | ORAL_TABLET | Freq: Once | ORAL | Status: DC | PRN

## 2018-04-14 MED ORDER — DEXMEDETOMIDINE HCL IN NACL 200 MCG/50ML IV SOLN
INTRAVENOUS | Status: DC | PRN
  Administered 2018-04-14 (×3): 4 ug via INTRAVENOUS

## 2018-04-14 MED ORDER — FENTANYL CITRATE (PF) 100 MCG/2ML IJ SOLN: 25.0000 ug | INTRAMUSCULAR | Status: DC | PRN

## 2018-04-14 MED ORDER — ONDANSETRON HCL 4 MG/2ML IJ SOLN: 4.0000 mg | Freq: Once | INTRAMUSCULAR | Status: DC | PRN

## 2018-04-14 MED ORDER — LIDOCAINE HCL (PF) 1 % IJ SOLN
INTRAMUSCULAR | Status: AC
  Filled 2018-04-14: qty 30

## 2018-04-14 MED ORDER — DIPHENHYDRAMINE HCL 12.5 MG/5ML PO LIQD
25.0000 mg | Freq: Once | ORAL | Status: AC
  Administered 2018-04-14: 25 mg via ORAL

## 2018-04-14 MED ORDER — FENTANYL CITRATE (PF) 100 MCG/2ML IJ SOLN
INTRAMUSCULAR | Status: DC | PRN
  Administered 2018-04-14: 50 ug via INTRAVENOUS

## 2018-04-14 MED ORDER — FENTANYL CITRATE (PF) 250 MCG/5ML IJ SOLN
INTRAMUSCULAR | Status: AC
  Filled 2018-04-14: qty 5

## 2018-04-14 MED ORDER — PROPOFOL 10 MG/ML IV BOLUS
INTRAVENOUS | Status: AC
  Filled 2018-04-14: qty 20

## 2018-04-14 MED ORDER — FAMOTIDINE PREMIXED 20-0.9 MG/50ML-% IV SOLN
20.0000 mg | INTRAVENOUS | Status: DC
  Filled 2018-04-14: qty 50

## 2018-04-14 MED ORDER — LIDOCAINE 2% (20 MG/ML) 5 ML SYRINGE
INTRAMUSCULAR | Status: AC
  Filled 2018-04-14: qty 5

## 2018-04-14 MED ORDER — SODIUM CHLORIDE 0.9 % IV SOLN: INTRAVENOUS | Status: DC

## 2018-04-14 MED ORDER — METHYLPREDNISOLONE SODIUM SUCC 125 MG IJ SOLR
INTRAMUSCULAR | Status: AC
  Filled 2018-04-14: qty 2

## 2018-04-14 MED ORDER — MIDAZOLAM HCL 2 MG/2ML IJ SOLN
INTRAMUSCULAR | Status: AC
  Filled 2018-04-14: qty 2

## 2018-04-14 SURGICAL SUPPLY — 35 items
CANISTER SUCT 3000ML PPV (MISCELLANEOUS) ×2 IMPLANT
CLIP VESOCCLUDE SM WIDE 6/CT (CLIP) ×4 IMPLANT
CONT SPEC 4OZ CLIKSEAL STRL BL (MISCELLANEOUS) ×2 IMPLANT
COTTONBALL LRG STERILE PKG (GAUZE/BANDAGES/DRESSINGS) ×2 IMPLANT
COVER SURGICAL LIGHT HANDLE (MISCELLANEOUS) ×4 IMPLANT
DECANTER SPIKE VIAL GLASS SM (MISCELLANEOUS) ×2 IMPLANT
DERMABOND ADVANCED (GAUZE/BANDAGES/DRESSINGS) ×1
DERMABOND ADVANCED .7 DNX12 (GAUZE/BANDAGES/DRESSINGS) ×1 IMPLANT
DRAPE BRACHIAL (DRAPES) ×2 IMPLANT
DRAPE HALF SHEET 40X57 (DRAPES) ×2 IMPLANT
DRAPE ORTHO SPLIT 77X108 STRL (DRAPES) ×1
DRAPE SURG ORHT 6 SPLT 77X108 (DRAPES) ×1 IMPLANT
ELECT REM PT RETURN 9FT ADLT (ELECTROSURGICAL) ×2
ELECTRODE REM PT RTRN 9FT ADLT (ELECTROSURGICAL) ×1 IMPLANT
GAUZE SPONGE 4X4 16PLY XRAY LF (GAUZE/BANDAGES/DRESSINGS) ×2 IMPLANT
GLOVE BIO SURGEON STRL SZ7.5 (GLOVE) ×2 IMPLANT
GOWN STRL NON-REIN LRG LVL3 (GOWN DISPOSABLE) ×2 IMPLANT
GOWN STRL REUS W/ TWL LRG LVL3 (GOWN DISPOSABLE) ×3 IMPLANT
GOWN STRL REUS W/TWL LRG LVL3 (GOWN DISPOSABLE) ×3
KIT BASIN OR (CUSTOM PROCEDURE TRAY) ×2 IMPLANT
KIT TURNOVER KIT B (KITS) ×2 IMPLANT
NEEDLE HYPO 25GX1X1/2 BEV (NEEDLE) ×2 IMPLANT
NS IRRIG 1000ML POUR BTL (IV SOLUTION) ×2 IMPLANT
PACK GENERAL/GYN (CUSTOM PROCEDURE TRAY) ×2 IMPLANT
PAD ARMBOARD 7.5X6 YLW CONV (MISCELLANEOUS) ×4 IMPLANT
SUCTION FRAZIER HANDLE 10FR (MISCELLANEOUS)
SUCTION TUBE FRAZIER 10FR DISP (MISCELLANEOUS) IMPLANT
SUT PROLENE 6 0 CC (SUTURE) IMPLANT
SUT SILK 3 0 (SUTURE) ×1
SUT SILK 3-0 18XBRD TIE 12 (SUTURE) ×1 IMPLANT
SUT VIC AB 3-0 SH 27 (SUTURE) ×1
SUT VIC AB 3-0 SH 27XBRD (SUTURE) ×1 IMPLANT
SYR CONTROL 10ML LL (SYRINGE) ×2 IMPLANT
TOWEL GREEN STERILE (TOWEL DISPOSABLE) ×2 IMPLANT
WATER STERILE IRR 1000ML POUR (IV SOLUTION) ×2 IMPLANT

## 2018-04-14 NOTE — Progress Notes (Signed)
Dr Darrick Penna notified of sudden red rash around patients face and back. Patient states it is very itchy. Of note, IV vanc still running upon arrival to PACU. Dr Darrick Penna ordered IV Vanc to be stopped and 25mg  Benedryl Elixir given PO. Will still proceed with discharge.

## 2018-04-14 NOTE — Transfer of Care (Signed)
Immediate Anesthesia Transfer of Care Note  Patient: Nicole Mccann  Procedure(s) Performed: BIOPSY TEMPORAL ARTERY (Left Head)  Patient Location: PACU  Anesthesia Type:MAC  Level of Consciousness: awake and patient cooperative  Airway & Oxygen Therapy: Patient Spontanous Breathing  Post-op Assessment: Report given to RN and Post -op Vital signs reviewed and stable  Post vital signs: Reviewed and stable  Last Vitals:  Vitals Value Taken Time  BP    Temp    Pulse 66 04/14/2018 11:31 AM  Resp 39 04/14/2018 11:31 AM  SpO2 99 % 04/14/2018 11:31 AM  Vitals shown include unvalidated device data.  Last Pain:  Vitals:   04/14/18 0906  TempSrc:   PainSc: 3       Patients Stated Pain Goal: 1 (25/50/01 6429)  Complications: No apparent anesthesia complications

## 2018-04-14 NOTE — Progress Notes (Signed)
Anesthesiology note:  As noted  n PACU following her temporal artery biopsy, while receiving IV  Vancomycin,  I she developed itching, redness, and a sensation of fullness in her throat.  She was given Solu-Medrol, Benadryl, Pepcid, and an albuterol hand-held nebulizer.  Over the next 15 to 30 minutes her symptoms resolved.    VS T-36.5 BP- 36/73 RR- 15 HR-70 O2 Sat 97% on RA No edema of tongue or upper airway Lungs- clear no wheezing  Impression: "red man" syndrome secondary to vancomycin. Now resolved.  She will be discharged home.  I explained to Nicole Mccann that this represents a side effect from the vancomycin and was not a true allergic reaction.  However, vancomycin should be avoided in the future unless there is no other alternative antibiotic available.  Kipp Brood

## 2018-04-14 NOTE — Anesthesia Procedure Notes (Signed)
Procedure Name: MAC Date/Time: 04/14/2018 10:50 AM Performed by: Lance Coon, CRNA Pre-anesthesia Checklist: Patient identified, Emergency Drugs available, Suction available, Patient being monitored and Timeout performed Patient Re-evaluated:Patient Re-evaluated prior to induction Oxygen Delivery Method: Nasal cannula

## 2018-04-14 NOTE — Op Note (Signed)
Procedure: Left temporal artery biopsy  Preoperative diagnosis: Headaches  Postoperative diagnosis: Same  Anesthesia: Local with IV sedation  Specimens: Left temporal artery  Operative details: After obtaining informed consent, the patient was taken to the operating room. The patient was placed in supine position on the operating room table. Next Doppler was used to map out the course of the left superficial temporal artery. This area was prepped and draped in usual sterile fashion. Local anesthesia was infiltrated over this area. Skin incision was made over the area of the left superficial temporal artery. The temporal artery was dissected free circumferentially. This was ligated proximally and distally with 3-0 silk ties. The intervening segment of approximately 3 cm was excised completely and sent to pathology as a specimen. Hemostasis was obtained. The skin was closed with 4 0 Vicryl subcuticular stitch. Dermabond was applied the skin incision. The patient tolerated procedure well and there were no complications. Instrument sponge and needle counts were correct at the end of the case. The patient was taken to the recovery room in stable condition.  Rosiland Sen, MD [facility] [place] Office: [phone] Pager: [phone]   

## 2018-04-14 NOTE — Anesthesia Preprocedure Evaluation (Addendum)
Anesthesia Evaluation  Patient identified by MRN, date of birth, ID band  Reviewed: Allergy & Precautions, NPO status , Patient's Chart, lab work & pertinent test results  Airway Mallampati: II  TM Distance: >3 FB Neck ROM: Full    Dental  (+) Teeth Intact   Pulmonary    breath sounds clear to auscultation       Cardiovascular  Rhythm:Regular Rate:Normal     Neuro/Psych    GI/Hepatic   Endo/Other  diabetes  Renal/GU      Musculoskeletal   Abdominal   Peds  Hematology   Anesthesia Other Findings   Reproductive/Obstetrics                             Anesthesia Physical Anesthesia Plan  ASA: III  Anesthesia Plan: MAC   Post-op Pain Management:    Induction: Intravenous  PONV Risk Score and Plan: Dexamethasone and Ondansetron  Airway Management Planned: Natural Airway and Simple Face Mask  Additional Equipment:   Intra-op Plan:   Post-operative Plan:   Informed Consent: I have reviewed the patients History and Physical, chart, labs and discussed the procedure including the risks, benefits and alternatives for the proposed anesthesia with the patient or authorized representative who has indicated his/her understanding and acceptance.   Dental advisory given  Plan Discussed with: CRNA and Anesthesiologist  Anesthesia Plan Comments:         Anesthesia Quick Evaluation

## 2018-04-14 NOTE — Anesthesia Postprocedure Evaluation (Signed)
Anesthesia Post Note  Patient: [name]  Procedure(s) Performed: BIOPSY TEMPORAL ARTERY (Left Head)     Patient location during evaluation: PACU Anesthesia Type: MAC Level of consciousness: awake and alert Pain management: pain level controlled Vital Signs Assessment: post-procedure vital signs reviewed and stable Respiratory status: spontaneous breathing, nonlabored ventilation, respiratory function stable and patient connected to nasal cannula oxygen Cardiovascular status: stable and blood pressure returned to baseline Postop Assessment: no apparent nausea or vomiting Anesthetic complications: no    Last Vitals:  Vitals:   04/14/18 1355 04/14/18 1400  BP: 136/73   Pulse: 69 70  Resp: 15 13  Temp:    SpO2: 99% 96%    Last Pain:  Vitals:   04/14/18 1355  TempSrc:   PainSc: 3                  Teara Duerksen COKER

## 2018-04-14 NOTE — Progress Notes (Addendum)
Patient reports itching to back/chest areas. Patient received IV benadryl and IV vanc was discontinued. Patient then started to complain of shortness of breath. Dr. Richardson Landry and lead CRNA made aware and to bedside. Solumedrol & Pepcid administered by OR staff at bedside. Primary RN will continue to monitor.

## 2018-04-14 NOTE — Progress Notes (Signed)
Pt arrived into Phase 2 for discharge with reddness and itching. Previous RN got Benadryl order from MD, Benadryl given to pt by this RN. Seconds after pt took Benadryl, pt stated "throat is closing." Pt remained upright in wheelchair with 6L [place] applied for comfort, with oxygen saturations at 99%. RN announced for additional support from anesthesia. CRNA and Anesthesiologist arrived at bedside to assist. Solumedrol and Pepcid ordered and given. Once pts throat felt better, pt placed on stretcher and nebulizer treatment was administered via CRNA. PT is currently resting comfortably with breathing approaching baseline. Will continue to monitor.

## 2018-04-14 NOTE — Progress Notes (Signed)
Pt is on room air with oxygen saturations in the high 90s. Pt able to tolerate PO liquids without difficulty, and breathing is back to baseline. Per Dr. Noreene Larsson, pt is good to be discharged home in her husbands care.

## 2018-04-14 NOTE — Interval H&P Note (Signed)
History and Physical Interval Note:  04/14/2018 9:13 AM  Nicole Mccann  has presented today for surgery, with the diagnosis of headache  The various methods of treatment have been discussed with the patient and family. After consideration of risks, benefits and other options for treatment, the patient has consented to  Procedure(s): BIOPSY TEMPORAL ARTERY (Left) as a surgical intervention .  The patient's history has been reviewed, patient examined, no change in status, stable for surgery.  I have reviewed the patient's chart and labs.  Questions were answered to the patient's satisfaction.     Fabienne Bruns

## 2018-04-15 ENCOUNTER — Encounter (HOSPITAL_COMMUNITY): Payer: Self-pay | Admitting: Vascular Surgery

## 2018-04-17 ENCOUNTER — Other Ambulatory Visit: Payer: Self-pay | Admitting: Neurology

## 2018-04-18 ENCOUNTER — Ambulatory Visit (INDEPENDENT_AMBULATORY_CARE_PROVIDER_SITE_OTHER): Payer: Medicare Other | Admitting: Internal Medicine

## 2018-04-18 ENCOUNTER — Encounter: Payer: Self-pay | Admitting: Internal Medicine

## 2018-04-18 VITALS — BP 136/80 | HR 74 | Ht 60.0 in | Wt 267.0 lb

## 2018-04-18 DIAGNOSIS — E1142 Type 2 diabetes mellitus with diabetic polyneuropathy: Secondary | ICD-10-CM | POA: Diagnosis not present

## 2018-04-18 DIAGNOSIS — E0842 Diabetes mellitus due to underlying condition with diabetic polyneuropathy: Secondary | ICD-10-CM

## 2018-04-18 LAB — POCT GLYCOSYLATED HEMOGLOBIN (HGB A1C): HEMOGLOBIN A1C: 6.3 % — AB (ref 4.0–5.6)

## 2018-04-18 NOTE — Addendum Note (Signed)
Addended by: Dyllen Menning on: 04/18/2018 04:22 PM   Modules accepted: Orders  

## 2018-04-18 NOTE — Patient Instructions (Signed)
Please continue off metformin.  Please let me know if the sugars are consistently <80 or >200.  Please return in 6 months with your sugar log.

## 2018-04-18 NOTE — Progress Notes (Signed)
Patient ID: Nicole Mccann, female   DOB: June 28, 1946, 72 y.o.   MRN: [id number]   HPI: Nicole Mccann is a 72 y.o.-year-old female, returning for follow-up for DM2, dx in initially in 1965 with GDM, then again GDM in 1974, non-insulin-dependent, uncontrolled, with long-term complications (PN). She saw Dr. Chestine Spore before, but he retired.  Last visit with me 3 months ago.  She is here with her husband and her daughter.  They both offer part of the history especially regarding her diet and her past medical history.  She has HAs >> recent temporal artery Bx negative. She developed anaphylaxis to Vancomycin While in the hospital.  Last hemoglobin A1c was: Lab Results  Component Value Date   HGBA1C 6.4 12/12/2017   HGBA1C 6.2 02/09/2017   HGBA1C 6.1 01/21/2016   She is off diabetes medicines.  She was previously on metformin ER 750 mg once a day but only taken if sugars higher than 120.  She stopped due to abdominal pain and diarrhea and sugars did not significantly worsen.  At last visit, we continue off the medicine.  Pt checks her sugars  1 X a day, only in the morning. - am: 107-134 >> 107-130 - 2h after b'fast: n/c - before lunch: n/c - 2h after lunch: n/c - before dinner: n/c - 2h after dinner: n/c - bedtime: n/c - nighttime: n/c Lowest sugar was 111 >> 107; she has hypoglycemia awareness in the 70s. Highest: 140  Glucometer: One Touch Verio  Pt's meals are: - Breakfast: cereals, yoghurt (Activia), pastries, tea, honey - Lunch: salad, leftover sandwich, fruit - Dinner: meat, veggies, potato - Snacks: Sweets, she has wine every day, occasionally sweet liquor  She was doing intermittent fasting at last visit, but stopped recently.  -No CKD, last BUN/creatinine:  Lab Results  Component Value Date   BUN 16 12/12/2017   BUN 18 11/09/2017   CREATININE 0.88 12/12/2017   CREATININE 0.79 11/09/2017   -+ HL; last set of lipids: Lab Results  Component Value Date   CHOL 245  (A) 02/09/2017   HDL 59 02/09/2017   LDLCALC 154 02/09/2017   LDLDIRECT 107.9 07/25/2012   TRIG 162 (A) 02/09/2017   CHOLHDL 6 07/25/2012  Not on a statin. - last eye exam was in 09/2017: No DR, +  cataracts -No numbness and tingling in her feet.  Pt has FH of DM in aunts.  She also has a history of OSA.  ROS: Constitutional: no weight gain/no weight loss, + fatigue, + subjective hyperthermia,  + subjective hypothermia, + nocturia Eyes:+ blurry vision, no xerophthalmia ENT: no sore throat, no nodules palpated in throat, + dysphagia, no odynophagia, no hoarseness Cardiovascular: no CP/+ SOB/no palpitations/no leg swelling Respiratory: no cough/+ SOB/+ wheezing Gastrointestinal: no N/no V/no D/no C/no acid reflux Musculoskeletal: + muscle aches/no joint aches Skin: no rashes, no hair loss Neurological: no tremors/no numbness/no tingling/+ dizziness  I reviewed pt's medications, allergies, PMH, social hx, family hx, and changes were documented in the history of present illness. Otherwise, unchanged from my initial visit note.  Past Medical History:  Diagnosis Date  . Allergy   . Anxiety    Dr Madaline Guthrie  . Asthma   . Atypical seizure (HCC) 06/07/2012  . Depression    Dr Madaline Guthrie  . Diabetes mellitus   . DVT (deep venous thrombosis) (HCC)   . Headache(784.0)   . Hyperlipidemia   . Hypersomnia with sleep apnea, unspecified 05/22/2014  . Low blood pressure   .  Olfactory aura   . OSA on CPAP   . Shingles 07/2013   Past Surgical History:  Procedure Laterality Date  . APPENDECTOMY    . ARTERY BIOPSY Left 04/14/2018   Procedure: BIOPSY TEMPORAL ARTERY;  Surgeon: Sherren Kerns, MD;  Location: [facility] OR;  Service: Vascular;  Laterality: Left;  . BREAST EXCISIONAL BIOPSY Right 1998  . CHOLECYSTECTOMY    . COLONOSCOPY  2006   [facility]  . rotator cuff surgery    . TONSILLECTOMY      Social History   Socioeconomic History  . Marital status: Married    Spouse name: Lars Mage  .  Number of children: 2  . Years of education: College  . Highest education level: Not on file  Occupational History  . Not on file  Social Needs  . Financial resource strain: Not on file  . Food insecurity:    Worry: Not on file    Inability: Not on file  . Transportation needs:    Medical: Not on file    Non-medical: Not on file  Tobacco Use  . Smoking status: Never Smoker  . Smokeless tobacco: Never Used  Substance and Sexual Activity  . Alcohol use: Yes    Alcohol/week: 1.8 oz    Types: 3 Glasses of wine per week    Comment: 1/2 glass wine daily  . Drug use: No  . Sexual activity: Never  Lifestyle  . Physical activity:    Days per week: Not on file    Minutes per session: Not on file  . Stress: Not on file  Relationships  . Social connections:    Talks on phone: Not on file    Gets together: Not on file    Attends religious service: Not on file    Active member of club or organization: Not on file    Attends meetings of clubs or organizations: Not on file    Relationship status: Not on file  . Intimate partner violence:    Fear of current or ex partner: Not on file    Emotionally abused: Not on file    Physically abused: Not on file    Forced sexual activity: Not on file  Other Topics Concern  . Not on file  Social History Narrative   Patient is married Lars Mage) and lives at home with her husband.   Patient has two adult children.   Patient is retired.   Patient has a college education.   Patient is right-handed.   Patient drinks one cup of tea daily.   Current Outpatient Medications on File Prior to Visit  Medication Sig Dispense Refill  . glucose blood test strip Use as instructed 2x a day - One Touch Verio 200 each 3  . OVER THE COUNTER MEDICATION Lidocaine 4% pain relieving gel-patch. Apply one patch to affected area no more than 3 to 4 times daily     No current facility-administered medications on file prior to visit.    Allergies  Allergen Reactions   . Mushroom Ext Cmplx(Shiitake-Reishi-Mait) Anaphylaxis  . Shellfish Allergy Anaphylaxis  . Sulfonamide Derivatives Anaphylaxis  . Vancomycin Anaphylaxis  . Fluticasone-Salmeterol Other (See Comments)    Feels like something in the throat  . Butalbital-Aspirin-Caffeine Other (See Comments)    UNSPECIFIED REACTION   . Montelukast Sodium Other (See Comments)    Feels like she's running out of breath  . Penicillins     UNSPECIFIED REACTION      Childhood allergy Has patient  had a PCN reaction causing immediate rash, facial/tongue/throat swelling, SOB or lightheadedness with hypotension: Unknown Has patient had a PCN reaction causing severe rash involving mucus membranes or skin necrosis: Unknown Has patient had a PCN reaction that required hospitalization: No Has patient had a PCN reaction occurring within the last 10 years: No If all of the above answers are "NO", then may proceed with Cephalosporin use.    . Acetaminophen Other (See Comments)    Stomach discomfort  . Clarithromycin Nausea And Vomiting  . Iodinated Diagnostic Agents Other (See Comments)    Bad headache; no prep needed  . Levofloxacin Diarrhea and Nausea And Vomiting    Dizziness Believes it was due to a high dosage of this medication  . Orphenadrine Nausea And Vomiting   Family History  Problem Relation Age of Onset  . Stomach cancer Father   . Coronary artery disease Father   . Breast cancer Sister 41       breast cancer  . Diabetes Maternal Aunt   . Diabetes Paternal Aunt   . Prostate cancer Brother   . Cancer Daughter   . Stroke Neg Hx    PE: BP 136/80   Pulse 74   Ht 5' (1.524 m)   Wt 247 lb 9.6 oz (112.3 kg)   SpO2 95%   BMI 48.36 kg/m  Wt Readings from Last 3 Encounters:  04/18/18 267 lb (121.1 kg)  04/14/18 265 lb (120.2 kg)  04/13/18 267 lb (121.1 kg)   Constitutional: overweight, in NAD Eyes: PERRLA, EOMI, no exophthalmos ENT: moist mucous membranes, no thyromegaly, no cervical  lymphadenopathy Cardiovascular: RRR, No MRG Respiratory: CTA B Gastrointestinal: abdomen soft, NT, ND, BS+ Musculoskeletal: no deformities, strength intact in all 4 Skin: moist, warm, + stasis dermatitis B legs Neurological: no tremor with outstretched hands, DTR normal in all 4  ASSESSMENT: 1. 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  Obesity class III  PLAN:  1. Patient with long-standing, uncontrolled, diabetes, now diet controlled after she stopped metformin ER due to GI side effects: The abdominal pain and bloating.  Her latest HbA1c before last visit was 6.4%, but this was higher compared to previous levels.  At last visit, she just came off metformin.  However, since sugars were at goal, we continued off the medication.  She was also doing intermittent fasting at that time and we discussed about other ways to improve her diet, by decreasing dietary fat and cholesterol to improve her insulin resistance. We also discussed about the importance of exercise to maintain insulin sensitivity. - Her preference would be to stay off medications for now - Reviewing her sugars, they are still at goal in the morning, but she is still not checking sugars later in the day I gave her her sugar log and we discussed again with patient and her family about how she should rotate t check times. - However, for now, there is no reason to start medication.  We again discussed about improving diet  - I suggested to:  Patient Instructions  Please continue off metformin  Please let me know if the sugars are consistently <80 or >200.  Please return in 6 months with your sugar log.   - today, HbA1c is 6.3% (better) - advised for yearly eye exams >> she is UTD - Return to clinic in 6 mo with sugar log   2. 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  Obesity class III -Did not lose weight since last visit -We discussed about the benefits of time restricted  feeding, which she was doing in the past.  Daughter is supportive.  Carlus Pavlov, MD PhD [facility]

## 2018-04-19 ENCOUNTER — Telehealth: Payer: Self-pay | Admitting: Neurology

## 2018-04-19 MED ORDER — CARBAMAZEPINE 200 MG PO TABS: 100.0000 mg | ORAL_TABLET | Freq: Three times a day (TID) | ORAL | 3 refills | Status: DC

## 2018-04-19 NOTE — Telephone Encounter (Signed)
Still has temporal head pain, a sharp sensation,  Let's try an anticonvulsant for pain control.  I will order low dose tegretol.

## 2018-04-19 NOTE — Telephone Encounter (Signed)
Pt husband(on DPR has called RN Baird Lyons back, he is requesting a call back

## 2018-04-19 NOTE — Telephone Encounter (Signed)
-----   Message from Melvyn Novas, MD sent at 04/18/2018  4:38 PM EDT ----- The surgical sample did not show evidence of temporal arteritis also known as giant cell arteritis.  The patient does not need to take her steroids.  If she has started taking her steroids she can discontinue this medication now.

## 2018-04-19 NOTE — Telephone Encounter (Signed)
Called the patient, no answer, LVM informing that the biopsy was negative and that Dr Vickey Huger wanted to inform her that she could stop taking the prednisone if she started it. LVM informing them they could call if wanting to discuss further.

## 2018-04-19 NOTE — Telephone Encounter (Signed)
Called the pt's husband back and informed him of the results. Informed him that Dr Vickey Huger looked over the lab work and biopsy report and it was negative. Pt had not started the steroids so informed him she would not need to start. Pt has a follow up apt on 6/26 with NP. Pt's husband is stating the pain in her head is still present and he is asking what do we do for that. I informed him I would make this mentioned to the MD and see what her thoughts or recommendations are.

## 2018-04-19 NOTE — Addendum Note (Signed)
Addended by: Melvyn Novas on: 04/19/2018 05:02 PM   Modules accepted: Orders

## 2018-04-20 NOTE — Telephone Encounter (Signed)
Called the patient's husband. No answer. LVM informing him that Dr Vickey Huger has sent a RX for the patient to [facility]. Left instructions on how she should take it and that he should pick it up at pharmacy to help with her pain.   If patient calls back please inform him of this as well. Dr Vickey Huger has called a script to help with pain to [facility] she should begin taking that medication and then she will follow up 6/26 with Shanda Bumps NP and see if its helping.

## 2018-04-20 NOTE — Telephone Encounter (Signed)
Pt returned call and I was able to inform them of the medication that was called in and they had multiple questions. Dr Dohmeier took the call over and completed the conversations addressing their concerns.

## 2018-04-28 NOTE — Progress Notes (Signed)
I thank Dr Darrick Penna for the update- .The patient is known to me .   Garnette Greb, MD

## 2018-05-03 ENCOUNTER — Ambulatory Visit (INDEPENDENT_AMBULATORY_CARE_PROVIDER_SITE_OTHER): Payer: Medicare Other | Admitting: Adult Health

## 2018-05-03 ENCOUNTER — Encounter: Payer: Self-pay | Admitting: Adult Health

## 2018-05-03 VITALS — BP 153/81 | HR 81 | Ht 60.0 in | Wt 269.6 lb

## 2018-05-03 DIAGNOSIS — G500 Trigeminal neuralgia: Secondary | ICD-10-CM

## 2018-05-03 MED ORDER — CARBAMAZEPINE 200 MG PO TABS: 200.0000 mg | ORAL_TABLET | Freq: Three times a day (TID) | ORAL | 3 refills | Status: DC

## 2018-05-03 NOTE — Patient Instructions (Addendum)
Your Plan:  Increase dose of tegretol from 100mg  to 200mg  three times daily  [phone] - ask for patient relations   Continue to use ice for head ache relief    Schedule appointment with Dr. Vickey Huger in 3 months or call earlier if needed       Thank you for coming to see [place] at [facility]. I hope we have been able to provide you high quality care today.  You may receive a patient satisfaction survey over the next few weeks. We would appreciate your feedback and comments so that we may continue to improve ourselves and the health of our patients.

## 2018-05-03 NOTE — Progress Notes (Signed)
[facility] [address] [place]. [place] [address] [phone]       OFFICE FOLLOW UP NOTE   HPI: Nicole Mccann, 72 -year-old pleasant [place] lady returns for followup.   05/03/2018: Patient did undergo biopsy on 04/14/2018 which was negative for temporal arteritis.  All lab work was also within normal limits.  At previous appointment, it was recommended for patient to start steroids but this was not started and after negative results, is recommended to not begin this.  It was recommended to start Tegretol for her pain.  She returns today for 3-week follow-up and is accompanied by her husband.  She continues to take Tegretol with only mild improvement but continues to have daily headaches and left-sided burning sensation that is present at her left temple, left cheek, and goes down the left side of her neck.  Recommended to increase Tegretol to 200 mg 3 times daily and patient is in agreement to this. Patient and husband are both upset regarding procedure of biopsy.  She had multiple large bruises from IV attempts, she was given an antibiotic that she states was on her allergy list (vancomycin) in which she had an anaphylactic reaction to and she is upset that she was not called for the test results.  She was under the impression that she had follow-up with Dr. Darrick Penna regarding biopsy results and incision assessment but she was told to follow-up with Dr. Vickey Huger.  Recommended that patient call patient relations at [facility] for these complaints.  Denies new or worsening symptoms or complaints at this time.   04-12-2018 Dr. Vickey Huger: Rv with urgent appointment Nicole. Nicole Mccann reports having a new , sudden attack like headpain or facial pain-  This affects the left temple, feels electric 10 out of 10, and higher in intensity. Sharp eye pain on the left only, feeling her vision has not returnedto normal.  In the same timeframe she felt heat wave coming over her, her stomach  felt squizzy, she felt as if having indigestion.  She nearly passed out last Friday.  She admits to not hydrating well, her BP was in normal range and heart rate regular.   She had more falls, but none associated with a seizure or olfactory aura.   Continues medication -  She was so dizzy she felt she fall , but she has less headaches and is less fatigued and sleepy on CPAP. She is highly compliant.  The patient is a 97% compliance on her last download 29 out of 30 days was 5 hours and 45 minutes of average use per night she is on an auto set 4-12 cm water pressure with 3 cm EPR her residual AHI is 1.7 which is an excellent result Epworth remains at 10 points fatigue at only 20 points.    VS - blood pressure.  Vitals:   05/03/18 1548  BP: (!) 153/81  Pulse: 81     Physical Exam General: obese pleasant Caucasian female from [place]. She is  seated, in  evident distress, walks with walker .  Head: head normocephalic and atraumatic. Oropharynx benign;  Neck: supple with no carotid  bruits Cardiovascular: regular rate and rhythm, no murmurs  Neurologic Exam   Mental Status: Awake and fully alert. Oriented to place and time. Follows all commands. Speech and language normal.   Cranial Nerves: Fundoscopic exam with evidence of cataract-  Pupils equal, briskly reactive to light. Visual fields intact in all 4 quadrants Hearing intact and symmetric to finger  snap. Facial sensation intact.  Face, tongue, palate move normally and symmetrically.  Neck flexion and extension normal.  Painful palpation over left temple, left cheek bone, left ear and down left neck Motor: Normal bulk and tone. Normal strength in all tested extremity muscles. No focal weakness, but reports foot drop when her sugar is low !   Sensory : loss of vibration in toes. Diabetic neuropathy.  Diminished sensory on left upper and lower extremity but per patient this is chronic Coordination: Rapid alternating movements normal  in all extremities.  Finger-to-nose  performed accurately bilaterally. Gait and Station: Arises from chair without difficulty. Stance is wide-based, she ambulates with assistance but relays on a walker outside of the office.   Reflexes: 2+ and symmetric. Toes downgoing.     LABORATORY:   CBC    Component Value Date/Time   WBC 8.1 11/09/2017 1447   RBC 4.68 11/09/2017 1447   HGB 15.0 04/14/2018 0932   HCT 44.0 04/14/2018 0932   PLT 221 11/09/2017 1447   MCV 92.1 11/09/2017 1447   MCH 30.6 11/09/2017 1447   MCHC 33.2 11/09/2017 1447   RDW 13.1 11/09/2017 1447   LYMPHSABS 2.5 11/09/2017 1447   MONOABS 1.0 11/09/2017 1447   EOSABS 0.3 11/09/2017 1447   BASOSABS 0.0 11/09/2017 1447    CMP Latest Ref Rng & Units 04/14/2018 12/12/2017 11/09/2017  Glucose 65 - 99 mg/dL 591(M) 384(Y) 659(D)  BUN 6 - 23 mg/dL - 16 18  Creatinine 3.57 - 1.20 mg/dL - 0.17 7.93  Sodium 903 - 145 mmol/L 141 140 138  Potassium 3.5 - 5.1 mmol/L 4.4 5.0 4.3  Chloride 96 - 112 mEq/L - 105 105  CO2 19 - 32 mEq/L - 30 28  Calcium 8.4 - 10.5 mg/dL - 9.2 0.0(P)  Total Protein 6.5 - 8.1 g/dL - - -  Total Bilirubin 0.3 - 1.2 mg/dL - - -  Alkaline Phos 38 - 126 U/L - - -  AST 15 - 41 U/L - - -  ALT 14 - 54 U/L - - -   C-Reactive Protein     Component Value Date/Time   CRP 5.5 (H) 04/12/2018 1708   Erythrocyte Sedimentation Rate     Component Value Date/Time   ESRSEDRATE 28 04/12/2018 1708   Surgical Pathology Temporal Biopsy 04/14/18 The surgical sample did not show evidence of temporal arteritis also known as giant cell arteritis. The patient does not need to take her steroids. If she has started taking her steroids she can discontinue this medication now.     ASSESSMENT:  Concerned temporal arteritis. Affecting vision in the left eye. Sed rate / c reactive protein and referral for artery biopsy.  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  Plan: Increase Tegretol from 100 mg 3 times daily to 200 mg 3 times daily Continue to use ice for nerve pain relief since this helped previously Phone number provided for patient relations for multiple complaints after temporal biopsy procedure  Advised patient to follow-up in 3 months with Dr. Vickey Huger call earlier if needed   George Hugh, AGNP-BC  [facility] [address] [place], [place] [address]  Phone [phone] Fax [phone] Note: This document was prepared with digital dictation and possible smart phrase technology. Any transcriptional errors that result from this process are unintentional.

## 2018-05-04 NOTE — Progress Notes (Signed)
Personally  participated in, made any corrections needed, and agree with history, physical, neuro exam,assessment and plan as stated above.    Annalese Stiner, MD [facility] 

## 2018-05-24 ENCOUNTER — Telehealth: Payer: Self-pay | Admitting: *Deleted

## 2018-05-24 NOTE — Telephone Encounter (Signed)
Received request for Medical Records & Attestation Statement  From [facility] Attn: Chart Retrieval; forwarded to [place] for email/scan/SLS 07/17

## 2018-06-06 ENCOUNTER — Encounter: Payer: Self-pay | Admitting: Neurology

## 2018-06-06 ENCOUNTER — Other Ambulatory Visit: Payer: Self-pay | Admitting: Family Medicine

## 2018-06-06 DIAGNOSIS — Z1231 Encounter for screening mammogram for malignant neoplasm of breast: Secondary | ICD-10-CM

## 2018-06-12 ENCOUNTER — Ambulatory Visit (INDEPENDENT_AMBULATORY_CARE_PROVIDER_SITE_OTHER): Payer: Medicare Other | Admitting: Neurology

## 2018-06-12 ENCOUNTER — Encounter: Payer: Self-pay | Admitting: Neurology

## 2018-06-12 VITALS — BP 158/76 | HR 65 | Ht 60.0 in | Wt 264.0 lb

## 2018-06-12 DIAGNOSIS — Z9889 Other specified postprocedural states: Secondary | ICD-10-CM | POA: Diagnosis not present

## 2018-06-12 NOTE — Progress Notes (Signed)
HPI: Nicole Mccann, 72 -year-old pleasant [place] lady returns for followup.   She had a negative biopsy for temporal arteritis, and did not need to take steroids. The patient had a very unpleasant surprise after her temporal artery biopsy when she had been brought to the recovery room.  She reports that she was considered ready for discharge, wanted to go to the bathroom and then her nurse noted that she was severely flushed face chest and back.  She also began wheezing.  It turns out that she was treated with an antibiotic she is allergic to, and she had an anaphylactic reaction.  She was given steroids and Benadryl and an inhaler. 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 She has hair roots growing close to the the incision site, or as she said" embedded".  She recovered from her  Headaches, weaned off Tegretol.         04-12-2018, Rv with urgent appointment Nicole. Marena Chancy reports having a new , sudden attack like headpain or facial pain-   This affects the left temple, feels electric 10 out of 10, and higher in intensity. Sharp eye pain on the left only, feeling her vision has not returnedto normal.  In the same timeframe she felt heat wave coming over her, her stomach felt squizzy, she felt as if having indigestion.  She nearly passed out last Friday.  She admits to not hydrating well, her BP was in normal range and heart rate regular.  HPI: Nicole Brickel, 40 -year-old pleasant [place] lady returns for followup.    CMP Latest Ref Rng & Units 04/14/2018 12/12/2017 11/09/2017  Glucose 65 - 99 mg/dL 098(J) 191(Y) 782(N)  BUN 6 - 23 mg/dL - 16 18  Creatinine 5.62 - 1.20 mg/dL - 1.30 8.65  Sodium 784 - 145 mmol/L 141 140 138  Potassium 3.5 - 5.1 mmol/L 4.4 5.0 4.3  Chloride 96 - 112 mEq/L - 105 105  CO2 19 - 32 mEq/L - 30 28  Calcium 8.4 - 10.5 mg/dL - 9.2 6.9(G)  Total Protein 6.5 - 8.1 g/dL - - -  Total Bilirubin 0.3 - 1.2 mg/dL - - -  Alkaline Phos 38 - 126 U/L - - -  AST 15 - 41 U/L - - -  ALT  14 - 54 U/L - - -   C-Reactive Protein     Component Value Date/Time   CRP 5.5 (H) 04/12/2018 1708   Erythrocyte Sedimentation Rate     Component Value Date/Time   ESRSEDRATE 28 04/12/2018 1708   Surgical Pathology Temporal Biopsy 04/14/18 The surgical sample did not show evidence of temporal arteritis also known as giant cell arteritis. The patient does not need to take her steroids. If she has started taking her steroids she can discontinue this medication now.        She had more falls, but none associated with a seizure or olfactory aura.   Continues medication -  She was so dizzy she felt she fall , but she has less headaches and is less fatigued and sleepy on CPAP. She is highly compliant.  The patient is a 97% compliance on her last download 29 out of 30 days was 5 hours and 45 minutes of average use per night she is on an auto set 4-12 cm water pressure with 3 cm EPR her residual AHI is 1.7 which is an excellent result Epworth remains at 10 points fatigue at only 20 points.  06-28-12  Patient was seen in the EMU  at [facility] and diagnosed with an olfactory aura, no EEG abnormalities. 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  The Keppra was well tolerated. She has only once a week any aura, not daily.  November 21, 2012. Patient developed depression, possibly worseing on Keppra which we weaned off. She is no longer taking Coumadin for the presumed upper extremity DVT (Dr. Azucena Kuba, [facility])  perhaps rather a thrombophlebitis. She was changed to Lamictal and was doing well on 100 mg XR daily in the AM  after a slow titration.  She will progress to 100 mg now.   05/21/13-CM  followup visit today and patient has had 2 auras  since last seen. Her Lamictal dose is currently at 150 daily instead of 200 as Dr. Vickey Huger had ordered.  Apparently she did not understand that she was  supposed to continue to titrate to 200. She denies any side effects to the medication. He has no new neurologic complaints. She is currently receiving lumbar epidural steroids by Dr. Arta Bruce for her low back pain.  05-22-14 - Psychiatrist recommend Wellbutrin, but changed to Zoloft after seizure concerns were discussed.  She has better mood on Lamictal,  He was sad " blue " on Keppra.  She was hospitalized in [place] , August 28 2013, after a seizure or syncope on a cruise. Her husband fainted  and needed hospital admission , too.  Both Bernacchis spent their [employer] in a hospital in [place]! Her husband is concerned about her loud snoring, she is obese and has been very fatigued. She denies waking up with headaches. Both have witnessed each others apneas.    Interval history from 06/07/2017. I have the pleasure of seeing Mrs. Gladney- she has been and 97% compliance CPAP user with 6 hours and 36 minutes of average user time, using an auto set CPAP between 4 and 12 cm water with 3 cm EPR and her residual AHI of 1.8.  There is no evidence of central apneas emerging. She does have some air leaks, but they have not affected her apnea count negatively. If she is comfortable with the current interface I would not necessarily change it. In addition she has advanced to a walker from a cane. Her back pain has improved the patient had a injection into the lower back by Dr. Modesto Charon, sacroiliac. Dr Metta Clines is her pain doctor.   Psychiatrically improved, Dr Madaline Guthrie,  less depressed, but she discontinued her prescription meds- all of them after she  was very sick following a dental extraction with abcess, she reported, from January- March 2018, as a side effect of antibiotics(.levofloxasin).   She appreciated a referral to [facility] , Dr. Dalbert Garnet.       Montreal Cognitive Assessment  06/03/2016  Visuospatial/ Executive (0/5) 5  Naming (0/3) 3  Attention: Read list of digits (0/2) 2  Attention: Read  list of letters (0/1) 1  Attention: Serial 7 subtraction starting at 100 (0/3) 3  Language: Repeat phrase (0/2) 1  Language : Fluency (0/1) 0  Abstraction (0/2) 2  Delayed Recall (0/5) 4  Orientation (0/6) 5  Total 26  Adjusted Score (based on education) 26   VS - blood pressure.  Vitals:   06/12/18 1033  BP: (!) 158/76  Pulse: 65     Physical Exam General: obese - upset, anxious female. She is  seated, in  evident distress, walks with walker .  Head: head normocephalic and atraumatic. Oropharynx benign Neck: supple with no carotid  bruits Cardiovascular: regular rate and rhythm, no murmurs  Neurologic Exam ; temporal artery on the left is  indurated and painful to touch -    Mental Status: Awake and fully alert. Oriented to place and time. Follows all commands. Speech and language normal.   Cranial Nerves: Fundoscopic exam with evidence of cataract-  Pupils equal, briskly reactive to light.  Extraocular movements full with coarse  saccades, not a  nystagmus. Unable to follow in smooth pursuit. Visual fields - she reports trouble seeing the floor.  Hearing intact and symmetric to finger snap. Facial sensation intact.  Face, tongue, palate move normally and symmetrically.  Neck flexion and extension normal.  Motor: Normal bulk and tone. Normal strength in all tested extremity muscles. No focal weakness, but reports foot drop when her sugar is low !  Sensory : loss of vibration in toes. Diabetic neuropathy.   Tingling and pin and needle dyeasthesias.  Coordination: Rapid alternating movements normal in all extremities.  Finger-to-nose  performed accurately bilaterally. Gait and Station: Arises from chair without difficulty. Stance is wide-based, she ambulates with assistance but relays on a walker outside of the office.   Reflexes: 2+ and symmetric. Toes downgoing.     ASSESSMENT:  Concerned temporal arteritis. Affecting vision in the left eye. Sed rate / c reactive protein  and referral for artery biopsy.    PLAN: as above - no changes in medication. No refills, see again in 6 month  .     Melvyn Novas, MD     Patient ID: [name], female   DOB: 06/15/1946, 72 y.o.   MRN: [id number]

## 2018-06-29 ENCOUNTER — Ambulatory Visit
Admission: RE | Admit: 2018-06-29 | Discharge: 2018-06-29 | Disposition: A | Discharge status: Home / Self Care | Payer: Medicare Other | Source: Ambulatory Visit | Attending: Family Medicine | Admitting: Family Medicine

## 2018-06-29 DIAGNOSIS — Z1231 Encounter for screening mammogram for malignant neoplasm of breast: Secondary | ICD-10-CM

## 2018-08-10 ENCOUNTER — Ambulatory Visit: Payer: Medicare Other | Admitting: Neurology

## 2018-08-21 ENCOUNTER — Telehealth: Payer: Self-pay | Admitting: Internal Medicine

## 2018-08-21 NOTE — Telephone Encounter (Signed)
Patient requests that Dr. Elvera Lennox send a recommendation that patient needs to be testing twice per day sent to [facility] Fax# [phone], otherwise insurance will only pay for testing 1 time per day. Patient needs RX for 90 day supply, testing 2 times per day sent to the above insurance with documentation discussed herein.

## 2018-08-23 NOTE — Telephone Encounter (Signed)
At last visit, she was checking only once a day. I cannot send documentation supporting th 2x a day until I see her. We can, however,  just try to send the Rx again with the 2x a day testing

## 2018-08-23 NOTE — Telephone Encounter (Signed)
Notified patient of message from Dr. Elvera Lennox, patient expressed understanding and agreement. No further questions.  Patient will wait until next appointment.

## 2018-08-23 NOTE — Telephone Encounter (Signed)
Please advise 

## 2018-09-05 ENCOUNTER — Ambulatory Visit (INDEPENDENT_AMBULATORY_CARE_PROVIDER_SITE_OTHER): Payer: Medicare Other | Admitting: Internal Medicine

## 2018-09-05 ENCOUNTER — Encounter: Payer: Self-pay | Admitting: Internal Medicine

## 2018-09-05 VITALS — BP 130/86 | HR 77 | Ht 60.0 in | Wt 264.0 lb

## 2018-09-05 DIAGNOSIS — Z23 Encounter for immunization: Secondary | ICD-10-CM | POA: Diagnosis not present

## 2018-09-05 DIAGNOSIS — T17308A Unspecified foreign body in larynx causing other injury, initial encounter: Secondary | ICD-10-CM

## 2018-09-05 DIAGNOSIS — E0842 Diabetes mellitus due to underlying condition with diabetic polyneuropathy: Secondary | ICD-10-CM

## 2018-09-05 DIAGNOSIS — E1142 Type 2 diabetes mellitus with diabetic polyneuropathy: Secondary | ICD-10-CM | POA: Diagnosis not present

## 2018-09-05 LAB — LIPID PANEL
CHOLESTEROL: 229 mg/dL — AB (ref 0–200)
HDL: 47.6 mg/dL (ref >39.00)
NonHDL: 181.18
TRIGLYCERIDES: 245 mg/dL — AB (ref 0.0–149.0)
Total CHOL/HDL Ratio: 5
VLDL: 49 mg/dL — ABNORMAL HIGH (ref 0.0–40.0)

## 2018-09-05 LAB — LDL CHOLESTEROL, DIRECT: Direct LDL: 167 mg/dL

## 2018-09-05 LAB — POCT GLYCOSYLATED HEMOGLOBIN (HGB A1C): Hemoglobin A1C: 5.8 % — AB (ref 4.0–5.6)

## 2018-09-05 NOTE — Patient Instructions (Addendum)
Please continue off Metformin.  Please let me know if the sugars are consistently <80 or >200.  Please stop at the lab.  Please return in 4 months with your sugar log.

## 2018-09-05 NOTE — Progress Notes (Signed)
Patient ID: Nicole Mccann, female   DOB: [birth year], 72 y.o.   MRN: [id number]   HPI: Nicole Mccann is a 72 y.o.-year-old female, returning for follow-up for DM2, dx in initially in 1965 with GDM, then again GDM in 1974, non-insulin-dependent, uncontrolled, with long-term complications (PN).  Last visit with me  4.5 months ago.    Last hemoglobin A1c was: Lab Results  Component Value Date   HGBA1C 6.3 (A) 04/18/2018   HGBA1C 6.4 12/12/2017   HGBA1C 6.2 02/09/2017   She is off diabetes medicines.  She was previously on metformin ER 750 mg daily but was only taking it if sugars were higher than 120.  She stopped due to abdominal pain and diarrhea and the sugars did not significantly worsen.  We did not restart the medicine.  Pt checks her sugars 1x a day: - am: 107-134 >> 107-130 >> 114-147, 154 - 2h after b'fast: n/c - before lunch: n/c >> 109-131 - 2h after lunch: n/c - before dinner: n/c - 2h after dinner: n/c - bedtime: n/c - nighttime: n/c Lowest sugar was 111 >> 107 >> 109; she has hypoglycemia awareness in the 70s. Highest: 140 >> 154  Glucometer: One Touch Verio  Pt's meals are: - Breakfast: cereals, yoghurt (Activia), pastries, tea, honey - Lunch: salad, leftover sandwich, fruit - Dinner: meat, veggies, potato - Snacks: Sweets, she has wine every day, occasionally sweet liquor  She was doing intermittent fasting at last visit, and we discussed about this at last OV >> rec'd to restart.  - no CKD, last BUN/creatinine:  Lab Results  Component Value Date   BUN 16 12/12/2017   BUN 18 11/09/2017   CREATININE 0.88 12/12/2017   CREATININE 0.79 11/09/2017   -+ HL; last set of lipids: Lab Results  Component Value Date   CHOL 245 (A) 02/09/2017   HDL 59 02/09/2017   LDLCALC 154 02/09/2017   LDLDIRECT 107.9 07/25/2012   TRIG 162 (A) 02/09/2017   CHOLHDL 6 07/25/2012  Not on a statin. - last eye exam was in 08/2018: No DR, + cataracts - no numbness and  tingling in her feet.  Pt has FH of DM in aunts.  She also has a history of OSA.  This summer: HAs >> temporal artery Bx negative. She developed anaphylaxis to Vancomycin while in the hospital.  She c/o choking in the last few months >> wonders if her thyroid needs to be checked.  ROS: Constitutional: + Weight gain/no weight loss, + fatigue, no subjective hyperthermia, no subjective hypothermia Eyes: no blurry vision, no xerophthalmia ENT: no sore throat, no nodules palpated in neck, no dysphagia, but does complain of choking with water, no odynophagia, no hoarseness Cardiovascular: no CP/no SOB/no palpitations/+ leg swelling Respiratory: no cough/no SOB/no wheezing Gastrointestinal: no N/no V/no D/no C/no acid reflux Musculoskeletal: no muscle aches/no joint aches Skin: no rashes, no hair loss Neurological: no tremors/no numbness/no tingling/no dizziness, + headache  I reviewed pt's medications, allergies, PMH, social hx, family hx, and changes were documented in the history of present illness. Otherwise, unchanged from my initial visit note.  Past Medical History:  Diagnosis Date  . Allergy   . Anxiety    Dr Madaline Guthrie  . Asthma   . Atypical seizure (HCC) 06/07/2012  . Depression    Dr Madaline Guthrie  . Diabetes mellitus   . DVT (deep venous thrombosis) (HCC)   . Headache(784.0)   . Hyperlipidemia   . Hypersomnia with sleep apnea, unspecified 05/22/2014  .  Low blood pressure   . Olfactory aura   . OSA on CPAP   . Shingles 07/2013   Past Surgical History:  Procedure Laterality Date  . APPENDECTOMY    . ARTERY BIOPSY Left 04/14/2018   Procedure: BIOPSY TEMPORAL ARTERY;  Surgeon: Sherren Kerns, MD;  Location: [facility] OR;  Service: Vascular;  Laterality: Left;  . BREAST EXCISIONAL BIOPSY Right 1998  . CHOLECYSTECTOMY    . COLONOSCOPY  2006   [facility]  . rotator cuff surgery    . TONSILLECTOMY      Social History   Socioeconomic History  . Marital status: Married    Spouse  name: Lars Mage  . Number of children: 2  . Years of education: College  . Highest education level: Not on file  Occupational History  . Not on file  Social Needs  . Financial resource strain: Not on file  . Food insecurity:    Worry: Not on file    Inability: Not on file  . Transportation needs:    Medical: Not on file    Non-medical: Not on file  Tobacco Use  . Smoking status: Never Smoker  . Smokeless tobacco: Never Used  Substance and Sexual Activity  . Alcohol use: Yes    Alcohol/week: 3.0 standard drinks    Types: 3 Glasses of wine per week    Comment: 1/2 glass wine daily  . Drug use: No  . Sexual activity: Never  Lifestyle  . Physical activity:    Days per week: Not on file    Minutes per session: Not on file  . Stress: Not on file  Relationships  . Social connections:    Talks on phone: Not on file    Gets together: Not on file    Attends religious service: Not on file    Active member of club or organization: Not on file    Attends meetings of clubs or organizations: Not on file    Relationship status: Not on file  . Intimate partner violence:    Fear of current or ex partner: Not on file    Emotionally abused: Not on file    Physically abused: Not on file    Forced sexual activity: Not on file  Other Topics Concern  . Not on file  Social History Narrative   Patient is married Lars Mage) and lives at home with her husband.   Patient has two adult children.   Patient is retired.   Patient has a college education.   Patient is right-handed.   Patient drinks one cup of tea daily.   Current Outpatient Medications on File Prior to Visit  Medication Sig Dispense Refill  . carbamazepine (TEGRETOL) 200 MG tablet Take 1 tablet (200 mg total) by mouth 3 (three) times daily. (Patient not taking: Reported on 06/12/2018) 270 tablet 3  . glucose blood test strip Use as instructed 2x a day - One Touch Verio 200 each 3  . OVER THE COUNTER MEDICATION Lidocaine 4% pain relieving  gel-patch. Apply one patch to affected area no more than 3 to 4 times daily     No current facility-administered medications on file prior to visit.    Allergies  Allergen Reactions  . Mushroom Ext Cmplx(Shiitake-Reishi-Mait) Anaphylaxis  . Shellfish Allergy Anaphylaxis  . Sulfonamide Derivatives Anaphylaxis  . Vancomycin Anaphylaxis  . Fluticasone-Salmeterol Other (See Comments)    Feels like something in the throat  . Butalbital-Aspirin-Caffeine Other (See Comments)    UNSPECIFIED REACTION   .  Montelukast Sodium Other (See Comments)    Feels like she's running out of breath  . Penicillins     UNSPECIFIED REACTION      Childhood allergy Has patient had a PCN reaction causing immediate rash, facial/tongue/throat swelling, SOB or lightheadedness with hypotension: Unknown Has patient had a PCN reaction causing severe rash involving mucus membranes or skin necrosis: Unknown Has patient had a PCN reaction that required hospitalization: No Has patient had a PCN reaction occurring within the last 10 years: No If all of the above answers are "NO", then may proceed with Cephalosporin use.    . Acetaminophen Other (See Comments)    Stomach discomfort  . Clarithromycin Nausea And Vomiting  . Iodinated Diagnostic Agents Other (See Comments)    Bad headache; no prep needed  . Levofloxacin Diarrhea and Nausea And Vomiting    Dizziness Believes it was due to a high dosage of this medication  . Orphenadrine Nausea And Vomiting   Family History  Problem Relation Age of Onset  . Stomach cancer Father   . Coronary artery disease Father   . Breast cancer Sister 53       breast cancer  . Diabetes Maternal Aunt   . Diabetes Paternal Aunt   . Prostate cancer Brother   . Cancer Daughter   . Stroke Neg Hx    PE: BP 130/86   Pulse 77   Ht 5' (1.524 m)   Wt 264 lb (119.7 kg)   SpO2 97%   BMI 51.56 kg/m  Wt Readings from Last 3 Encounters:  09/05/18 264 lb (119.7 kg)  06/12/18 264 lb  (119.7 kg)  05/03/18 269 lb 9.6 oz (122.3 kg)   Constitutional: overweight, in NAD Eyes: PERRLA, EOMI, no exophthalmos ENT: moist mucous membranes, no thyromegaly, no cervical lymphadenopathy Cardiovascular: RRR, No MRG Respiratory: CTA B Gastrointestinal: abdomen soft, NT, ND, BS+ Musculoskeletal: no deformities, strength intact in all 4 Skin: moist, warm, + stasis dermatitis B legs Neurological: no tremor with outstretched hands, DTR normal in all 4  ASSESSMENT: 1. 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  Obesity class III  4. Choking  PLAN:  1. Patient with long-standing, previously uncontrolled diabetes, now diet-controlled after she stopped metformin ER due to GI side effects.  Her latest HbA1c from last visit was 6.3%, which was excellent.  We did not restart metformin or any other diabetes medication at that time.  Her sugars were at goal in the morning but she still was not checking sugars later in the day and I strongly advised her to start doing so and gave her a log.  We discussed about improving diet to improve her insulin resistance and also to possibly return to intermittent fasting/time restricted feeding as she was doing before. - At this visit, her sugars are slightly higher in the morning, with occasional 140s, but the majority of the sugars are still at goal.  She also started to check later in the day and his sugars are at goal. Today, HbA1c is 5.8% (better!). - In this case, we do not need to start any medication.  She is especially opposed to retry metformin since she had significant GI side effects from it.  - I suggested to:  Patient Instructions  Please continue off Metformin.  Please let me know if the sugars are consistently <80 or >200.  Please stop at the lab.  Please return in 4 months with your sugar  log.  - continue checking sugars at different times of the day - check 1x a day, rotating checks -  advised for yearly eye exams >> she is UTD - + Flu shot today - Return to clinic in 4 mo with sugar log   2. 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  Obesity class III -Weight is stable since last visit (she had subjective weight gain, but this is not seen on our scale today), lost 5 pounds before then.  4. Choking - Previous TFTs were normal in 2017 - will recheck TFTs today - if normal or high TSH >> will check a thyroid U/S >> if normal >> may need Ba swallow or EGD-in that case, will defer to PCP.  Component     Latest Ref Rng & Units 09/05/2018  Cholesterol     0 - 200 mg/dL 098 (H)  Triglycerides     0.0 - 149.0 mg/dL 119.1 (H)  HDL Cholesterol     >39.00 mg/dL 47.82  VLDL     0.0 - 95.6 mg/dL 21.3 (H)  Total CHOL/HDL Ratio      5  NonHDL      181.18  Hemoglobin A1C     4.0 - 5.6 % 5.8 (A)  TSH     0.35 - 4.50 uIU/mL 2.87  T4,Free(Direct)     0.60 - 1.60 ng/dL 0.86  Triiodothyronine,Free,Serum     2.3 - 4.2 pg/mL 3.0  Direct LDL     mg/dL 578.4   TFTs are normal.  Cholesterol levels are high with elevated triglycerides and LDL very high above target.  We absolutely need a statin.  I will get in touch with her and see if she agrees to start.  Carlus Pavlov, MD PhD [facility]

## 2018-09-06 LAB — TSH: TSH: 2.87 u[IU]/mL (ref 0.35–4.50)

## 2018-09-06 LAB — T3, FREE: T3, Free: 3 pg/mL (ref 2.3–4.2)

## 2018-09-06 LAB — T4, FREE: FREE T4: 0.76 ng/dL (ref 0.60–1.60)

## 2018-10-10 ENCOUNTER — Ambulatory Visit: Payer: Medicare Other | Admitting: Internal Medicine

## 2018-11-17 ENCOUNTER — Telehealth: Payer: Self-pay | Admitting: Family Medicine

## 2018-11-17 NOTE — Telephone Encounter (Signed)
Grabbed forms from up front. Filling out patient/clinical information. Placed in providers folder for signature.

## 2018-11-17 NOTE — Telephone Encounter (Signed)
Patient's daughter came by the office to drop off a form for Dr. Patsy Lager to fill out for handicap plates (2). Placed in provider's tray. Patient requests this form to be completed by Monday. Advised patient that it may not be completed in such short notice but, that we can try for it.

## 2018-12-04 ENCOUNTER — Encounter: Payer: Self-pay | Admitting: Neurology

## 2018-12-07 ENCOUNTER — Ambulatory Visit (INDEPENDENT_AMBULATORY_CARE_PROVIDER_SITE_OTHER): Payer: Medicare Other | Admitting: Neurology

## 2018-12-07 ENCOUNTER — Encounter: Payer: Self-pay | Admitting: Neurology

## 2018-12-07 DIAGNOSIS — E1142 Type 2 diabetes mellitus with diabetic polyneuropathy: Secondary | ICD-10-CM | POA: Diagnosis not present

## 2018-12-07 DIAGNOSIS — G40219 Localization-related (focal) (partial) symptomatic epilepsy and epileptic syndromes with complex partial seizures, intractable, without status epilepticus: Secondary | ICD-10-CM | POA: Diagnosis not present

## 2018-12-07 NOTE — Progress Notes (Signed)
HPI: [name], 73 -year-old pleasant [place] lady returns for followup with her husband, and both are arguing about his health. Marland Kitchen  12-07-2018, Rv for [name] without acute concerns -BMI remains high, her mood is good.- but concerned for her husband- cognitive changes. Nicole Mccann , their daughter , has been concerned, too.  Her CPAP compliance in the treatment of obstructive sleep apnea has been excellent, she has used the machine 30 out of 30 days and 27 of these days over 4 hours, she has an average user time of 6 hours and 2 minutes her minimum pressure is set at 4 and maximum pressure at 12 cmH2O and she has 3 cm expiratory pressure relief. She does have major air leaks but these are also created by her habits to let the machine run when she leaves for the bathroom.   The machine does switch itself off after several minutes.  The 95th percentile pressure is 11 cmH2O and the residual AHI is 2.9.  This is a good resolution with some residual obstructive sleep apnea is only.  The machine could not detect central apneas . She likes nasal pillows, airFit P10 . She sleeps deeper and is more refreshed after CPAP use.     09-2018.She had a negative biopsy for temporal arteritis, and did not need to take steroids.The patient had a very unpleasant surprise after her temporal artery biopsy when she had been brought to the recovery room.  She reports that she was considered ready for discharge, wanted to go to the bathroom and then her nurse noted that she was severely flushed face chest and back.  She also began wheezing.  It turns out that she was treated with an antibiotic she is allergic to, and she had an anaphylactic reaction.  She was given steroids and Benadryl and an inhaler. 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 She has hair roots growing close to the the incision site, or as she said" embedded".  She recovered from her  Headaches, weaned off Tegretol.    04-12-2018, Rv with urgent appointment Ms.  Nicole Mccann reports having a new , sudden attack like headpain or facial pain-   This affects the left temple, feels electric 10 out of 10, and higher in intensity. Sharp eye pain on the left only, feeling her vision has not returnedto normal.  In the same timeframe she felt heat wave coming over her, her stomach felt squizzy, she felt as if having indigestion.  She nearly passed out last Friday.  She admits to not hydrating well, her BP was in normal range and heart rate regular.  HPI: [name], 58 -year-old pleasant [place] lady returns for followup.     04-12-2018, She had more falls, but none associated with a seizure or olfactory aura.   Continues medication -  She was so dizzy she felt she fall , but she has less headaches and is less fatigued and sleepy on CPAP. She is highly compliant.  The patient is a 97% compliance on her last download 29 out of 30 days was 5 hours and 45 minutes of average use per night she is on an auto set 4-12 cm water pressure with 3 cm EPR her residual AHI is 1.7 which is an excellent result Epworth remains at 10 points fatigue at only 20 points.  Initial visit : 06-28-12 Patient was seen in the EMU at [facility] and diagnosed with an olfactory aura, no EEG abnormalities. The patient was given Dilantin and finally  given Keppra IV, and developed a DVT in the left upper extremity after phenytoin extravasated.  The Keppra was well tolerated. She has only once a week any aura, not daily.  November 21, 2012. Patient developed depression, possibly worseing on Keppra which we weaned off. She is no longer taking Coumadin for the presumed upper extremity DVT ([facility], [facility])  perhaps rather a thrombophlebitis. She was changed to Lamictal and was doing well on 100 mg XR daily in the AM  after a slow titration.  She will progress to 100 mg now.   05/21/13-CM  followup visit today and patient has had 2 auras  since last seen. Her Lamictal dose is currently at 150  daily instead of 200 as Nicole Mccann had ordered. Apparently she did not understand that she was supposed to continue to titrate to 200. She denies any side effects to the medication. He has no new neurologic complaints. She is currently receiving lumbar epidural steroids by Nicole Mccann for her low back pain.  05-22-14 - Psychiatrist recommend Wellbutrin, but changed to Zoloft after seizure concerns were discussed. She has better mood on Lamictal,  He was sad " blue " on Keppra.  She reports she was hospitalized in [place] , August 28 2013, after a seizure or syncope on a cruise. Her husband fainted  and needed hospital admission , too. Both Nicole Mccann spent their [employer] in a hospital in [place]! Her husband is concerned about her loud snoring, she is obese and has been very fatigued. She denies waking up with headaches. Both have witnessed each others apneas.    Interval history from 06/07/2017. I have the pleasure of seeing [name]- she has been and 97% compliance CPAP user with 6 hours and 36 minutes of average user time, using an auto set CPAP between 4 and 12 cm water with 3 cm EPR and her residual AHI of 1.8.  There is no evidence of central apneas emerging. She does have some air leaks, but they have not affected her apnea count negatively. If she is comfortable with the current interface I would not necessarily change it. In addition she has advanced to a walker from a cane. Her back pain has improved the patient had a injection into the lower back by Nicole Mccann, sacroiliac. Dr Metta Mccann is her pain doctor.   Psychiatrically improved, [name],  less depressed, but she discontinued her prescription meds- all of them after she  was very sick following a dental extraction with abcess, she reported, from January- March 2018, as a side effect of antibiotics(.levofloxasin).   She appreciated a referral to [facility] , [facility]cc[facility].    Montreal Cognitive Assessment  06/03/2016   Visuospatial/ Executive (0/5) 5  Naming (0/3) 3  Attention: Read list of digits (0/2) 2  Attention: Read list of letters (0/1) 1  Attention: Serial 7 subtraction starting at 100 (0/3) 3  Language: Repeat phrase (0/2) 1  Language : Fluency (0/1) 0  Abstraction (0/2) 2  Delayed Recall (0/5) 4  Orientation (0/6) 5  Total 26  Adjusted Score (based on education) 26   VS - blood pressure.  Vitals:   12/07/18 1052  BP: 128/76  Pulse: 71     Physical Exam General: She is seated, not  in any evident distress, walks with walker, happy.. smiling and reporting that she has no new concerns.  Head: normocephalic and atraumatic.Oropharynx benign Neck: supple with no carotid  bruits Cardiovascular: regular rate and rhythm,  no murmurs Mental Status: Awake and fully alert. Oriented to place and time.  Follows all commands. Speech and language normal.   Cranial Nerves: Fundoscopic exam with evidence of cataract-  Pupils equal, briskly reactive to light.  Extraocular movements full with coarse saccades, not nystagmus. Unable to follow in smooth pursuit. Visual fields - she reports trouble seeing the floor.  Hearing intact and symmetric to finger snap. Facial sensation intact.  Face, tongue, palate move normally and symmetrically.  Neck flexion and extension normal.  Motor: Normal bulk and tone. Normal strength in all tested extremity muscles. No focal weakness, but reports foot drop when her sugar is low !  Sensory : loss of vibration in toes. Diabetic neuropathy.  Tingling and pin and needle dyseasthesias.  Coordination: Rapid alternating movements normal in all extremities.  Finger-to-nose performed accurately bilaterally. Gait and Station: Arises from chair with difficulty. Braces herself. Stance is wide-based, she ambulates with assistance but relays on a walker outside of the office.   Reflexes: 2+ and symmetric. Toes downgoing.     ASSESSMENT:  Highly compliant CPAP user, in the  treatment of OSA. Nasal pillows are well tolerated.  Epworth 5/ 24/    PLAN: continue CPAP use. Yearly RV       Melvyn Novas, MD      Patient ID: Nicole Mccann, female   DOB: 1945-12-10, 73 y.o.   MRN: [id number]

## 2019-01-09 ENCOUNTER — Encounter: Payer: Self-pay | Admitting: Internal Medicine

## 2019-01-09 ENCOUNTER — Ambulatory Visit (INDEPENDENT_AMBULATORY_CARE_PROVIDER_SITE_OTHER): Payer: Medicare Other | Admitting: Internal Medicine

## 2019-01-09 VITALS — BP 128/80 | HR 77 | Ht 60.0 in | Wt 271.0 lb

## 2019-01-09 DIAGNOSIS — E0842 Diabetes mellitus due to underlying condition with diabetic polyneuropathy: Secondary | ICD-10-CM | POA: Diagnosis not present

## 2019-01-09 DIAGNOSIS — E1142 Type 2 diabetes mellitus with diabetic polyneuropathy: Secondary | ICD-10-CM | POA: Diagnosis not present

## 2019-01-09 LAB — POCT GLYCOSYLATED HEMOGLOBIN (HGB A1C): HEMOGLOBIN A1C: 6.3 % — AB (ref 4.0–5.6)

## 2019-01-09 NOTE — Addendum Note (Signed)
Addended by: Darliss Ridgel I on: 01/09/2019 03:47 PM   Modules accepted: Orders

## 2019-01-09 NOTE — Patient Instructions (Addendum)
Please continue off diabetes medications.  Please schedule an appt with Nicole Mccann with nutrition.  Please return in 6 months with your sugar log.

## 2019-01-09 NOTE — Progress Notes (Signed)
Patient ID: Nicole Mccann, female   DOB: 1946-10-24, 73 y.o.   MRN: [id number]   HPI: Nicole Mccann is a 73 y.o.-year-old female, returning for follow-up for DM2, dx in initially in 1965 with GDM, then again GDM in 1974, non-insulin-dependent, uncontrolled, with long-term complications (PN).  Last visit 4 months ago.    She is frustrated that she is gaining weight but she does feel that she is not eating very well.  She usually eats 2 meals a day.  Last hemoglobin A1c was: Lab Results  Component Value Date   HGBA1C 5.8 (A) 09/05/2018   HGBA1C 6.3 (A) 04/18/2018   HGBA1C 6.4 12/12/2017   She is off diabetes medicines.   She was previously on metformin ER 750 mg daily but was only taking it if sugars were higher than 120.  She stopped due to abdominal pain and diarrhea and the sugars did not significantly worsen.  We did not restart the medicine.  Pt checks her sugars once a day: - am: 107-134 >> 107-130 >> 114-147, 154 >> 117, 126-144, 158 - 2h after b'fast: n/c - before lunch: n/c >> 109-131 >> 104, 123-133 - 2h after lunch: n/c - before dinner: n/c >> 141 - 2h after dinner: n/c - bedtime: n/c - nighttime: n/c Lowest sugar was 109 >> 90; she has hypoglycemia awareness in the 70s. Highest: 140 >> 154 >> 158.  Glucometer: One Touch Verio  Pt's meals are: - Breakfast: cereals, yoghurt (Activia), pastries, tea, honey - Lunch: salad, leftover sandwich, fruit - Dinner: meat, veggies, potato - Snacks: Sweets, she has wine every day, occasionally sweet liquor  She was doing intermittent fasting before and I recommended that she restart doing this.  -No CKD, last BUN/creatinine:  Lab Results  Component Value Date   BUN 16 12/12/2017   BUN 18 11/09/2017   CREATININE 0.88 12/12/2017   CREATININE 0.79 11/09/2017   -+ HL; last set of lipids: Lab Results  Component Value Date   CHOL 229 (H) 09/05/2018   HDL 47.60 09/05/2018   LDLCALC 154 02/09/2017   LDLDIRECT 167.0  09/05/2018   TRIG 245.0 (H) 09/05/2018   CHOLHDL 5 09/05/2018  Not on a statin. - last eye exam was in 08/2018: No DR, + cataracts -She denies numbness and tingling in her feet.  Pt has FH of DM in aunts.  She also has a history of OSA-uses a CPAP.  In summer 2019, she had hAs >> temporal artery Bx negative. She developed anaphylaxis to Vancomycin while in the hospital.  At last visit, she was complaining of choking and wondering whether her thyroid needed to be checked.  At that time, her TFTs returned normal.  We discussed about checking a thyroid ultrasound at this visit if the choking still persist. Now much better.  She feels that the initial problem was related to changing her CPAP machine.  ROS: Constitutional: + Weight gain/no weight loss, no fatigue, no subjective hyperthermia, no subjective hypothermia Eyes: no blurry vision, no xerophthalmia ENT: no sore throat, no nodules palpated in neck, no dysphagia, no odynophagia, no hoarseness Cardiovascular: no CP/no SOB/no palpitations/no leg swelling Respiratory: no cough/no SOB/no wheezing Gastrointestinal: no N/no V/no D/no C/no acid reflux Musculoskeletal: no muscle aches/no joint aches Skin: no rashes, no hair loss Neurological: no tremors/no numbness/no tingling/no dizziness  I reviewed pt's medications, allergies, PMH, social hx, family hx, and changes were documented in the history of present illness. Otherwise, unchanged from my initial visit note.  Past Medical History:  Diagnosis Date  . Allergy   . Anxiety    Dr Madaline Guthrie  . Asthma   . Atypical seizure (HCC) 06/07/2012  . Depression    Dr Madaline Guthrie  . Diabetes mellitus   . DVT (deep venous thrombosis) (HCC)   . Headache(784.0)   . Hyperlipidemia   . Hypersomnia with sleep apnea, unspecified 05/22/2014  . Low blood pressure   . Olfactory aura   . OSA on CPAP   . Shingles 07/2013   Past Surgical History:  Procedure Laterality Date  . APPENDECTOMY    . ARTERY  BIOPSY Left 04/14/2018   Procedure: BIOPSY TEMPORAL ARTERY;  Surgeon: Sherren Kerns, MD;  [facility];  Service: Vascular;  Laterality: Left;  . BREAST EXCISIONAL BIOPSY Right 1998  . CHOLECYSTECTOMY    . COLONOSCOPY  2006   [facility]  . rotator cuff surgery    . TONSILLECTOMY      Social History   Socioeconomic History  . Marital status: Married    Spouse name: Nicole Mccann  . Number of children: 2  . Years of education: College  . Highest education level: Not on file  Occupational History  . Not on file  Social Needs  . Financial resource strain: Not on file  . Food insecurity:    Worry: Not on file    Inability: Not on file  . Transportation needs:    Medical: Not on file    Non-medical: Not on file  Tobacco Use  . Smoking status: Never Smoker  . Smokeless tobacco: Never Used  Substance and Sexual Activity  . Alcohol use: Yes    Alcohol/week: 3.0 standard drinks    Types: 3 Glasses of wine per week    Comment: 1/2 glass wine daily  . Drug use: No  . Sexual activity: Never  Lifestyle  . Physical activity:    Days per week: Not on file    Minutes per session: Not on file  . Stress: Not on file  Relationships  . Social connections:    Talks on phone: Not on file    Gets together: Not on file    Attends religious service: Not on file    Active member of club or organization: Not on file    Attends meetings of clubs or organizations: Not on file    Relationship status: Not on file  . Intimate partner violence:    Fear of current or ex partner: Not on file    Emotionally abused: Not on file    Physically abused: Not on file    Forced sexual activity: Not on file  Other Topics Concern  . Not on file  Social History Narrative   Patient is married Nicole Mccann) and lives at home with her husband.   Patient has two adult children.   Patient is retired.   Patient has a college education.   Patient is right-handed.   Patient drinks one cup of tea daily.   Current  Outpatient Medications on File Prior to Visit  Medication Sig Dispense Refill  . glucose blood test strip Use as instructed 2x a day - One Touch Verio 200 each 3  . OVER THE COUNTER MEDICATION Lidocaine 4% pain relieving gel-patch. Apply one patch to affected area no more than 3 to 4 times daily     No current facility-administered medications on file prior to visit.    Allergies  Allergen Reactions  . Mushroom Ext Cmplx(Shiitake-Reishi-Mait) Anaphylaxis  .  Shellfish Allergy Anaphylaxis  . Sulfonamide Derivatives Anaphylaxis  . Vancomycin Anaphylaxis  . Fluticasone-Salmeterol Other (See Comments)    Feels like something in the throat  . Butalbital-Aspirin-Caffeine Other (See Comments)    UNSPECIFIED REACTION   . Montelukast Sodium Other (See Comments)    Feels like she's running out of breath  . Penicillins     UNSPECIFIED REACTION      Childhood allergy Has patient had a PCN reaction causing immediate rash, facial/tongue/throat swelling, SOB or lightheadedness with hypotension: Unknown Has patient had a PCN reaction causing severe rash involving mucus membranes or skin necrosis: Unknown Has patient had a PCN reaction that required hospitalization: No Has patient had a PCN reaction occurring within the last 10 years: No If all of the above answers are "NO", then may proceed with Cephalosporin use.    . Acetaminophen Other (See Comments)    Stomach discomfort  . Clarithromycin Nausea And Vomiting  . Iodinated Diagnostic Agents Other (See Comments)    Bad headache; no prep needed  . Levofloxacin Diarrhea and Nausea And Vomiting    Dizziness Believes it was due to a high dosage of this medication  . Orphenadrine Nausea And Vomiting   Family History  Problem Relation Age of Onset  . Stomach cancer Father   . Coronary artery disease Father   . Breast cancer Sister 85       breast cancer  . Diabetes Maternal Aunt   . Diabetes Paternal Aunt   . Prostate cancer Brother   .  Cancer Daughter   . Stroke Neg Hx    PE: BP 128/80   Pulse 77   Ht 5' (1.524 m) Comment: measured  Wt 271 lb (122.9 kg)   SpO2 96%   BMI 52.93 kg/m  Wt Readings from Last 3 Encounters:  01/09/19 271 lb (122.9 kg)  12/07/18 270 lb (122.5 kg)  09/05/18 264 lb (119.7 kg)   Constitutional: overweight, in NAD Eyes: PERRLA, EOMI, no exophthalmos ENT: moist mucous membranes, no thyromegaly, no cervical lymphadenopathy Cardiovascular: RRR, No MRG Respiratory: CTA B Gastrointestinal: abdomen soft, NT, ND, BS+ Musculoskeletal: no deformities, strength intact in all 4 Skin: moist, warm, + stasis dermatitis B legs Neurological: no tremor with outstretched hands, DTR normal in all 4  ASSESSMENT: 1. 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  Obesity class III  4. Choking  PLAN:  1. Patient with longstanding, previously uncontrolled diabetes, now diet controlled after she stopped metformin ER due to GI side effects.  Latest HbA1c was excellent, at 5.8%. - At this visit, her sugars are at or close to goal, but they are in the higher end of the normal range. - We discussed about the need to improve her diet and she accepts a referral to nutrition.  We will not make any other changes in her regimen for now - At last visit, she had a very high LDL and I recommended to start a statin.  She refuses to use this and would like to try to lower her cholesterol with diet first. - I suggested to:  Patient Instructions  Please continue off diabetes medications.  Please return in 6  months with your sugar log.  - today, HbA1c is 6.3% (higher) - continue checking sugars at different times of the day - check 1x a day, rotating checks - advised for yearly eye exams >> she is UTD - Return to clinic in 6 mo with  sugar log   2. 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  Obesity class III - gained 7 lbs since last OV - We will refer her to  nutrition as discussed  4. Choking - TFTs normal at last OV - since this persists >> will check a thyroid U/S >> may need Ba swallow or EGD-in that case, will defer to PCP. -At this visit, she describes that this is much better.  Carlus Pavlov, MD PhD [facility]

## 2019-03-14 ENCOUNTER — Telehealth: Payer: Self-pay | Admitting: Family Medicine

## 2019-03-14 NOTE — Telephone Encounter (Signed)
Called patient to schedule AWV. Patient did not answer. Will try to call back at a later time. SF 

## 2019-06-13 ENCOUNTER — Telehealth: Payer: Self-pay

## 2019-06-13 ENCOUNTER — Telehealth: Payer: Self-pay | Admitting: Family Medicine

## 2019-06-13 NOTE — Telephone Encounter (Signed)
Pt dtr called after hours answering service to request a letter for a "power lifting" and "reclining chair". States pt insurance company informed her that with the doctor's approval, it would help with cost. Dr. Cruzita Lederer unavailable until August 17. Message routed to on call provider, Dr. Kelton Pillar.

## 2019-06-13 NOTE — Telephone Encounter (Signed)
Called dtr and informed of Dr. Quin Hoop advice. Verbalized acceptance and understanding.

## 2019-06-13 NOTE — Telephone Encounter (Signed)
Copied from [place] [id number]. Topic: General - Other >> Jun 13, 2019  1:32 PM Keene Breath wrote: Reason for CRM: Patient's daughter is calling to request an order for a feet lift mechanism and a recliner.  Please advise and call patient back to let her know if this can be done as soon as possible.  CB# [phone].  Daughter's name is [name].

## 2019-06-14 ENCOUNTER — Telehealth: Payer: Self-pay | Admitting: *Deleted

## 2019-06-14 ENCOUNTER — Other Ambulatory Visit: Payer: Self-pay | Admitting: Family Medicine

## 2019-06-14 NOTE — Telephone Encounter (Signed)
Nicole Mccann She is using a hospital bed at home right now-they are looking for a lift chair with a feet lift mechanism I will write letter to this effect and put in her mychart account

## 2019-06-14 NOTE — Telephone Encounter (Signed)
Spoke with daughter.  Letter stamped and emailed to [email or web address]

## 2019-06-14 NOTE — Telephone Encounter (Signed)
Left message with patient to have her daughter call me back.  Copied from [place] [phone]. Topic: General - Other >> Jun 13, 2019  1:32 PM Keene Breath wrote: Reason for CRM: Patient's daughter is calling to request an order for a feet lift mechanism and a recliner.  Please advise and call patient back to let her know if this can be done as soon as possible.  CB# [phone].  Daughter's name is [name]. >> Jun 14, 2019 12:21 PM Sharene Skeans wrote: [name] calls back to advised she received the letter on mychart but it has no signature. Pt needs another with a signature on mychart or mailed.

## 2019-07-10 ENCOUNTER — Encounter: Payer: Self-pay | Admitting: Internal Medicine

## 2019-07-10 ENCOUNTER — Other Ambulatory Visit: Payer: Self-pay

## 2019-07-10 ENCOUNTER — Ambulatory Visit (INDEPENDENT_AMBULATORY_CARE_PROVIDER_SITE_OTHER): Payer: Medicare Other | Admitting: Internal Medicine

## 2019-07-10 DIAGNOSIS — E1142 Type 2 diabetes mellitus with diabetic polyneuropathy: Secondary | ICD-10-CM

## 2019-07-10 DIAGNOSIS — E0842 Diabetes mellitus due to underlying condition with diabetic polyneuropathy: Secondary | ICD-10-CM | POA: Diagnosis not present

## 2019-07-10 DIAGNOSIS — E782 Mixed hyperlipidemia: Secondary | ICD-10-CM

## 2019-07-10 HISTORY — DX: Mixed hyperlipidemia: E78.2

## 2019-07-10 NOTE — Progress Notes (Signed)
Patient ID: Nicole BarmanBlanca R Maalouf, female   DOB: 04-27-1946, 73 y.o.   MRN: [id number]   Patient location: Home My location: Office  Referring Provider: Pearline Cablesopland, Jessica C, MD  I connected with the patient and her daughter on 07/10/19 at  2:55 PM EDT by a video enabled telemedicine application and verified that I am speaking with the correct person.   I discussed the limitations of evaluation and management by telemedicine and the availability of in person appointments. The patient expressed understanding and agreed to proceed.   Details of the encounter are shown below.  HPI: Nicole Mccann is a 73 y.o.-year-old female, presenting for follow-up for DM2, dx in initially in 1965 with GDM, then again GDM in 1974, non-insulin-dependent, now controlled, with long-term complications (PN).  Last visit 6 months ago.  Reviewed HbA1c levels: Lab Results  Component Value Date   HGBA1C 6.3 (A) 01/09/2019   HGBA1C 5.8 (A) 09/05/2018   HGBA1C 6.3 (A) 04/18/2018   Her diabetes is controlled by diet. She did take Metformin few times - developed gas, AP. She was previously on metformin ER 750 mg daily but was only taking it if sugars were higher than 120.  She stopped due to abdominal pain and diarrhea and the sugars did not significantly worsen.  We did not restart the medicine.  Pt checks her sugars once a day: - am: 114-147, 154 >> 117, 126-144, 158 >> 114-142 - 2h after b'fast: n/c - before lunch: n/c >> 109-131 >> 104, 123-133 >> 104 - 2h after lunch: n/c >> 133 - before dinner: n/c >> 141 >> 108-142 - 2h after dinner: n/c >> 127-139 - bedtime: n/c - nighttime: n/c Lowest sugar was 90 >> 108; she has hypoglycemia awareness in the 70s. Highest: 158 >> 142.  Glucometer: One Touch Verio  Pt's meals are: - Breakfast: cereals, yoghurt (Activia), pastries, tea, honey - Lunch: salad, leftover sandwich, fruit - Dinner: meat, veggies, potato - Snacks: Sweets, she has wine every day,  occasionally sweet liquor  She was doing intermittent fasting before and at last visit I recommended that she restarted this.  -No CKD, last BUN/creatinine:  Lab Results  Component Value Date   BUN 16 12/12/2017   BUN 18 11/09/2017   CREATININE 0.88 12/12/2017   CREATININE 0.79 11/09/2017   -+ HL; last set of lipids: Lab Results  Component Value Date   CHOL 229 (H) 09/05/2018   HDL 47.60 09/05/2018   LDLCALC 154 02/09/2017   LDLDIRECT 167.0 09/05/2018   TRIG 245.0 (H) 09/05/2018   CHOLHDL 5 09/05/2018  She is not on a statin. - last eye exam was in 08/2018: No DR, + cataracts -no numbness and tingling in her feet.  Pt has FH of DM in aunts.  She also has a history of OSA-uses a CPAP  In summer 2019, she had hAs >> temporal artery Bx negative. She developed anaphylaxis to Vancomycin while in the hospital.  ROS: Constitutional: no weight gain/+ weight loss, no fatigue, no subjective hyperthermia, no subjective hypothermia Eyes: no blurry vision, no xerophthalmia ENT: no sore throat, no nodules palpated in neck, no dysphagia, no odynophagia, no hoarseness Cardiovascular: no CP/no SOB/no palpitations/no leg swelling Respiratory: no cough/no SOB/no wheezing Gastrointestinal: no N/no V/no D/no C/no acid reflux Musculoskeletal: no muscle aches/no joint aches Skin: no rashes, no hair loss Neurological: no tremors/no numbness/no tingling/no dizziness, +HA  I reviewed pt's medications, allergies, PMH, social hx, family hx, and changes were documented in the history  of present illness. Otherwise, unchanged from my initial visit note.  Past Medical History:  Diagnosis Date  . Allergy   . Anxiety    Dr Madaline Guthrie  . Asthma   . Atypical seizure (HCC) 06/07/2012  . Depression    Dr Madaline Guthrie  . Diabetes mellitus   . DVT (deep venous thrombosis) (HCC)   . Headache(784.0)   . Hyperlipidemia   . Hypersomnia with sleep apnea, unspecified 05/22/2014  . Low blood pressure   . Olfactory  aura   . OSA on CPAP   . Shingles 07/2013   Past Surgical History:  Procedure Laterality Date  . APPENDECTOMY    . ARTERY BIOPSY Left 04/14/2018   Procedure: BIOPSY TEMPORAL ARTERY;  Surgeon: Sherren Kerns, MD;  Location: [facility] OR;  Service: Vascular;  Laterality: Left;  . BREAST EXCISIONAL BIOPSY Right 1998  . CHOLECYSTECTOMY    . COLONOSCOPY  2006   [facility]  . rotator cuff surgery    . TONSILLECTOMY      Social History   Socioeconomic History  . Marital status: Married    Spouse name: Lars Mage  . Number of children: 2  . Years of education: College  . Highest education level: Not on file  Occupational History  . Not on file  Social Needs  . Financial resource strain: Not on file  . Food insecurity    Worry: Not on file    Inability: Not on file  . Transportation needs    Medical: Not on file    Non-medical: Not on file  Tobacco Use  . Smoking status: Never Smoker  . Smokeless tobacco: Never Used  Substance and Sexual Activity  . Alcohol use: Yes    Alcohol/week: 3.0 standard drinks    Types: 3 Glasses of wine per week    Comment: 1/2 glass wine daily  . Drug use: No  . Sexual activity: Never  Lifestyle  . Physical activity    Days per week: Not on file    Minutes per session: Not on file  . Stress: Not on file  Relationships  . Social Musician on phone: Not on file    Gets together: Not on file    Attends religious service: Not on file    Active member of club or organization: Not on file    Attends meetings of clubs or organizations: Not on file    Relationship status: Not on file  . Intimate partner violence    Fear of current or ex partner: Not on file    Emotionally abused: Not on file    Physically abused: Not on file    Forced sexual activity: Not on file  Other Topics Concern  . Not on file  Social History Narrative   Patient is married Lars Mage) and lives at home with her husband.   Patient has two adult children.   Patient is  retired.   Patient has a college education.   Patient is right-handed.   Patient drinks one cup of tea daily.   Current Outpatient Medications on File Prior to Visit  Medication Sig Dispense Refill  . glucose blood test strip Use as instructed 2x a day - One Touch Verio 200 each 3  . OVER THE COUNTER MEDICATION Lidocaine 4% pain relieving gel-patch. Apply one patch to affected area no more than 3 to 4 times daily     No current facility-administered medications on file prior to visit.    Allergies  Allergen Reactions  . Mushroom Ext Cmplx(Shiitake-Reishi-Mait) Anaphylaxis  . Shellfish Allergy Anaphylaxis  . Sulfonamide Derivatives Anaphylaxis  . Vancomycin Anaphylaxis  . Fluticasone-Salmeterol Other (See Comments)    Feels like something in the throat  . Butalbital-Aspirin-Caffeine Other (See Comments)    UNSPECIFIED REACTION   . Montelukast Sodium Other (See Comments)    Feels like she's running out of breath  . Penicillins     UNSPECIFIED REACTION      Childhood allergy Has patient had a PCN reaction causing immediate rash, facial/tongue/throat swelling, SOB or lightheadedness with hypotension: Unknown Has patient had a PCN reaction causing severe rash involving mucus membranes or skin necrosis: Unknown Has patient had a PCN reaction that required hospitalization: No Has patient had a PCN reaction occurring within the last 10 years: No If all of the above answers are "NO", then may proceed with Cephalosporin use.    . Acetaminophen Other (See Comments)    Stomach discomfort  . Clarithromycin Nausea And Vomiting  . Iodinated Diagnostic Agents Other (See Comments)    Bad headache; no prep needed  . Levofloxacin Diarrhea and Nausea And Vomiting    Dizziness Believes it was due to a high dosage of this medication  . Orphenadrine Nausea And Vomiting   Family History  Problem Relation Age of Onset  . Stomach cancer Father   . Coronary artery disease Father   . Breast cancer  Sister [id number]       breast cancer  . Diabetes Maternal Aunt   . Diabetes Paternal Aunt   . Prostate cancer Brother   . Cancer Daughter   . Stroke Neg Hx    PE: There were no vitals taken for this visit. Wt Readings from Last 3 Encounters:  01/09/19 271 lb (122.9 kg)  12/07/18 270 lb (122.5 kg)  09/05/18 264 lb (119.7 kg)   Constitutional:  in NAD  The physical exam was not performed (virtual visit).  ASSESSMENT: 1. 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  Obesity class III  4. HL  PLAN:  1. Patient with longstanding, previously uncontrolled type 2 diabetes, now diet controlled after stopping metformin ER due to GI side effects.  Latest HbA1c was slightly higher, at 6.3%, but still very good. - At last visit we discussed about the need to improve her diet and I referred her to nutrition. -At this visit, per review of her sugar log, her sugars remain at goal with only slightly higher CBGs than target, in the 140s, in the morning.  She occasionally takes a tablet of metformin when she feels that her sugars are higher, which is ok to continue.  She cannot take this consistently due to abdominal pain and gas. -I do not feel that she needs a change in regimen for now - I suggested to:  Patient Instructions  Please continue off diabetes medications. You can take a Metformin ER 750 as needed.  Please return in 6 months with your sugar log.  - we would check her HbA1c when she returns to the clinic - advised to check sugars at different times of the day - 1x a day, rotating check times - advised for yearly eye exams >> she is UTD - She will need labs when she returns to the clinic or at next visit with PCP, which ever is first - return to clinic in 6 months   2. PN -Due to diabetes -Sees neurology -Stable  3.  Obesity class III -Gained 7 pounds before last visit,  Lost few lbs since last visit per her scale at home -I did  refer her to nutrition in the past but did not have this appointment yet  4. HL - Reviewed latest lipid panel from 08/2018: LDL very high, triglycerides high, HDL normal Lab Results  Component Value Date   CHOL 229 (H) 09/05/2018   HDL 47.60 09/05/2018   LDLCALC 154 02/09/2017   LDLDIRECT 167.0 09/05/2018   TRIG 245.0 (H) 09/05/2018   CHOLHDL 5 09/05/2018  -She refused a statin in the past   Philemon Kingdom, MD PhD [facility] Endocrinology

## 2019-07-10 NOTE — Patient Instructions (Addendum)
Please continue off diabetes medications. You can take a Metformin ER 750 as needed.  Please return in 6 months with your sugar log.

## 2019-10-09 ENCOUNTER — Other Ambulatory Visit: Payer: Self-pay | Admitting: Family Medicine

## 2019-10-09 DIAGNOSIS — Z1231 Encounter for screening mammogram for malignant neoplasm of breast: Secondary | ICD-10-CM

## 2019-10-11 ENCOUNTER — Other Ambulatory Visit: Payer: Self-pay

## 2019-10-11 ENCOUNTER — Ambulatory Visit
Admission: RE | Admit: 2019-10-11 | Discharge: 2019-10-11 | Disposition: A | Discharge status: Home / Self Care | Payer: Medicare Other | Source: Ambulatory Visit | Attending: Family Medicine | Admitting: Family Medicine

## 2019-10-11 DIAGNOSIS — Z1231 Encounter for screening mammogram for malignant neoplasm of breast: Secondary | ICD-10-CM

## 2019-12-31 ENCOUNTER — Ambulatory Visit: Payer: Medicare Other | Admitting: Neurology

## 2020-01-28 ENCOUNTER — Telehealth: Payer: Self-pay

## 2020-02-21 ENCOUNTER — Encounter: Payer: Self-pay | Admitting: Family Medicine

## 2020-02-21 MED ORDER — TRAMADOL HCL 50 MG PO TABS: 50.0000 mg | ORAL_TABLET | Freq: Two times a day (BID) | ORAL | 0 refills | Status: AC | PRN

## 2020-02-29 ENCOUNTER — Encounter: Payer: Self-pay | Admitting: Family Medicine

## 2020-05-02 ENCOUNTER — Ambulatory Visit (INDEPENDENT_AMBULATORY_CARE_PROVIDER_SITE_OTHER): Payer: Medicare Other | Admitting: Internal Medicine

## 2020-05-02 ENCOUNTER — Other Ambulatory Visit: Payer: Self-pay

## 2020-05-02 ENCOUNTER — Encounter: Payer: Self-pay | Admitting: Internal Medicine

## 2020-05-02 VITALS — BP 120/78 | HR 82 | Ht 60.0 in | Wt 285.0 lb

## 2020-05-02 DIAGNOSIS — E1142 Type 2 diabetes mellitus with diabetic polyneuropathy: Secondary | ICD-10-CM | POA: Diagnosis not present

## 2020-05-02 DIAGNOSIS — E782 Mixed hyperlipidemia: Secondary | ICD-10-CM

## 2020-05-02 DIAGNOSIS — E0842 Diabetes mellitus due to underlying condition with diabetic polyneuropathy: Secondary | ICD-10-CM | POA: Diagnosis not present

## 2020-05-02 LAB — LDL CHOLESTEROL, DIRECT: Direct LDL: 173 mg/dL

## 2020-05-02 LAB — POCT GLYCOSYLATED HEMOGLOBIN (HGB A1C): Hemoglobin A1C: 6.6 % — AB (ref 4.0–5.6)

## 2020-05-02 LAB — LIPID PANEL
Cholesterol: 247 mg/dL — ABNORMAL HIGH (ref 0–200)
HDL: 48.1 mg/dL (ref >39.00)
NonHDL: 199.19
Total CHOL/HDL Ratio: 5
Triglycerides: 236 mg/dL — ABNORMAL HIGH (ref 0.0–149.0)
VLDL: 47.2 mg/dL — ABNORMAL HIGH (ref 0.0–40.0)

## 2020-05-02 LAB — COMPREHENSIVE METABOLIC PANEL
ALT: 24 U/L (ref 0–35)
AST: 18 U/L (ref 0–37)
Albumin: 4.4 g/dL (ref 3.5–5.2)
Alkaline Phosphatase: 55 U/L (ref 39–117)
BUN: 15 mg/dL (ref 6–23)
CO2: 30 mEq/L (ref 19–32)
Calcium: 9.7 mg/dL (ref 8.4–10.5)
Chloride: 102 mEq/L (ref 96–112)
Creatinine, Ser: 0.81 mg/dL (ref 0.40–1.20)
GFR: 68.68 mL/min (ref >60.00)
Glucose, Bld: 105 mg/dL — ABNORMAL HIGH (ref 70–99)
Potassium: 4.4 mEq/L (ref 3.5–5.1)
Sodium: 140 mEq/L (ref 135–145)
Total Bilirubin: 0.4 mg/dL (ref 0.2–1.2)
Total Protein: 6.8 g/dL (ref 6.0–8.3)

## 2020-05-02 LAB — MICROALBUMIN / CREATININE URINE RATIO
Creatinine,U: 19.3 mg/dL
Microalb Creat Ratio: 3.6 mg/g (ref 0.0–30.0)
Microalb, Ur: <0.7 mg/dL (ref 0.0–1.9)

## 2020-05-02 MED ORDER — GLIPIZIDE ER 2.5 MG PO TB24: 2.5000 mg | ORAL_TABLET | Freq: Every day | ORAL | 3 refills | Status: DC

## 2020-05-02 NOTE — Patient Instructions (Addendum)
Please stop Metformin completely.  Start: - Glipizide XL 2.5 mg daily before b'fast.  Please stop at the lab.  Please return in 4 months with your sugar log.

## 2020-05-02 NOTE — Progress Notes (Signed)
Patient ID: Nicole Mccann, female   DOB: 04-Oct-1946, 74 y.o.   MRN: [id number]   This visit occurred during the SARS-CoV-2 public health emergency.  Safety protocols were in place, including screening questions prior to the visit, additional usage of staff PPE, and extensive cleaning of exam room while observing appropriate contact time as indicated for disinfecting solutions.   HPI: Nicole Mccann is a 74 y.o.-year-old female, presenting for follow-up for DM2, dx in initially in 1965 with GDM, then again GDM in 1974, non-insulin-dependent, controlled, with long-term complications (PN).  Last visit 9 months ago (virtual).  She is here with her daughter who offers part of the hx. especially related to her blood sugars, activity, and symptoms.  Reviewed HbA1c levels: Lab Results  Component Value Date   HGBA1C 6.3 (A) 01/09/2019   HGBA1C 5.8 (A) 09/05/2018   HGBA1C 6.3 (A) 04/18/2018   Her diabetes is controlled by diet.  He did take Metformin few times in the past and developed gas and abdominal pain.  She now has Metformin ER at hand but only taking it if the sugars are high. She tried to take it >> diarrhea.  Pt checks her sugars once a day: - am: 117, 126-144, 158 >> 114-142 >> 120-145, 150 - 2h after b'fast: n/c - before lunch: 109-131 >> 104, 123-133 >> 104 >> 113-170 - 2h after lunch: n/c >> 133 >> n/c - before dinner: n/c >> 141 >> 108-142 >> 138 - 2h after dinner: n/c >> 127-139 >> n/c - bedtime: n/c - nighttime: n/c Lowest sugar was 90 >> 108 >> 113; she has hypoglycemia awareness in the 70s. Highest: 158 >> 142 >> 170.  Glucometer: One Touch Verio  Pt's meals are: - Breakfast: cereals, yoghurt (Activia), pastries, tea, honey - Lunch: salad, leftover sandwich, fruit - Dinner: meat, veggies, potato - Snacks: Sweets, she has wine every day, occasionally sweet liquor  -No CKD, last BUN/creatinine:  Lab Results  Component Value Date   BUN 16 12/12/2017   BUN 18  11/09/2017   CREATININE 0.88 12/12/2017   CREATININE 0.79 11/09/2017   -+ HL; last set of lipids: Lab Results  Component Value Date   CHOL 229 (H) 09/05/2018   HDL 47.60 09/05/2018   LDLCALC 154 02/09/2017   LDLDIRECT 167.0 09/05/2018   TRIG 245.0 (H) 09/05/2018   CHOLHDL 5 09/05/2018  She refused statins. - last eye exam was in 12/2018: No DR, + cataracts - no numbness and tingling in her feet.  Pt has FH of DM in aunts.  She also has OSA-on a CPAP  In summer 2019, she had hAs >> temporal artery Bx negative. She developed anaphylaxis to Vancomycin while in the hospital.  ROS: Constitutional: + Weight gain/no weight loss, no fatigue, no subjective hyperthermia, no subjective hypothermia Eyes: no blurry vision, no xerophthalmia ENT: no sore throat, no nodules palpated in neck, no dysphagia, no odynophagia, no hoarseness Cardiovascular: no CP/no SOB/no palpitations/+ leg swelling Respiratory: no cough/no SOB/no wheezing Gastrointestinal: no N/no V/no D/no C/no acid reflux Musculoskeletal: no muscle aches/no joint aches Skin: + Rash on bilateral lower legs, no hair loss Neurological: no tremors/no numbness/no tingling/no dizziness  I reviewed pt's medications, allergies, PMH, social hx, family hx, and changes were documented in the history of present illness. Otherwise, unchanged from my initial visit note.  Past Medical History:  Diagnosis Date  . Allergy   . Anxiety    Dr Madaline Guthrie  . Asthma   . Atypical seizure (  HCC) 06/07/2012  . Depression    Dr Madaline Guthrie  . Diabetes mellitus   . DVT (deep venous thrombosis) (HCC)   . Headache(784.0)   . Hyperlipidemia   . Hypersomnia with sleep apnea, unspecified 05/22/2014  . Low blood pressure   . Olfactory aura   . OSA on CPAP   . Shingles 07/2013   Past Surgical History:  Procedure Laterality Date  . APPENDECTOMY    . ARTERY BIOPSY Left 04/14/2018   Procedure: BIOPSY TEMPORAL ARTERY;  Surgeon: Sherren Kerns, MD;  Location:  [facility] OR;  Service: Vascular;  Laterality: Left;  . BREAST EXCISIONAL BIOPSY Right 1998  . CHOLECYSTECTOMY    . COLONOSCOPY  2006   [facility]  . rotator cuff surgery    . TONSILLECTOMY      Social History   Socioeconomic History  . Marital status: Married    Spouse name: Lars Mage  . Number of children: 2  . Years of education: College  . Highest education level: Not on file  Occupational History  . Not on file  Tobacco Use  . Smoking status: Never Smoker  . Smokeless tobacco: Never Used  Substance and Sexual Activity  . Alcohol use: Yes    Alcohol/week: 3.0 standard drinks    Types: 3 Glasses of wine per week    Comment: 1/2 glass wine daily  . Drug use: No  . Sexual activity: Never  Other Topics Concern  . Not on file  Social History Narrative   Patient is married Lars Mage) and lives at home with her husband.   Patient has two adult children.   Patient is retired.   Patient has a college education.   Patient is right-handed.   Patient drinks one cup of tea daily.   Social Determinants of Health   Financial Resource Strain:   . Difficulty of Paying Living Expenses:   Food Insecurity:   . Worried About Programme researcher, broadcasting/film/video in the Last Year:   . Barista in the Last Year:   Transportation Needs:   . Freight forwarder (Medical):   Marland Kitchen Lack of Transportation (Non-Medical):   Physical Activity:   . Days of Exercise per Week:   . Minutes of Exercise per Session:   Stress:   . Feeling of Stress :   Social Connections:   . Frequency of Communication with Friends and Family:   . Frequency of Social Gatherings with Friends and Family:   . Attends Religious Services:   . Active Member of Clubs or Organizations:   . Attends Banker Meetings:   Marland Kitchen Marital Status:   Intimate Partner Violence:   . Fear of Current or Ex-Partner:   . Emotionally Abused:   Marland Kitchen Physically Abused:   . Sexually Abused:    Current Outpatient Medications on File Prior to Visit   Medication Sig Dispense Refill  . glucose blood test strip Use as instructed 2x a day - One Touch Verio 200 each 3  . OVER THE COUNTER MEDICATION Lidocaine 4% pain relieving gel-patch. Apply one patch to affected area no more than 3 to 4 times daily     No current facility-administered medications on file prior to visit.   Allergies  Allergen Reactions  . Mushroom Ext Cmplx(Shiitake-Reishi-Mait) Anaphylaxis  . Shellfish Allergy Anaphylaxis  . Sulfonamide Derivatives Anaphylaxis  . Vancomycin Anaphylaxis  . Fluticasone-Salmeterol Other (See Comments)    Feels like something in the throat  . Butalbital-Aspirin-Caffeine Other (See Comments)  UNSPECIFIED REACTION   . Montelukast Sodium Other (See Comments)    Feels like she's running out of breath  . Penicillins     UNSPECIFIED REACTION      Childhood allergy Has patient had a PCN reaction causing immediate rash, facial/tongue/throat swelling, SOB or lightheadedness with hypotension: Unknown Has patient had a PCN reaction causing severe rash involving mucus membranes or skin necrosis: Unknown Has patient had a PCN reaction that required hospitalization: No Has patient had a PCN reaction occurring within the last 10 years: No If all of the above answers are "NO", then may proceed with Cephalosporin use.    . Acetaminophen Other (See Comments)    Stomach discomfort  . Clarithromycin Nausea And Vomiting  . Iodinated Diagnostic Agents Other (See Comments)    Bad headache; no prep needed  . Levofloxacin Diarrhea and Nausea And Vomiting    Dizziness Believes it was due to a high dosage of this medication  . Orphenadrine Nausea And Vomiting   Family History  Problem Relation Age of Onset  . Stomach cancer Father   . Coronary artery disease Father   . Breast cancer Sister 93       breast cancer  . Diabetes Maternal Aunt   . Diabetes Paternal Aunt   . Prostate cancer Brother   . Cancer Daughter   . Stroke Neg Hx    PE: BP  120/78   Pulse 82   Ht 5' (1.524 m)   Wt 285 lb (129.3 kg)   SpO2 96%   BMI 55.66 kg/m  Wt Readings from Last 3 Encounters:  05/02/20 285 lb (129.3 kg)  01/09/19 271 lb (122.9 kg)  12/07/18 270 lb (122.5 kg)   Constitutional: overweight, in NAD, in wheelchair Eyes: PERRLA, EOMI, no exophthalmos ENT: moist mucous membranes, no thyromegaly, no cervical lymphadenopathy Cardiovascular: RRR, No MRG Respiratory: CTA B Gastrointestinal: abdomen soft, NT, ND, BS+ Musculoskeletal: no deformities, strength intact in all 4 Skin: moist, warm, + stasis dermatitis rash bilateral lower extremities Neurological: no tremor with outstretched hands, DTR normal in all 4  ASSESSMENT: 1. 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  Obesity class III  4. HL  PLAN:  1. Patient with longstanding, previously uncontrolled type 2 diabetes, now diet controlled after stopping Metformin ER due to GI side effects.  She does have this at hand in case her sugars start to increase.  Latest HbA1c was reviewed and this was 6.3% in 01/2019.  Our last visit was virtual so we could not check an HbA1c.  She is also due for annual labs. -In the past, we discussed about the need to improve her diet and I did refer her to nutrition. -At this visit, sugars are higher, as she is more stressed with her husband being just diagnosed with dementia. She also gained 14 pounds since last visit. She tried to take Metformin to bring her sugars down, but she had fecal incontinence and significant diarrhea with this. At this point, I advised her to throw away her Metformin and I suggested a DPP 4 inhibitor. However, her daughter checked the price for these medicines with her insurance and they are limits. Therefore, I advised her to start the glipizide XL 2.5 mg daily before breakfast. I advised her not to skip breakfast when she takes this. - I suggested to:  Patient Instructions   Please stop Metformin completely.  Start: - Glipizide XL 2.5 mg  daily before b'fast.  Please stop at the lab.  Please return in 4 months with your sugar log.  - we checked her HbA1c: 6.7% (slightly higher) - advised to check sugars at different times of the day - 1x a day, rotating check times - advised for yearly eye exams >> she is not UTD -We will check annual labs today - return to clinic in 4 months  2. PN -Due to diabetes -Sees neurology for this -Stable  3.  Obesity class III -I did refer her to nutrition in the past but she did not see the nutritionist -Unfortunately, she gained 14 pounds in the last visit as she is quite immobile  4. HL -Reviewed latest lipid panel from 08/2018: LDL very high, triglycerides high, HDL normal Lab Results  Component Value Date   CHOL 229 (H) 09/05/2018   HDL 47.60 09/05/2018   LDLCALC 154 02/09/2017   LDLDIRECT 167.0 09/05/2018   TRIG 245.0 (H) 09/05/2018   CHOLHDL 5 09/05/2018  -She refused statins in the past -She is due for another lipid panel >> we will check today  Component     Latest Ref Rng & Units 05/02/2020  Sodium     135 - 145 mEq/L 140  Potassium     3.5 - 5.1 mEq/L 4.4  Chloride     96 - 112 mEq/L 102  CO2     19 - 32 mEq/L 30  Glucose     70 - 99 mg/dL 105 (H)  BUN     6 - 23 mg/dL 15  Creatinine     0.40 - 1.20 mg/dL 0.81  Total Bilirubin     0.2 - 1.2 mg/dL 0.4  Alkaline Phosphatase     39 - 117 U/L 55  AST     0 - 37 U/L 18  ALT     0 - 35 U/L 24  Total Protein     6.0 - 8.3 g/dL 6.8  Albumin     3.5 - 5.2 g/dL 4.4  GFR     >60.00 mL/min 68.68  Calcium     8.4 - 10.5 mg/dL 9.7  Cholesterol     0 - 200 mg/dL 247 (H)  Triglycerides     0 - 149 mg/dL 236.0 (H)  HDL Cholesterol     >39.00 mg/dL 48.10  VLDL     0.0 - 40.0 mg/dL 47.2 (H)  Total CHOL/HDL Ratio      5  NonHDL      199.19  Microalb, Ur     0.0 - 1.9 mg/dL <0.7  Creatinine,U     mg/dL 19.3  MICROALB/CREAT RATIO      0.0 - 30.0 mg/g 3.6  Direct LDL     mg/dL 173.0   LDL very high, as are her triglycerides.  The rest of the labs are normal.  I would definitely encourage to start a statin.  I would recommend rosuvastatin 10.  Will check with the patient if she agrees to start this.  Philemon Kingdom, MD PhD [facility] Endocrinology

## 2020-05-05 ENCOUNTER — Encounter: Payer: Self-pay | Admitting: Internal Medicine

## 2020-05-06 ENCOUNTER — Other Ambulatory Visit: Payer: Self-pay | Admitting: Internal Medicine

## 2020-05-06 MED ORDER — ROSUVASTATIN CALCIUM 10 MG PO TABS: 10.0000 mg | ORAL_TABLET | Freq: Every day | ORAL | 3 refills | Status: DC

## 2020-05-14 NOTE — Progress Notes (Deleted)
[facility] at [facility] [address] [place], [place] [address] [phone]  Date:  05/19/2020   Name:  Nicole Mccann   DOB:  [birth year]   MRN:  [id number]  PCP:  Darreld Mclean, MD    Chief Complaint: No chief complaint on file.   History of Present Illness:  Nicole Mccann is a 74 y.o. very pleasant female patient who presents with the following: Patient here today for a physical exam History of diabetes with neuropathy, seizure disorder, history of DVT and PE, migraine headache, hyperlipidemia, sleep apnea, obesity, lower extremity swelling  Last seen by myself in February 2019 Her diabetes is managed by Dr. Noralyn Pick recent visit in June At that time they had her stop Metformin due to side effects, start her on glipizide A1c showed good control Dr. Cruzita Lederer also had her start on Crestor recently for dyslipidemia Lab Results  Component Value Date   HGBA1C 6.6 (A) 05/02/2020   She does see neurology-most recent visit I can see is from January 2020.  Apparently in 2019 there was concern about temporal arteritis, but her biopsy was negative At last neurology visit she was compliant with her CPAP machine   COVID-19 vaccines Can give a dose of Pneumovax Eye exam Foot exam Mammogram December 2020  Recent A1c, lipids, c-Met on chart, but no CBC Colon cancer screening ?? Patient Active Problem List   Diagnosis Date Noted  . Mixed hyperlipidemia 07/10/2019  . Dizziness and giddiness 12/22/2016  . Osteopenia 09/21/2016  . Diabetic polyneuropathy associated with diabetes mellitus due to underlying condition ([place]) 06/03/2016  . Low back pain 05/26/2016  . Encounter for medication management 04/15/2015  . Right shoulder pain 04/04/2015  . Adjustment disorder with mixed anxiety and depressed mood 03/14/2015  . Rotator cuff (capsule) sprain 03/13/2015  . Pseudoptosis 11/18/2014  . Seizure disorder, temporal  lobe, intractable ([place]) 10/17/2014  . 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 Neuropathy due to secondary diabetes mellitus ([place]) 10/17/2014  . Severe obesity (BMI >= 40) ([place]) 10/17/2014  . OSA on CPAP 10/17/2014  . Hypersomnia with sleep apnea, unspecified 05/22/2014  . Injury of left knee 08/22/2013  . Variants of migraine, not elsewhere classified, without mention of intractable migraine without mention of status migrainosus 05/22/2013  . 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 Spinal stenosis of lumbar region 12/17/2012  . Pulmonary embolism ([place]) 07/09/2012  . Dyspnea and respiratory abnormality 07/07/2012  . DVT (deep venous thrombosis) ([place]) 07/07/2012  . Atypical seizure ([place]) 06/07/2012  . Phlebitis following infusion 06/05/2012  . BENIGN POSITIONAL VERTIGO 11/06/2009  . Type 2 diabetes, controlled, with peripheral neuropathy ([place]) 04/04/2009  . ALLERGIC RHINITIS 04/04/2009  . ASTHMA 04/04/2009  . DIVERTICULOSIS, COLON 04/04/2009  . HYPERLIPIDEMIA NEC/NOS 06/29/2007  . DISORDER, DYSMETABOLIC SYNDROME X 97/28/2060    Past Medical History:  Diagnosis Date  . Allergy   . Anxiety    Dr Albertine Patricia  . Asthma   . Atypical seizure ([place]) 06/07/2012  . Depression    Dr Albertine Patricia  . Diabetes mellitus   . DVT (deep venous thrombosis) ([place])   . Headache(784.0)   . Hyperlipidemia   . Hypersomnia with sleep apnea, unspecified 05/22/2014  . Low blood pressure   . Olfactory aura   . OSA on CPAP   . Shingles 07/2013    Past Surgical History:  Procedure Laterality Date  . APPENDECTOMY    . ARTERY  [facility] at [facility] [address] [place], [place] [address] [phone]  Date:  05/19/2020   Name:  Nicole Mccann   DOB:  [birth year]   MRN:  [id number]  PCP:  Darreld Mclean, MD    Chief Complaint: No chief complaint on file.   History of Present Illness:  Nicole Mccann is a 74 y.o. very pleasant female patient who presents with the following: Patient here today for a physical exam History of diabetes with neuropathy, seizure disorder, history of DVT and PE, migraine headache, hyperlipidemia, sleep apnea, obesity, lower extremity swelling  Last seen by myself in February 2019 Her diabetes is managed by Dr. Noralyn Pick recent visit in June At that time they had her stop Metformin due to side effects, start her on glipizide A1c showed good control Dr. Cruzita Lederer also had her start on Crestor recently for dyslipidemia Lab Results  Component Value Date   HGBA1C 6.6 (A) 05/02/2020   She does see neurology-most recent visit I can see is from January 2020.  Apparently in 2019 there was concern about temporal arteritis, but her biopsy was negative At last neurology visit she was compliant with her CPAP machine   COVID-19 vaccines Can give a dose of Pneumovax Eye exam Foot exam Mammogram December 2020  Recent A1c, lipids, c-Met on chart, but no CBC Colon cancer screening ?? Patient Active Problem List   Diagnosis Date Noted  . Mixed hyperlipidemia 07/10/2019  . Dizziness and giddiness 12/22/2016  . Osteopenia 09/21/2016  . Diabetic polyneuropathy associated with diabetes mellitus due to underlying condition ([place]) 06/03/2016  . Low back pain 05/26/2016  . Encounter for medication management 04/15/2015  . Right shoulder pain 04/04/2015  . Adjustment disorder with mixed anxiety and depressed mood 03/14/2015  . Rotator cuff (capsule) sprain 03/13/2015  . Pseudoptosis 11/18/2014  . Seizure disorder, temporal  lobe, intractable ([place]) 10/17/2014  . 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 Neuropathy due to secondary diabetes mellitus ([place]) 10/17/2014  . Severe obesity (BMI >= 40) ([place]) 10/17/2014  . OSA on CPAP 10/17/2014  . Hypersomnia with sleep apnea, unspecified 05/22/2014  . Injury of left knee 08/22/2013  . Variants of migraine, not elsewhere classified, without mention of intractable migraine without mention of status migrainosus 05/22/2013  . 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 Spinal stenosis of lumbar region 12/17/2012  . Pulmonary embolism ([place]) 07/09/2012  . Dyspnea and respiratory abnormality 07/07/2012  . DVT (deep venous thrombosis) ([place]) 07/07/2012  . Atypical seizure ([place]) 06/07/2012  . Phlebitis following infusion 06/05/2012  . BENIGN POSITIONAL VERTIGO 11/06/2009  . Type 2 diabetes, controlled, with peripheral neuropathy ([place]) 04/04/2009  . ALLERGIC RHINITIS 04/04/2009  . ASTHMA 04/04/2009  . DIVERTICULOSIS, COLON 04/04/2009  . HYPERLIPIDEMIA NEC/NOS 06/29/2007  . DISORDER, DYSMETABOLIC SYNDROME X 97/28/2060    Past Medical History:  Diagnosis Date  . Allergy   . Anxiety    Dr Albertine Patricia  . Asthma   . Atypical seizure ([place]) 06/07/2012  . Depression    Dr Albertine Patricia  . Diabetes mellitus   . DVT (deep venous thrombosis) ([place])   . Headache(784.0)   . Hyperlipidemia   . Hypersomnia with sleep apnea, unspecified 05/22/2014  . Low blood pressure   . Olfactory aura   . OSA on CPAP   . Shingles 07/2013    Past Surgical History:  Procedure Laterality Date  . APPENDECTOMY    . ARTERY  [facility] at [facility] [address] [place], [place] [address] [phone]  Date:  05/19/2020   Name:  Nicole Mccann   DOB:  [birth year]   MRN:  [id number]  PCP:  Darreld Mclean, MD    Chief Complaint: No chief complaint on file.   History of Present Illness:  Nicole Mccann is a 74 y.o. very pleasant female patient who presents with the following: Patient here today for a physical exam History of diabetes with neuropathy, seizure disorder, history of DVT and PE, migraine headache, hyperlipidemia, sleep apnea, obesity, lower extremity swelling  Last seen by myself in February 2019 Her diabetes is managed by Dr. Noralyn Pick recent visit in June At that time they had her stop Metformin due to side effects, start her on glipizide A1c showed good control Dr. Cruzita Lederer also had her start on Crestor recently for dyslipidemia Lab Results  Component Value Date   HGBA1C 6.6 (A) 05/02/2020   She does see neurology-most recent visit I can see is from January 2020.  Apparently in 2019 there was concern about temporal arteritis, but her biopsy was negative At last neurology visit she was compliant with her CPAP machine   COVID-19 vaccines Can give a dose of Pneumovax Eye exam Foot exam Mammogram December 2020  Recent A1c, lipids, c-Met on chart, but no CBC Colon cancer screening ?? Patient Active Problem List   Diagnosis Date Noted  . Mixed hyperlipidemia 07/10/2019  . Dizziness and giddiness 12/22/2016  . Osteopenia 09/21/2016  . Diabetic polyneuropathy associated with diabetes mellitus due to underlying condition ([place]) 06/03/2016  . Low back pain 05/26/2016  . Encounter for medication management 04/15/2015  . Right shoulder pain 04/04/2015  . Adjustment disorder with mixed anxiety and depressed mood 03/14/2015  . Rotator cuff (capsule) sprain 03/13/2015  . Pseudoptosis 11/18/2014  . Seizure disorder, temporal  lobe, intractable ([place]) 10/17/2014  . 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 Neuropathy due to secondary diabetes mellitus ([place]) 10/17/2014  . Severe obesity (BMI >= 40) ([place]) 10/17/2014  . OSA on CPAP 10/17/2014  . Hypersomnia with sleep apnea, unspecified 05/22/2014  . Injury of left knee 08/22/2013  . Variants of migraine, not elsewhere classified, without mention of intractable migraine without mention of status migrainosus 05/22/2013  . 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 Spinal stenosis of lumbar region 12/17/2012  . Pulmonary embolism ([place]) 07/09/2012  . Dyspnea and respiratory abnormality 07/07/2012  . DVT (deep venous thrombosis) ([place]) 07/07/2012  . Atypical seizure ([place]) 06/07/2012  . Phlebitis following infusion 06/05/2012  . BENIGN POSITIONAL VERTIGO 11/06/2009  . Type 2 diabetes, controlled, with peripheral neuropathy ([place]) 04/04/2009  . ALLERGIC RHINITIS 04/04/2009  . ASTHMA 04/04/2009  . DIVERTICULOSIS, COLON 04/04/2009  . HYPERLIPIDEMIA NEC/NOS 06/29/2007  . DISORDER, DYSMETABOLIC SYNDROME X 97/28/2060    Past Medical History:  Diagnosis Date  . Allergy   . Anxiety    Dr Albertine Patricia  . Asthma   . Atypical seizure ([place]) 06/07/2012  . Depression    Dr Albertine Patricia  . Diabetes mellitus   . DVT (deep venous thrombosis) ([place])   . Headache(784.0)   . Hyperlipidemia   . Hypersomnia with sleep apnea, unspecified 05/22/2014  . Low blood pressure   . Olfactory aura   . OSA on CPAP   . Shingles 07/2013    Past Surgical History:  Procedure Laterality Date  . APPENDECTOMY    . ARTERY

## 2020-05-19 ENCOUNTER — Encounter: Payer: Medicare Other | Admitting: Family Medicine

## 2020-05-21 NOTE — Progress Notes (Signed)
Subjective:   Nicole Mccann is a 74 y.o. female who presents for Medicare Annual (Subsequent) preventive examination.  Review of Systems    Cardiac Risk Factors include: advanced age (>42men, >72 women);diabetes mellitus;dyslipidemia;obesity (BMI >30kg/m2);sedentary lifestyle     Objective:    Today's Vitals   05/22/20 1331  BP: (!) 141/57  Pulse: 72  Temp: (!) 97 F (36.1 C)  TempSrc: Temporal  SpO2: 96%  Weight: 287 lb 9.6 oz (130.5 kg)  Height: 5' (1.524 m)   Body mass index is 56.17 kg/m.  Advanced Directives 05/22/2020 04/13/2018 03/07/2018 11/09/2017 01/03/2017 12/17/2016 06/08/2016  Does Patient Have a Medical Advance Directive? Yes Yes Yes Yes Yes No Yes  Type of Estate agent of [place];Living will Healthcare Power of [place];Living will Healthcare Power of [place];Living will Living will Living will;Healthcare Power of [address] - Healthcare Power of [place];Living will  Does patient want to make changes to medical advance directive? No - Patient declined - No - Patient declined - No - Patient declined - -  Copy of Healthcare Power of Attorney in Chart? No - copy requested - No - copy requested - No - copy requested - -  Would patient like information on creating a medical advance directive? - - - - - - -  Pre-existing out of facility DNR order (yellow form or pink MOST form) - - - - - - -    Current Medications (verified) Outpatient Encounter Medications as of 05/22/2020  Medication Sig  . albuterol (VENTOLIN) (5 MG/ML) 0.5% NEBU   . Bacillus Coagulans-Inulin (PROBIOTIC) 1-250 BILLION-MG CAPS   . Cholecalciferol (VITAMIN D) 50 MCG (2000 UT) CAPS   . glipiZIDE (GLUCOTROL XL) 2.5 MG 24 hr tablet Take 1 tablet (2.5 mg total) by mouth daily with breakfast.  . glucose blood test strip Use as instructed 2x a day - One Touch Verio  . OVER THE COUNTER MEDICATION Lidocaine 4% pain relieving gel-patch. Apply one patch to affected area no more than 3 to 4  times daily  . rosuvastatin (CRESTOR) 10 MG tablet Take 1 tablet (10 mg total) by mouth daily.   No facility-administered encounter medications on file as of 05/22/2020.    Allergies (verified) Mushroom ext cmplx(shiitake-reishi-mait), Shellfish allergy, Sulfonamide derivatives, Vancomycin, Fluticasone-salmeterol, Butalbital-aspirin-caffeine, Montelukast sodium, Penicillins, Acetaminophen, Clarithromycin, Iodinated diagnostic agents, Levofloxacin, and Orphenadrine   History: Past Medical History:  Diagnosis Date  . Allergy   . Anxiety    Dr Madaline Guthrie  . Asthma   . Atypical seizure ([facility]) 06/07/2012  . Depression    Dr Madaline Guthrie  . Diabetes mellitus   . DVT (deep venous thrombosis) ([facility])   . Headache(784.0)   . Hyperlipidemia   . Hypersomnia with sleep apnea, unspecified 05/22/2014  . Low blood pressure   . Olfactory aura   . OSA on CPAP   . Shingles 07/2013   Past Surgical History:  Procedure Laterality Date  . APPENDECTOMY    . ARTERY BIOPSY Left 04/14/2018   Procedure: BIOPSY TEMPORAL ARTERY;  Surgeon: Sherren Kerns, MD;  Location: [facility] OR;  Service: Vascular;  Laterality: Left;  . BREAST EXCISIONAL BIOPSY Right 1998  . CHOLECYSTECTOMY    . COLONOSCOPY  2006   [facility]  . rotator cuff surgery    . TONSILLECTOMY      Family History  Problem Relation Age of Onset  . Stomach cancer Father   . Coronary artery disease Father   . Breast cancer Sister 52  breast cancer  . Diabetes Maternal Aunt   . Diabetes Paternal Aunt   . Prostate cancer Brother   . Cancer Daughter   . Stroke Neg Hx    Social History   Socioeconomic History  . Marital status: Married    Spouse name: Lars Mage  . Number of children: 2  . Years of education: College  . Highest education level: Not on file  Occupational History  . Not on file  Tobacco Use  . Smoking status: Never Smoker  . Smokeless tobacco: Never Used  Substance and Sexual Activity  . Alcohol use: Yes    Alcohol/week: 3.0  standard drinks    Types: 3 Glasses of wine per week    Comment: 1/2 glass wine daily  . Drug use: No  . Sexual activity: Never  Other Topics Concern  . Not on file  Social History Narrative   Patient is married Lars Mage) and lives at home with her husband.   Patient has two adult children.   Patient is retired.   Patient has a college education.   Patient is right-handed.   Patient drinks one cup of tea daily.   Social Determinants of Health   Financial Resource Strain: Low Risk   . Difficulty of Paying Living Expenses: Not hard at all  Food Insecurity: No Food Insecurity  . Worried About Programme researcher, broadcasting/film/video in the Last Year: Never true  . Ran Out of Food in the Last Year: Never true  Transportation Needs: No Transportation Needs  . Lack of Transportation (Medical): No  . Lack of Transportation (Non-Medical): No  Physical Activity:   . Days of Exercise per Week:   . Minutes of Exercise per Session:   Stress:   . Feeling of Stress :   Social Connections:   . Frequency of Communication with Friends and Family:   . Frequency of Social Gatherings with Friends and Family:   . Attends Religious Services:   . Active Member of Clubs or Organizations:   . Attends Banker Meetings:   Marland Kitchen Marital Status:     Tobacco Counseling Counseling given: Not Answered   Clinical Intake: Pain : No/denies pain    Activities of Daily Living In your present state of health, do you have any difficulty performing the following activities: 05/22/2020  Hearing? N  Vision? N  Difficulty concentrating or making decisions? N  Walking or climbing stairs? Y  Dressing or bathing? Y  Doing errands, shopping? Y  Preparing Food and eating ? N  Using the Toilet? N  In the past six months, have you accidently leaked urine? Y  Do you have problems with loss of bowel control? Y  Managing your Medications? N  Managing your Finances? Y  Housekeeping or managing your Housekeeping? Y  Some  recent data might be hidden    Patient Care Team: Copland, Gwenlyn Found, MD as PCP - General (Family Medicine) Dohmeier, Porfirio Mylar, MD as Consulting Physician (Neurology) Carlus Pavlov, MD as Consulting Physician (Internal Medicine)  Indicate any recent Medical Services you may have received from other than [facility] providers in the past year (date may be approximate).     Assessment:   This is a routine wellness examination for [place].   Dietary issues and exercise activities discussed: Current Exercise Habits: The patient does not participate in regular exercise at present, Exercise limited by: respiratory conditions(s);orthopedic condition(s)   Diet (meal preparation, eat out, water intake, caffeinated beverages, dairy products, fruits and vegetables): well  balanced, 24 hr recall Breakfast: cereal bar, yogurt, fruit Lunch: veggie sticks, bread and chicken sausage, salad Dinner:  hummus and boiled egg, pickles    Goals    .  Be Well    .  DIET - INCREASE WATER INTAKE    .  Increase Ambulation    .  Increase physical activity    .  Weight (lb) < 250 lb (113.4 kg) (pt-stated)      Depression Screen PHQ 2/9 Scores 05/22/2020 03/07/2018 01/03/2017 11/26/2016 05/05/2016 10/22/2015 10/17/2014  PHQ - 2 Score 0 0 0 0 0 1 2  PHQ- 9 Score - - - - - - 3  Exception Documentation - - - Other- indicate reason in comment box - - -    Fall Risk Fall Risk  05/22/2020 03/07/2018 01/03/2017 11/26/2016 05/05/2016  Falls in the past year? 1 No Yes No No  Number falls in past yr: 0 - 2 or more - -  Injury with Fall? 0 - No - -  Risk for fall due to : - - Impaired balance/gait Other (Comment) -  Follow up Education provided;Falls prevention discussed - Education provided;Falls prevention discussed - -   Lives w/ husband in split level home.  Any stairs in or around the home? Yes  If so, are there any without handrails? No  Home free of loose throw rugs in walkways, pet beds, electrical cords, etc? Yes    Adequate lighting in your home to reduce risk of falls? Yes   ASSISTIVE DEVICES UTILIZED TO PREVENT FALLS:  Life alert? No  Use of a cane, walker or w/c? Yes  Grab bars in the bathroom? Yes  Shower chair or bench in shower? No  Elevated toilet seat or a handicapped toilet? No   TIMED UP AND GO:  Was the test performed? No . Pt in wheelchair.  Cognitive Function: Ad8 score reviewed for issues:  Issues making decisions:no  Less interest in hobbies / activities:no  Repeats questions, stories (family complaining):no  Trouble using ordinary gadgets (microwave, computer, phone):no  Forgets the month or year: no  Mismanaging finances: no  Remembering appts:no  Daily problems with thinking and/or memory:no Ad8 score is=0     MMSE - Mini Mental State Exam 01/03/2017  Orientation to time 5  Orientation to Place 5  Registration 3  Attention/ Calculation 5  Recall 3  Language- name 2 objects 2  Language- repeat 1  Language- follow 3 step command 3  Language- read & follow direction 1  Write a sentence 1  Copy design 1  Total score 30   Montreal Cognitive Assessment  06/03/2016  Visuospatial/ Executive (0/5) 5  Naming (0/3) 3  Attention: Read list of digits (0/2) 2  Attention: Read list of letters (0/1) 1  Attention: Serial 7 subtraction starting at 100 (0/3) 3  Language: Repeat phrase (0/2) 1  Language : Fluency (0/1) 0  Abstraction (0/2) 2  Delayed Recall (0/5) 4  Orientation (0/6) 5  Total 26  Adjusted Score (based on education) 26      Immunizations Immunization History  Administered Date(s) Administered  . Influenza Split 07/25/2012  . Influenza Whole 09/27/2007, 08/21/2008, 09/17/2009  . Influenza, High Dose Seasonal PF 09/05/2018  . Influenza,inj,Quad PF,6+ Mos 07/23/2015, 08/08/2016  . PFIZER SARS-COV-2 Vaccination 01/01/2020, 01/22/2020  . Pneumococcal Conjugate-13 03/13/2015  . Td 11/08/2001  . Tdap 08/17/2013    TDAP status: Up to  date Pneumococcal vaccine status: Up to date PER PT. Will verify with  pharmacy. Covid-19 vaccine status: Completed vaccines    Screening Tests Health Maintenance  Topic Date Due  . PNA vac Low Risk Adult (2 of 2 - PPSV23) 03/12/2016  . OPHTHALMOLOGY EXAM  01/03/2017  . FOOT EXAM  05/05/2017  . INFLUENZA VACCINE  06/08/2020  . HEMOGLOBIN A1C  11/01/2020  . TETANUS/TDAP  08/18/2023  . DEXA SCAN  Completed  . COVID-19 Vaccine  Completed  . Hepatitis C Screening  Completed    Health Maintenance  Health Maintenance Due  Topic Date Due  . PNA vac Low Risk Adult (2 of 2 - PPSV23) 03/12/2016  . OPHTHALMOLOGY EXAM  01/03/2017  . FOOT EXAM  05/05/2017    Colon cancer screen- pt declines Mammogram status: Completed 10/12/19. Repeat every year   Lung Cancer Screening: (Low Dose CT Chest recommended if Age 63-80 years, 30 pack-year currently smoking OR have quit w/in 15years.) does not qualify.   Additional Screening:  Hepatitis C Screening: does qualify; Completed 05/05/16   Vision Screening: Recommended annual ophthalmology exams for early detection of glaucoma and other disorders of the eye. Pt states she will schedule.   Dental Screening: Recommended annual dental exams for proper oral hygiene  Community Resource Referral / Chronic Care Management: CRR required this visit?  No   CCM required this visit?  No      Plan:    Please schedule your next medicare wellness visit with me in 1 yr.  Continue to eat heart healthy diet (full of fruits, vegetables, whole grains, lean protein, water--limit salt, fat, and sugar intake) and increase physical activity as tolerated.  Continue doing brain stimulating activities (puzzles, reading, adult coloring books, staying active) to keep memory sharp.   Bring a copy of your living will and/or healthcare power of attorney to your next office visit.  I have personally reviewed and noted the following in the patient's chart:    . Medical and social history . Use of alcohol, tobacco or illicit drugs  . Current medications and supplements . Functional ability and status . Nutritional status . Physical activity . Advanced directives . List of other physicians . Hospitalizations, surgeries, and ER visits in previous 12 months . Vitals . Screenings to include cognitive, depression, and falls . Referrals and appointments  In addition, I have reviewed and discussed with patient certain preventive protocols, quality metrics, and best practice recommendations. A written personalized care plan for preventive services as well as general preventive health recommendations were provided to patient.     Avon Gully, [place]   05/22/2020

## 2020-05-22 ENCOUNTER — Encounter: Payer: Self-pay | Admitting: *Deleted

## 2020-05-22 ENCOUNTER — Other Ambulatory Visit: Payer: Self-pay

## 2020-05-22 ENCOUNTER — Ambulatory Visit (INDEPENDENT_AMBULATORY_CARE_PROVIDER_SITE_OTHER): Payer: Medicare Other | Admitting: Family Medicine

## 2020-05-22 ENCOUNTER — Encounter: Payer: Self-pay | Admitting: Family Medicine

## 2020-05-22 ENCOUNTER — Ambulatory Visit (INDEPENDENT_AMBULATORY_CARE_PROVIDER_SITE_OTHER): Payer: Medicare Other | Admitting: *Deleted

## 2020-05-22 VITALS — BP 141/57 | HR 72 | Temp 97.0°F | Ht 60.0 in | Wt 287.6 lb

## 2020-05-22 VITALS — BP 141/57 | HR 72 | Temp 97.0°F | Resp 18 | Ht 60.0 in | Wt 287.0 lb

## 2020-05-22 DIAGNOSIS — J4520 Mild intermittent asthma, uncomplicated: Secondary | ICD-10-CM | POA: Diagnosis not present

## 2020-05-22 DIAGNOSIS — I872 Venous insufficiency (chronic) (peripheral): Secondary | ICD-10-CM

## 2020-05-22 DIAGNOSIS — Z0000 Encounter for general adult medical examination without abnormal findings: Secondary | ICD-10-CM | POA: Diagnosis not present

## 2020-05-22 DIAGNOSIS — Z23 Encounter for immunization: Secondary | ICD-10-CM

## 2020-05-22 DIAGNOSIS — E1142 Type 2 diabetes mellitus with diabetic polyneuropathy: Secondary | ICD-10-CM

## 2020-05-22 MED ORDER — GABAPENTIN 100 MG PO CAPS: 100.0000 mg | ORAL_CAPSULE | Freq: Three times a day (TID) | ORAL | 3 refills | Status: DC

## 2020-05-22 MED ORDER — ALBUTEROL SULFATE HFA 108 (90 BASE) MCG/ACT IN AERS: 2.0000 | INHALATION_SPRAY | Freq: Four times a day (QID) | RESPIRATORY_TRACT | 6 refills | Status: DC | PRN

## 2020-05-22 NOTE — Patient Instructions (Addendum)
It was good to see you again today!   We refilled your albuterol for you to use as needed For your foot pain/ nerve pain start on gabapentin.  Take at bedtime for two days, then twice a day for 2 days then three times a day.  Let me know if this helps you- we can go up on the dose if we need to!  Please see me in about 6 months I will place a referral to a dietician for you as well     Health Maintenance After Age 76 After age 50, you are at a higher risk for certain long-term diseases and infections as well as injuries from falls. Falls are a major cause of broken bones and head injuries in people who are older than age 4. Getting regular preventive care can help to keep you healthy and well. Preventive care includes getting regular testing and making lifestyle changes as recommended by your health care provider. Talk with your health care provider about:  Which screenings and tests you should have. A screening is a test that checks for a disease when you have no symptoms.  A diet and exercise plan that is right for you. What should I know about screenings and tests to prevent falls? Screening and testing are the best ways to find a health problem early. Early diagnosis and treatment give you the best chance of managing medical conditions that are common after age 22. Certain conditions and lifestyle choices may make you more likely to have a fall. Your health care provider may recommend:  Regular vision checks. Poor vision and conditions such as cataracts can make you more likely to have a fall. If you wear glasses, make sure to get your prescription updated if your vision changes.  Medicine review. Work with your health care provider to regularly review all of the medicines you are taking, including over-the-counter medicines. Ask your health care provider about any side effects that may make you more likely to have a fall. Tell your health care provider if any medicines that you take make you  feel dizzy or sleepy.  Osteoporosis screening. Osteoporosis is a condition that causes the bones to get weaker. This can make the bones weak and cause them to break more easily.  Blood pressure screening. Blood pressure changes and medicines to control blood pressure can make you feel dizzy.  Strength and balance checks. Your health care provider may recommend certain tests to check your strength and balance while standing, walking, or changing positions.  Foot health exam. Foot pain and numbness, as well as not wearing proper footwear, can make you more likely to have a fall.  Depression screening. You may be more likely to have a fall if you have a fear of falling, feel emotionally low, or feel unable to do activities that you used to do.  Alcohol use screening. Using too much alcohol can affect your balance and may make you more likely to have a fall. What actions can I take to lower my risk of falls? General instructions  Talk with your health care provider about your risks for falling. Tell your health care provider if: ? You fall. Be sure to tell your health care provider about all falls, even ones that seem minor. ? You feel dizzy, sleepy, or off-balance.  Take over-the-counter and prescription medicines only as told by your health care provider. These include any supplements.  Eat a healthy diet and maintain a healthy weight. A healthy diet  includes low-fat dairy products, low-fat (lean) meats, and fiber from whole grains, beans, and lots of fruits and vegetables. Home safety  Remove any tripping hazards, such as rugs, cords, and clutter.  Install safety equipment such as grab bars in bathrooms and safety rails on stairs.  Keep rooms and walkways well-lit. Activity   Follow a regular exercise program to stay fit. This will help you maintain your balance. Ask your health care provider what types of exercise are appropriate for you.  If you need a cane or walker, use it as  recommended by your health care provider.  Wear supportive shoes that have nonskid soles. Lifestyle  Do not drink alcohol if your health care provider tells you not to drink.  If you drink alcohol, limit how much you have: ? 0-1 drink a day for women. ? 0-2 drinks a day for men.  Be aware of how much alcohol is in your drink. In the [place] one drink equals one typical bottle of beer (12 oz), one-half glass of wine (5 oz), or one shot of hard liquor (1 oz).  Do not use any products that contain nicotine or tobacco, such as cigarettes and e-cigarettes. If you need help quitting, ask your health care provider. Summary  Having a healthy lifestyle and getting preventive care can help to protect your health and wellness after age 59.  Screening and testing are the best way to find a health problem early and help you avoid having a fall. Early diagnosis and treatment give you the best chance for managing medical conditions that are more common for people who are older than age 53.  Falls are a major cause of broken bones and head injuries in people who are older than age 56. Take precautions to prevent a fall at home.  Work with your health care provider to learn what changes you can make to improve your health and wellness and to prevent falls. This information is not intended to replace advice given to you by your health care provider. Make sure you discuss any questions you have with your health care provider. Document Revised: 02/15/2019 Document Reviewed: 09/07/2017 Elsevier Patient Education  2020 [employer].

## 2020-05-22 NOTE — Patient Instructions (Signed)
Please schedule your next medicare wellness visit with me in 1 yr.  Continue to eat heart healthy diet (full of fruits, vegetables, whole grains, lean protein, water--limit salt, fat, and sugar intake) and increase physical activity as tolerated.  Continue doing brain stimulating activities (puzzles, reading, adult coloring books, staying active) to keep memory sharp.   Bring a copy of your living will and/or healthcare power of attorney to your next office visit.   Ms. Steichen , Thank you for taking time to come for your Medicare Wellness Visit. I appreciate your ongoing commitment to your health goals. Please review the following plan we discussed and let me know if I can assist you in the future.   These are the goals we discussed: Goals    .  Be Well    .  DIET - INCREASE WATER INTAKE    .  Increase Ambulation    .  Increase physical activity    .  Weight (lb) < 250 lb (113.4 kg) (pt-stated)       This is a list of the screening recommended for you and due dates:  Health Maintenance  Topic Date Due  . Pneumonia vaccines (2 of 2 - PPSV23) 03/12/2016  . Eye exam for diabetics  01/03/2017  . Complete foot exam   05/05/2017  . Flu Shot  06/08/2020  . Hemoglobin A1C  11/01/2020  . Tetanus Vaccine  08/18/2023  . DEXA scan (bone density measurement)  Completed  . COVID-19 Vaccine  Completed  .  Hepatitis C: One time screening is recommended by Center for Disease Control  (CDC) for  adults born from [birth year] through 1967.   Completed    Preventive Care 22 Years and Older, Female Preventive care refers to lifestyle choices and visits with your health care provider that can promote health and wellness. This includes:  A yearly physical exam. This is also called an annual well check.  Regular dental and eye exams.  Immunizations.  Screening for certain conditions.  Healthy lifestyle choices, such as diet and exercise. What can I expect for my preventive care visit? Physical  exam Your health care provider will check:  Height and weight. These may be used to calculate body mass index (BMI), which is a measurement that tells if you are at a healthy weight.  Heart rate and blood pressure.  Your skin for abnormal spots. Counseling Your health care provider may ask you questions about:  Alcohol, tobacco, and drug use.  Emotional well-being.  Home and relationship well-being.  Sexual activity.  Eating habits.  History of falls.  Memory and ability to understand (cognition).  Work and work Statistician.  Pregnancy and menstrual history. What immunizations do I need?  Influenza (flu) vaccine  This is recommended every year. Tetanus, diphtheria, and pertussis (Tdap) vaccine  You may need a Td booster every 10 years. Varicella (chickenpox) vaccine  You may need this vaccine if you have not already been vaccinated. Zoster (shingles) vaccine  You may need this after age 76. Pneumococcal conjugate (PCV13) vaccine  One dose is recommended after age 76. Pneumococcal polysaccharide (PPSV23) vaccine  One dose is recommended after age 76. Measles, mumps, and rubella (MMR) vaccine  You may need at least one dose of MMR if you were born in 1957 or later. You may also need a second dose. Meningococcal conjugate (MenACWY) vaccine  You may need this if you have certain conditions. Hepatitis A vaccine  You may need this if you have certain  conditions or if you travel or work in places where you may be exposed to hepatitis A. Hepatitis B vaccine  You may need this if you have certain conditions or if you travel or work in places where you may be exposed to hepatitis B. Haemophilus influenzae type b (Hib) vaccine  You may need this if you have certain conditions. You may receive vaccines as individual doses or as more than one vaccine together in one shot (combination vaccines). Talk with your health care provider about the risks and benefits of  combination vaccines. What tests do I need? Blood tests  Lipid and cholesterol levels. These may be checked every 5 years, or more frequently depending on your overall health.  Hepatitis C test.  Hepatitis B test. Screening  Lung cancer screening. You may have this screening every year starting at age 76 if you have a 30-pack-year history of smoking and currently smoke or have quit within the past 15 years.  Colorectal cancer screening. All adults should have this screening starting at age 76 and continuing until age 27. Your health care provider may recommend screening at age 25 if you are at increased risk. You will have tests every 1-10 years, depending on your results and the type of screening test.  Diabetes screening. This is done by checking your blood sugar (glucose) after you have not eaten for a while (fasting). You may have this done every 1-3 years.  Mammogram. This may be done every 1-2 years. Talk with your health care provider about how often you should have regular mammograms.  BRCA-related cancer screening. This may be done if you have a family history of breast, ovarian, tubal, or peritoneal cancers. Other tests  Sexually transmitted disease (STD) testing.  Bone density scan. This is done to screen for osteoporosis. You may have this done starting at age 76. Follow these instructions at home: Eating and drinking  Eat a diet that includes fresh fruits and vegetables, whole grains, lean protein, and low-fat dairy products. Limit your intake of foods with high amounts of sugar, saturated fats, and salt.  Take vitamin and mineral supplements as recommended by your health care provider.  Do not drink alcohol if your health care provider tells you not to drink.  If you drink alcohol: ? Limit how much you have to 0-1 drink a day. ? Be aware of how much alcohol is in your drink. In the [place] one drink equals one 12 oz bottle of beer (355 mL), one 5 oz glass of wine (148  mL), or one 1 oz glass of hard liquor (44 mL). Lifestyle  Take daily care of your teeth and gums.  Stay active. Exercise for at least 30 minutes on 5 or more days each week.  Do not use any products that contain nicotine or tobacco, such as cigarettes, e-cigarettes, and chewing tobacco. If you need help quitting, ask your health care provider.  If you are sexually active, practice safe sex. Use a condom or other form of protection in order to prevent STIs (sexually transmitted infections).  Talk with your health care provider about taking a low-dose aspirin or statin. What's next?  Go to your health care provider once a year for a well check visit.  Ask your health care provider how often you should have your eyes and teeth checked.  Stay up to date on all vaccines. This information is not intended to replace advice given to you by your health care provider. Make sure you  discuss any questions you have with your health care provider. Document Revised: 10/19/2018 Document Reviewed: 10/19/2018 Elsevier Patient Education  2020 [employer].

## 2020-05-22 NOTE — Progress Notes (Signed)
[facility] [address] [place], [place] [address] [phone]  Date:  05/22/2020   Name:  Nicole Mccann   DOB:  1946-07-09   MRN:  [id number]  PCP:  Nicole Cables, MD    Chief Complaint: Annual Exam (Dexa scan) and Anxiety (husband dx with dimention)   History of Present Illness:  Nicole Mccann is a 74 y.o. very pleasant female patient who presents with the following:  Here today for a physical exam-she is accompanied by her husband and daughter today.  Riding in a wheelchair Last seen by myself in February 2019-we missed last year due to pandemic History of diabetes with polyneuropathy, obesity, hyperlipidemia, seizure disorder, sleep apnea on CPAP, venous insufficiency with swelling of bilateral lower extremities  Today she notes that her bilateral leg pain is getting worse; she notes a burning type pain in her feet and lower limbs bilaterally She also has sciatica pain Her asthma is getting worse -she has been using albuterol approximately once a week, has often been borrowing albuterol from her daughter.  Her diabetes is managed by endocrinology, Nicole Mccann recent visit last month At that time they had stopped Metformin due to GI side effects and starting a low-dose of glipizide A1c showed okay control her diabetes Nicole Mccann feels like the glipizide is also causing GI side effects, she notes she will alternate between diarrhea and constipation, and may have abdominal cramping  COVID-19 vaccine- done Eye exam Foot exam is due Recent labs already on chart but no recent CBC Can give Pneumovax- she would like to do this  Shingrix- pt pt this is already done  She declines colon cancer screening- she had a colonoscopy once and does not want to do this again   Glipizide ER 2.5 Crestor started recently She is using CBD oil for her leg pain which helps somewhat  BP Readings from Last 3 Encounters:   05/22/20 (!) 141/57  05/22/20 (!) 141/57  05/02/20 120/78     Lab Results  Component Value Date   HGBA1C 6.6 (A) 05/02/2020    Patient Active Problem List   Diagnosis Date Noted  . Mixed hyperlipidemia 07/10/2019  . Dizziness and giddiness 12/22/2016  . Osteopenia 09/21/2016  . Low back pain 05/26/2016  . Right shoulder pain 04/04/2015  . Adjustment disorder with mixed anxiety and depressed mood 03/14/2015  . Rotator cuff (capsule) sprain 03/13/2015  . Pseudoptosis 11/18/2014  . Seizure disorder, temporal lobe, intractable (HCC) 10/17/2014  . 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 Severe obesity (BMI >= 40) (HCC) 10/17/2014  . OSA on CPAP 10/17/2014  . Hypersomnia with sleep apnea, unspecified 05/22/2014  . Injury of left knee 08/22/2013  . Variants of migraine, not elsewhere classified, without mention of intractable migraine without mention of status migrainosus 05/22/2013  . 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 Spinal stenosis of lumbar region 12/17/2012  . Pulmonary embolism (HCC) 07/09/2012  . DVT (deep venous thrombosis) (HCC) 07/07/2012  . Atypical seizure (HCC) 06/07/2012  . Phlebitis following infusion 06/05/2012  . BENIGN POSITIONAL VERTIGO 11/06/2009  . Type 2 diabetes, controlled, with peripheral neuropathy (HCC) 04/04/2009  . ALLERGIC RHINITIS 04/04/2009  . ASTHMA 04/04/2009  . Diverticulosis of colon 04/04/2009    Past Medical History:  Diagnosis Date  . Allergy   . Anxiety    Nicole Mccann  . Asthma   . Atypical seizure (HCC) 06/07/2012  .  Depression    Nicole Mccann  . Diabetes mellitus   . DVT (deep venous thrombosis) (HCC)   . Headache(784.0)   . Hyperlipidemia   . Hypersomnia with sleep apnea, unspecified 05/22/2014  . Low blood pressure   . Olfactory aura   . OSA on CPAP   . Shingles 07/2013    Past Surgical History:  Procedure Laterality Date  . APPENDECTOMY    . ARTERY BIOPSY Left 04/14/2018   Procedure: BIOPSY  TEMPORAL ARTERY;  Surgeon: Nicole Kerns, MD;  Location: [facility] OR;  Service: Vascular;  Laterality: Left;  . BREAST EXCISIONAL BIOPSY Right 1998  . CHOLECYSTECTOMY    . COLONOSCOPY  2006   [facility]  . rotator cuff surgery    . TONSILLECTOMY       Social History   Tobacco Use  . Smoking status: Never Smoker  . Smokeless tobacco: Never Used  Substance Use Topics  . Alcohol use: Yes    Alcohol/week: 3.0 standard drinks    Types: 3 Glasses of wine per week    Comment: 1/2 glass wine daily  . Drug use: No    Family History  Problem Relation Age of Onset  . Stomach cancer Father   . Coronary artery disease Father   . Breast cancer Sister 77       breast cancer  . Diabetes Maternal Aunt   . Diabetes Paternal Aunt   . Prostate cancer Brother   . Cancer Daughter   . Stroke Neg Hx     Allergies  Allergen Reactions  . Mushroom Ext Cmplx(Shiitake-Reishi-Mait) Anaphylaxis  . Shellfish Allergy Anaphylaxis  . Sulfonamide Derivatives Anaphylaxis  . Vancomycin Anaphylaxis  . Fluticasone-Salmeterol Other (See Comments)    Feels like something in the throat  . Butalbital-Aspirin-Caffeine Other (See Comments)    UNSPECIFIED REACTION   . Montelukast Sodium Other (See Comments)    Feels like she's running out of breath  . Penicillins     UNSPECIFIED REACTION      Childhood allergy Has patient had a PCN reaction causing immediate rash, facial/tongue/throat swelling, SOB or lightheadedness with hypotension: Unknown Has patient had a PCN reaction causing severe rash involving mucus membranes or skin necrosis: Unknown Has patient had a PCN reaction that required hospitalization: No Has patient had a PCN reaction occurring within the last 10 years: No If all of the above answers are "NO", then may proceed with Cephalosporin use.    . Acetaminophen Other (See Comments)    Stomach discomfort  . Clarithromycin Nausea And Vomiting  . Iodinated Diagnostic Agents Other (See Comments)     Bad headache; no prep needed  . Levofloxacin Diarrhea and Nausea And Vomiting    Dizziness Believes it was due to a high dosage of this medication  . Orphenadrine Nausea And Vomiting    Medication list has been reviewed and updated.  Current Outpatient Medications on File Prior to Visit  Medication Sig Dispense Refill  . glipiZIDE (GLUCOTROL XL) 2.5 MG 24 hr tablet Take 1 tablet (2.5 mg total) by mouth daily with breakfast. 90 tablet 3  . glucose blood test strip Use as instructed 2x a day - One Touch Verio 200 each 3  . OVER THE COUNTER MEDICATION Lidocaine 4% pain relieving gel-patch. Apply one patch to affected area no more than 3 to 4 times daily    . rosuvastatin (CRESTOR) 10 MG tablet Take 1 tablet (10 mg total) by mouth daily. 90 tablet 3   No current  facility-administered medications on file prior to visit.    Review of Systems:  As per HPI- otherwise negative. Admits she does not walk very much  Physical Examination: Vitals:   05/22/20 1357  BP: (!) 141/57  Pulse: 72  Resp: 18  Temp: (!) 97 F (36.1 C)  SpO2: 96%   Vitals:   05/22/20 1357  Weight: 287 lb (130.2 kg)  Height: 5' (1.524 m)   Body mass index is 56.05 kg/m. Ideal Body Weight: Weight in (lb) to have BMI = 25: 127.7  GEN: no acute distress.  Significant obesity, sitting in wheelchair today HEENT: Atraumatic, Normocephalic.  Ears and Nose: No external deformity. CV: RRR, No M/G/R. No JVD. No thrill. No extra heart sounds. PULM: CTA B, no wheezes, crackles, rhonchi. No retractions. No resp. distress. No accessory muscle use. ABD: S, NT, ND, +BS. No rebound. No HSM. EXTR: No c/c.  Chronic venous stasis hyperpigmentation is present on both shins PSYCH: Normally interactive. Conversant.  Foot exam; normal pulses and sensation to monofilament.  Mild soft puffy edema is present Left base of thumb displays a mobile nodule which has been present for several months.  It is not especially bothersome to  patient  She scoots around her home on a rolling chair but does not use a wheelchair at home.  Admits that she does minimal walking  Assessment and Plan: Physical exam  Type 2 diabetes, controlled, with peripheral neuropathy (HCC) - Plan: gabapentin (NEURONTIN) 100 MG capsule, Amb ref to Medical Nutrition Therapy-MNT  Mild intermittent asthma, unspecified whether complicated - Plan: albuterol (VENTOLIN HFA) 108 (90 Base) MCG/ACT inhaler  Immunization due - Plan: Pneumococcal polysaccharide vaccine 23-valent greater than or equal to 2yo subcutaneous/IM  Chronic venous insufficiency of lower extremity  Here today for routine physical exam Her diabetes is managed by endocrinology.  She is on a very low dose of glipizide right now, recent A1c showed good control.  She continues to complain of GI side effects even from this minimal dose of glipizide.  I will touch base with her endocrinologist, she may be able to stop treatment She would like a referral to nutrition to discuss diet for diabetes Refill albuterol to use as needed Pneumonia vaccine Discussed her chronic venous insufficiency-elevation of her legs and increased walking may help She also has peripheral neuropathy, will start her on gabapentin.  I advised this may cause sedation, we can increase dose if they would like Visit in 6 months  This visit occurred during the SARS-CoV-2 public health emergency.  Safety protocols were in place, including screening questions prior to the visit, additional usage of staff PPE, and extensive cleaning of exam room while observing appropriate contact time as indicated for disinfecting solutions.    Signed Abbe Amsterdam, MD

## 2020-05-23 ENCOUNTER — Telehealth: Payer: Self-pay | Admitting: Family Medicine

## 2020-05-23 NOTE — Telephone Encounter (Signed)
Please Advise if we can remove No Show fee for patient Dos : 05/19/2020. Patient states she came in but late and asked by front desk staff  to r/s.

## 2020-05-23 NOTE — Telephone Encounter (Signed)
Sure, no need for fee

## 2020-06-02 ENCOUNTER — Encounter: Payer: Self-pay | Admitting: Family Medicine

## 2020-06-02 NOTE — Telephone Encounter (Signed)
Spoke with pt's daughter to let her know I have handled the potential N/S fee, but should she have any issues, she can reach me here at the office.

## 2020-07-07 ENCOUNTER — Other Ambulatory Visit: Payer: Self-pay

## 2020-07-07 ENCOUNTER — Encounter: Payer: Medicare Other | Attending: Family Medicine | Admitting: Dietician

## 2020-07-07 ENCOUNTER — Encounter: Payer: Self-pay | Admitting: Dietician

## 2020-07-07 DIAGNOSIS — E1142 Type 2 diabetes mellitus with diabetic polyneuropathy: Secondary | ICD-10-CM | POA: Diagnosis present

## 2020-07-07 NOTE — Progress Notes (Signed)
Diabetes Self-Management Education  Visit Type: First/Initial  Appt. Start Time: 3:20pm   Appt. End Time: 4:20pm  07/07/2020  [name], identified by name and date of birth, is a 74 y.o. female with a diagnosis of Diabetes: Type 2.   ASSESSMENT  Patient and her daughter were present for today's visit. Patient states her primary concern is weight gain over the past year and a half despite not changing much about her diet. States she does not exercise. States there is a lot of stress among her family due to her husband's health. Nicole Mccann of her daughters now lives with her and her husband to help shop for and prepare food. Patient states she has cut back on potatoes/bread and daughter states she mostly focuses on grains/starches that are high in fiber. Will have pasta made out of garbanzo or black beans versus wheat flour.    Diabetes Self-Management Education - 07/07/20 1533      Visit Information   Visit Type First/Initial      Initial Visit   Diabetes Type Type 2    Are you currently following a meal plan? No    Are you taking your medications as prescribed? Not on Medications    Date Diagnosed 1976      Health Coping   How would you rate your overall health? Fair      Psychosocial Assessment   Patient Belief/Attitude about Diabetes Motivated to manage diabetes    Self-care barriers None    Self-management support Doctor's office;Family   daughter now lives with pt and husband to help care for them   Other persons present Family Member   daughter   Patient Concerns Weight Control    Special Needs None    Preferred Learning Style No preference indicated    Learning Readiness Ready    How often do you need to have someone help you when you read instructions, pamphlets, or other written materials from your doctor or pharmacy? 3 - Sometimes    What is the last grade level you completed in school? college      Complications   Last HgB A1C per patient/outside source 6.7 %      How often do you check your blood sugar? 1-2 times/day    Have you had a dilated eye exam in the past 12 months? No    Have you had a dental exam in the past 12 months? No    Are you checking your feet? Yes    How many days per week are you checking your feet? 7      Dietary Intake   Breakfast 1/2 orange + 1/2 apple + scoop plain [place] yogurt w/ Splenda + scoop granola cereal   or ham & cheese omelet   Lunch chicken + vegetables    Snack (afternoon) Fiber Nicole Mccann bar + Spanish cracker    Dinner smoked almonds, boiled egg, 1 Tbsp hummus, pickles    Snack (evening) Dove sorbet ice cream (2x/week)    Beverage(s) hot tea, sugar-free grape flavored water, coffee w/ 2% Lactaid milk & Splenda, water, 1/2 cup beer      Exercise   Exercise Type ADL's    How many days per week to you exercise? 0    How many minutes per day do you exercise? 0    Total minutes per week of exercise 0      Patient Education   Previous Diabetes Education No    Nutrition management  Role of  diet in the treatment of diabetes and the relationship between the three main macronutrients and blood glucose level;Food label reading, portion sizes and measuring food.;Information on hints to eating out and maintain blood glucose control.;Meal options for control of blood glucose level and chronic complications.    Physical activity and exercise  Role of exercise on diabetes management, blood pressure control and cardiac health.    Psychosocial adjustment Worked with patient to identify barriers to care and solutions;Role of stress on diabetes;Helped patient identify a support system for diabetes management;Identified and addressed patients feelings and concerns about diabetes      Individualized Goals (developed by patient)   Nutrition General guidelines for healthy choices and portions discussed      Outcomes   Expected Outcomes Demonstrated interest in learning. Expect positive outcomes    Future DMSE PRN            Individualized Plan for Diabetes Self-Management Training:  Learning Objective:  Patient will have a greater understanding of diabetes self-management. Patient education plan is to attend individual and/or group sessions per assessed needs and concerns.   Plan:   Patient Instructions  Remember to fill half of your lunch/dinner plate with veggies. Continue to focus on lean sources of protein and high-fiber carbohydrate foods. Make sure to drink plenty of water. Good job with watching your portion sizes! Be sure to take time for yourself and manage stress as much as possible.   Expected Outcomes:  Demonstrated interest in learning. Expect positive outcomes  Education material provided: My Plate, Meal Ideas, Breakfast Ideas  If problems or questions, patient to contact team via:  Phone and Email  Future DSME appointment: PRN

## 2020-07-07 NOTE — Patient Instructions (Signed)
Remember to fill half of your lunch/dinner plate with veggies. Continue to focus on lean sources of protein and high-fiber carbohydrate foods. Make sure to drink plenty of water. Good job with watching your portion sizes! Be sure to take time for yourself and manage stress as much as possible.

## 2020-08-05 ENCOUNTER — Other Ambulatory Visit: Payer: Self-pay

## 2020-08-05 ENCOUNTER — Ambulatory Visit (INDEPENDENT_AMBULATORY_CARE_PROVIDER_SITE_OTHER): Payer: Medicare Other

## 2020-08-05 DIAGNOSIS — Z23 Encounter for immunization: Secondary | ICD-10-CM | POA: Diagnosis not present

## 2020-08-15 ENCOUNTER — Encounter: Payer: Self-pay | Admitting: Family Medicine

## 2020-08-21 ENCOUNTER — Encounter: Payer: Self-pay | Admitting: Family Medicine

## 2020-08-21 DIAGNOSIS — M48061 Spinal stenosis, lumbar region without neurogenic claudication: Secondary | ICD-10-CM

## 2020-08-21 NOTE — Addendum Note (Signed)
Addended by: Abbe Amsterdam C on: 08/21/2020 01:44 PM   Modules accepted: Orders

## 2020-09-05 ENCOUNTER — Other Ambulatory Visit: Payer: Self-pay

## 2020-09-05 ENCOUNTER — Ambulatory Visit (INDEPENDENT_AMBULATORY_CARE_PROVIDER_SITE_OTHER): Payer: Medicare Other | Admitting: Internal Medicine

## 2020-09-05 ENCOUNTER — Encounter: Payer: Self-pay | Admitting: Internal Medicine

## 2020-09-05 VITALS — BP 128/72 | HR 86 | Ht 60.0 in | Wt 289.2 lb

## 2020-09-05 DIAGNOSIS — E782 Mixed hyperlipidemia: Secondary | ICD-10-CM | POA: Diagnosis not present

## 2020-09-05 DIAGNOSIS — E1142 Type 2 diabetes mellitus with diabetic polyneuropathy: Secondary | ICD-10-CM | POA: Diagnosis not present

## 2020-09-05 LAB — LIPID PANEL
Cholesterol: 161 mg/dL (ref 0–200)
HDL: 49.3 mg/dL (ref >39.00)
NonHDL: 111.7
Total CHOL/HDL Ratio: 3
Triglycerides: 220 mg/dL — ABNORMAL HIGH (ref 0.0–149.0)
VLDL: 44 mg/dL — ABNORMAL HIGH (ref 0.0–40.0)

## 2020-09-05 LAB — LDL CHOLESTEROL, DIRECT: Direct LDL: 92 mg/dL

## 2020-09-05 LAB — POCT GLYCOSYLATED HEMOGLOBIN (HGB A1C): Hemoglobin A1C: 6.8 % — AB (ref 4.0–5.6)

## 2020-09-05 NOTE — Progress Notes (Signed)
Patient ID: Nicole Mccann, female   DOB: May 22, 1946, 74 y.o.   MRN: [id number]   This visit occurred during the SARS-CoV-2 public health emergency.  Safety protocols were in place, including screening questions prior to the visit, additional usage of staff PPE, and extensive cleaning of exam room while observing appropriate contact time as indicated for disinfecting solutions.   HPI: Nicole Mccann is a 74 y.o.-year-old female, presenting for follow-up for DM2, dx in initially in 1965 with GDM, then again GDM in 1974, non-insulin-dependent, controlled, with long-term complications (PN).  Last visit  4 months ago.  She is here with her daughter who offers part of the history especially related to blood sugars, activity, and symptoms.  Problems with who offers part of the hx. especially related to her blood sugars, activity, and symptoms.  Reviewed HbA1c levels: Lab Results  Component Value Date   HGBA1C 6.6 (A) 05/02/2020   HGBA1C 6.3 (A) 01/09/2019   HGBA1C 5.8 (A) 09/05/2018   HGBA1C 6.3 (A) 04/18/2018   HGBA1C 6.4 12/12/2017   HGBA1C 6.2 02/09/2017   HGBA1C 6.1 01/21/2016   HGBA1C 6.0 (H) 07/08/2012   HGBA1C 6.1 (H) 07/07/2012   HGBA1C 5.8 02/13/2010   HGBA1C 6.0 06/22/2007   HGBA1C 6.0 03/20/2007   At last visit, she complained of diarrhea with Metformin ER -she was only taking it when the sugars are high.  We started: -Glipizide XL 2.5 mg before breakfast -she feelt that this is also causing GI symptoms - stopped (!). Retrospectively, this may have been caused by Ibuprofen. SGLT2 inh were very expensive.   Pt checks her sugars once a day: - am: 117, 126-144, 158 >> 114-142 >> 120-145, 150 >> 101-157 - 2h after b'fast: n/c >> 186, 198 - before lunch: 104, 123-133 >> 104 >> 113-170 >> 130-150 (combined L + D) - 2h after lunch: n/c >> 133 >> n/c - before dinner: n/c >> 141 >> 108-142 >> 138 >> n/c >> 148 - 2h after dinner: n/c >> 127-139 >> n/c - bedtime: n/c -  nighttime: n/c Lowest sugar was 90 >> 108 >> 113 >> 101; she has hypoglycemia awareness in the 70s. Highest: 158 >> 142 >> 170 >> 198.  Glucometer: One Touch Verio  Pt's meals are: - Breakfast: cereals, yoghurt (Activia), pastries, tea, honey - Lunch: salad, leftover sandwich, fruit - Dinner: meat, veggies, potato - Snacks: Sweets, she has wine every day, occasionally sweet liquor  -No CKD, last BUN/creatinine:  Lab Results  Component Value Date   BUN 15 05/02/2020   BUN 16 12/12/2017   CREATININE 0.81 05/02/2020   CREATININE 0.88 12/12/2017   -+HL; last set of lipids: Lab Results  Component Value Date   CHOL 247 (H) 05/02/2020   HDL 48.10 05/02/2020   LDLCALC 154 02/09/2017   LDLDIRECT 173.0 05/02/2020   TRIG 236.0 (H) 05/02/2020   CHOLHDL 5 05/02/2020  She refused statins in the past.  At last visit I suggested rosuvastatin 10 mg daily. She is taking this now. - last eye exam was in 12/2018: No DR >> coming up next week. - no numbness and tingling in her feet.  Pt has FH of DM in aunts.  She has OSA and is on a CPAP.  In summer 2019, she had hAs >> temporal artery Bx negative. She developed anaphylaxis to Vancomycin while in the hospital.  ROS: Constitutional: no weight gain/no weight loss, no fatigue, no subjective hyperthermia, no subjective hypothermia Eyes: no blurry vision, no  xerophthalmia ENT: no sore throat, no nodules palpated in neck, no dysphagia, no odynophagia, no hoarseness Cardiovascular: no CP/no SOB/no palpitations/+ leg swelling Respiratory: no cough/no SOB/no wheezing Gastrointestinal: no N/no V/no D/no C/no acid reflux Musculoskeletal: no muscle aches/no joint aches Skin: no rashes, no hair loss Neurological: no tremors/no numbness/no tingling/no dizziness  I reviewed pt's medications, allergies, PMH, social hx, family hx, and changes were documented in the history of present illness. Otherwise, unchanged from my initial visit note.  Past  Medical History:  Diagnosis Date  . Allergy   . Anxiety    [name]  . Asthma   . Atypical seizure (HCC) 06/07/2012  . Depression    [name]  . Diabetes mellitus   . DVT (deep venous thrombosis) (HCC)   . Headache(784.0)   . Hyperlipidemia   . Hypersomnia with sleep apnea, unspecified 05/22/2014  . Low blood pressure   . Olfactory aura   . OSA on CPAP   . Shingles 07/2013   Past Surgical History:  Procedure Laterality Date  . APPENDECTOMY    . ARTERY BIOPSY Left 04/14/2018   Procedure: BIOPSY TEMPORAL ARTERY;  Surgeon: Nicole Kerns, MD;  Location: [facility] OR;  Service: Vascular;  Laterality: Left;  . BREAST EXCISIONAL BIOPSY Right 1998  . CHOLECYSTECTOMY    . COLONOSCOPY  2006   [facility]  . rotator cuff surgery    . TONSILLECTOMY      Social History   Socioeconomic History  . Marital status: Married    Spouse name: Nicole Mccann  . Number of children: 2  . Years of education: College  . Highest education level: Not on file  Occupational History  . Not on file  Tobacco Use  . Smoking status: Never Smoker  . Smokeless tobacco: Never Used  Substance and Sexual Activity  . Alcohol use: Yes    Alcohol/week: 3.0 standard drinks    Types: 3 Glasses of wine per week    Comment: 1/2 glass wine daily  . Drug use: No  . Sexual activity: Never  Other Topics Concern  . Not on file  Social History Narrative   Patient is married Nicole Mccann) and lives at home with her husband.   Patient has two adult children.   Patient is retired.   Patient has a college education.   Patient is right-handed.   Patient drinks one cup of tea daily.   Social Determinants of Health   Financial Resource Strain: Low Risk   . Difficulty of Paying Living Expenses: Not hard at all  Food Insecurity: No Food Insecurity  . Worried About Programme researcher, broadcasting/film/video in the Last Year: Never true  . Ran Out of Food in the Last Year: Never true  Transportation Needs: No Transportation Needs  . Lack of Transportation  (Medical): No  . Lack of Transportation (Non-Medical): No  Physical Activity:   . Days of Exercise per Week: Not on file  . Minutes of Exercise per Session: Not on file  Stress:   . Feeling of Stress : Not on file  Social Connections:   . Frequency of Communication with Friends and Family: Not on file  . Frequency of Social Gatherings with Friends and Family: Not on file  . Attends Religious Services: Not on file  . Active Member of Clubs or Organizations: Not on file  . Attends Banker Meetings: Not on file  . Marital Status: Not on file  Intimate Partner Violence:   . Fear of  Current or Ex-Partner: Not on file  . Emotionally Abused: Not on file  . Physically Abused: Not on file  . Sexually Abused: Not on file   Current Outpatient Medications on File Prior to Visit  Medication Sig Dispense Refill  . albuterol (VENTOLIN HFA) 108 (90 Base) MCG/ACT inhaler Inhale 2 puffs into the lungs every 6 (six) hours as needed for wheezing or shortness of breath. 18 g 6  . albuterol (VENTOLIN) (5 MG/ML) 0.5% NEBU     . Bacillus Coagulans-Inulin (PROBIOTIC) 1-250 BILLION-MG CAPS     . Cholecalciferol (VITAMIN D) 50 MCG (2000 UT) CAPS     . gabapentin (NEURONTIN) 100 MG capsule Take 1 capsule (100 mg total) by mouth 3 (three) times daily. 90 capsule 3  . glipiZIDE (GLUCOTROL XL) 2.5 MG 24 hr tablet Take 1 tablet (2.5 mg total) by mouth daily with breakfast. 90 tablet 3  . glucose blood test strip Use as instructed 2x a day - One Touch Verio 200 each 3  . OVER THE COUNTER MEDICATION Lidocaine 4% pain relieving gel-patch. Apply one patch to affected area no more than 3 to 4 times daily    . rosuvastatin (CRESTOR) 10 MG tablet Take 1 tablet (10 mg total) by mouth daily. 90 tablet 3   No current facility-administered medications on file prior to visit.   Allergies  Allergen Reactions  . Mushroom Ext Cmplx(Shiitake-Reishi-Mait) Anaphylaxis  . Shellfish Allergy Anaphylaxis  . Sulfonamide  Derivatives Anaphylaxis  . Vancomycin Anaphylaxis  . Fluticasone-Salmeterol Other (See Comments)    Feels like something in the throat  . Butalbital-Aspirin-Caffeine Other (See Comments)    UNSPECIFIED REACTION   . Montelukast Sodium Other (See Comments)    Feels like she's running out of breath  . Penicillins     UNSPECIFIED REACTION      Childhood allergy Has patient had a PCN reaction causing immediate rash, facial/tongue/throat swelling, SOB or lightheadedness with hypotension: Unknown Has patient had a PCN reaction causing severe rash involving mucus membranes or skin necrosis: Unknown Has patient had a PCN reaction that required hospitalization: No Has patient had a PCN reaction occurring within the last 10 years: No If all of the above answers are "NO", then may proceed with Cephalosporin use.    . Acetaminophen Other (See Comments)    Stomach discomfort  . Clarithromycin Nausea And Vomiting  . Iodinated Diagnostic Agents Other (See Comments)    Bad headache; no prep needed  . Levofloxacin Diarrhea and Nausea And Vomiting    Dizziness Believes it was due to a high dosage of this medication  . Orphenadrine Nausea And Vomiting   Family History  Problem Relation Age of Onset  . Stomach cancer Father   . Coronary artery disease Father   . Breast cancer Sister 27       breast cancer  . Diabetes Maternal Aunt   . Diabetes Paternal Aunt   . Prostate cancer Brother   . Cancer Daughter   . Stroke Neg Hx    PE: BP 128/72   Pulse 86   Ht 5' (1.524 m)   Wt 289 lb 3.2 oz (131.2 kg)   SpO2 96%   BMI 56.48 kg/m  Wt Readings from Last 3 Encounters:  09/05/20 289 lb 3.2 oz (131.2 kg)  05/22/20 287 lb (130.2 kg)  05/22/20 287 lb 9.6 oz (130.5 kg)   Constitutional: overweight, in NAD, in wheelchair Eyes: PERRLA, EOMI, no exophthalmos ENT: moist mucous membranes, no thyromegaly, no cervical  lymphadenopathy Cardiovascular: RRR, No MRG Respiratory: CTA B Gastrointestinal:  abdomen soft, NT, ND, BS+ Musculoskeletal: no deformities, strength intact in all 4 Skin: moist, warm, + stasis dermatitis bilateral lower legs Neurological: no tremor with outstretched hands, DTR normal in all 4  ASSESSMENT: 1. 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  Obesity class III  4. HL  PLAN:  1. Patient with fairly well-controlled type 2 diabetes, prev. On Metformin IR and ER, which she was taking inconsistently 2/2 diarrhea. At last OV,  HbA1c was higher as she was not more stressed with her husband just being diagnosed with dementia.  She also gained 14 pounds before last visit.  She tried to take Metformin to bring her sugars down but she had fecal incontinence and significant diarrhea with this.  I advised her to throw away the Metformin and started low-dose glipizide XL.  PCP also referred her to nutrition recently. I have referred her in the past but she did not schedule an appt. -At this visit, she tells me that she feels that even the glipizide XL is causing her to have GI symptoms.  Retrospectively, she had her daughter tend to believe that the abdominal symptoms are actually caused by a protein bar that she was eating in the morning.  She would like to retry the glipizide, especially since her sugars are above target many times at different times of the day.  We did discuss about other options, but these are not too many: SGLT2 inhibitors were too expensive in the past.   GLP-1 receptor agonist may upset her stomach more.  Also, she refuses injectables. Actos may cause some more swelling.   She could not tolerate Metformin. She refuses insulin. -Daughter mentions that she would like to look into SGLT2 inhibitors again.  I gave her the names of the ones that we could use.  For now, however, will retry glipizide XL.  If this is not tolerated, we can try meglitinides. - I suggested to:  Patient Instructions  Please  restart: - Glipizide XL 2.5 mg daily 15-30 min before b'fast.  Please check with your insurance if the following are covered: - Farxiga - Jardiance - Invokana - Steglatro  Please return in 4 months with your sugar log.  - we checked her HbA1c: 6.8% (higher) - advised to check sugars at different times of the day - 1x a day, rotating check times - advised for yearly eye exams >> she is not UTD but has an appointment scheduled - return to clinic in 4 months  2. PN -Due to diabetes -Stable -sees neurology for this  3.  Obesity class III - I referred her to nutrition in the past but she did not nutritionist.  PCP referred her again to nutrition at last visit. -Before last visit she gained 14 pounds as she was quite immobile -She gained 2 pounds since last visit  4. HL -Reviewed latest lipid panel from 04/2020:LDL  Very high, TG also high: Lab Results  Component Value Date   CHOL 247 (H) 05/02/2020   HDL 48.10 05/02/2020   LDLCALC 154 02/09/2017   LDLDIRECT 173.0 05/02/2020   TRIG 236.0 (H) 05/02/2020   CHOLHDL 5 05/02/2020  -She refused statins in the past.  At last visit, since the LDL was still high, and higher than before, I again recommended to start Crestor 10.  She did start this and tolerated well -We will recheck a  lipid panel today (nonfasting).  Component     Latest Ref Rng & Units 09/05/2020  Cholesterol     0 - 200 mg/dL 779  Triglycerides     0 - 149 mg/dL 390.3 (H)  HDL Cholesterol     >39.00 mg/dL 00.92  VLDL     0.0 - 33.0 mg/dL 07.6 (H)  Total CHOL/HDL Ratio      3  NonHDL      111.70  Direct LDL     mg/dL 22.6   LDL much improved on Crestor 10.  Triglycerides high >> will suggest to add Fish oil 1 capsule a day.  Carlus Pavlov, MD PhD [facility] Endocrinology

## 2020-09-05 NOTE — Patient Instructions (Addendum)
Please restart: - Glipizide XL 2.5 mg daily 15-30 min before b'fast.  Please check with your insurance if the following are covered: - Farxiga - Jardiance - Invokana - Steglatro  Please return in 4 months with your sugar log.

## 2020-09-10 ENCOUNTER — Other Ambulatory Visit: Payer: Self-pay | Admitting: Family Medicine

## 2020-09-10 DIAGNOSIS — E1142 Type 2 diabetes mellitus with diabetic polyneuropathy: Secondary | ICD-10-CM

## 2020-09-11 LAB — HM DIABETES EYE EXAM

## 2020-09-12 ENCOUNTER — Encounter: Payer: Self-pay | Admitting: Family Medicine

## 2020-09-12 NOTE — Progress Notes (Signed)
Received results from [facility]. Results abstracted.

## 2020-10-06 ENCOUNTER — Other Ambulatory Visit: Payer: Self-pay | Admitting: Family Medicine

## 2020-10-06 DIAGNOSIS — Z1231 Encounter for screening mammogram for malignant neoplasm of breast: Secondary | ICD-10-CM

## 2020-11-04 ENCOUNTER — Encounter: Payer: Self-pay | Admitting: Family Medicine

## 2020-11-05 ENCOUNTER — Encounter: Payer: Self-pay | Admitting: Family Medicine

## 2020-11-05 ENCOUNTER — Ambulatory Visit (HOSPITAL_BASED_OUTPATIENT_CLINIC_OR_DEPARTMENT_OTHER)
Admission: RE | Admit: 2020-11-05 | Discharge: 2020-11-05 | Disposition: A | Discharge status: Home / Self Care | Payer: Medicare Other | Source: Ambulatory Visit | Attending: Family Medicine | Admitting: Family Medicine

## 2020-11-05 ENCOUNTER — Ambulatory Visit: Payer: Medicare Other | Admitting: Family Medicine

## 2020-11-05 ENCOUNTER — Other Ambulatory Visit: Payer: Self-pay

## 2020-11-05 VITALS — BP 122/80 | HR 80 | Resp 20 | Wt 291.4 lb

## 2020-11-05 DIAGNOSIS — J4520 Mild intermittent asthma, uncomplicated: Secondary | ICD-10-CM

## 2020-11-05 DIAGNOSIS — R062 Wheezing: Secondary | ICD-10-CM | POA: Insufficient documentation

## 2020-11-05 DIAGNOSIS — R2241 Localized swelling, mass and lump, right lower limb: Secondary | ICD-10-CM | POA: Insufficient documentation

## 2020-11-05 LAB — BASIC METABOLIC PANEL
BUN: 21 mg/dL (ref 6–23)
CO2: 29 mEq/L (ref 19–32)
Calcium: 9.1 mg/dL (ref 8.4–10.5)
Chloride: 101 mEq/L (ref 96–112)
Creatinine, Ser: 0.89 mg/dL (ref 0.40–1.20)
GFR: 62.78 mL/min (ref >60.00)
Glucose, Bld: 167 mg/dL — ABNORMAL HIGH (ref 70–99)
Potassium: 4.5 mEq/L (ref 3.5–5.1)
Sodium: 140 mEq/L (ref 135–145)

## 2020-11-05 LAB — BRAIN NATRIURETIC PEPTIDE: Pro B Natriuretic peptide (BNP): 56 pg/mL (ref 0.0–100.0)

## 2020-11-05 MED ORDER — ALBUTEROL SULFATE HFA 108 (90 BASE) MCG/ACT IN AERS: 2.0000 | INHALATION_SPRAY | Freq: Four times a day (QID) | RESPIRATORY_TRACT | 6 refills | Status: DC | PRN

## 2020-11-05 MED ORDER — DOXYCYCLINE HYCLATE 100 MG PO CAPS: 100.0000 mg | ORAL_CAPSULE | Freq: Two times a day (BID) | ORAL | 0 refills | Status: DC

## 2020-11-05 MED ORDER — FUROSEMIDE 20 MG PO TABS: 20.0000 mg | ORAL_TABLET | Freq: Every day | ORAL | 3 refills | Status: DC

## 2020-11-05 NOTE — Progress Notes (Signed)
[facility] [address] [place], [place] [address] [phone]  Date:  11/05/2020   Name:  Nicole Mccann   DOB:  May 21, 1946   MRN:  [id number]  PCP:  Pearline Cables, MD    Chief Complaint: Leg Pain (Swelling, redness and warmth in legs and feet since the weekend. Has been elevating the feet, cool compresses-some relief. )   History of Present Illness:  Nicole Mccann is a 74 y.o. very pleasant female patient who presents with the following:  Pt here today with concern of leg pain and redness- she has history of venous insufficieny and infections intermittently Swelling worse since Friday- today is Wednesday  They tried some cold compreses on her legs which did help- her legs today look better than the photos they had sent me She has been in bed with her legs elevated - this helped her legs but increased her chest congestion  History of diabetes with polyneuropathy, obesity, hyperlipidemia, seizure disorder, sleep apnea on CPAP, venous insufficiency with swelling of bilateral lower extremities No fever noted   Wt Readings from Last 3 Encounters:  11/05/20 291 lb 6.4 oz (132.2 kg)  09/05/20 289 lb 3.2 oz (131.2 kg)  05/22/20 287 lb (130.2 kg)   Weight is relatively stable She is using her inhaler a couple of times a day   Echo 2018- ok No cough   Patient Active Problem List   Diagnosis Date Noted  . Mixed hyperlipidemia 07/10/2019  . Dizziness and giddiness 12/22/2016  . Osteopenia 09/21/2016  . Low back pain 05/26/2016  . Right shoulder pain 04/04/2015  . Adjustment disorder with mixed anxiety and depressed mood 03/14/2015  . Rotator cuff (capsule) sprain 03/13/2015  . Pseudoptosis 11/18/2014  . Seizure disorder, temporal lobe, intractable (HCC) 10/17/2014  . 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 Severe obesity (BMI >= 40) (HCC) 10/17/2014  . OSA on CPAP 10/17/2014  . Hypersomnia with  sleep apnea, unspecified 05/22/2014  . Injury of left knee 08/22/2013  . Variants of migraine, not elsewhere classified, without mention of intractable migraine without mention of status migrainosus 05/22/2013  . 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 Spinal stenosis of lumbar region 12/17/2012  . Pulmonary embolism (HCC) 07/09/2012  . DVT (deep venous thrombosis) (HCC) 07/07/2012  . Atypical seizure (HCC) 06/07/2012  . Phlebitis following infusion 06/05/2012  . BENIGN POSITIONAL VERTIGO 11/06/2009  . Type 2 diabetes, controlled, with peripheral neuropathy (HCC) 04/04/2009  . ALLERGIC RHINITIS 04/04/2009  . ASTHMA 04/04/2009  . Diverticulosis of colon 04/04/2009    Past Medical History:  Diagnosis Date  . Allergy   . Anxiety    Dr Madaline Guthrie  . Asthma   . Atypical seizure (HCC) 06/07/2012  . Depression    Dr Madaline Guthrie  . Diabetes mellitus   . DVT (deep venous thrombosis) (HCC)   . Headache(784.0)   . Hyperlipidemia   . Hypersomnia with sleep apnea, unspecified 05/22/2014  . Low blood pressure   . Olfactory aura   . OSA on CPAP   . Shingles 07/2013    Past Surgical History:  Procedure Laterality Date  . APPENDECTOMY    . ARTERY BIOPSY Left 04/14/2018   Procedure: BIOPSY TEMPORAL ARTERY;  Surgeon: Sherren Kerns, MD;  Location: [facility] OR;  Service: Vascular;  Laterality: Left;  . BREAST EXCISIONAL BIOPSY Right 1998  . CHOLECYSTECTOMY    . COLONOSCOPY  2006  [facility]  . rotator cuff surgery    . TONSILLECTOMY       Social History   Tobacco Use  . Smoking status: Never Smoker  . Smokeless tobacco: Never Used  Substance Use Topics  . Alcohol use: Yes    Alcohol/week: 3.0 standard drinks    Types: 3 Glasses of wine per week    Comment: 1/2 glass wine daily  . Drug use: No    Family History  Problem Relation Age of Onset  . Stomach cancer Father   . Coronary artery disease Father   . Breast cancer Sister 85       breast cancer  . Diabetes  Maternal Aunt   . Diabetes Paternal Aunt   . Prostate cancer Brother   . Cancer Daughter   . Stroke Neg Hx     Allergies  Allergen Reactions  . Mushroom Ext Cmplx(Shiitake-Reishi-Mait) Anaphylaxis  . Shellfish Allergy Anaphylaxis  . Sulfonamide Derivatives Anaphylaxis  . Vancomycin Anaphylaxis  . Fluticasone-Salmeterol Other (See Comments)    Feels like something in the throat  . Butalbital-Aspirin-Caffeine Other (See Comments)    UNSPECIFIED REACTION   . Montelukast Sodium Other (See Comments)    Feels like she's running out of breath  . Penicillins     UNSPECIFIED REACTION      Childhood allergy Has patient had a PCN reaction causing immediate rash, facial/tongue/throat swelling, SOB or lightheadedness with hypotension: Unknown Has patient had a PCN reaction causing severe rash involving mucus membranes or skin necrosis: Unknown Has patient had a PCN reaction that required hospitalization: No Has patient had a PCN reaction occurring within the last 10 years: No If all of the above answers are "NO", then may proceed with Cephalosporin use.    . Acetaminophen Other (See Comments)    Stomach discomfort  . Clarithromycin Nausea And Vomiting  . Iodinated Diagnostic Agents Other (See Comments)    Bad headache; no prep needed  . Levofloxacin Diarrhea and Nausea And Vomiting    Dizziness Believes it was due to a high dosage of this medication  . Orphenadrine Nausea And Vomiting    Medication list has been reviewed and updated.  Current Outpatient Medications on File Prior to Visit  Medication Sig Dispense Refill  . albuterol (VENTOLIN HFA) 108 (90 Base) MCG/ACT inhaler Inhale 2 puffs into the lungs every 6 (six) hours as needed for wheezing or shortness of breath. 18 g 6  . Bacillus Coagulans-Inulin (PROBIOTIC) 1-250 BILLION-MG CAPS     . Cholecalciferol (VITAMIN D) 50 MCG (2000 UT) CAPS     . gabapentin (NEURONTIN) 100 MG capsule Take 1 capsule (100 mg total) by mouth 3  (three) times daily. 270 capsule 0  . glipiZIDE (GLUCOTROL XL) 2.5 MG 24 hr tablet Take 1 tablet (2.5 mg total) by mouth daily with breakfast. 90 tablet 3  . OVER THE COUNTER MEDICATION Lidocaine 4% pain relieving gel-patch. Apply one patch to affected area no more than 3 to 4 times daily    . rosuvastatin (CRESTOR) 10 MG tablet Take 1 tablet (10 mg total) by mouth daily. 90 tablet 3  . albuterol (VENTOLIN) (5 MG/ML) 0.5% NEBU      No current facility-administered medications on file prior to visit.    Review of Systems:  As per HPI- otherwise negative.   Physical Examination: Vitals:   11/05/20 1110  BP: 122/80  Pulse: 80  Resp: 20  SpO2: 95%   Vitals:   11/05/20 1110  Weight:  291 lb 6.4 oz (132.2 kg)   Body mass index is 56.91 kg/m. Ideal Body Weight:    GEN: no acute distress.  Morbid obesity, no distress noted  HEENT: Atraumatic, Normocephalic.  Ears and Nose: No external deformity. CV: RRR, No M/G/R. No JVD. No thrill. No extra heart sounds. PULM: mild wheezing, no crackles, rhonchi. No retractions. No resp. distress. No accessory muscle use. ABD: S, NT, ND EXTR: No c/c,  Chronic stasis edema with mild dermatitis on bilateral legs.  Pt notes more tenderness on the right   PSYCH: Normally interactive. Conversant.    Assessment and Plan: Localized swelling of right lower leg - Plan: doxycycline (VIBRAMYCIN) 100 MG capsule, DG Chest 2 View, furosemide (LASIX) 20 MG tablet, Basic metabolic panel, B Nat Peptide, US Venous Img Lower Unilateral Right  Wheezing - Plan: doxycycline (VIBRAMYCIN) 100 MG capsule, DG Chest 2 View  Mild intermittent asthma, unspecified whether complicated - Plan: albuterol (VENTOLIN HFA) 108 (90 Base) MCG/ACT inhaler  Pt here today with concern of LE swelling and pain She has history of cellulitis assoc with stasis dermatitis Will start doxycycline RLE [place] to rule out DVT Will check chest film, BNP to make sure no pulmonary edema Pt declines  a steroid inhaler  They will let me know if getting worse at all, I will be in touch with her reports   Signed Abbe Amsterdam, MD  DG Chest 2 View  Result Date: 11/05/2020 CLINICAL DATA:  Cough and wheezing. Bilateral lower extremity swelling. EXAM: CHEST - 2 VIEW COMPARISON:  Multiple priors, most recent December 28, 2016. FINDINGS: The heart size and mediastinal contours are within normal limits. No new consolidation. Similar chronic atelectasis/scar at the left lung base. No visible pleural effusions or pneumothorax. No acute osseous abnormality. Right upper quadrant clips. IMPRESSION: No active cardiopulmonary disease. Electronically Signed   By: Feliberto Harts MD   On: 11/05/2020 12:38   US Venous Img Lower Unilateral Right  Result Date: 11/05/2020 CLINICAL DATA:  Right leg pain and swelling EXAM: RIGHT LOWER EXTREMITY VENOUS DOPPLER ULTRASOUND TECHNIQUE: Gray-scale sonography with compression, as well as color and duplex ultrasound, were performed to evaluate the deep venous system(s) from the level of the common femoral vein through the popliteal and proximal calf veins. COMPARISON:  11/09/2017 FINDINGS: VENOUS Normal compressibility of the common femoral, superficial femoral, and popliteal veins, as well as the visualized calf veins. Visualized portions of profunda femoral vein and great saphenous vein unremarkable. No filling defects to suggest DVT on grayscale or color Doppler imaging. Doppler waveforms show normal direction of venous flow, normal respiratory plasticity and response to augmentation. Limited views of the contralateral common femoral vein are unremarkable. OTHER None. Limitations: none IMPRESSION: 1. No evidence of deep venous thrombosis within the right lower extremity. Electronically Signed   By: Sharlet Salina M.D.   On: 11/05/2020 16:55   Results for orders placed or performed in visit on 11/05/20  Basic metabolic panel  Result Value Ref Range   Sodium 140 135 -  145 mEq/L   Potassium 4.5 3.5 - 5.1 mEq/L   Chloride 101 96 - 112 mEq/L   CO2 29 19 - 32 mEq/L   Glucose, Bld 167 (H) 70 - 99 mg/dL   BUN 21 6 - 23 mg/dL   Creatinine, Ser 0.34 0.40 - 1.20 mg/dL   GFR 74.25 >95.63 mL/min   Calcium 9.1 8.4 - 10.5 mg/dL  B Nat Peptide  Result Value Ref Range   Pro B  Natriuretic peptide (BNP) 56.0 0.0 - 100.0 pg/mL    Message to pt

## 2020-11-05 NOTE — Patient Instructions (Signed)
It was good to see you again today!  Please stop by the lab-  I am going to check on your kidney function and do a lab to look for any evidence of heart failure as the cause of your swelling Then please stop by imaging on the ground floor to have a chest film.  I would also like to get an ultrasound of your right leg to rule- out DVT; this can be done at 4pm   Doxycycline twice a day for possible infection in your legs.  This should also cover any bronchitis that is developing   Use the furosemide once a day as needed for swelling   Please let me know how you do!

## 2020-11-18 ENCOUNTER — Ambulatory Visit: Payer: Medicare Other

## 2020-11-23 NOTE — Progress Notes (Signed)
[facility] at [facility] [address] [place], [place] [address] [phone]  Date:  11/24/2020   Name:  Nicole Mccann   DOB:  08/18/1946   MRN:  [id number]  PCP:  Pearline Cables, MD    Chief Complaint: No chief complaint on file.   History of Present Illness:  Nicole Mccann is a 75 y.o. very pleasant female patient who presents with the following:  Virtual visit today for concern of 71-month follow-up Patient location is home, my location is home.  Patient identity confirmed with 2 factors, she gives consent for virtual visit today.  I spoke to her daughter on the phone today, not able to get video to work   Patient last seen by myself on December 29-at that time she had concern of leg pain and redness, history of cellulitis associated with stasis dermatitis.  We treated her with doxycycline and got an ultrasound to rule out DVT History of diabetes with polyneuropathy, obesity, hyperlipidemia, seizure disorder, sleep apnea on CPAP, venous insufficiency with swellingof bilateral lower extremities  Discussed with patient and her daughter  Since our last visit the patient has complaint of recurrent coccyx pain She saw ortho for this issue in the fall but did not follow-up  Lab Results  Component Value Date   HGBA1C 6.8 (A) 09/05/2020   She does not otherwise have any acute concerns today She feel like her leg cellulitis is improved   Patient Active Problem List   Diagnosis Date Noted  . Mixed hyperlipidemia 07/10/2019  . Dizziness and giddiness 12/22/2016  . Osteopenia 09/21/2016  . Low back pain 05/26/2016  . Right shoulder pain 04/04/2015  . Adjustment disorder with mixed anxiety and depressed mood 03/14/2015  . Rotator cuff (capsule) sprain 03/13/2015  . Pseudoptosis 11/18/2014  . Seizure disorder, temporal lobe, intractable (HCC) 10/17/2014  . 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 Severe obesity (BMI  >= 40) (HCC) 10/17/2014  . OSA on CPAP 10/17/2014  . Hypersomnia with sleep apnea, unspecified 05/22/2014  . Injury of left knee 08/22/2013  . Variants of migraine, not elsewhere classified, without mention of intractable migraine without mention of status migrainosus 05/22/2013  . 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 Spinal stenosis of lumbar region 12/17/2012  . Pulmonary embolism (HCC) 07/09/2012  . DVT (deep venous thrombosis) (HCC) 07/07/2012  . Atypical seizure (HCC) 06/07/2012  . Phlebitis following infusion 06/05/2012  . BENIGN POSITIONAL VERTIGO 11/06/2009  . Type 2 diabetes, controlled, with peripheral neuropathy (HCC) 04/04/2009  . ALLERGIC RHINITIS 04/04/2009  . ASTHMA 04/04/2009  . Diverticulosis of colon 04/04/2009    Past Medical History:  Diagnosis Date  . Allergy   . Anxiety    Dr Madaline Guthrie  . Asthma   . Atypical seizure (HCC) 06/07/2012  . Depression    Dr Madaline Guthrie  . Diabetes mellitus   . DVT (deep venous thrombosis) (HCC)   . Headache(784.0)   . Hyperlipidemia   . Hypersomnia with sleep apnea, unspecified 05/22/2014  . Low blood pressure   . Olfactory aura   . OSA on CPAP   . Shingles 07/2013    Past Surgical History:  Procedure Laterality Date  . APPENDECTOMY    . ARTERY BIOPSY Left 04/14/2018   Procedure: BIOPSY TEMPORAL ARTERY;  Surgeon: Sherren Kerns, MD;  Location: [facility] OR;  Service: Vascular;  Laterality: Left;  . BREAST EXCISIONAL BIOPSY Right 1998  . CHOLECYSTECTOMY    .  COLONOSCOPY  2006    [facility]  . rotator cuff surgery    . TONSILLECTOMY       Social History   Tobacco Use  . Smoking status: Never Smoker  . Smokeless tobacco: Never Used  Substance Use Topics  . Alcohol use: Yes    Alcohol/week: 3.0 standard drinks    Types: 3 Glasses of wine per week    Comment: 1/2 glass wine daily  . Drug use: No    Family History  Problem Relation Age of Onset  . Stomach cancer Father   . Coronary artery disease  Father   . Breast cancer Sister 26       breast cancer  . Diabetes Maternal Aunt   . Diabetes Paternal Aunt   . Prostate cancer Brother   . Cancer Daughter   . Stroke Neg Hx     Allergies  Allergen Reactions  . Mushroom Ext Cmplx(Shiitake-Reishi-Mait) Anaphylaxis  . Shellfish Allergy Anaphylaxis  . Sulfonamide Derivatives Anaphylaxis  . Vancomycin Anaphylaxis  . Fluticasone-Salmeterol Other (See Comments)    Feels like something in the throat  . Butalbital-Aspirin-Caffeine Other (See Comments)    UNSPECIFIED REACTION   . Montelukast Sodium Other (See Comments)    Feels like she's running out of breath  . Penicillins     UNSPECIFIED REACTION      Childhood allergy Has patient had a PCN reaction causing immediate rash, facial/tongue/throat swelling, SOB or lightheadedness with hypotension: Unknown Has patient had a PCN reaction causing severe rash involving mucus membranes or skin necrosis: Unknown Has patient had a PCN reaction that required hospitalization: No Has patient had a PCN reaction occurring within the last 10 years: No If all of the above answers are "NO", then may proceed with Cephalosporin use.    . Acetaminophen Other (See Comments)    Stomach discomfort  . Clarithromycin Nausea And Vomiting  . Iodinated Diagnostic Agents Other (See Comments)    Bad headache; no prep needed  . Levofloxacin Diarrhea and Nausea And Vomiting    Dizziness Believes it was due to a high dosage of this medication  . Orphenadrine Nausea And Vomiting    Medication list has been reviewed and updated.  Current Outpatient Medications on File Prior to Visit  Medication Sig Dispense Refill  . albuterol (VENTOLIN HFA) 108 (90 Base) MCG/ACT inhaler Inhale 2 puffs into the lungs every 6 (six) hours as needed for wheezing or shortness of breath. 18 g 6  . albuterol (VENTOLIN) (5 MG/ML) 0.5% NEBU     . Bacillus Coagulans-Inulin (PROBIOTIC) 1-250 BILLION-MG CAPS     . Cholecalciferol  (VITAMIN D) 50 MCG (2000 UT) CAPS     . doxycycline (VIBRAMYCIN) 100 MG capsule Take 1 capsule (100 mg total) by mouth 2 (two) times daily. 20 capsule 0  . furosemide (LASIX) 20 MG tablet Take 1 tablet (20 mg total) by mouth daily. Use as needed for swelling of legs 30 tablet 3  . gabapentin (NEURONTIN) 100 MG capsule Take 1 capsule (100 mg total) by mouth 3 (three) times daily. 270 capsule 0  . glipiZIDE (GLUCOTROL XL) 2.5 MG 24 hr tablet Take 1 tablet (2.5 mg total) by mouth daily with breakfast. 90 tablet 3  . OVER THE COUNTER MEDICATION Lidocaine 4% pain relieving gel-patch. Apply one patch to affected area no more than 3 to 4 times daily    . rosuvastatin (CRESTOR) 10 MG tablet Take 1 tablet (10 mg total) by mouth daily. 90 tablet 3  No current facility-administered medications on file prior to visit.    Review of Systems:  As per HPI- otherwise negative.   Physical Examination: There were no vitals filed for this visit. There were no vitals filed for this visit. There is no height or weight on file to calculate BMI. 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

## 2020-11-24 ENCOUNTER — Telehealth: Payer: Medicare Other | Admitting: Family Medicine

## 2020-11-24 ENCOUNTER — Ambulatory Visit: Payer: Medicare Other | Admitting: Family Medicine

## 2020-11-24 DIAGNOSIS — M533 Sacrococcygeal disorders, not elsewhere classified: Secondary | ICD-10-CM

## 2020-11-25 NOTE — Progress Notes (Addendum)
[facility] [address], [address] [place], [place] [address] [phone]  Date:  11/26/2020   Name:  Nicole Mccann   DOB:  Oct 27, 1946   MRN:  [id number]  PCP:  Pearline Cables, MD    Chief Complaint: coccyxdynia and Follow-up   History of Present Illness:  Nicole Mccann is a 75 y.o. very pleasant female patient who presents with the following:  Patient with history of significant past medical problems- here today to discuss recurrent coccyx pain We last visited on 12/29 for selling and redness of her leg  History of controlled diabetes with polyneuropathy, obesity, hyperlipidemia, seizure disorder, sleep apnea on CPAP, venous insufficiency with swellingof bilateral lower extremities  Foot exam:  Lab Results  Component Value Date   HGBA1C 6.8 (A) 09/05/2020   She is using diclofenac sometimes which does help  She also does have some tramadol which she does not generlaly like to use   She is having more headaches - this has been the case for a few days  Hurts more in the back, like in the base of her skull  She notes that she is sensitive to odors  She will use a netipot when this happens and it will help    Patient Active Problem List   Diagnosis Date Noted  . Mixed hyperlipidemia 07/10/2019  . Dizziness and giddiness 12/22/2016  . Osteopenia 09/21/2016  . Low back pain 05/26/2016  . Right shoulder pain 04/04/2015  . Adjustment disorder with mixed anxiety and depressed mood 03/14/2015  . Rotator cuff (capsule) sprain 03/13/2015  . Pseudoptosis 11/18/2014  . Seizure disorder, temporal lobe, intractable (HCC) 10/17/2014  . 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 Severe obesity (BMI >= 40) (HCC) 10/17/2014  . OSA on CPAP 10/17/2014  . Hypersomnia with sleep apnea, unspecified 05/22/2014  . Injury of left knee 08/22/2013  . Variants of migraine, not elsewhere classified, without mention of  intractable migraine without mention of status migrainosus 05/22/2013  . 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 Spinal stenosis of lumbar region 12/17/2012  . Pulmonary embolism (HCC) 07/09/2012  . DVT (deep venous thrombosis) (HCC) 07/07/2012  . Atypical seizure (HCC) 06/07/2012  . Phlebitis following infusion 06/05/2012  . BENIGN POSITIONAL VERTIGO 11/06/2009  . Type 2 diabetes, controlled, with peripheral neuropathy (HCC) 04/04/2009  . ALLERGIC RHINITIS 04/04/2009  . ASTHMA 04/04/2009  . Diverticulosis of colon 04/04/2009    Past Medical History:  Diagnosis Date  . Allergy   . Anxiety    Dr Madaline Guthrie  . Asthma   . Atypical seizure (HCC) 06/07/2012  . Depression    Dr Madaline Guthrie  . Diabetes mellitus   . DVT (deep venous thrombosis) (HCC)   . Headache(784.0)   . Hyperlipidemia   . Hypersomnia with sleep apnea, unspecified 05/22/2014  . Low blood pressure   . Olfactory aura   . OSA on CPAP   . Shingles 07/2013    Past Surgical History:  Procedure Laterality Date  . APPENDECTOMY    . ARTERY BIOPSY Left 04/14/2018   Procedure: BIOPSY TEMPORAL ARTERY;  Surgeon: Sherren Kerns, MD;  [facility];  Service: Vascular;  Laterality: Left;  . BREAST EXCISIONAL BIOPSY Right 1998  . CHOLECYSTECTOMY    . COLONOSCOPY  2006   [facility]  . rotator cuff surgery    . TONSILLECTOMY       Social History   Tobacco  Use  . Smoking status: Never Smoker  . Smokeless tobacco: Never Used  Substance Use Topics  . Alcohol use: Yes    Alcohol/week: 3.0 standard drinks    Types: 3 Glasses of wine per week    Comment: 1/2 glass wine daily  . Drug use: No    Family History  Problem Relation Age of Onset  . Stomach cancer Father   . Coronary artery disease Father   . Breast cancer Sister 60       breast cancer  . Diabetes Maternal Aunt   . Diabetes Paternal Aunt   . Prostate cancer Brother   . Cancer Daughter   . Stroke Neg Hx     Allergies  Allergen  Reactions  . Mushroom Ext Cmplx(Shiitake-Reishi-Mait) Anaphylaxis  . Shellfish Allergy Anaphylaxis  . Sulfonamide Derivatives Anaphylaxis  . Vancomycin Anaphylaxis  . Fluticasone-Salmeterol Other (See Comments)    Feels like something in the throat  . Butalbital-Aspirin-Caffeine Other (See Comments)    UNSPECIFIED REACTION   . Montelukast Sodium Other (See Comments)    Feels like she's running out of breath  . Penicillins     UNSPECIFIED REACTION      Childhood allergy Has patient had a PCN reaction causing immediate rash, facial/tongue/throat swelling, SOB or lightheadedness with hypotension: Unknown Has patient had a PCN reaction causing severe rash involving mucus membranes or skin necrosis: Unknown Has patient had a PCN reaction that required hospitalization: No Has patient had a PCN reaction occurring within the last 10 years: No If all of the above answers are "NO", then may proceed with Cephalosporin use.    . Acetaminophen Other (See Comments)    Stomach discomfort  . Clarithromycin Nausea And Vomiting  . Iodinated Diagnostic Agents Other (See Comments)    Bad headache; no prep needed  . Levofloxacin Diarrhea and Nausea And Vomiting    Dizziness Believes it was due to a high dosage of this medication  . Orphenadrine Nausea And Vomiting    Medication list has been reviewed and updated.  Current Outpatient Medications on File Prior to Visit  Medication Sig Dispense Refill  . albuterol (VENTOLIN HFA) 108 (90 Base) MCG/ACT inhaler Inhale 2 puffs into the lungs every 6 (six) hours as needed for wheezing or shortness of breath. 18 g 6  . Bacillus Coagulans-Inulin (PROBIOTIC) 1-250 BILLION-MG CAPS     . Cholecalciferol (VITAMIN D) 50 MCG (2000 UT) CAPS     . furosemide (LASIX) 20 MG tablet Take 1 tablet (20 mg total) by mouth daily. Use as needed for swelling of legs 30 tablet 3  . gabapentin (NEURONTIN) 100 MG capsule Take 1 capsule (100 mg total) by mouth 3 (three) times  daily. 270 capsule 0  . glipiZIDE (GLUCOTROL XL) 2.5 MG 24 hr tablet Take 1 tablet (2.5 mg total) by mouth daily with breakfast. 90 tablet 3  . OVER THE COUNTER MEDICATION Lidocaine 4% pain relieving gel-patch. Apply one patch to affected area no more than 3 to 4 times daily    . rosuvastatin (CRESTOR) 10 MG tablet Take 1 tablet (10 mg total) by mouth daily. 90 tablet 3  . doxycycline (VIBRAMYCIN) 100 MG capsule Take 1 capsule (100 mg total) by mouth 2 (two) times daily. (Patient not taking: Reported on 11/26/2020) 20 capsule 0   No current facility-administered medications on file prior to visit.    Review of Systems:  As per HPI- otherwise negative.   Physical Examination: Vitals:   11/26/20 1450  BP: 134/90  Pulse: 72  Resp: 20  Temp: 97.9 F (36.6 C)  SpO2: 98%   Vitals:   11/26/20 1450  Weight: 279 lb 6.4 oz (126.7 kg)  Height: 5' (1.524 m)   Body mass index is 54.57 kg/m. Ideal Body Weight: Weight in (lb) to have BMI = 25: 127.7  GEN: no acute distress. Obese, looks well  HEENT: Atraumatic, Normocephalic.  Ears and Nose: No external deformity. CV: RRR, No M/G/R. No JVD. No thrill. No extra heart sounds. PULM: CTA B, no wheezes, crackles, rhonchi. No retractions. No resp. distress. No accessory muscle use. ABD: S, NT, ND, +BS. No rebound. No HSM. EXTR: No c/c/e PSYCH: Normally interactive. Conversant.  Foot exam normal  Venous stasis dermatitis on both anterior shins -chronic hyperpigmentation.  Does not suggest cellulitis at this time She is able to stand, she is wearing a lidocaine patch over the coccyx area.  No redness or skin breakdown is appreciated.  Exam of the coccyx is otherwise difficult due to body habitus  BP Readings from Last 3 Encounters:  11/26/20 134/90  11/05/20 122/80  09/05/20 128/72     Assessment and Plan: Type 2 diabetes, controlled, with peripheral neuropathy (HCC) - Plan: CBC, Comprehensive metabolic panel, Hemoglobin  A1c  Coccydynia - Plan: diclofenac (VOLTAREN) 75 MG EC tablet  Venous stasis dermatitis of both lower extremities  Patient here today with concern of coccydynia.  She has been seen by an orthopedic specialist, was treated with oral Voltaren with some success.  I refilled this for her today.  Advised that is okay to use Voltaren but would not encourage her to take it twice a day for long periods of time due to possible gastritis.  She can use Voltaren as needed, also can use some of the tramadol that she has on hand.  Suggested that she try Voltaren gel as well  We discussed chronic venous stasis changes.  At this point I do not see any evidence of acute cellulitis  Will check A1c today to evaluate current diabetes control  This visit occurred during the SARS-CoV-2 public health emergency.  Safety protocols were in place, including screening questions prior to the visit, additional usage of staff PPE, and extensive cleaning of exam room while observing appropriate contact time as indicated for disinfecting solutions.     Signed Abbe Amsterdam, MD  Received her labs as below, 1/20-message to patient  Results for orders placed or performed in visit on 11/26/20  CBC  Result Value Ref Range   WBC 8.5 4.0 - 10.5 K/uL   RBC 4.66 3.87 - 5.11 Mil/uL   Platelets 207.0 150.0 - 400.0 K/uL   Hemoglobin 14.3 12.0 - 15.0 g/dL   HCT 24.5 80.9 - 98.3 %   MCV 92.0 78.0 - 100.0 fl   MCHC 33.3 30.0 - 36.0 g/dL   RDW 38.2 50.5 - 39.7 %  Comprehensive metabolic panel  Result Value Ref Range   Sodium 140 135 - 145 mEq/L   Potassium 4.6 3.5 - 5.1 mEq/L   Chloride 104 96 - 112 mEq/L   CO2 30 19 - 32 mEq/L   Glucose, Bld 100 (H) 70 - 99 mg/dL   BUN 16 6 - 23 mg/dL   Creatinine, Ser 6.73 0.40 - 1.20 mg/dL   Total Bilirubin 0.4 0.2 - 1.2 mg/dL   Alkaline Phosphatase 53 39 - 117 U/L   AST 20 0 - 37 U/L   ALT 25 0 - 35 U/L  Total Protein 6.6 6.0 - 8.3 g/dL   Albumin 4.2 3.5 - 5.2 g/dL   GFR 53.64  >68.03 mL/min   Calcium 9.4 8.4 - 10.5 mg/dL  Hemoglobin O1Y  Result Value Ref Range   Hgb A1c MFr Bld 6.5 4.6 - 6.5 %

## 2020-11-26 ENCOUNTER — Other Ambulatory Visit: Payer: Self-pay

## 2020-11-26 ENCOUNTER — Ambulatory Visit: Payer: Medicare Other | Admitting: Family Medicine

## 2020-11-26 ENCOUNTER — Encounter: Payer: Self-pay | Admitting: Family Medicine

## 2020-11-26 VITALS — BP 134/90 | HR 72 | Temp 97.9°F | Resp 20 | Ht 60.0 in | Wt 279.4 lb

## 2020-11-26 DIAGNOSIS — M533 Sacrococcygeal disorders, not elsewhere classified: Secondary | ICD-10-CM | POA: Diagnosis not present

## 2020-11-26 DIAGNOSIS — I872 Venous insufficiency (chronic) (peripheral): Secondary | ICD-10-CM | POA: Diagnosis not present

## 2020-11-26 DIAGNOSIS — E1142 Type 2 diabetes mellitus with diabetic polyneuropathy: Secondary | ICD-10-CM

## 2020-11-26 MED ORDER — DICLOFENAC SODIUM 75 MG PO TBEC: 75.0000 mg | DELAYED_RELEASE_TABLET | Freq: Two times a day (BID) | ORAL | 1 refills | Status: DC

## 2020-11-26 NOTE — Patient Instructions (Signed)
It was good to see you again today- take care and I will be in touch with your results asap Ok to use diclofenac by mouth but watch for stomach upset.  Try the diclofenac gel as well Tramadol when your pain is severe

## 2020-11-27 ENCOUNTER — Encounter: Payer: Self-pay | Admitting: Family Medicine

## 2020-11-27 LAB — CBC
HCT: 42.8 % (ref 36.0–46.0)
Hemoglobin: 14.3 g/dL (ref 12.0–15.0)
MCHC: 33.3 g/dL (ref 30.0–36.0)
MCV: 92 fl (ref 78.0–100.0)
Platelets: 207 10*3/uL (ref 150.0–400.0)
RBC: 4.66 Mil/uL (ref 3.87–5.11)
RDW: 13.8 % (ref 11.5–15.5)
WBC: 8.5 10*3/uL (ref 4.0–10.5)

## 2020-11-27 LAB — COMPREHENSIVE METABOLIC PANEL
ALT: 25 U/L (ref 0–35)
AST: 20 U/L (ref 0–37)
Albumin: 4.2 g/dL (ref 3.5–5.2)
Alkaline Phosphatase: 53 U/L (ref 39–117)
BUN: 16 mg/dL (ref 6–23)
CO2: 30 mEq/L (ref 19–32)
Calcium: 9.4 mg/dL (ref 8.4–10.5)
Chloride: 104 mEq/L (ref 96–112)
Creatinine, Ser: 0.8 mg/dL (ref 0.40–1.20)
GFR: 71.32 mL/min (ref >60.00)
Glucose, Bld: 100 mg/dL — ABNORMAL HIGH (ref 70–99)
Potassium: 4.6 mEq/L (ref 3.5–5.1)
Sodium: 140 mEq/L (ref 135–145)
Total Bilirubin: 0.4 mg/dL (ref 0.2–1.2)
Total Protein: 6.6 g/dL (ref 6.0–8.3)

## 2020-11-27 LAB — HEMOGLOBIN A1C: Hgb A1c MFr Bld: 6.5 % (ref 4.6–6.5)

## 2020-12-08 ENCOUNTER — Other Ambulatory Visit: Payer: Self-pay | Admitting: Family Medicine

## 2020-12-08 DIAGNOSIS — E1142 Type 2 diabetes mellitus with diabetic polyneuropathy: Secondary | ICD-10-CM

## 2020-12-09 DIAGNOSIS — R519 Headache, unspecified: Secondary | ICD-10-CM | POA: Insufficient documentation

## 2020-12-31 ENCOUNTER — Other Ambulatory Visit: Payer: Self-pay

## 2020-12-31 ENCOUNTER — Ambulatory Visit
Admission: RE | Admit: 2020-12-31 | Discharge: 2020-12-31 | Disposition: A | Discharge status: Home / Self Care | Payer: Medicare Other | Source: Ambulatory Visit | Attending: Family Medicine | Admitting: Family Medicine

## 2020-12-31 DIAGNOSIS — Z1231 Encounter for screening mammogram for malignant neoplasm of breast: Secondary | ICD-10-CM

## 2021-01-02 ENCOUNTER — Other Ambulatory Visit: Payer: Self-pay

## 2021-01-06 ENCOUNTER — Encounter: Payer: Self-pay | Admitting: Internal Medicine

## 2021-01-06 ENCOUNTER — Ambulatory Visit (INDEPENDENT_AMBULATORY_CARE_PROVIDER_SITE_OTHER): Payer: Medicare Other | Admitting: Internal Medicine

## 2021-01-06 ENCOUNTER — Other Ambulatory Visit: Payer: Self-pay

## 2021-01-06 VITALS — BP 130/82 | HR 82 | Ht 60.0 in | Wt 292.4 lb

## 2021-01-06 DIAGNOSIS — E1142 Type 2 diabetes mellitus with diabetic polyneuropathy: Secondary | ICD-10-CM | POA: Diagnosis not present

## 2021-01-06 DIAGNOSIS — E0842 Diabetes mellitus due to underlying condition with diabetic polyneuropathy: Secondary | ICD-10-CM

## 2021-01-06 DIAGNOSIS — E782 Mixed hyperlipidemia: Secondary | ICD-10-CM

## 2021-01-06 NOTE — Progress Notes (Signed)
Patient ID: Nicole Mccann, female   DOB: Jan 27, 1946, 75 y.o.   MRN: [id number]   This visit occurred during the SARS-CoV-2 public health emergency.  Safety protocols were in place, including screening questions prior to the visit, additional usage of staff PPE, and extensive cleaning of exam room while observing appropriate contact time as indicated for disinfecting solutions.   HPI: Nicole Mccann is a 75 y.o.-year-old female, presenting for follow-up for DM2, dx in initially in 1965 with GDM, then again GDM in 1974, non-insulin-dependent, controlled, with long-term complications (PN).  Last visit 4 months ago. She is here with her daughter who offers part of the history especially related to her blood sugars, diet, medications.  Since last visit, she figured out that the sweet liquor that she was drinking at night was giving her headaches and she just stopped 3 days ago. Headaches have resolved.  Reviewed HbA1c levels: Lab Results  Component Value Date   HGBA1C 6.5 11/26/2020   HGBA1C 6.8 (A) 09/05/2020   HGBA1C 6.6 (A) 05/02/2020   HGBA1C 6.3 (A) 01/09/2019   HGBA1C 5.8 (A) 09/05/2018   HGBA1C 6.3 (A) 04/18/2018   HGBA1C 6.4 12/12/2017   HGBA1C 6.2 02/09/2017   HGBA1C 6.1 01/21/2016   HGBA1C 6.0 (H) 07/08/2012   HGBA1C 6.1 (H) 07/07/2012   HGBA1C 5.8 02/13/2010   HGBA1C 6.0 06/22/2007   HGBA1C 6.0 03/20/2007   At last visit we restarted: - Glipizide XL 2.5 mg (approximately 40 minutes) before breakfast -she feelt that this is also causing GI symptoms in the past, but now tolerated well SGLT2 inh were very expensive.  We had to stop Metformin ER due to significant diarrhea.  Pt checks her sugars once a day: - am: 114-142 >> 120-145, 150 >> 101-157 >> n/c - 2h after b'fast: n/c >> 186, 198 - before lunch: 113-170 >> 130-150 (combined L + D) >> 144-184 - 2h after lunch: n/c >> 133 >> n/c - before dinner: 108-142 >> 138 >> n/c >> 148 >> 107, 140 - 2h after dinner: n/c >>  127-139 >> n/c - bedtime: n/c - nighttime: n/c Lowest sugar was 90 ... >> 101 >> 107; she has hypoglycemia awareness in the 70s. Highest: 158 ... >> 198 >> 184.  Glucometer: One Touch Verio  Pt's meals are: - Breakfast: cereals, yoghurt (Activia), pastries, tea, honey - Lunch: salad, leftover sandwich, fruit - Dinner: meat, veggies, potato - Snacks: Sweets, sweet liquor Nicole Mccann) >> stopped 3 days ago due to headaches  No CKD, last BUN/creatinine:  Lab Results  Component Value Date   BUN 16 11/26/2020   BUN 21 11/05/2020   CREATININE 0.80 11/26/2020   CREATININE 0.89 11/05/2020   + HL; last set of lipids: Lab Results  Component Value Date   CHOL 161 09/05/2020   HDL 49.30 09/05/2020   LDLCALC 154 02/09/2017   LDLDIRECT 92.0 09/05/2020   TRIG 220.0 (H) 09/05/2020   CHOLHDL 3 09/05/2020  She refused statins in the past but we were able to start rosuvastatin 10 mg daily, which she now tolerates well.  In 08/2020 I also suggested to add fish oil 1 capsule a day >> started. - last eye exam was in 09/2020: No DR - no numbness and tingling in her feet.  Pt has FH of DM in aunts.  She has OSA and is on a CPAP.  In summer 2019, she had hAs >> temporal artery Bx negative. She developed anaphylaxis to Vancomycin while in the hospital.  ROS: Constitutional: no weight gain/+ weight loss, no fatigue, no subjective hyperthermia, no subjective hypothermia Eyes: no blurry vision, no xerophthalmia ENT: no sore throat, no nodules palpated in neck, no dysphagia, no odynophagia, no hoarseness Cardiovascular: no CP/no SOB/no palpitations/+ leg swelling Respiratory: no cough/no SOB/no wheezing Gastrointestinal: no N/no V/no D/no C/no acid reflux Musculoskeletal: no muscle aches/no joint aches Skin: + Rash on legs, no hair loss Neurological: no tremors/no numbness/no tingling/no dizziness, + HAs >> resolved after stopping Nicole Mccann  I reviewed pt's medications, allergies, PMH, social hx,  family hx, and changes were documented in the history of present illness. Otherwise, unchanged from my initial visit note.  Past Medical History:  Diagnosis Date  . Allergy   . Anxiety    Dr Madaline Guthrie  . Asthma   . Atypical seizure (HCC) 06/07/2012  . Depression    Dr Madaline Guthrie  . Diabetes mellitus   . DVT (deep venous thrombosis) (HCC)   . Headache(784.0)   . Hyperlipidemia   . Hypersomnia with sleep apnea, unspecified 05/22/2014  . Low blood pressure   . Olfactory aura   . OSA on CPAP   . Shingles 07/2013   Past Surgical History:  Procedure Laterality Date  . APPENDECTOMY    . ARTERY BIOPSY Left 04/14/2018   Procedure: BIOPSY TEMPORAL ARTERY;  Surgeon: Sherren Kerns, MD;  Location: [facility] OR;  Service: Vascular;  Laterality: Left;  . BREAST EXCISIONAL BIOPSY Right 1998  . CHOLECYSTECTOMY    . COLONOSCOPY  2006   [facility]  . rotator cuff surgery    . TONSILLECTOMY      Social History   Socioeconomic History  . Marital status: Married    Spouse name: Lars Mage  . Number of children: 2  . Years of education: College  . Highest education level: Not on file  Occupational History  . Not on file  Tobacco Use  . Smoking status: Never Smoker  . Smokeless tobacco: Never Used  Substance and Sexual Activity  . Alcohol use: Yes    Alcohol/week: 3.0 standard drinks    Types: 3 Glasses of wine per week    Comment: 1/2 glass wine daily  . Drug use: No  . Sexual activity: Never  Other Topics Concern  . Not on file  Social History Narrative   Patient is married Lars Mage) and lives at home with her husband.   Patient has two adult children.   Patient is retired.   Patient has a college education.   Patient is right-handed.   Patient drinks one cup of tea daily.   Social Determinants of Health   Financial Resource Strain: Low Risk   . Difficulty of Paying Living Expenses: Not hard at all  Food Insecurity: No Food Insecurity  . Worried About Programme researcher, broadcasting/film/video in the Last Year:  Never true  . Ran Out of Food in the Last Year: Never true  Transportation Needs: No Transportation Needs  . Lack of Transportation (Medical): No  . Lack of Transportation (Non-Medical): No  Physical Activity: Not on file  Stress: Not on file  Social Connections: Not on file  Intimate Partner Violence: Not on file   Current Outpatient Medications on File Prior to Visit  Medication Sig Dispense Refill  . albuterol (VENTOLIN HFA) 108 (90 Base) MCG/ACT inhaler Inhale 2 puffs into the lungs every 6 (six) hours as needed for wheezing or shortness of breath. 18 g 6  . Bacillus Coagulans-Inulin (PROBIOTIC) 1-250 BILLION-MG CAPS     .  Cholecalciferol (VITAMIN D) 50 MCG (2000 UT) CAPS     . diclofenac (VOLTAREN) 75 MG EC tablet Take 1 tablet (75 mg total) by mouth 2 (two) times daily. Use as needed 60 tablet 1  . furosemide (LASIX) 20 MG tablet Take 1 tablet (20 mg total) by mouth daily. Use as needed for swelling of legs 30 tablet 3  . gabapentin (NEURONTIN) 100 MG capsule Take 1 capsule (100 mg total) by mouth 3 (three) times daily. 270 capsule 1  . glipiZIDE (GLUCOTROL XL) 2.5 MG 24 hr tablet Take 1 tablet (2.5 mg total) by mouth daily with breakfast. 90 tablet 3  . OVER THE COUNTER MEDICATION Lidocaine 4% pain relieving gel-patch. Apply one patch to affected area no more than 3 to 4 times daily    . rosuvastatin (CRESTOR) 10 MG tablet Take 1 tablet (10 mg total) by mouth daily. 90 tablet 3   No current facility-administered medications on file prior to visit.   Allergies  Allergen Reactions  . Mushroom Ext Cmplx(Shiitake-Reishi-Mait) Anaphylaxis  . Shellfish Allergy Anaphylaxis  . Sulfonamide Derivatives Anaphylaxis  . Vancomycin Anaphylaxis  . Fluticasone-Salmeterol Other (See Comments)    Feels like something in the throat  . Butalbital-Aspirin-Caffeine Other (See Comments)    UNSPECIFIED REACTION   . Montelukast Sodium Other (See Comments)    Feels like she's running out of breath   . Penicillins     UNSPECIFIED REACTION      Childhood allergy Has patient had a PCN reaction causing immediate rash, facial/tongue/throat swelling, SOB or lightheadedness with hypotension: Unknown Has patient had a PCN reaction causing severe rash involving mucus membranes or skin necrosis: Unknown Has patient had a PCN reaction that required hospitalization: No Has patient had a PCN reaction occurring within the last 10 years: No If all of the above answers are "NO", then may proceed with Cephalosporin use.    . Acetaminophen Other (See Comments)    Stomach discomfort  . Clarithromycin Nausea And Vomiting  . Iodinated Diagnostic Agents Other (See Comments)    Bad headache; no prep needed  . Levofloxacin Diarrhea and Nausea And Vomiting    Dizziness Believes it was due to a high dosage of this medication  . Orphenadrine Nausea And Vomiting   Family History  Problem Relation Age of Onset  . Stomach cancer Father   . Coronary artery disease Father   . Breast cancer Sister 35       breast cancer  . Diabetes Maternal Aunt   . Diabetes Paternal Aunt   . Prostate cancer Brother   . Cancer Daughter   . Stroke Neg Hx    PE: BP 130/82   Pulse 82   Ht 5' (1.524 m)   Wt 292 lb 6.4 oz (132.6 kg)   SpO2 95%   BMI 57.11 kg/m  Wt Readings from Last 3 Encounters:  01/06/21 292 lb 6.4 oz (132.6 kg)  11/26/20 279 lb 6.4 oz (126.7 kg)  11/05/20 291 lb 6.4 oz (132.2 kg)   Constitutional: overweight, in NAD, + in wheelchair Eyes: PERRLA, EOMI, no exophthalmos ENT: moist mucous membranes, no thyromegaly, no cervical lymphadenopathy Cardiovascular: RRR, No MRG Respiratory: CTA B Gastrointestinal: abdomen soft, NT, ND, BS+ Musculoskeletal: no deformities, strength intact in all 4 Skin: moist, warm, + stasis dermatitis bilateral legs Neurological: no tremor with outstretched hands, DTR normal in all 4  ASSESSMENT: 1. DM2, non-insulin-dependent, now more controlled, with  complications - PN  2. 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  HL  PLAN:  1. Patient with fairly well-controlled type 2 diabetes, previously on Metformin IR and ER, which she was taking inconsistently due to diarrhea and fecal incontinence.  We started low-dose glipizide XL but she stopped it before last visit as she felt that even glipizide XL was causing her to have GI symptoms. Retrospectively, her daughter felt that her GI symptoms may have been caused by a protein bar that she was eating in the morning.  Therefore, at last visit, we restarted glipizide XL low-dose.  PCP also referred her to nutrition. -At last visit we also discussed about other options: SGLT2 inhibitors were too expensive in the past.  I sent a new prescription for these to her pharmacy in 08/2020, but she was not able to start them due to price. GLP-1 receptor agonist may upset her stomach more.  She refused injectables. She refused insulin. Actos may cause some more swelling.   She could not tolerate Metformin. -However, her sugars improved with another HbA1c of 6.5%, improved. -At this visit, sugars are still high in the morning, but upon questioning, most of these were checked when she was drinking Bailey's sweet liquor at night. She did not have this last night and this morning her sugar was 144, lower than before. I strongly advised her to continue without the liquor. -Sugars later in the day are at goal but she is not checking frequently. Advised her to check some more later in the day. -Unfortunately, her insurance did not cover SGLT2 inhibitors (her daughter tells me they did call the insurance). So we need to continue with glipizide. - I suggested to:  Patient Instructions  Please continue: - Glipizide XL 2.5 mg daily 15-30 min before b'fast.  Please return in 4 months with your sugar log.  - advised to check sugars at different times of the day - 1x a day, rotating check times - advised for yearly eye exams >> she is UTD -  return to clinic in 4 months  2. PN -Due to diabetes -Stable -Managed by neurology  3. HL -Reviewed latest lipid panel from last visit: LDL above our goal of less than 70, triglycerides high: Lab Results  Component Value Date   CHOL 161 09/05/2020   HDL 49.30 09/05/2020   LDLCALC 154 02/09/2017   LDLDIRECT 92.0 09/05/2020   TRIG 220.0 (H) 09/05/2020   CHOLHDL 3 09/05/2020  -She refused statins in the past but currently on Crestor 10 tolerated well.  At last visit, since triglycerides were high, we added fish oil 1000 mg daily. -She will need a lipid panel at next visit   Carlus Pavlov, MD PhD [facility] Endocrinology

## 2021-01-06 NOTE — Patient Instructions (Addendum)
Please continue: - Glipizide XL 2.5 mg daily 15-30 min before b'fast.  Please return in 4 months with your sugar log.

## 2021-01-16 ENCOUNTER — Encounter: Payer: Self-pay | Admitting: Internal Medicine

## 2021-01-16 DIAGNOSIS — E1142 Type 2 diabetes mellitus with diabetic polyneuropathy: Secondary | ICD-10-CM

## 2021-01-22 MED ORDER — ROSUVASTATIN CALCIUM 10 MG PO TABS: 10.0000 mg | ORAL_TABLET | Freq: Every day | ORAL | 3 refills | Status: DC

## 2021-01-22 NOTE — Telephone Encounter (Signed)
REFILL REQUEST FOR:  crestor (90 day)  PHARMACY:  [facility] [place], [place] - [address], [address] Phone:  [phone]  Fax:  [phone]

## 2021-02-09 ENCOUNTER — Encounter: Payer: Self-pay | Admitting: Internal Medicine

## 2021-02-09 ENCOUNTER — Other Ambulatory Visit: Payer: Self-pay | Admitting: Internal Medicine

## 2021-02-09 DIAGNOSIS — E1142 Type 2 diabetes mellitus with diabetic polyneuropathy: Secondary | ICD-10-CM

## 2021-02-10 MED ORDER — GLIPIZIDE ER 2.5 MG PO TB24: ORAL_TABLET | ORAL | 2 refills | Status: DC

## 2021-02-11 ENCOUNTER — Ambulatory Visit: Payer: Medicare Other | Attending: Internal Medicine

## 2021-02-11 ENCOUNTER — Encounter: Payer: Self-pay | Admitting: Family Medicine

## 2021-02-11 DIAGNOSIS — Z23 Encounter for immunization: Secondary | ICD-10-CM

## 2021-02-11 NOTE — Progress Notes (Signed)
   Covid-19 Vaccination Clinic  Name:  MALLORY SCHAAD    MRN: [id number] DOB: [birth year]  02/11/2021  Ms. Kuznicki was observed post Covid-19 immunization for 15 minutes without incident. She was provided with Vaccine Information Sheet and instruction to access the V-Safe system.   Ms. Staron was instructed to call 911 with any severe reactions post vaccine: Marland Kitchen Difficulty breathing  . Swelling of face and throat  . A fast heartbeat  . A bad rash all over body  . Dizziness and weakness   Immunizations Administered    Name Date Dose VIS Date Route   Moderna Covid-19 Booster Vaccine 02/11/2021  1:47 PM 0.25 mL 08/27/2020 Intramuscular   Manufacturer: Gala Murdoch   Lot: [id number]   NDC: 70962-836-62

## 2021-02-17 ENCOUNTER — Other Ambulatory Visit (HOSPITAL_BASED_OUTPATIENT_CLINIC_OR_DEPARTMENT_OTHER): Payer: Self-pay

## 2021-02-17 MED ORDER — MODERNA COVID-19 VACCINE 100 MCG/0.5ML IM SUSP
INTRAMUSCULAR | 0 refills | Status: DC
  Filled 2021-02-17: qty 0.25, 1d supply, fill #0

## 2021-02-28 NOTE — Progress Notes (Signed)
[facility] [address] [place], [place] [address] [phone]  Date:  03/04/2021   Name:  Nicole Mccann   DOB:  [birth year]   MRN:  [id number]  PCP:  Pearline Cables, MD    Chief Complaint: Follow-up, Headache (Frequent headaches, monitoring oxygen levels at home. ), and Gastroesophageal Reflux   History of Present Illness:  Nicole Mccann is a 75 y.o. very pleasant female patient who presents with the following:  Here today for a follow-up visit.  Last seen by myself in January History of controlled diabetes with polyneuropathy, obesity, hyperlipidemia, seizure disorder, sleep apnea on CPAP, venous insufficiency with swellingof bilateral lower extremities  She also sees endocrinology for diabetes care, Dr. Ardyth Harps recent visit last month She has been tolerating Crestor 10 mg recently Lab Results  Component Value Date   HGBA1C 6.5 11/26/2020   Her daughter Nicole Mccann recently contacted me with the following concern Nicole Mccann had COVID-19, thankfully her parents did not get it  My mom has been having these headaches that she thought were related to her sugar intake when we went to see Dr Elvera Lennox on March 1st. However, they seem to be more related to a lack of airflow. She gets them when she goes up the stairs (which she had to do in this split-level house). She has been using her inhaler, a small fan directly aimed at her face, and, most-recenty, CBD drops to deal with the pain. She seems to be controlling it better now (it was much more worrisome a couple of months ago), but I wanted to let you know in case you thought it was something more serious.    Albuterol as needed Lasix 20 daily as needed Gabapentin 100 3 times daily Glipizide Crestor  She has felt SOB for about 3 months They thought this might be due to her glucose - she has been working on her diet however, eating less sugar and the symptoms  have not improved  She gets more SOB when she lays down at night-she has noted this for about 3 months as well However, she notes that her supine symptoms are relieved with use of antacids  She is getting some bilateral LE edema- not terrible, gets better overnight  Wt Readings from Last 3 Encounters:  03/04/21 291 lb (132 kg)  01/06/21 292 lb 6.4 oz (132.6 kg)  11/26/20 279 lb 6.4 oz (126.7 kg)  weight 7/21- 287 She will get tired going up stairs  They tried measuring her oxygen when she climbs stairs at home.  She did get down to about 90% but no lower - in fact, when she was feeling her worst her sats were 96%  Today [name] does admit that she is under a lot of stress and feeling sx of depression.  She has another daughter who is not doing well, who does not help with their care and she feels like there is too much stress on Paula's shoulders.  She would like to try some medication for depression due to these sx    Patient Active Problem List   Diagnosis Date Noted  . Mixed hyperlipidemia 07/10/2019  . Dizziness and giddiness 12/22/2016  . Osteopenia 09/21/2016  . Low back pain 05/26/2016  . Right shoulder pain 04/04/2015  . Adjustment disorder with mixed anxiety and depressed mood 03/14/2015  . Rotator cuff (capsule) sprain 03/13/2015  . Pseudoptosis 11/18/2014  . Seizure disorder, temporal lobe,  intractable (HCC) 10/17/2014  . 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 Severe obesity (BMI >= 40) (HCC) 10/17/2014  . OSA on CPAP 10/17/2014  . Hypersomnia with sleep apnea, unspecified 05/22/2014  . Injury of left knee 08/22/2013  . Variants of migraine, not elsewhere classified, without mention of intractable migraine without mention of status migrainosus 05/22/2013  . 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 Spinal stenosis of lumbar region 12/17/2012  . Pulmonary embolism (HCC) 07/09/2012  . DVT (deep venous thrombosis) (HCC) 07/07/2012  . Atypical seizure  (HCC) 06/07/2012  . Phlebitis following infusion 06/05/2012  . BENIGN POSITIONAL VERTIGO 11/06/2009  . Type 2 diabetes, controlled, with peripheral neuropathy (HCC) 04/04/2009  . ALLERGIC RHINITIS 04/04/2009  . ASTHMA 04/04/2009  . Diverticulosis of colon 04/04/2009    Past Medical History:  Diagnosis Date  . Allergy   . Anxiety    Dr Madaline Guthrie  . Asthma   . Atypical seizure (HCC) 06/07/2012  . Depression    Dr Madaline Guthrie  . Diabetes mellitus   . DVT (deep venous thrombosis) (HCC)   . Headache(784.0)   . Hyperlipidemia   . Hypersomnia with sleep apnea, unspecified 05/22/2014  . Low blood pressure   . Olfactory aura   . OSA on CPAP   . Shingles 07/2013    Past Surgical History:  Procedure Laterality Date  . APPENDECTOMY    . ARTERY BIOPSY Left 04/14/2018   Procedure: BIOPSY TEMPORAL ARTERY;  Surgeon: Sherren Kerns, MD;  Location: [facility] OR;  Service: Vascular;  Laterality: Left;  . BREAST EXCISIONAL BIOPSY Right 1998  . CHOLECYSTECTOMY    . COLONOSCOPY  2006   [facility]  . rotator cuff surgery    . TONSILLECTOMY       Social History   Tobacco Use  . Smoking status: Never Smoker  . Smokeless tobacco: Never Used  Substance Use Topics  . Alcohol use: Yes    Alcohol/week: 3.0 standard drinks    Types: 3 Glasses of wine per week    Comment: 1/2 glass wine daily  . Drug use: No    Family History  Problem Relation Age of Onset  . Stomach cancer Father   . Coronary artery disease Father   . Breast cancer Sister 32       breast cancer  . Diabetes Maternal Aunt   . Diabetes Paternal Aunt   . Prostate cancer Brother   . Cancer Daughter   . Stroke Neg Hx     Allergies  Allergen Reactions  . Mushroom Ext Cmplx(Shiitake-Reishi-Mait) Anaphylaxis  . Shellfish Allergy Anaphylaxis  . Sulfonamide Derivatives Anaphylaxis  . Vancomycin Anaphylaxis  . Fluticasone-Salmeterol Other (See Comments)    Feels like something in the throat  . Butalbital-Aspirin-Caffeine Other (See  Comments)    UNSPECIFIED REACTION   . Montelukast Sodium Other (See Comments)    Feels like she's running out of breath  . Penicillins     UNSPECIFIED REACTION      Childhood allergy Has patient had a PCN reaction causing immediate rash, facial/tongue/throat swelling, SOB or lightheadedness with hypotension: Unknown Has patient had a PCN reaction causing severe rash involving mucus membranes or skin necrosis: Unknown Has patient had a PCN reaction that required hospitalization: No Has patient had a PCN reaction occurring within the last 10 years: No If all of the above answers are "NO", then may proceed with Cephalosporin use.    . Acetaminophen Other (See Comments)    Stomach discomfort  .  Clarithromycin Nausea And Vomiting  . Iodinated Diagnostic Agents Other (See Comments)    Bad headache; no prep needed  . Levofloxacin Diarrhea and Nausea And Vomiting    Dizziness Believes it was due to a high dosage of this medication  . Orphenadrine Nausea And Vomiting    Medication list has been reviewed and updated.  Current Outpatient Medications on File Prior to Visit  Medication Sig Dispense Refill  . albuterol (VENTOLIN HFA) 108 (90 Base) MCG/ACT inhaler Inhale 2 puffs into the lungs every 6 (six) hours as needed for wheezing or shortness of breath. 18 g 6  . Bacillus Coagulans-Inulin (PROBIOTIC) 1-250 BILLION-MG CAPS     . Cholecalciferol (VITAMIN D) 50 MCG (2000 UT) CAPS     . fluticasone (FLONASE) 50 MCG/ACT nasal spray Place into both nostrils daily.    . furosemide (LASIX) 20 MG tablet Take 1 tablet (20 mg total) by mouth daily. Use as needed for swelling of legs 30 tablet 3  . gabapentin (NEURONTIN) 100 MG capsule Take 1 capsule (100 mg total) by mouth 3 (three) times daily. (Patient taking differently: Take 100 mg by mouth daily.) 270 capsule 1  . glipiZIDE (GLUCOTROL XL) 2.5 MG 24 hr tablet TAKE 1 TABLET BY MOUTH WITH BREAKFAST 90 tablet 2  . rosuvastatin (CRESTOR) 10 MG tablet  Take 1 tablet (10 mg total) by mouth daily. 90 tablet 3   No current facility-administered medications on file prior to visit.    Review of Systems:  As per HPI- otherwise negative.   Physical Examination: Vitals:   03/04/21 1018  BP: 126/90  Pulse: (!) 57  Resp: 17  SpO2: 97%   Vitals:   03/04/21 1018  Weight: 291 lb (132 kg)  Height: 5' (1.524 m)   Body mass index is 56.83 kg/m. Ideal Body Weight: Weight in (lb) to have BMI = 25: 127.7  GEN: no acute distress.  Obese, looks her normal self HEENT: Atraumatic, Normocephalic.  Ears and Nose: No external deformity. CV: RRR, No M/G/R. No JVD. No thrill. No extra heart sounds. PULM: CTA B, no wheezes, crackles, rhonchi. No retractions. No resp. distress. No accessory muscle use. ABD: S, NT, ND, +BS. No rebound. No HSM. EXTR: No c/c.  Minimal bilateral edema at the ankles PSYCH: Normally interactive. Conversant.   EKG: NSR, no significant change compared with EKG from 04/2018 Assessment and Plan: SOB (shortness of breath) - Plan: DG Chest 2 View, B Nat Peptide, Troponin I (High Sensitivity), Basic metabolic panel, TSH  Chest pain at rest - Plan: DG Chest 2 View, B Nat Peptide, Troponin I (High Sensitivity), EKG 12-Lead  Depression, recurrent (HCC) - Plan: FLUoxetine (PROZAC) 20 MG capsule  Patient seen today with concern of shortness of breath, she also has had which could be orthopnea but actually sounds more like GERD.  She does have some minimal lower extremity edema.  We will work-up for CHF with chest film and BNP Assuming this is normal, we will plan to have her see cardiology to evaluate her shortness of breath.  We will also check a troponin due to complaints of nonacute chest discomfort  [place] S[place] admits to having symptoms of depression today.  We will have her start on fluoxetine 20 mg, I have asked her to let me know how she responds to this in the next few weeks   This visit occurred during the SARS-CoV-2 public  health emergency.  Safety protocols were in place, including screening questions prior to the visit,  additional usage of staff PPE, and extensive cleaning of exam room while observing appropriate contact time as indicated for disinfecting solutions.    Signed Abbe Amsterdam, MD  Received her films and labs as below, message to patient DG Chest 2 View  Result Date: 03/04/2021 CLINICAL DATA:  Shortness of breath. EXAM: CHEST - 2 VIEW COMPARISON:  11/05/2020. FINDINGS: Mediastinum and hilar structures normal. Cardiomegaly. No pulmonary venous congestion. Low lung volumes with mild basilar atelectasis. No pleural effusion or pneumothorax. No acute bony abnormality. Surgical clips upper abdomen. IMPRESSION: 1.  Low lung volumes with mild basilar atelectasis. 2.  Cardiomegaly.  No pulmonary venous congestion. Electronically Signed   By: Maisie Fus  Register   On: 03/04/2021 12:20   Results for orders placed or performed in visit on 03/04/21  B Nat Peptide  Result Value Ref Range   Pro B Natriuretic peptide (BNP) 50.0 0.0 - 100.0 pg/mL  Basic metabolic panel  Result Value Ref Range   Sodium 139 135 - 145 mEq/L   Potassium 4.3 3.5 - 5.1 mEq/L   Chloride 103 96 - 112 mEq/L   CO2 28 19 - 32 mEq/L   Glucose, Bld 150 (H) 70 - 99 mg/dL   BUN 13 6 - 23 mg/dL   Creatinine, Ser 2.56 0.40 - 1.20 mg/dL   GFR 38.93 >73.42 mL/min   Calcium 9.3 8.4 - 10.5 mg/dL  TSH  Result Value Ref Range   TSH 4.34 0.35 - 4.50 uIU/mL  Troponin I (High Sensitivity)  Result Value Ref Range   High Sens Troponin I 6 2 - 17 ng/L

## 2021-02-28 NOTE — Patient Instructions (Addendum)
It was great to see you again today, as always We will check labs and a chest film for you today I will be in touch with your results asap We are going to have you try fluoxetine 20 mg daily for depression- please let me know how this works for you!

## 2021-03-04 ENCOUNTER — Ambulatory Visit (HOSPITAL_BASED_OUTPATIENT_CLINIC_OR_DEPARTMENT_OTHER)
Admission: RE | Admit: 2021-03-04 | Discharge: 2021-03-04 | Disposition: A | Discharge status: Home / Self Care | Payer: Medicare Other | Source: Ambulatory Visit | Attending: Family Medicine | Admitting: Family Medicine

## 2021-03-04 ENCOUNTER — Other Ambulatory Visit: Payer: Self-pay

## 2021-03-04 ENCOUNTER — Encounter: Payer: Self-pay | Admitting: Family Medicine

## 2021-03-04 ENCOUNTER — Ambulatory Visit: Payer: Medicare Other | Admitting: Family Medicine

## 2021-03-04 VITALS — BP 126/90 | HR 57 | Resp 17 | Ht 60.0 in | Wt 291.0 lb

## 2021-03-04 DIAGNOSIS — R0602 Shortness of breath: Secondary | ICD-10-CM | POA: Diagnosis not present

## 2021-03-04 DIAGNOSIS — F339 Major depressive disorder, recurrent, unspecified: Secondary | ICD-10-CM | POA: Diagnosis not present

## 2021-03-04 DIAGNOSIS — R079 Chest pain, unspecified: Secondary | ICD-10-CM | POA: Diagnosis present

## 2021-03-04 LAB — BASIC METABOLIC PANEL
BUN: 13 mg/dL (ref 6–23)
CO2: 28 mEq/L (ref 19–32)
Calcium: 9.3 mg/dL (ref 8.4–10.5)
Chloride: 103 mEq/L (ref 96–112)
Creatinine, Ser: 0.75 mg/dL (ref 0.40–1.20)
GFR: 76.91 mL/min (ref >60.00)
Glucose, Bld: 150 mg/dL — ABNORMAL HIGH (ref 70–99)
Potassium: 4.3 mEq/L (ref 3.5–5.1)
Sodium: 139 mEq/L (ref 135–145)

## 2021-03-04 LAB — TROPONIN I (HIGH SENSITIVITY): High Sens Troponin I: 6 ng/L (ref 2–17)

## 2021-03-04 LAB — BRAIN NATRIURETIC PEPTIDE: Pro B Natriuretic peptide (BNP): 50 pg/mL (ref 0.0–100.0)

## 2021-03-04 LAB — TSH: TSH: 4.34 u[IU]/mL (ref 0.35–4.50)

## 2021-03-04 MED ORDER — FLUOXETINE HCL 20 MG PO CAPS: 20.0000 mg | ORAL_CAPSULE | Freq: Every day | ORAL | 3 refills | Status: DC

## 2021-03-23 DIAGNOSIS — T7840XA Allergy, unspecified, initial encounter: Secondary | ICD-10-CM | POA: Insufficient documentation

## 2021-03-23 DIAGNOSIS — F32A Depression, unspecified: Secondary | ICD-10-CM | POA: Insufficient documentation

## 2021-03-23 DIAGNOSIS — F419 Anxiety disorder, unspecified: Secondary | ICD-10-CM | POA: Insufficient documentation

## 2021-03-23 DIAGNOSIS — R431 Parosmia: Secondary | ICD-10-CM | POA: Insufficient documentation

## 2021-03-23 DIAGNOSIS — I959 Hypotension, unspecified: Secondary | ICD-10-CM | POA: Insufficient documentation

## 2021-03-23 DIAGNOSIS — E785 Hyperlipidemia, unspecified: Secondary | ICD-10-CM | POA: Insufficient documentation

## 2021-03-26 ENCOUNTER — Ambulatory Visit (INDEPENDENT_AMBULATORY_CARE_PROVIDER_SITE_OTHER): Payer: Medicare Other

## 2021-03-26 ENCOUNTER — Encounter: Payer: Self-pay | Admitting: Cardiology

## 2021-03-26 ENCOUNTER — Ambulatory Visit (INDEPENDENT_AMBULATORY_CARE_PROVIDER_SITE_OTHER): Payer: Medicare Other | Admitting: Cardiology

## 2021-03-26 ENCOUNTER — Other Ambulatory Visit: Payer: Self-pay

## 2021-03-26 VITALS — BP 118/68 | HR 70 | Ht 60.0 in | Wt 286.0 lb

## 2021-03-26 DIAGNOSIS — R002 Palpitations: Secondary | ICD-10-CM

## 2021-03-26 DIAGNOSIS — G4733 Obstructive sleep apnea (adult) (pediatric): Secondary | ICD-10-CM | POA: Diagnosis not present

## 2021-03-26 DIAGNOSIS — R600 Localized edema: Secondary | ICD-10-CM | POA: Diagnosis not present

## 2021-03-26 DIAGNOSIS — Z9989 Dependence on other enabling machines and devices: Secondary | ICD-10-CM

## 2021-03-26 DIAGNOSIS — R0602 Shortness of breath: Secondary | ICD-10-CM | POA: Diagnosis not present

## 2021-03-26 MED ORDER — POTASSIUM CHLORIDE ER 10 MEQ PO TBCR: 10.0000 meq | EXTENDED_RELEASE_TABLET | ORAL | 2 refills | Status: DC

## 2021-03-26 MED ORDER — FUROSEMIDE 20 MG PO TABS: 20.0000 mg | ORAL_TABLET | ORAL | 2 refills | Status: DC

## 2021-03-26 MED ORDER — POTASSIUM CHLORIDE ER 10 MEQ PO TBCR: 10.0000 meq | EXTENDED_RELEASE_TABLET | Freq: Every day | ORAL | 3 refills | Status: DC

## 2021-03-26 NOTE — Patient Instructions (Signed)
Medication Instructions:  Your physician has recommended you make the following change in your medication: START: Lasix 20 mg on Tuesday, Thursday and Saturday START: Potassium 10 meq Tuesday, Thursday, and Saturday *If you need a refill on your cardiac medications before your next appointment, please call your pharmacy*   Lab Work: None If you have labs (blood work) drawn today and your tests are completely normal, you will receive your results only by: Marland Kitchen MyChart Message (if you have MyChart) OR . A paper copy in the mail If you have any lab test that is abnormal or we need to change your treatment, we will call you to review the results.   Testing/Procedures: Your physician has requested that you have an echocardiogram. Echocardiography is a painless test that uses sound waves to create images of your heart. It provides your doctor with information about the size and shape of your heart and how well your heart's chambers and valves are working. This procedure takes approximately one hour. There are no restrictions for this procedure.  A zio monitor was ordered today. It will remain on for 14 days. You will then return monitor and event diary in provided box. It takes 1-2 weeks for report to be downloaded and returned to [place]. We will call you with the results. If monitor falls off or has orange flashing light, please call Zio for further instructions.     Follow-Up: At [facility], you and your health needs are our priority.  As part of our continuing mission to provide you with exceptional heart care, we have created designated Provider Care Teams.  These Care Teams include your primary Cardiologist (physician) and Advanced Practice Providers (APPs -  Physician Assistants and Nurse Practitioners) who all work together to provide you with the care you need, when you need it.  We recommend signing up for the patient portal called "MyChart".  Sign up information is provided on this After  Visit Summary.  MyChart is used to connect with patients for Virtual Visits (Telemedicine).  Patients are able to view lab/test results, encounter notes, upcoming appointments, etc.  Non-urgent messages can be sent to your provider as well.   To learn more about what you can do with MyChart, go to [email or web address].    Your next appointment:   8 week(s)  The format for your next appointment:   In Person  Provider:   Thomasene Ripple, DO   Other Instructions  Echocardiogram An echocardiogram is a test that uses sound waves (ultrasound) to produce images of the heart. Images from an echocardiogram can provide important information about:  Heart size and shape.  The size and thickness and movement of your heart's walls.  Heart muscle function and strength.  Heart valve function or if you have stenosis. Stenosis is when the heart valves are too narrow.  If blood is flowing backward through the heart valves (regurgitation).  A tumor or infectious growth around the heart valves.  Areas of heart muscle that are not working well because of poor blood flow or injury from a heart attack.  Aneurysm detection. An aneurysm is a weak or damaged part of an artery wall. The wall bulges out from the normal force of blood pumping through the body. Tell a health care provider about:  Any allergies you have.  All medicines you are taking, including vitamins, herbs, eye drops, creams, and over-the-counter medicines.  Any blood disorders you have.  Any surgeries you have had.  Any medical conditions you have.  Whether you are pregnant or may be pregnant. What are the risks? Generally, this is a safe test. However, problems may occur, including an allergic reaction to dye (contrast) that may be used during the test. What happens before the test? No specific preparation is needed. You may eat and drink normally. What happens during the test?  You will take off your clothes from the  waist up and put on a hospital gown.  Electrodes or electrocardiogram (ECG)patches may be placed on your chest. The electrodes or patches are then connected to a device that monitors your heart rate and rhythm.  You will lie down on a table for an ultrasound exam. A gel will be applied to your chest to help sound waves pass through your skin.  A handheld device, called a transducer, will be pressed against your chest and moved over your heart. The transducer produces sound waves that travel to your heart and bounce back (or "echo" back) to the transducer. These sound waves will be captured in real-time and changed into images of your heart that can be viewed on a video monitor. The images will be recorded on a computer and reviewed by your health care provider.  You may be asked to change positions or hold your breath for a short time. This makes it easier to get different views or better views of your heart.  In some cases, you may receive contrast through an IV in one of your veins. This can improve the quality of the pictures from your heart. The procedure may vary among health care providers and hospitals.   What can I expect after the test? You may return to your normal, everyday life, including diet, activities, and medicines, unless your health care provider tells you not to do that. Follow these instructions at home:  It is up to you to get the results of your test. Ask your health care provider, or the department that is doing the test, when your results will be ready.  Keep all follow-up visits. This is important. Summary  An echocardiogram is a test that uses sound waves (ultrasound) to produce images of the heart.  Images from an echocardiogram can provide important information about the size and shape of your heart, heart muscle function, heart valve function, and other possible heart problems.  You do not need to do anything to prepare before this test. You may eat and drink  normally.  After the echocardiogram is completed, you may return to your normal, everyday life, unless your health care provider tells you not to do that. This information is not intended to replace advice given to you by your health care provider. Make sure you discuss any questions you have with your health care provider. Document Revised: 06/17/2020 Document Reviewed: 06/17/2020 Elsevier Patient Education  2021 Elsevier Inc.  ZIO  WHY IS MY DOCTOR PRESCRIBING ZIO? The Zio system is proven and trusted by physicians to detect and diagnose irregular heart rhythms -- and has been prescribed to hundreds of thousands of patients.  The FDA has cleared the Zio system to monitor for many different kinds of irregular heart rhythms. In a study, physicians were able to reach a diagnosis 90% of the time with the Zio system1.  You can wear the Zio monitor -- a small, discreet, comfortable patch -- during your normal day-to-day activity, including while you sleep, shower, and exercise, while it records every single heartbeat for analysis.  1Barrett, P., et al. Comparison of 24 Hour Holter  Monitoring Versus 14 Day Novel Adhesive Patch Electrocardiographic Monitoring. American Journal of Medicine, 2014.  ZIO VS. HOLTER MONITORING The Zio monitor can be comfortably worn for up to 14 days. Holter monitors can be worn for 24 to 48 hours, limiting the time to record any irregular heart rhythms you may have. Zio is able to capture data for the 51% of patients who have their first symptom-triggered arrhythmia after 48 hours.1  LIVE WITHOUT RESTRICTIONS The Zio ambulatory cardiac monitor is a small, unobtrusive, and water-resistant patch--you might even forget you're wearing it. The Zio monitor records and stores every beat of your heart, whether you're sleeping, working out, or showering. Remove on: June 2nd 2022

## 2021-03-26 NOTE — Progress Notes (Signed)
Cardiology Office Note:    Date:  03/26/2021   ID:  KERIN KREN, DOB 12/07/1945, MRN [id number]  PCP:  Pearline Cables, MD  Cardiologist:  None  Electrophysiologist:  None   Referring MD: Pearline Cables, MD   I have been really short of breath recently  History of Present Illness:    Nicole Mccann is a 75 y.o. female with a hx of morbid obesity, asthma, hyperlipidemia, diabetes mellitus, anxiety, OSA on CPAP is here today at the request of her primary care provider to be evaluated for shortness of breath.  The patient tells me that she has been experiencing significant worsening shortness of breath and exertion.  She does not she was able to climb the stairs at home without any problem but recently when she climbs stairs she feels significant shortness of breath in addition she has more palpitations.  She also says that she has migraine headaches which she thinks is stimulated by her shortness of breath as well as her palpitations.  She was started on Lasix by her PCP but the patient only take these as needed.  She has been taking her oxygen saturation at home and usually is in the 90s when she is up and down the stairs 96 she tells me.  No chest pain.  Patient is here today with her daughter.  Of note in 2018 she had similar problem she did see cardiology at that time echocardiogram was done which was reported to be normal.   Past Medical History:  Diagnosis Date  . Adjustment disorder with mixed anxiety and depressed mood 03/14/2015   Dr Madaline Guthrie actively treating [place]   . ALLERGIC RHINITIS 04/04/2009   Qualifier: Diagnosis of  By: Alwyn Ren MD, Chrissie Noa    . Allergy   . Anxiety    Dr Madaline Guthrie  . Asthma   . ASTHMA 04/04/2009   Qualifier: Diagnosis of  By: Alwyn Ren MD, Chrissie Noa   Status Asthmaticus 10/2004    . Atypical seizure (HCC) 06/07/2012  . BENIGN POSITIONAL VERTIGO 11/06/2009   Qualifier: Diagnosis of  By: Linford Arnold MD, Santina Evans    . Depression    Dr Madaline Guthrie   . Diabetes mellitus   . 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 Diverticulosis of colon 04/04/2009   Qualifier: Diagnosis of  By: Alwyn Ren MD, Chrissie Noa    . Dizziness and giddiness 12/22/2016  . DVT (deep venous thrombosis) (HCC)   . Headache(784.0)   . Hyperlipidemia   . Hypersomnia with sleep apnea, unspecified 05/22/2014  . Injury of left knee 08/22/2013   08[place]15/13 [place] contusion and abrasion of left knee. 08/17/13 seen in the emergency room; imaging negative. No labs. Referred to Dr. Pearletha Forge who recommended nonsteroidals and elevation. 10/18-11/3/14 in [place] for land and river cruise trip.  08/29/13 seen in ER in [place]; topical antibiotics & oral Ciprobay Rxed. 10/23 at second ER I&D declined ; wound  . Low back pain 05/26/2016  . Low blood pressure   . Mixed hyperlipidemia 07/10/2019  . 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 Olfactory aura   . OSA on CPAP   . Osteopenia 09/21/2016   dexa 09/2016  . Phlebitis following infusion 06/05/2012   Onset 06/01/12;S/P Dilantin infusion 05/30/12 @ [facility] for atypical seizure activity with response 06/05/12 DVT of Cephalic vein elbow- mid upper arm. Xarelto 15 mg initiated 06/05/12 by Dr Jeraldine Loots , [facility] and increased to one pill twice  a day. Despite this she had persistent thrombosis in the cephalic vein in the elbow and the upper forearm areas prompting change to Lovenox 110 mg e  . Pulmonary embolism (HCC) 07/09/2012   Presumed based on intermediate VQ 07/07/2012 and high clinical probability.  Definitive CT angiogram was not possible due to contrast  intolerance/allergy   . Right shoulder pain 04/04/2015  . Rotator cuff (capsule) sprain 03/13/2015  . Seizure disorder, temporal lobe, intractable (HCC) 10/17/2014  . Severe obesity (BMI >= 40) (HCC) 10/17/2014  . Shingles 07/2013  . Spinal stenosis of lumbar region 12/17/2012   S/P suboptimal response to Chiropractry ;Dr Kai Levins  referred her  to Dr Sherwood Gambler  [facility] [phone]; Valinda Hoar [phone] MRI 12/15/12 : stenosis @ L3-4; L 4-5  in context of DDD ESI recommended . 12/17/12 Note: I recommended discussion with [name],Neurologist; & Dr Margaretmary Bayley, Diabetologist   . Type 2 diabetes, controlled, with peripheral neuropathy (HCC) 04/04/2009   Qualifier: Diagnosis of  By: Alwyn Ren MD, Chrissie Noa   Dr Margaretmary Bayley, Endo Dr Emily Filbert , Ophth seen anually    . Variants of migraine, not elsewhere classified, without mention of intractable migraine without mention of status migrainosus 05/22/2013    Past Surgical History:  Procedure Laterality Date  . APPENDECTOMY    . ARTERY BIOPSY Left 04/14/2018   Procedure: BIOPSY TEMPORAL ARTERY;  Surgeon: Sherren Kerns, MD;  Location: [facility] OR;  Service: Vascular;  Laterality: Left;  . BREAST EXCISIONAL BIOPSY Right 1998  . CHOLECYSTECTOMY    . COLONOSCOPY  2006   [facility]  . rotator cuff surgery    . TONSILLECTOMY       Current Medications: Current Meds  Medication Sig  . albuterol (VENTOLIN HFA) 108 (90 Base) MCG/ACT inhaler Inhale 2 puffs into the lungs every 6 (six) hours as needed for wheezing or shortness of breath.  . Bacillus Coagulans-Inulin (PROBIOTIC) 1-250 BILLION-MG CAPS Take 1 tablet by mouth daily at 6 (six) AM.  . Cholecalciferol (VITAMIN D) 50 MCG (2000 UT) CAPS Take 1 capsule by mouth daily at 6 (six) AM.  . FLUoxetine (PROZAC) 20 MG capsule Take 1 capsule (20 mg total) by mouth daily.  Melene Muller ON 03/27/2021] furosemide (LASIX) 20 MG tablet Take 1 tablet (20 mg total) by mouth 3 (three) times a week. Take once on Tuesday, Thursday and Saturday.  . gabapentin (NEURONTIN) 100 MG capsule Take 1 capsule (100 mg total) by mouth 3 (three) times daily. (Patient taking differently: Take 100 mg by mouth at bedtime.)  . glipiZIDE (GLUCOTROL XL) 2.5 MG 24 hr tablet TAKE 1 TABLET BY MOUTH WITH BREAKFAST  . loratadine (CLARITIN) 10 MG tablet Take 10 mg by mouth  daily.  . potassium chloride (KLOR-CON) 10 MEQ tablet Take 1 tablet (10 mEq total) by mouth daily.  . rosuvastatin (CRESTOR) 10 MG tablet Take 1 tablet (10 mg total) by mouth daily.  . [DISCONTINUED] furosemide (LASIX) 20 MG tablet Take 1 tablet (20 mg total) by mouth daily. Use as needed for swelling of legs  . [DISCONTINUED] potassium chloride (KLOR-CON) 10 MEQ tablet Take 1 tablet (10 mEq total) by mouth 3 (three) times a week. Take once on Tuesday, Thursday and Saturday.     Allergies:   Mushroom ext cmplx(shiitake-reishi-mait), Shellfish allergy, Sulfonamide derivatives, Vancomycin, Fluticasone-salmeterol, Butalbital-aspirin-caffeine, Montelukast sodium, Penicillins, Acetaminophen, Clarithromycin, Iodinated diagnostic agents, Levofloxacin, and Orphenadrine   Social History   Socioeconomic History  . Marital status: Married    Spouse name: Nicole Mccann  .  Number of children: 2  . Years of education: College  . Highest education level: Not on file  Occupational History  . Not on file  Tobacco Use  . Smoking status: Never Smoker  . Smokeless tobacco: Never Used  Substance and Sexual Activity  . Alcohol use: Yes    Alcohol/week: 3.0 standard drinks    Types: 3 Glasses of wine per week    Comment: 1/2 glass wine daily  . Drug use: No  . Sexual activity: Never  Other Topics Concern  . Not on file  Social History Narrative   Patient is married Nicole Mccann) and lives at home with her husband.   Patient has two adult children.   Patient is retired.   Patient has a college education.   Patient is right-handed.   Patient drinks one cup of tea daily.   Social Determinants of Health   Financial Resource Strain: Low Risk   . Difficulty of Paying Living Expenses: Not hard at all  Food Insecurity: No Food Insecurity  . Worried About Programme researcher, broadcasting/film/video in the Last Year: Never true  . Ran Out of Food in the Last Year: Never true  Transportation Needs: No Transportation Needs  . Lack of  Transportation (Medical): No  . Lack of Transportation (Non-Medical): No  Physical Activity: Not on file  Stress: Not on file  Social Connections: Not on file     Family History: The patient's family history includes Breast cancer (age of onset: 24) in her sister; Cancer in her daughter; Coronary artery disease in her father; Diabetes in her maternal aunt and paternal aunt; Prostate cancer in her brother; Stomach cancer in her father. There is no history of Stroke.  ROS:   Review of Systems  Constitution: Negative for decreased appetite, fever and weight gain.  HENT: Negative for congestion, ear discharge, hoarse voice and sore throat.   Eyes: Negative for discharge, redness, vision loss in right eye and visual halos.  Cardiovascular: Negative for chest pain, dyspnea on exertion, leg swelling, orthopnea and palpitations.  Respiratory: Negative for cough, hemoptysis, shortness of breath and snoring.   Endocrine: Negative for heat intolerance and polyphagia.  Hematologic/Lymphatic: Negative for bleeding problem. Does not bruise/bleed easily.  Skin: Negative for flushing, nail changes, rash and suspicious lesions.  Musculoskeletal: Negative for arthritis, joint pain, muscle cramps, myalgias, neck pain and stiffness.  Gastrointestinal: Negative for abdominal pain, bowel incontinence, diarrhea and excessive appetite.  Genitourinary: Negative for decreased libido, genital sores and incomplete emptying.  Neurological: Negative for brief paralysis, focal weakness, headaches and loss of balance.  Psychiatric/Behavioral: Negative for altered mental status, depression and suicidal ideas.  Allergic/Immunologic: Negative for HIV exposure and persistent infections.    EKGs/Labs/Other Studies Reviewed:    The following studies were reviewed today:   EKG:  The ekg ordered today demonstrates   CXR 03/04/2021 FINDINGS: Mediastinum and hilar structures normal. Cardiomegaly. No pulmonary venous  congestion. Low lung volumes with mild basilar atelectasis. No pleural effusion or pneumothorax. No acute bony abnormality. Surgical clips upper abdomen.  IMPRESSION: 1.  Low lung volumes with mild basilar atelectasis.  2.  Cardiomegaly.  No pulmonary venous congestion.  Recent Labs: 11/26/2020: ALT 25; Hemoglobin 14.3; Platelets 207.0 03/04/2021: BUN 13; Creatinine, Ser 0.75; Potassium 4.3; Pro B Natriuretic peptide (BNP) 50.0; Sodium 139; TSH 4.34  Recent Lipid Panel    Component Value Date/Time   CHOL 161 09/05/2020 1434   TRIG 220.0 (H) 09/05/2020 1434   HDL 49.30 09/05/2020 1434  CHOLHDL 3 09/05/2020 1434   VLDL 44.0 (H) 09/05/2020 1434   LDLCALC 154 02/09/2017 0000   LDLDIRECT 92.0 09/05/2020 1434    Physical Exam:    VS:  BP 118/68 (BP Location: Right Arm)   Pulse 70   Ht 5' (1.524 m)   Wt 286 lb (129.7 kg)   SpO2 96%   BMI 55.86 kg/m     Wt Readings from Last 3 Encounters:  03/26/21 286 lb (129.7 kg)  03/04/21 291 lb (132 kg)  01/06/21 292 lb 6.4 oz (132.6 kg)     GEN: Well nourished, well developed in no acute distress HEENT: Normal NECK: No JVD; No carotid bruits LYMPHATICS: No lymphadenopathy CARDIAC: S1S2 noted,RRR, no murmurs, rubs, gallops RESPIRATORY:  Clear to auscultation without rales, wheezing or rhonchi  ABDOMEN: Soft, non-tender, non-distended, +bowel sounds, no guarding. EXTREMITIES: No edema, No cyanosis, no clubbing MUSCULOSKELETAL:  No deformity  SKIN: Warm and dry NEUROLOGIC:  Alert and oriented x 3, non-focal PSYCHIATRIC:  Normal affect, good insight  ASSESSMENT:    1. SOB (shortness of breath)   2. Palpitations   3. OSA on CPAP   4. Bilateral leg edema   5. Morbid obesity (HCC)    PLAN:     She does have worsening shortness of breath bilateral leg edema she is unable to lie flat at home.  I talked to the patient and her daughter I would recommend she start taking her Lasix regular basis and suggested Tuesday Thursdays and  Saturdays.  With potassium supplement.  I reviewed the lab work by her PCP there is no need to repeat any further lab work. It will be beneficial to get a repeat echocardiogram since this was 4 years ago at which time she did have normal ejection fraction with no valvular abnormalities and grade 1 diastolic dysfunction.  Her shortness of breath though could be multifactorial as her morbid obesity may be playing a role, her asthma and worsening sleep apnea.  With the echocardiogram will reassess LV function restarted pressures and any other valvular abnormalities.  Due to her palpitations, we are going to place a zio monitor on the patient with risk for any arrhythmias.  The patient is in agreement with the above plan. The patient left the office in stable condition.  The patient will follow up in   Medication Adjustments/Labs and Tests Ordered: Current medicines are reviewed at length with the patient today.  Concerns regarding medicines are outlined above.  Orders Placed This Encounter  Procedures  . LONG TERM MONITOR-LIVE TELEMETRY (3-14 DAYS)  . EKG 12-Lead  . ECHOCARDIOGRAM COMPLETE   Meds ordered this encounter  Medications  . furosemide (LASIX) 20 MG tablet    Sig: Take 1 tablet (20 mg total) by mouth 3 (three) times a week. Take once on Tuesday, Thursday and Saturday.    Dispense:  52 tablet    Refill:  2  . DISCONTD: potassium chloride (KLOR-CON) 10 MEQ tablet    Sig: Take 1 tablet (10 mEq total) by mouth 3 (three) times a week. Take once on Tuesday, Thursday and Saturday.    Dispense:  52 tablet    Refill:  2  . potassium chloride (KLOR-CON) 10 MEQ tablet    Sig: Take 1 tablet (10 mEq total) by mouth daily.    Dispense:  90 tablet    Refill:  3    Patient Instructions   Medication Instructions:  Your physician has recommended you make the following change in your  medication: START: Lasix 20 mg on Tuesday, Thursday and Saturday START: Potassium 10 meq Tuesday,  Thursday, and Saturday *If you need a refill on your cardiac medications before your next appointment, please call your pharmacy*   Lab Work: None If you have labs (blood work) drawn today and your tests are completely normal, you will receive your results only by: Marland Kitchen MyChart Message (if you have MyChart) OR . A paper copy in the mail If you have any lab test that is abnormal or we need to change your treatment, we will call you to review the results.   Testing/Procedures: Your physician has requested that you have an echocardiogram. Echocardiography is a painless test that uses sound waves to create images of your heart. It provides your doctor with information about the size and shape of your heart and how well your heart's chambers and valves are working. This procedure takes approximately one hour. There are no restrictions for this procedure.  A zio monitor was ordered today. It will remain on for 14 days. You will then return monitor and event diary in provided box. It takes 1-2 weeks for report to be downloaded and returned to [place]. We will call you with the results. If monitor falls off or has orange flashing light, please call Zio for further instructions.     Follow-Up: At [facility], you and your health needs are our priority.  As part of our continuing mission to provide you with exceptional heart care, we have created designated Provider Care Teams.  These Care Teams include your primary Cardiologist (physician) and Advanced Practice Providers (APPs -  Physician Assistants and Nurse Practitioners) who all work together to provide you with the care you need, when you need it.  We recommend signing up for the patient portal called "MyChart".  Sign up information is provided on this After Visit Summary.  MyChart is used to connect with patients for Virtual Visits (Telemedicine).  Patients are able to view lab/test results, encounter notes, upcoming appointments, etc.  Non-urgent  messages can be sent to your provider as well.   To learn more about what you can do with MyChart, go to [email or web address].    Your next appointment:   8 week(s)  The format for your next appointment:   In Person  Provider:   Thomasene Ripple, DO   Other Instructions  Echocardiogram An echocardiogram is a test that uses sound waves (ultrasound) to produce images of the heart. Images from an echocardiogram can provide important information about:  Heart size and shape.  The size and thickness and movement of your heart's walls.  Heart muscle function and strength.  Heart valve function or if you have stenosis. Stenosis is when the heart valves are too narrow.  If blood is flowing backward through the heart valves (regurgitation).  A tumor or infectious growth around the heart valves.  Areas of heart muscle that are not working well because of poor blood flow or injury from a heart attack.  Aneurysm detection. An aneurysm is a weak or damaged part of an artery wall. The wall bulges out from the normal force of blood pumping through the body. Tell a health care provider about:  Any allergies you have.  All medicines you are taking, including vitamins, herbs, eye drops, creams, and over-the-counter medicines.  Any blood disorders you have.  Any surgeries you have had.  Any medical conditions you have.  Whether you are pregnant or may be pregnant. What are the risks?  Generally, this is a safe test. However, problems may occur, including an allergic reaction to dye (contrast) that may be used during the test. What happens before the test? No specific preparation is needed. You may eat and drink normally. What happens during the test?  You will take off your clothes from the waist up and put on a hospital gown.  Electrodes or electrocardiogram (ECG)patches may be placed on your chest. The electrodes or patches are then connected to a device that monitors your heart  rate and rhythm.  You will lie down on a table for an ultrasound exam. A gel will be applied to your chest to help sound waves pass through your skin.  A handheld device, called a transducer, will be pressed against your chest and moved over your heart. The transducer produces sound waves that travel to your heart and bounce back (or "echo" back) to the transducer. These sound waves will be captured in real-time and changed into images of your heart that can be viewed on a video monitor. The images will be recorded on a computer and reviewed by your health care provider.  You may be asked to change positions or hold your breath for a short time. This makes it easier to get different views or better views of your heart.  In some cases, you may receive contrast through an IV in one of your veins. This can improve the quality of the pictures from your heart. The procedure may vary among health care providers and hospitals.   What can I expect after the test? You may return to your normal, everyday life, including diet, activities, and medicines, unless your health care provider tells you not to do that. Follow these instructions at home:  It is up to you to get the results of your test. Ask your health care provider, or the department that is doing the test, when your results will be ready.  Keep all follow-up visits. This is important. Summary  An echocardiogram is a test that uses sound waves (ultrasound) to produce images of the heart.  Images from an echocardiogram can provide important information about the size and shape of your heart, heart muscle function, heart valve function, and other possible heart problems.  You do not need to do anything to prepare before this test. You may eat and drink normally.  After the echocardiogram is completed, you may return to your normal, everyday life, unless your health care provider tells you not to do that. This information is not intended to  replace advice given to you by your health care provider. Make sure you discuss any questions you have with your health care provider. Document Revised: 06/17/2020 Document Reviewed: 06/17/2020 Elsevier Patient Education  2021 Elsevier Inc.  ZIO  WHY IS MY DOCTOR PRESCRIBING ZIO? The Zio system is proven and trusted by physicians to detect and diagnose irregular heart rhythms -- and has been prescribed to hundreds of thousands of patients.  The FDA has cleared the Zio system to monitor for many different kinds of irregular heart rhythms. In a study, physicians were able to reach a diagnosis 90% of the time with the Zio system1.  You can wear the Zio monitor -- a small, discreet, comfortable patch -- during your normal day-to-day activity, including while you sleep, shower, and exercise, while it records every single heartbeat for analysis.  1Barrett, P., et al. Comparison of 24 Hour Holter Monitoring Versus 14 Day Novel Adhesive Patch Electrocardiographic Monitoring. American Journal of  Medicine, 2014.  ZIO VS. HOLTER MONITORING The Zio monitor can be comfortably worn for up to 14 days. Holter monitors can be worn for 24 to 48 hours, limiting the time to record any irregular heart rhythms you may have. Zio is able to capture data for the 51% of patients who have their first symptom-triggered arrhythmia after 48 hours.1  LIVE WITHOUT RESTRICTIONS The Zio ambulatory cardiac monitor is a small, unobtrusive, and water-resistant patch--you might even forget you're wearing it. The Zio monitor records and stores every beat of your heart, whether you're sleeping, working out, or showering. Remove on: June 2nd 2022    Adopting a Healthy Lifestyle.  Know what a healthy weight is for you (roughly BMI <25) and aim to maintain this   Aim for 7+ servings of fruits and vegetables daily   65-80+ fluid ounces of water or unsweet tea for healthy kidneys   Limit to max 1 drink of alcohol per day; avoid  smoking/tobacco   Limit animal fats in diet for cholesterol and heart health - choose grass fed whenever available   Avoid highly processed foods, and foods high in saturated/trans fats   Aim for low stress - take time to unwind and care for your mental health   Aim for 150 min of moderate intensity exercise weekly for heart health, and weights twice weekly for bone health   Aim for 7-9 hours of sleep daily   When it comes to diets, agreement about the perfect plan isnt easy to find, even among the experts. Experts at the [facility] of [employer] developed an idea known as the Healthy Eating Plate. Just imagine a plate divided into logical, healthy portions.   The emphasis is on diet quality:   Load up on vegetables and fruits - one-half of your plate: Aim for color and variety, and remember that potatoes dont count.   Go for whole grains - one-quarter of your plate: Whole wheat, barley, wheat berries, quinoa, oats, brown rice, and foods made with them. If you want pasta, go with whole wheat pasta.   Protein power - one-quarter of your plate: Fish, chicken, beans, and nuts are all healthy, versatile protein sources. Limit red meat.   The diet, however, does go beyond the plate, offering a few other suggestions.   Use healthy plant oils, such as olive, canola, soy, corn, sunflower and peanut. Check the labels, and avoid partially hydrogenated oil, which have unhealthy trans fats.   If youre thirsty, drink water. Coffee and tea are good in moderation, but skip sugary drinks and limit milk and dairy products to one or two daily servings.   The type of carbohydrate in the diet is more important than the amount. Some sources of carbohydrates, such as vegetables, fruits, whole grains, and beans-are healthier than others.   Finally, stay active  Signed, Thomasene Ripple, DO  03/26/2021 4:17 PM    [facility]

## 2021-04-01 ENCOUNTER — Telehealth: Payer: Self-pay | Admitting: Emergency Medicine

## 2021-04-01 DIAGNOSIS — R002 Palpitations: Secondary | ICD-10-CM | POA: Diagnosis not present

## 2021-04-01 NOTE — Telephone Encounter (Signed)
Patient came in office with daughter to have zio monitor removed. I removed the monitor as patient requested. Patient tearful while removing. There was a blister with blood tinged fluid underneath upon removal. Patient reports it being painful. Applied gauze to the area. Had Dr. Bing Matter look at area. Patient advised to keep clean and use antibiotic ointment on the blister until healed and hydrocortisone ointment for irritaion in other areas. Advised patient she still needs to send monitor back in the mail.  Advised her if site begins to look worse to see pcp about further treatment. Patient understood. Patient unhappy with first mychart message response that states for patient to just send the monitor back. She feels this was not handled well and she is upset about this. Informed RN who sent that message, she plans to call patient. Looks like the message was a forwarded message from Dr. Servando Salina answering the original message. Regardless will inform Dr. Servando Salina.

## 2021-04-23 NOTE — Patient Instructions (Addendum)
It was good to see you again today, please consider getting the shingles vaccine at your pharmacy if not done already  I refilled your albuterol inhaler to use as needed You can use the maxalt as needed for headaches  Please see me for a physical in a few months at your convenience  Take care!

## 2021-04-23 NOTE — Progress Notes (Signed)
[facility] [address], [address] [place], [place] [address] [phone]  Date:  04/27/2021   Name:  Nicole Mccann   DOB:  [birth year]   MRN:  [id number]  PCP:  Pearline Cables, MD    Chief Complaint: Medical Management of Chronic Issues (4-6 m f/u /Additional inhaler refill)   History of Present Illness:  Nicole Mccann is a 75 y.o. very pleasant female patient who presents with the following:  Patient is seen today for periodic follow-up visit Most recent visit with myself in April Here today with her daughter Nicole Mccann who contributes to the history   History of controlled diabetes with polyneuropathy, obesity, hyperlipidemia, seizure disorder, sleep apnea on CPAP, venous insufficiency with swelling of bilateral lower extremities She also sees endocrinology for diabetes care, Dr. Elvera Lennox  Her last visit Nicole Mccann  had concerns about shortness of breath.  Chest x-ray and BNP were normal-I referred her to cardiology for evaluation She was seen by Dr. Servando Salina last month-she recommended that patient take Lasix daily to help with leg edema, and a Zio patch which showed some short runs of SVT.  These have been asymptomatic so they did not start a beta-blocker  We also had her start on fluoxetine 20 mg for symptoms of depression- this does seem to be helping and is happy with 20 mg-at this time she does not wish to increase to 40 mg She is supposed to use lasix 3x a week but does not always do this   She has been using albuterol and doing fine- no allergic sx  They need a refill of her inhaler today, she has been borrowing some of this medication from her daughter  A1c checked in December- looked good   In addition, they have noted more headaches recently.  Her daughter thought the sound like the migraines that she herself gets, so she has given her mom some of her Maxalt tablets which have helped her.  They wonder if she can get a  prescription of her own Sometimes nausea, no vomiting with headache No vision change, but she says will smell a strange odor prior to onset of headache  As usual, she is wearing a very attractive and unique outfit.  It turns out that she actually so as a lot of her own clothing and has done so for years    Patient Active Problem List   Diagnosis Date Noted   Olfactory aura    Low blood pressure    Hyperlipidemia    Depression    Anxiety    Allergy    Bilateral headaches 12/09/2020   Mixed hyperlipidemia 07/10/2019   Dizziness and giddiness 12/22/2016   Osteopenia 09/21/2016   Low back pain 05/26/2016   Right shoulder pain 04/04/2015   Adjustment disorder with mixed anxiety and depressed mood 03/14/2015   Rotator cuff (capsule) sprain 03/13/2015   Pseudoptosis 11/18/2014   Seizure disorder, temporal lobe, intractable (HCC) 10/17/2014   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/2014   Severe obesity (BMI >= 40) (HCC) 10/17/2014   OSA on CPAP 10/17/2014   Hypersomnia with sleep apnea, unspecified 05/22/2014   Injury of left knee 08/22/2013   Shingles 07/2013   Variants of migraine, not elsewhere classified, without mention of intractable migraine without mention of status migrainosus 05/22/2013   Disturbances of sensation of smell and taste 05/22/2013   Spinal stenosis of lumbar region 12/17/2012   Pulmonary embolism (HCC) 07/09/2012  DVT (deep venous thrombosis) (HCC) 07/07/2012   Atypical seizure (HCC) 06/07/2012   Phlebitis following infusion 06/05/2012   BENIGN POSITIONAL VERTIGO 11/06/2009   Type 2 diabetes, controlled, with peripheral neuropathy (HCC) 04/04/2009   ALLERGIC RHINITIS 04/04/2009   ASTHMA 04/04/2009   Diverticulosis of colon 04/04/2009    Past Medical History:  Diagnosis Date   Adjustment disorder with mixed anxiety and depressed mood 03/14/2015   Dr Madaline Guthrie actively treating [place]    ALLERGIC RHINITIS 04/04/2009   Qualifier: Diagnosis of  By: Alwyn Ren MD,  Chrissie Noa     Allergy    Anxiety    Dr Madaline Guthrie   Asthma    ASTHMA 04/04/2009   Qualifier: Diagnosis of  By: Alwyn Ren MD, [name]   Status Asthmaticus 10/2004     Atypical seizure (HCC) 06/07/2012   BENIGN POSITIONAL VERTIGO 11/06/2009   Qualifier: Diagnosis of  By: Linford Arnold MD, Catherine     Depression    Dr Madaline Guthrie   Diabetes mellitus    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 08/17/13 seen in the emergency room; imaging negative. No labs. Referred to Dr. Pearletha Forge who recommended nonsteroidals and elevation. 10/18-11/3/14 in [place] for land and river cruise trip.  08/29/13 seen in ER in [place]; topical antibiotics & oral Ciprobay Rxed. 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 Xarelto 15 mg initiated 06/05/12 by Dr Jeraldine Loots , [facility] and increased to one pill twice a day. Despite this she had persistent thrombosis in the cephalic vein in the elbow and the upper forearm areas prompting change to Lovenox 110 mg e   Pulmonary embolism (HCC) 07/09/2012   Presumed based on intermediate VQ 07/07/2012 and high clinical probability.  Definitive CT angiogram was not possible due to contrast  intolerance/allergy     Right shoulder pain 04/04/2015   Rotator cuff (capsule) sprain 03/13/2015   Seizure disorder, temporal lobe, intractable (HCC) 10/17/2014   Severe obesity (BMI >= 40) (HCC) 10/17/2014   Shingles 07/2013   Spinal stenosis of lumbar region 12/17/2012   S/P suboptimal response to Chiropractry ;Dr Kai Levins referred her  to Dr Sherwood [facility] [phone]; FAX [phone] MRI 12/15/12 : stenosis @ L3-4; L 4-5  in context of DDD ESI recommended . 12/17/12 Note: I recommended discussion with [name],Neurologist; & Dr Margaretmary Bayley, Diabetologist    Type 2 diabetes, controlled, with peripheral neuropathy (HCC) 04/04/2009   Qualifier: Diagnosis of  By: Alwyn Ren MD, Chrissie Noa   Dr Margaretmary Bayley, Endo Dr Emily Filbert , Ophth seen anually     Variants of migraine, not elsewhere classified, without mention of intractable migraine without mention of status migrainosus  05/22/2013    Past Surgical History:  Procedure Laterality Date   APPENDECTOMY     ARTERY BIOPSY Left 04/14/2018   Procedure: BIOPSY TEMPORAL ARTERY;  Surgeon: Sherren Kerns, MD;  Location: [facility] OR;  Service: Vascular;  Laterality: Left;   BREAST EXCISIONAL BIOPSY Right 1998   CHOLECYSTECTOMY     COLONOSCOPY  2006   [facility]   rotator cuff surgery     TONSILLECTOMY       Social History   Tobacco Use   Smoking status: Never   Smokeless tobacco: Never  Substance Use Topics   Alcohol use: Yes    Alcohol/week: 3.0 standard drinks    Types: 3 Glasses of wine per week    Comment: 1/2 glass wine daily   Drug use: No    Family History  Problem Relation Age of Onset   Stomach cancer Father    Coronary artery disease Father    Breast cancer Sister 74       breast cancer   Diabetes Maternal Aunt    Diabetes Paternal Aunt    Prostate cancer Brother    Cancer Daughter    Stroke Neg Hx     Allergies  Allergen Reactions   Mushroom Ext Cmplx(Shiitake-Reishi-Mait) Anaphylaxis   Shellfish Allergy Anaphylaxis   Sulfonamide  Derivatives Anaphylaxis   Vancomycin Anaphylaxis   Fluticasone-Salmeterol Other (See Comments)    Feels like something in the throat   Butalbital-Aspirin-Caffeine Other (See Comments)    UNSPECIFIED REACTION    Montelukast Sodium Other (See Comments)    Feels like she's running out of breath   Penicillins     UNSPECIFIED REACTION      Childhood allergy Has patient had a PCN reaction causing immediate rash, facial/tongue/throat swelling, SOB or lightheadedness with hypotension: Unknown Has patient had a PCN reaction causing severe rash involving mucus membranes or skin necrosis: Unknown Has patient had a PCN reaction that required hospitalization: No Has patient had a PCN reaction occurring within the last 10 years: No If all of the above answers are "NO", then may proceed with Cephalosporin use.     Acetaminophen Other (See Comments)    Stomach discomfort   Clarithromycin Nausea And Vomiting   Iodinated Diagnostic Agents Other (See Comments)    Bad headache; no prep needed   Levofloxacin Diarrhea and Nausea And Vomiting    Dizziness Believes it was due to a high dosage of this medication   Orphenadrine Nausea And Vomiting    Medication list has been reviewed and updated.  Current Outpatient Medications on File Prior to Visit  Medication Sig Dispense Refill   Bacillus Coagulans-Inulin (PROBIOTIC) 1-250 BILLION-MG CAPS Take 1 tablet by mouth daily at 6 (six) AM.     Cholecalciferol (VITAMIN D) 50 MCG (2000 UT) CAPS Take 1 capsule by mouth daily at 6 (six) AM.     FLUoxetine (PROZAC) 20 MG capsule Take 1 capsule (20 mg total) by mouth daily. 90 capsule 3   furosemide (LASIX) 20 MG tablet Take 1 tablet (20 mg total) by mouth 3 (three) times a week. Take once on Tuesday, Thursday and Saturday. 52 tablet 2   gabapentin (NEURONTIN) 100 MG capsule Take 1 capsule (100 mg total) by mouth 3 (three) times daily. (Patient taking differently: Take 100 mg by mouth at bedtime.) 270 capsule 1    glipiZIDE (GLUCOTROL XL) 2.5 MG 24 hr tablet TAKE 1 TABLET BY MOUTH WITH BREAKFAST 90 tablet 2   loratadine (CLARITIN) 10 MG tablet  Take 10 mg by mouth daily.     potassium chloride (KLOR-CON) 10 MEQ tablet Take 1 tablet (10 mEq total) by mouth daily. 90 tablet 3   rosuvastatin (CRESTOR) 10 MG tablet Take 1 tablet (10 mg total) by mouth daily. 90 tablet 3   No current facility-administered medications on file prior to visit.    Review of Systems:  As per HPI- otherwise negative.   Physical Examination: Vitals:   04/27/21 1608  BP: 140/74  Pulse: 73  Temp: 98.3 F (36.8 C)  SpO2: 98%   Vitals:   04/27/21 1608  Height: 5' (1.524 m)   Body mass index is 55.86 kg/m. Ideal Body Weight: Weight in (lb) to have BMI = 25: 127.7  GEN: no acute distress.  Obese, seated in wheelchair as per her normal HEENT: Atraumatic, Normocephalic.  PEERL Ears and Nose: No external deformity. CV: RRR, No M/G/R. No JVD. No thrill. No extra heart sounds. PULM: CTA B, no wheezes, crackles, rhonchi. No retractions. No resp. distress. No accessory muscle use. ABD: S, NT, ND, +BS. No rebound. No HSM. EXTR: No c/c/e PSYCH: Normally interactive. Conversant.    Assessment and Plan: Other migraine with status migrainosus, not intractable - Plan: rizatriptan (MAXALT) 10 MG tablet  Mild intermittent asthma, unspecified whether complicated - Plan: albuterol (VENTOLIN HFA) 108 (90 Base) MCG/ACT inhaler  Immunization due  SOB (shortness of breath)  Patient is here today for a follow-up visit.  She is using albuterol periodically for shortness of breath with good results.  I have refilled her albuterol to use as needed  She has recently noted more frequent headaches, they seem to respond to a triptan that she was using from her daughter's prescription.  I prescribed Maxalt for her to use as needed and asked them to keep me posted about her progress  Recommended Shingrix vaccine if not done already  Plan  to see me in 5 or 6 months for physical in the fall  This visit occurred during the SARS-CoV-2 public health emergency.  Safety protocols were in place, including screening questions prior to the visit, additional usage of staff PPE, and extensive cleaning of exam room while observing appropriate contact time as indicated for disinfecting solutions.    Signed Abbe Amsterdam, MD

## 2021-04-24 ENCOUNTER — Other Ambulatory Visit: Payer: Self-pay

## 2021-04-24 ENCOUNTER — Ambulatory Visit (HOSPITAL_COMMUNITY): Payer: Medicare Other | Attending: Cardiology

## 2021-04-24 DIAGNOSIS — R0602 Shortness of breath: Secondary | ICD-10-CM | POA: Insufficient documentation

## 2021-04-24 LAB — ECHOCARDIOGRAM COMPLETE
Area-P 1/2: 3.17 cm2
S' Lateral: 2.6 cm

## 2021-04-27 ENCOUNTER — Other Ambulatory Visit: Payer: Self-pay

## 2021-04-27 ENCOUNTER — Ambulatory Visit: Payer: Medicare Other | Admitting: Family Medicine

## 2021-04-27 VITALS — BP 140/74 | HR 73 | Temp 98.3°F | Ht 60.0 in

## 2021-04-27 DIAGNOSIS — R0602 Shortness of breath: Secondary | ICD-10-CM | POA: Diagnosis not present

## 2021-04-27 DIAGNOSIS — Z23 Encounter for immunization: Secondary | ICD-10-CM

## 2021-04-27 DIAGNOSIS — J4520 Mild intermittent asthma, uncomplicated: Secondary | ICD-10-CM

## 2021-04-27 DIAGNOSIS — G43801 Other migraine, not intractable, with status migrainosus: Secondary | ICD-10-CM

## 2021-04-27 MED ORDER — RIZATRIPTAN BENZOATE 10 MG PO TABS: 10.0000 mg | ORAL_TABLET | ORAL | 3 refills | Status: DC | PRN

## 2021-04-27 MED ORDER — ALBUTEROL SULFATE HFA 108 (90 BASE) MCG/ACT IN AERS: 2.0000 | INHALATION_SPRAY | Freq: Four times a day (QID) | RESPIRATORY_TRACT | 6 refills | Status: DC | PRN

## 2021-05-12 ENCOUNTER — Encounter: Payer: Self-pay | Admitting: Cardiology

## 2021-05-12 ENCOUNTER — Other Ambulatory Visit: Payer: Self-pay

## 2021-05-12 ENCOUNTER — Ambulatory Visit (INDEPENDENT_AMBULATORY_CARE_PROVIDER_SITE_OTHER): Payer: Medicare Other | Admitting: Cardiology

## 2021-05-12 VITALS — BP 118/74 | HR 76 | Ht 60.0 in | Wt 290.0 lb

## 2021-05-12 DIAGNOSIS — I5189 Other ill-defined heart diseases: Secondary | ICD-10-CM | POA: Diagnosis not present

## 2021-05-12 DIAGNOSIS — E781 Pure hyperglyceridemia: Secondary | ICD-10-CM

## 2021-05-12 DIAGNOSIS — I5032 Chronic diastolic (congestive) heart failure: Secondary | ICD-10-CM | POA: Diagnosis not present

## 2021-05-12 DIAGNOSIS — R0602 Shortness of breath: Secondary | ICD-10-CM | POA: Diagnosis not present

## 2021-05-12 DIAGNOSIS — E0800 Diabetes mellitus due to underlying condition with hyperosmolarity without nonketotic hyperglycemic-hyperosmolar coma (NKHHC): Secondary | ICD-10-CM

## 2021-05-12 DIAGNOSIS — I471 Supraventricular tachycardia: Secondary | ICD-10-CM

## 2021-05-12 NOTE — Progress Notes (Signed)
Cardiology Office Note:    Date:  05/12/2021   ID:  Nicole Mccann, DOB Mar 24, 1946, MRN [id number]  PCP:  Pearline Cables, MD  Cardiologist:  None  Electrophysiologist:  None   Referring MD: Pearline Cables, MD   Has not had palpitations recently.   History of Present Illness:    Nicole Mccann is a 75 y.o. female with a hx of morbid obesity, asthma, hyperlipidemia, diabetes mellitus, anxiety, OSA on CPAP is here today for follow-up visit.    I first saw the patient on Mar 26, 2021 at that time she presented with her daughter at which time she complained of worsening shortness of breath on exertion as well as palpitations.  During that visit I placed a monitor on the patient but unfortunately she was unable to wear the monitor for a long time due to blisters forming on her skin.  We were able to review the monitor which showed episodes very rare episodes of paroxysmal SVT.  I suggested use of propanolol but at the time the patient declined. During her initial visit I recommended that she take her Lasix 3 times weekly as she did have significant leg edema.  Today she tells me that she has not had any significant palpitations.  Past Medical History:  Diagnosis Date   Adjustment disorder with mixed anxiety and depressed mood 03/14/2015   Dr Madaline Guthrie actively treating [place]    ALLERGIC RHINITIS 04/04/2009   Qualifier: Diagnosis of  By: Alwyn Ren MD, Chrissie Noa     Allergy    Anxiety    Dr Madaline Guthrie   Asthma    ASTHMA 04/04/2009   Qualifier: Diagnosis of  By: Alwyn Ren MD, [name]   Status Asthmaticus 10/2004     Atypical seizure (HCC) 06/07/2012   BENIGN POSITIONAL VERTIGO 11/06/2009   Qualifier: Diagnosis of  By: Linford Arnold MD, Catherine     Depression    Dr Madaline Guthrie   Diabetes mellitus    Disturbances of sensation of smell and taste 05/22/2013   Diverticulosis of colon 04/04/2009   Qualifier: Diagnosis of  By: Alwyn Ren MD, [name]     Dizziness and giddiness 12/22/2016   DVT (deep  venous thrombosis) (HCC)    Headache(784.0)    Hyperlipidemia    Hypersomnia with sleep apnea, unspecified 05/22/2014   Injury of left knee 08/22/2013   08[place]15/13 mechanical [place] sustaining contusion and abrasion of left knee. 08/17/13 seen in the emergency room; imaging negative. No labs. Referred to Dr. Pearletha Forge who recommended nonsteroidals and elevation. 10/18-11/3/14 in [place] for land and river cruise trip.  08/29/13 seen in ER in [place]; topical antibiotics & oral Ciprobay Rxed. 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 Xarelto 15 mg initiated 06/05/12 by Dr Jeraldine Loots , [facility] and increased to one pill twice a day. Despite this she had persistent thrombosis in the cephalic vein in the elbow and the upper forearm areas prompting change to Lovenox 110 mg e   Pulmonary embolism (HCC) 07/09/2012   Presumed based on intermediate VQ 07/07/2012  and high clinical probability.  Definitive CT angiogram was not possible due to contrast  intolerance/allergy    Right shoulder pain 04/04/2015   Rotator cuff (capsule) sprain 03/13/2015   Seizure disorder, temporal lobe, intractable (HCC) 10/17/2014   Severe obesity (BMI >= 40) (HCC) 10/17/2014   Shingles 07/2013   Spinal stenosis of lumbar region 12/17/2012   S/P suboptimal response to Chiropractry ;Dr Kai LevinsMichel referred her  to Dr Sherwood GamblerGregory Crisp,[facility] [phone]; FAX [phone] MRI 12/15/12 : stenosis @ L3-4; L 4-5  in context of DDD ESI recommended . 12/17/12 Note: I recommended discussion with [name],Neurologist; & Dr Margaretmary BayleyPreston Clark,  Diabetologist    Type 2 diabetes, controlled, with peripheral neuropathy (HCC) 04/04/2009   Qualifier: Diagnosis of  By: Alwyn RenHopper MD, Chrissie NoaWilliam   Dr Margaretmary BayleyPreston Clark, Endo Dr Emily FilbertGould , Ophth seen anually     Variants of migraine, not elsewhere classified, without mention of intractable migraine without mention of status migrainosus 05/22/2013    Past Surgical History:  Procedure Laterality Date   APPENDECTOMY     ARTERY BIOPSY Left 04/14/2018   Procedure: BIOPSY TEMPORAL ARTERY;  Surgeon: Sherren KernsFields, Charles E, MD;  [facility];  Service: Vascular;  Laterality: Left;   BREAST EXCISIONAL BIOPSY Right 1998   CHOLECYSTECTOMY     COLONOSCOPY  2006   [facility]   rotator cuff surgery     TONSILLECTOMY       Current Medications: Current Meds  Medication Sig   albuterol (VENTOLIN HFA) 108 (90 Base) MCG/ACT inhaler Inhale 2 puffs into the lungs every 6 (six) hours as needed for wheezing or shortness of breath.   Bacillus Coagulans-Inulin (PROBIOTIC) 1-250 BILLION-MG CAPS Take 1 tablet by mouth daily at 6 (six) AM.   Cholecalciferol (VITAMIN D) 50 MCG (2000 UT) CAPS Take 1 capsule by mouth daily at 6 (six) AM.   FLUoxetine (PROZAC) 20 MG capsule Take 1 capsule (20 mg total) by mouth daily.   furosemide (LASIX) 20 MG tablet Take 1 tablet (20 mg total) by mouth 3 (three) times a week. Take once on Tuesday, Thursday and Saturday.   gabapentin (NEURONTIN) 100 MG capsule Take 1 capsule (100 mg total) by mouth 3 (three) times daily. (Patient taking differently: Take 100 mg by mouth at bedtime.)   glipiZIDE (GLUCOTROL XL) 2.5 MG 24 hr tablet TAKE 1 TABLET BY MOUTH WITH BREAKFAST   loratadine (CLARITIN) 10 MG tablet Take 10 mg by mouth daily.   potassium chloride (KLOR-CON) 10 MEQ tablet Take 10 mEq by mouth 3 (three) times a week. Take on days you use Lasix   rizatriptan (MAXALT) 10 MG tablet Take 1 tablet (10 mg total) by mouth as needed for migraine. May repeat in 2 hours if needed   rosuvastatin (CRESTOR) 10  MG tablet Take 1 tablet (10 mg total) by mouth daily.   [DISCONTINUED] potassium chloride (KLOR-CON) 10 MEQ tablet Take 1 tablet (10 mEq total) by mouth daily.     Allergies:   Mushroom ext cmplx(shiitake-reishi-mait), Shellfish allergy, Sulfonamide derivatives, Vancomycin, Fluticasone-salmeterol, Butalbital-aspirin-caffeine, Montelukast sodium, Penicillins, Acetaminophen, Clarithromycin, Iodinated diagnostic agents, Levofloxacin, and Orphenadrine   Social History   Socioeconomic History   Marital status: Married    Spouse name: Lars MageJuan   Number of children: 2   Years of education: College   Highest education level: Not on file  Occupational History   Not on file  Tobacco Use   Smoking status: Never   Smokeless tobacco: Never  Substance and Sexual Activity  Alcohol use: Yes    Alcohol/week: 3.0 standard drinks    Types: 3 Glasses of wine per week    Comment: 1/2 glass wine daily   Drug use: No   Sexual activity: Never  Other Topics Concern   Not on file  Social History Narrative   Patient is married Lars Mage) and lives at home with her husband.   Patient has two adult children.   Patient is retired.   Patient has a college education.   Patient is right-handed.   Patient drinks one cup of tea daily.   Social Determinants of Health   Financial Resource Strain: Low Risk    Difficulty of Paying Living Expenses: Not hard at all  Food Insecurity: No Food Insecurity   Worried About Programme researcher, broadcasting/film/video in the Last Year: Never true   Ran Out of Food in the Last Year: Never true  Transportation Needs: No Transportation Needs   Lack of Transportation (Medical): No   Lack of Transportation (Non-Medical): No  Physical Activity: Not on file  Stress: Not on file  Social Connections: Not on file     Family History: The patient's family history includes Breast cancer (age of onset: 50) in her sister; Cancer in her daughter; Coronary artery disease in her father; Diabetes in her maternal  aunt and paternal aunt; Prostate cancer in her brother; Stomach cancer in her father. There is no history of Stroke.  ROS:   Review of Systems  Constitution: Negative for decreased appetite, fever and weight gain.  HENT: Negative for congestion, ear discharge, hoarse voice and sore throat.   Eyes: Negative for discharge, redness, vision loss in right eye and visual halos.  Cardiovascular: Negative for chest pain, dyspnea on exertion, leg swelling, orthopnea and palpitations.  Respiratory: Negative for cough, hemoptysis, shortness of breath and snoring.   Endocrine: Negative for heat intolerance and polyphagia.  Hematologic/Lymphatic: Negative for bleeding problem. Does not bruise/bleed easily.  Skin: Negative for flushing, nail changes, rash and suspicious lesions.  Musculoskeletal: Negative for arthritis, joint pain, muscle cramps, myalgias, neck pain and stiffness.  Gastrointestinal: Negative for abdominal pain, bowel incontinence, diarrhea and excessive appetite.  Genitourinary: Negative for decreased libido, genital sores and incomplete emptying.  Neurological: Negative for brief paralysis, focal weakness, headaches and loss of balance.  Psychiatric/Behavioral: Negative for altered mental status, depression and suicidal ideas.  Allergic/Immunologic: Negative for HIV exposure and persistent infections.    EKGs/Labs/Other Studies Reviewed:    The following studies were reviewed today:   EKG: None today  echocardiogram April 24, 2021 IMPRESSIONS   1. Left ventricular ejection fraction, by estimation, is 70 to 75%. The  left ventricle has hyperdynamic function. The left ventricle has no  regional wall motion abnormalities. Left ventricular diastolic parameters  are consistent with Grade I diastolic  dysfunction (impaired relaxation). Elevated left atrial pressure.   2. Right ventricular systolic function is normal. The right ventricular  size is normal.   3. The mitral valve is  normal in structure. Trivial mitral valve  regurgitation. No evidence of mitral stenosis.   4. The aortic valve has an indeterminant number of cusps. Aortic valve  regurgitation is not visualized. No aortic stenosis is present.   FINDINGS   Left Ventricle: Left ventricular ejection fraction, by estimation, is 70  to 75%. The left ventricle has hyperdynamic function. The left ventricle  has no regional wall motion abnormalities. The left ventricular internal  cavity size was normal in size.  There is no  left ventricular hypertrophy. Left ventricular diastolic  parameters are consistent with Grade I diastolic dysfunction (impaired  relaxation). Elevated left atrial pressure.   Right Ventricle: The right ventricular size is normal. Right ventricular  systolic function is normal.   Left Atrium: Left atrial size was normal in size.   Right Atrium: Right atrial size was normal in size.   Pericardium: There is no evidence of pericardial effusion.   Mitral Valve: The mitral valve is normal in structure. Trivial mitral  valve regurgitation. No evidence of mitral valve stenosis.   Tricuspid Valve: The tricuspid valve is normal in structure. Tricuspid  valve regurgitation is trivial. No evidence of tricuspid stenosis.   Aortic Valve: The aortic valve has an indeterminant number of cusps.  Aortic valve regurgitation is not visualized. No aortic stenosis is  present.   Pulmonic Valve: The pulmonic valve was normal in structure. Pulmonic valve  regurgitation is not visualized. No evidence of pulmonic stenosis.   Aorta: The aortic root is normal in size and structure.   Venous: The inferior vena cava was not well visualized.   ZIO monitor Patch Wear Time:  5 days and 23 hours starting Mar 26, 2021. Indications: Palpitations   Patient had a min HR of 53 bpm, max HR of 152 bpm, and avg HR of 69 bpm.   Predominant underlying rhythm was Sinus Rhythm.   2 Supraventricular Tachycardia runs  occurred, the run with the fastest interval lasting 5 beats with a max rate of 152 bpm (avg 127 bpm); the run with the fastest interval was also the longest.   Premature atrial complexes were rare. Premature ventricular complexes were rare.   Symptoms associated with sinus rhythm.   No ventricular tachycardia, no pauses, no atrial fibrillation noted.   Conclusion: Rare asymptomatic paroxysmal supraventricular tachycardia.  Recent Labs: 11/26/2020: ALT 25; Hemoglobin 14.3; Platelets 207.0 03/04/2021: BUN 13; Creatinine, Ser 0.75; Potassium 4.3; Pro B Natriuretic peptide (BNP) 50.0; Sodium 139; TSH 4.34  Recent Lipid Panel    Component Value Date/Time   CHOL 161 09/05/2020 1434   TRIG 220.0 (H) 09/05/2020 1434   HDL 49.30 09/05/2020 1434   CHOLHDL 3 09/05/2020 1434   VLDL 44.0 (H) 09/05/2020 1434   LDLCALC 154 02/09/2017 0000   LDLDIRECT 92.0 09/05/2020 1434    Physical Exam:    VS:  BP 118/74   Pulse 76   Ht 5' (1.524 m)   Wt 290 lb (131.5 kg)   SpO2 96%   BMI 56.64 kg/m     Wt Readings from Last 3 Encounters:  05/12/21 290 lb (131.5 kg)  03/26/21 286 lb (129.7 kg)  03/04/21 291 lb (132 kg)     GEN: Well nourished, well developed in no acute distress HEENT: Normal NECK: No JVD; No carotid bruits LYMPHATICS: No lymphadenopathy CARDIAC: S1S2 noted,RRR, no murmurs, rubs, gallops RESPIRATORY:  Clear to auscultation without rales, wheezing or rhonchi  ABDOMEN: Soft, non-tender, non-distended, +bowel sounds, no guarding. EXTREMITIES: No edema, No cyanosis, no clubbing MUSCULOSKELETAL:  No deformity  SKIN: Warm and dry NEUROLOGIC:  Alert and oriented x 3, non-focal PSYCHIATRIC:  Normal affect, good insight  ASSESSMENT:    1. SOB (shortness of breath)   2. Diastolic dysfunction   3. Chronic diastolic heart failure (HCC)   4. Morbid obesity (HCC)   5. PSVT (paroxysmal supraventricular tachycardia) (HCC)   6. Hypertriglyceridemia   7. Diabetes mellitus due to  underlying condition with hyperosmolarity without coma, without long-term current use of insulin (HCC)  PLAN:    She still has shortness of breath and I really do think this is multifactorial in this morbidly obese patient who is physically deconditioned.  I explained to the patient and her daughter that her underlying asthma is well as her morbid obesity, however limit activity could really be playing a role here.  She also may need to have her CPAP reassessed.  I will refer the patient to pulmonary.  In the meantime she has responded well to her diuretics.  She does have diastolic dysfunction.  Palpitations has resolved for now we will continue to monitor.  Diabetes-this is being managed by his primary care doctor.  No adjustments for antidiabetic medications were made today.   The patient is in agreement with the above plan. The patient left the office in stable condition.  The patient will follow up in 6 months or sooner if needed.    Medication Adjustments/Labs and Tests Ordered: Current medicines are reviewed at length with the patient today.  Concerns regarding medicines are outlined above.  Orders Placed This Encounter  Procedures   Ambulatory referral to Pulmonology   No orders of the defined types were placed in this encounter.   Patient Instructions  Medication Instructions:  Your physician recommends that you continue on your current medications as directed. Please refer to the Current Medication list given to you today.  *If you need a refill on your cardiac medications before your next appointment, please call your pharmacy*   Lab Work: None ordered   If you have labs (blood work) drawn today and your tests are completely normal, you will receive your results only by: MyChart Message (if you have MyChart) OR A paper copy in the mail If you have any lab test that is abnormal or we need to change your treatment, we will call you to review the  results.   Testing/Procedures: None ordered    Follow-Up: At [facility], you and your health needs are our priority.  As part of our continuing mission to provide you with exceptional heart care, we have created designated Provider Care Teams.  These Care Teams include your primary Cardiologist (physician) and Advanced Practice Providers (APPs -  Physician Assistants and Nurse Practitioners) who all work together to provide you with the care you need, when you need it.  We recommend signing up for the patient portal called "MyChart".  Sign up information is provided on this After Visit Summary.  MyChart is used to connect with patients for Virtual Visits (Telemedicine).  Patients are able to view lab/test results, encounter notes, upcoming appointments, etc.  Non-urgent messages can be sent to your provider as well.   To learn more about what you can do with MyChart, go to [email or web address].    Your next appointment:   12 month(s)  The format for your next appointment:   In Person    Provider:   Dr. Thomasene Ripple    Other Instructions You have been referred to see [facility], someone will contact you with an appointment.     Adopting a Healthy Lifestyle.  Know what a healthy weight is for you (roughly BMI <25) and aim to maintain this   Aim for 7+ servings of fruits and vegetables daily   65-80+ fluid ounces of water or unsweet tea for healthy kidneys   Limit to max 1 drink of alcohol per day; avoid smoking/tobacco   Limit animal fats in diet for cholesterol and heart health - choose grass fed whenever  available   Avoid highly processed foods, and foods high in saturated/trans fats   Aim for low stress - take time to unwind and care for your mental health   Aim for 150 min of moderate intensity exercise weekly for heart health, and weights twice weekly for bone health   Aim for 7-9 hours of sleep daily   When it comes to diets, agreement about the  perfect plan isnt easy to find, even among the experts. Experts at the [facility] [employer] developed an idea known as the Healthy Eating Plate. Just imagine a plate divided into logical, healthy portions.   The emphasis is on diet quality:   Load up on vegetables and fruits - one-half of your plate: Aim for color and variety, and remember that potatoes dont count.   Go for whole grains - one-quarter of your plate: Whole wheat, barley, wheat berries, quinoa, oats, brown rice, and foods made with them. If you want pasta, go with whole wheat pasta.   Protein power - one-quarter of your plate: Fish, chicken, beans, and nuts are all healthy, versatile protein sources. Limit red meat.   The diet, however, does go beyond the plate, offering a few other suggestions.   Use healthy plant oils, such as olive, canola, soy, corn, sunflower and peanut. Check the labels, and avoid partially hydrogenated oil, which have unhealthy trans fats.   If youre thirsty, drink water. Coffee and tea are good in moderation, but skip sugary drinks and limit milk and dairy products to one or two daily servings.   The type of carbohydrate in the diet is more important than the amount. Some sources of carbohydrates, such as vegetables, fruits, whole grains, and beans-are healthier than others.   Finally, stay active  Signed, Thomasene Ripple, DO  05/12/2021 5:03 PM    [facility]

## 2021-05-12 NOTE — Patient Instructions (Addendum)
Medication Instructions:  Your physician recommends that you continue on your current medications as directed. Please refer to the Current Medication list given to you today.  *If you need a refill on your cardiac medications before your next appointment, please call your pharmacy*   Lab Work: None ordered   If you have labs (blood work) drawn today and your tests are completely normal, you will receive your results only by: MyChart Message (if you have MyChart) OR A paper copy in the mail If you have any lab test that is abnormal or we need to change your treatment, we will call you to review the results.   Testing/Procedures: None ordered    Follow-Up: At [facility], you and your health needs are our priority.  As part of our continuing mission to provide you with exceptional heart care, we have created designated Provider Care Teams.  These Care Teams include your primary Cardiologist (physician) and Advanced Practice Providers (APPs -  Physician Assistants and Nurse Practitioners) who all work together to provide you with the care you need, when you need it.  We recommend signing up for the patient portal called "MyChart".  Sign up information is provided on this After Visit Summary.  MyChart is used to connect with patients for Virtual Visits (Telemedicine).  Patients are able to view lab/test results, encounter notes, upcoming appointments, etc.  Non-urgent messages can be sent to your provider as well.   To learn more about what you can do with MyChart, go to [email or web address].    Your next appointment:   12 month(s)  The format for your next appointment:   In Person    Provider:   Dr. Thomasene Ripple    Other Instructions You have been referred to see [facility], someone will contact you with an appointment.

## 2021-05-19 ENCOUNTER — Ambulatory Visit: Payer: Medicare Other | Admitting: Internal Medicine

## 2021-05-19 ENCOUNTER — Encounter: Payer: Self-pay | Admitting: Internal Medicine

## 2021-05-19 ENCOUNTER — Other Ambulatory Visit: Payer: Self-pay

## 2021-05-19 VITALS — BP 122/78 | HR 78 | Ht 60.0 in | Wt 289.4 lb

## 2021-05-19 DIAGNOSIS — E1142 Type 2 diabetes mellitus with diabetic polyneuropathy: Secondary | ICD-10-CM | POA: Diagnosis not present

## 2021-05-19 DIAGNOSIS — E782 Mixed hyperlipidemia: Secondary | ICD-10-CM

## 2021-05-19 NOTE — Progress Notes (Signed)
Patient ID: Nicole Mccann, female   DOB: 1946-07-02, 75 y.o.   MRN: [id number]   This visit occurred during the SARS-CoV-2 public health emergency.  Safety protocols were in place, including screening questions prior to the visit, additional usage of staff PPE, and extensive cleaning of exam room while observing appropriate contact time as indicated for disinfecting solutions.   HPI: Nicole Mccann is a 75 y.o.-year-old female, presenting for follow-up for DM2, dx in initially in 1965 with GDM, then again GDM in 1974, non-insulin-dependent, controlled, with long-term complications (PN).  Last visit 4 months ago. She is here with her daughter who offers part of the history especially related to her blood sugars, diet, medications.  Interim history: Before last visit, she figured out that the sweet liquor that she was drinking at night was giving her headaches and she stopped it and headaches improved >> still having HAs, but less frequent now. She has increased urination and leg swelling- on Lasix. Her asthma/shortness of breath is worse. She will see pulmonology.  Reviewed HbA1c levels: Lab Results  Component Value Date   HGBA1C 6.5 11/26/2020   HGBA1C 6.8 (A) 09/05/2020   HGBA1C 6.6 (A) 05/02/2020   HGBA1C 6.3 (A) 01/09/2019   HGBA1C 5.8 (A) 09/05/2018   HGBA1C 6.3 (A) 04/18/2018   HGBA1C 6.4 12/12/2017   HGBA1C 6.2 02/09/2017   HGBA1C 6.1 01/21/2016   HGBA1C 6.0 (H) 07/08/2012   HGBA1C 6.1 (H) 07/07/2012   HGBA1C 5.8 02/13/2010   HGBA1C 6.0 06/22/2007   HGBA1C 6.0 03/20/2007   She is on: - Glipizide XL 2.5 mg (approximately 40 minutes) before breakfast -she feelt that this is also causing GI symptoms in the past, but now tolerated well SGLT2 inh were very expensive.  We had to stop Metformin ER due to significant diarrhea.  Pt checks her sugars once a day: - am: 114-142 >> 120-145, 150 >> 101-157 >> n/c >> 101-138 - 2h after b'fast: n/c >> 186, 198 - before lunch:  130-150 (combined L + D) >> 144-184 >> 120, 170 (sweets the night before; leg pain) - 2h after lunch: n/c >> 133 >> n/c - before dinner: 108-142 >> 138 >> n/c >> 148 >> 107, 140 >> ?  - 2h after dinner: n/c >> 127-139 >> n/c - bedtime: n/c - nighttime: n/c Lowest sugar was 90 ... >> 101 >> 107 >> 101; she has hypoglycemia awareness in the 70s. Highest: 158 ... >> 198 >> 184 >> 170.  Glucometer: One Touch Verio  Pt's meals are: - Breakfast: cereals, yoghurt (Activia), pastries, tea, honey - Lunch: salad, leftover sandwich, fruit - Dinner: meat, veggies, potato - Snacks: Sweets, sweet liquor Fredric Mare) >> stopped due to headaches  No CKD, last BUN/creatinine:  Lab Results  Component Value Date   BUN 13 03/04/2021   BUN 16 11/26/2020   CREATININE 0.75 03/04/2021   CREATININE 0.80 11/26/2020   + HL; last set of lipids: Lab Results  Component Value Date   CHOL 161 09/05/2020   HDL 49.30 09/05/2020   LDLCALC 154 02/09/2017   LDLDIRECT 92.0 09/05/2020   TRIG 220.0 (H) 09/05/2020   CHOLHDL 3 09/05/2020  She refused statins in the past but we were able to start rosuvastatin 10 mg daily, which she now tolerates well.  In 08/2020  we added fish oil 1 capsule of 1000 mg a day  - Last eye exam was in 2021: no DR  - no numbness and tingling in her feet.  Pt has FH of DM in aunts.  She has OSA and is on a CPAP.  In summer 2019, she had hAs >> temporal artery Bx negative. She developed anaphylaxis to Vancomycin while in the hospital.  ROS: + see HPI  Past Medical History:  Diagnosis Date   Adjustment disorder with mixed anxiety and depressed mood 03/14/2015   Dr Madaline Guthrie actively treating [place]    ALLERGIC RHINITIS 04/04/2009   Qualifier: Diagnosis of  By: Alwyn Ren MD, Chrissie Noa     Allergy    Anxiety    Dr Madaline Guthrie   Asthma    ASTHMA 04/04/2009   Qualifier: Diagnosis of  By: Alwyn Ren MD, [name]   Status Asthmaticus 10/2004     Atypical seizure (HCC) 06/07/2012   BENIGN POSITIONAL  VERTIGO 11/06/2009   Qualifier: Diagnosis of  By: Linford Arnold MD, Catherine     Depression    Dr Madaline Guthrie   Diabetes mellitus    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 08/17/13 seen in the emergency room; imaging negative. No labs. Referred to Dr. Pearletha Forge who recommended nonsteroidals and elevation. 10/18-11/3/14 in [place] for land and river cruise trip.  08/29/13 seen in ER in [place]; topical antibiotics & oral Ciprobay Rxed. 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 Xarelto 15 mg initiated 06/05/12 by Dr Jeraldine Loots , [facility] and increased to one pill twice a day. Despite this she had persistent thrombosis in the cephalic vein in the elbow and the upper forearm areas prompting change to Lovenox 110 mg e   Pulmonary embolism (HCC) 07/09/2012   Presumed based on intermediate VQ 07/07/2012 and high clinical probability.  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 12/17/12 Note: I recommended discussion with [name],Neurologist; & Dr Margaretmary Bayley, Diabetologist    Type 2 diabetes, controlled, with peripheral neuropathy (HCC) 04/04/2009   Qualifier: Diagnosis of  By: Alwyn Ren MD, Chrissie Noa   Dr Margaretmary Bayley, Endo Dr Emily Filbert , Ophth seen anually     Variants of migraine, not elsewhere classified, without mention of intractable migraine without mention of status migrainosus 05/22/2013   Past Surgical History:  Procedure Laterality Date  APPENDECTOMY     ARTERY BIOPSY Left 04/14/2018   Procedure: BIOPSY TEMPORAL ARTERY;  Surgeon: Sherren Kerns, MD;  Location: [facility] OR;  Service: Vascular;  Laterality: Left;   BREAST EXCISIONAL BIOPSY Right 1998   CHOLECYSTECTOMY     COLONOSCOPY  2006   [facility]   rotator cuff surgery     TONSILLECTOMY      Social History   Socioeconomic History   Marital status: Married    Spouse name: Lars Mage   Number of children: 2   Years of education: College   Highest education level: Not on file  Occupational History   Not on file  Tobacco Use   Smoking status: Never   Smokeless tobacco: Never  Substance and Sexual Activity   Alcohol use: Yes    Alcohol/week: 3.0 standard drinks    Types: 3 Glasses of wine per week    Comment: 1/2 glass wine daily   Drug use: No   Sexual activity: Never  Other Topics Concern   Not on file  Social History Narrative   Patient is married Lars Mage) and lives at home with her husband.   Patient has two adult children.   Patient is retired.   Patient has a college education.   Patient is right-handed.   Patient drinks one cup of tea daily.   Social Determinants of Health   Financial Resource Strain: Low Risk    Difficulty of Paying Living Expenses:  Not hard at all  Food Insecurity: No Food Insecurity   Worried About Programme researcher, broadcasting/film/video in the Last Year: Never true   Ran Out of Food in the Last Year: Never true  Transportation Needs: No Transportation Needs   Lack of Transportation (Medical): No   Lack of Transportation (Non-Medical): No  Physical Activity: Not on file  Stress: Not on file  Social Connections: Not on file  Intimate Partner Violence: Not on file   Current Outpatient Medications on File Prior to Visit  Medication Sig Dispense Refill   albuterol (VENTOLIN HFA) 108 (90 Base) MCG/ACT inhaler Inhale 2 puffs into the lungs every 6 (six) hours as needed for wheezing or shortness of breath. 18 g 6   Bacillus Coagulans-Inulin (PROBIOTIC) 1-250 BILLION-MG CAPS Take 1 tablet by mouth daily at 6 (six) AM.     Cholecalciferol (VITAMIN D) 50 MCG (2000 UT) CAPS Take 1 capsule by mouth daily at 6 (six) AM.     FLUoxetine (PROZAC) 20 MG capsule Take 1 capsule (20 mg total) by mouth daily. 90 capsule 3   furosemide (LASIX) 20 MG tablet Take 1 tablet (20 mg total) by mouth 3 (three) times a week. Take once on Tuesday, Thursday and Saturday. 52 tablet 2   gabapentin (NEURONTIN) 100 MG capsule Take 1 capsule (100 mg total) by mouth 3 (three) times daily. (Patient taking differently: Take 100 mg by mouth at bedtime.) 270 capsule 1   glipiZIDE (GLUCOTROL XL) 2.5 MG 24 hr tablet TAKE 1 TABLET BY MOUTH WITH BREAKFAST 90 tablet 2   loratadine (CLARITIN) 10 MG tablet Take 10 mg by mouth daily.     potassium chloride (KLOR-CON) 10 MEQ tablet Take 10 mEq by mouth 3 (three) times a week. Take on days you use Lasix     rizatriptan (MAXALT) 10 MG tablet Take 1 tablet (10 mg total) by mouth as needed for migraine. May repeat in 2 hours if needed 12 tablet 3   rosuvastatin (CRESTOR) 10 MG tablet Take 1 tablet (  10 mg total) by mouth daily. 90 tablet 3   No current facility-administered medications on file prior to visit.   Allergies  Allergen  Reactions   Mushroom Ext Cmplx(Shiitake-Reishi-Mait) Anaphylaxis   Shellfish Allergy Anaphylaxis   Sulfonamide Derivatives Anaphylaxis   Vancomycin Anaphylaxis   Fluticasone-Salmeterol Other (See Comments)    Feels like something in the throat   Butalbital-Aspirin-Caffeine Other (See Comments)    UNSPECIFIED REACTION    Montelukast Sodium Other (See Comments)    Feels like she's running out of breath   Penicillins     UNSPECIFIED REACTION      Childhood allergy Has patient had a PCN reaction causing immediate rash, facial/tongue/throat swelling, SOB or lightheadedness with hypotension: Unknown Has patient had a PCN reaction causing severe rash involving mucus membranes or skin necrosis: Unknown Has patient had a PCN reaction that required hospitalization: No Has patient had a PCN reaction occurring within the last 10 years: No If all of the above answers are "NO", then may proceed with Cephalosporin use.     Acetaminophen Other (See Comments)    Stomach discomfort   Clarithromycin Nausea And Vomiting   Iodinated Diagnostic Agents Other (See Comments)    Bad headache; no prep needed   Levofloxacin Diarrhea and Nausea And Vomiting    Dizziness Believes it was due to a high dosage of this medication   Orphenadrine Nausea And Vomiting   Family History  Problem Relation Age of Onset   Stomach cancer Father    Coronary artery disease Father    Breast cancer Sister 30       breast cancer   Diabetes Maternal Aunt    Diabetes Paternal Aunt    Prostate cancer Brother    Cancer Daughter    Stroke Neg Hx    PE: BP 122/78 (BP Location: Right Arm, Patient Position: Sitting, Cuff Size: Normal)   Pulse 78   Ht 5' (1.524 m)   Wt 289 lb 6.4 oz (131.3 kg)   SpO2 96%   BMI 56.52 kg/m  Wt Readings from Last 3 Encounters:  05/19/21 289 lb 6.4 oz (131.3 kg)  05/12/21 290 lb (131.5 kg)  03/26/21 286 lb (129.7 kg)   Constitutional: overweight, in NAD, + using a walker Eyes: PERRLA,  EOMI, no exophthalmos ENT: moist mucous membranes, no thyromegaly, no cervical lymphadenopathy Cardiovascular: RRR, No MRG,+ LE edema B Respiratory: CTA B Gastrointestinal: abdomen soft, NT, ND, BS+ Musculoskeletal: no deformities, strength intact in all 4 Skin: moist, warm, + stasis dermatitis bilateral legs Neurological: no tremor with outstretched hands, DTR normal in all 4  ASSESSMENT: 1. DM2, non-insulin-dependent, now more controlled, with complications - PN  2. HL  PLAN:  1. Patient with fairly well-controlled type 2 diabetes, previously on metformin IR and ER, which she was taking inconsistently due to diarrhea and also fecal incontinence.  We switched to low-dose glipizide XL but she felt that this was causing her GI symptoms and labs.  Retrospectively, her daughter felt that the GI symptoms may have been caused by a protein bar that she was eating in the morning.  Therefore, we restarted the sulfonylurea -not tolerated well.  PCP also referred her to nutrition. -In the past we discussed about other options also: SGLT2 inhibitors were too expensive in the past.  I sent a new prescription for these to her pharmacy in 08/2020, but she was not able to start them due to price. Daughter called the insurance and SGLT2 inhibitors are not covered. GLP-1  receptor agonist may upset her stomach more.  She refused injectables. She refused insulin. Actos may cause some more swelling.   She could not tolerate Metformin. -Before last visit HbA1c was excellent, at 6.5%. -At last visit, sugars were still high in the morning but upon questioning, most of these were checked when she was drinking sweet liquor at night.  Before last visit, she stopped this. -At today's visit, she has not changed her sugars consistently, but when she is checking, they are mostly at goal.  He has mild hyperglycemic exceptions when she is in pain or after dietary indiscretions.  Of note, she stays up late at night and then  sleeps late. I advised her that she can increase the dose of her glipizide to 2 tablets in days in which she may eat more or have desserts. - I suggested to:  Patient Instructions  Please continue: - Glipizide XL 2.5 mg daily 15-30 min before b'fast. May increase the dose to 5 mg if needed.  Please return in 4 months with your sugar log.  - we checked her HbA1c: 6.4% (lower) - advised to check sugars at different times of the day - 1x a day, rotating check times - advised for yearly eye exams >> she is UTD - return to clinic in 4 months  2.  HL -Reviewed latest lipid panel from 08/2020: LDL above goal, triglycerides also high: Lab Results  Component Value Date   CHOL 161 09/05/2020   HDL 49.30 09/05/2020   LDLCALC 154 02/09/2017   LDLDIRECT 92.0 09/05/2020   TRIG 220.0 (H) 09/05/2020   CHOLHDL 3 09/05/2020  -Previously refused statins but currently on Crestor 10 mg daily, tolerating well.  We also added fish oil 1000 mg daily.  -She will be due for another lipid panel at next visit   Carlus Pavlov, MD PhD [facility] Endocrinology

## 2021-05-19 NOTE — Patient Instructions (Addendum)
Please continue: - Glipizide XL 2.5 mg daily 15-30 min before b'fast. May increase the dose to 5 mg if needed.  Please return in 4 months with your sugar log.

## 2021-06-09 ENCOUNTER — Ambulatory Visit (INDEPENDENT_AMBULATORY_CARE_PROVIDER_SITE_OTHER): Payer: Medicare Other

## 2021-06-09 VITALS — Ht 60.0 in | Wt 289.0 lb

## 2021-06-09 DIAGNOSIS — Z0000 Encounter for general adult medical examination without abnormal findings: Secondary | ICD-10-CM | POA: Diagnosis not present

## 2021-06-09 DIAGNOSIS — Z780 Asymptomatic menopausal state: Secondary | ICD-10-CM

## 2021-06-09 NOTE — Patient Instructions (Signed)
[name] , Thank you for taking time to complete your Medicare Wellness Visit. I appreciate your ongoing commitment to your health goals. Please review the following plan we discussed and let me know if I can assist you in the future.   Screening recommendations/referrals: Colonoscopy: No longer required Mammogram: Completed 12/31/2020-Due 12/31/2021 Bone Density: Ordered today. Someone will be calling you to schedule. Recommended yearly ophthalmology/optometry visit for glaucoma screening and checkup Recommended yearly dental visit for hygiene and checkup  Vaccinations: Influenza vaccine: Up to date Pneumococcal vaccine: Discuss Prevnar-20 with pulmonologist Tdap vaccine: Up to date-Due-08/18/2023 Shingles vaccine: Discuss with pharmacy   Covid-19:Up to date  Advanced directives: Please bring a copy for your chart  Conditions/risks identified: See problem list  Next appointment: Follow up in one year for your annual wellness visit 06/14/2022 @ 3:40   Preventive Care 65 Years and Older, Female Preventive care refers to lifestyle choices and visits with your health care provider that can promote health and wellness. What does preventive care include? A yearly physical exam. This is also called an annual well check. Dental exams once or twice a year. Routine eye exams. Ask your health care provider how often you should have your eyes checked. Personal lifestyle choices, including: Daily care of your teeth and gums. Regular physical activity. Eating a healthy diet. Avoiding tobacco and drug use. Limiting alcohol use. Practicing safe sex. Taking low-dose aspirin every day. Taking vitamin and mineral supplements as recommended by your health care provider. What happens during an annual well check? The services and screenings done by your health care provider during your annual well check will depend on your age, overall health, lifestyle risk factors, and family history of  disease. Counseling  Your health care provider may ask you questions about your: Alcohol use. Tobacco use. Drug use. Emotional well-being. Home and relationship well-being. Sexual activity. Eating habits. History of falls. Memory and ability to understand (cognition). Work and work Astronomer. Reproductive health. Screening  You may have the following tests or measurements: Height, weight, and BMI. Blood pressure. Lipid and cholesterol levels. These may be checked every 5 years, or more frequently if you are over 62 years old. Skin check. Lung cancer screening. You may have this screening every year starting at age 53 if you have a 30-pack-year history of smoking and currently smoke or have quit within the past 15 years. Fecal occult blood test (FOBT) of the stool. You may have this test every year starting at age 67. Flexible sigmoidoscopy or colonoscopy. You may have a sigmoidoscopy every 5 years or a colonoscopy every 10 years starting at age 25. Hepatitis C blood test. Hepatitis B blood test. Sexually transmitted disease (STD) testing. Diabetes screening. This is done by checking your blood sugar (glucose) after you have not eaten for a while (fasting). You may have this done every 1-3 years. Bone density scan. This is done to screen for osteoporosis. You may have this done starting at age 20. Mammogram. This may be done every 1-2 years. Talk to your health care provider about how often you should have regular mammograms. Talk with your health care provider about your test results, treatment options, and if necessary, the need for more tests. Vaccines  Your health care provider may recommend certain vaccines, such as: Influenza vaccine. This is recommended every year. Tetanus, diphtheria, and acellular pertussis (Tdap, Td) vaccine. You may need a Td booster every 10 years. Zoster vaccine. You may need this after age 13. Pneumococcal 13-valent conjugate (  PCV13) vaccine. One  dose is recommended after age 4. Pneumococcal polysaccharide (PPSV23) vaccine. One dose is recommended after age 90. Talk to your health care provider about which screenings and vaccines you need and how often you need them. This information is not intended to replace advice given to you by your health care provider. Make sure you discuss any questions you have with your health care provider. Document Released: 11/21/2015 Document Revised: 07/14/2016 Document Reviewed: 08/26/2015 Elsevier Interactive Patient Education  2017 [place] Prevention in the Home Falls can cause injuries. They can happen to people of all ages. There are many things you can do to make your home safe and to help prevent falls. What can I do on the outside of my home? Regularly fix the edges of walkways and driveways and fix any cracks. Remove anything that might make you trip as you walk through a door, such as a raised step or threshold. Trim any bushes or trees on the path to your home. Use bright outdoor lighting. Clear any walking paths of anything that might make someone trip, such as rocks or tools. Regularly check to see if handrails are loose or broken. Make sure that both sides of any steps have handrails. Any raised decks and porches should have guardrails on the edges. Have any leaves, snow, or ice cleared regularly. Use sand or salt on walking paths during winter. Clean up any spills in your garage right away. This includes oil or grease spills. What can I do in the bathroom? Use night lights. Install grab bars by the toilet and in the tub and shower. Do not use towel bars as grab bars. Use non-skid mats or decals in the tub or shower. If you need to sit down in the shower, use a plastic, non-slip stool. Keep the floor dry. Clean up any water that spills on the floor as soon as it happens. Remove soap buildup in the tub or shower regularly. Attach bath mats securely with double-sided  non-slip rug tape. Do not have throw rugs and other things on the floor that can make you trip. What can I do in the bedroom? Use night lights. Make sure that you have a light by your bed that is easy to reach. Do not use any sheets or blankets that are too big for your bed. They should not hang down onto the floor. Have a firm chair that has side arms. You can use this for support while you get dressed. Do not have throw rugs and other things on the floor that can make you trip. What can I do in the kitchen? Clean up any spills right away. Avoid walking on wet floors. Keep items that you use a lot in easy-to-reach places. If you need to reach something above you, use a strong step stool that has a grab bar. Keep electrical cords out of the way. Do not use floor polish or wax that makes floors slippery. If you must use wax, use non-skid floor wax. Do not have throw rugs and other things on the floor that can make you trip. What can I do with my stairs? Do not leave any items on the stairs. Make sure that there are handrails on both sides of the stairs and use them. Fix handrails that are broken or loose. Make sure that handrails are as long as the stairways. Check any carpeting to make sure that it is firmly attached to the stairs. Fix any carpet that is  loose or worn. Avoid having throw rugs at the top or bottom of the stairs. If you do have throw rugs, attach them to the floor with carpet tape. Make sure that you have a light switch at the top of the stairs and the bottom of the stairs. If you do not have them, ask someone to add them for you. What else can I do to help prevent falls? Wear shoes that: Do not have high heels. Have rubber bottoms. Are comfortable and fit you well. Are closed at the toe. Do not wear sandals. If you use a stepladder: Make sure that it is fully opened. Do not climb a closed stepladder. Make sure that both sides of the stepladder are locked into place. Ask  someone to hold it for you, if possible. Clearly mark and make sure that you can see: Any grab bars or handrails. First and last steps. Where the edge of each step is. Use tools that help you move around (mobility aids) if they are needed. These include: Canes. Walkers. Scooters. Crutches. Turn on the lights when you go into a dark area. Replace any light bulbs as soon as they burn out. Set up your furniture so you have a clear path. Avoid moving your furniture around. If any of your floors are uneven, fix them. If there are any pets around you, be aware of where they are. Review your medicines with your doctor. Some medicines can make you feel dizzy. This can increase your chance of falling. Ask your doctor what other things that you can do to help prevent falls. This information is not intended to replace advice given to you by your health care provider. Make sure you discuss any questions you have with your health care provider. Document Released: 08/21/2009 Document Revised: 04/01/2016 Document Reviewed: 11/29/2014 Elsevier Interactive Patient Education  2017 [employer].

## 2021-06-09 NOTE — Progress Notes (Addendum)
Subjective:   Nicole Mccann is a 75 y.o. female who presents for Medicare Annual (Subsequent) preventive examination.  I connected with [name] today by telephone and verified that I am speaking with the correct person using two identifiers. Location patient: home Location provider: work Persons participating in the virtual visit: patient, daughter, Engineer, civil (consulting).    I discussed the limitations, risks, security and privacy concerns of performing an evaluation and management service by telephone and the availability of in person appointments. I also discussed with the patient that there may be a patient responsible charge related to this service. The patient expressed understanding and verbally consented to this telephonic visit.    Interactive audio and video telecommunications were attempted between this provider and patient, however failed, due to patient having technical difficulties OR patient did not have access to video capability.  We continued and completed visit with audio only.  Some vital signs may be absent or patient reported.   Time Spent with patient on telephone encounter: 30 minutes   Review of Systems     Cardiac Risk Factors include: advanced age (>91men, >37 women);diabetes mellitus;dyslipidemia;obesity (BMI >30kg/m2);sedentary lifestyle     Objective:    Today's Vitals   06/09/21 1541  Weight: 289 lb (131.1 kg)  Height: 5' (1.524 m)   Body mass index is 56.44 kg/m.  Advanced Directives 06/09/2021 07/07/2020 05/22/2020 04/13/2018 03/07/2018 11/09/2017 01/03/2017  Does Patient Have a Medical Advance Directive? Yes Yes Yes Yes Yes Yes Yes  Type of Estate agent of [place];Living will - Healthcare Power of [place];Living will Healthcare Power of [place];Living will Healthcare Power of [place];Living will Living will Living will;Healthcare Power of Attorney  Does patient want to make changes to medical advance directive? - No - Patient declined No -  Patient declined - No - Patient declined - No - Patient declined  Copy of Healthcare Power of Attorney in Chart? No - copy requested - No - copy requested - No - copy requested - No - copy requested  Would patient like information on creating a medical advance directive? - - - - - - -  Pre-existing out of facility DNR order (yellow form or pink MOST form) - - - - - - -    Current Medications (verified) Outpatient Encounter Medications as of 06/09/2021  Medication Sig   albuterol (VENTOLIN HFA) 108 (90 Base) MCG/ACT inhaler Inhale 2 puffs into the lungs every 6 (six) hours as needed for wheezing or shortness of breath.   Bacillus Coagulans-Inulin (PROBIOTIC) 1-250 BILLION-MG CAPS Take 1 tablet by mouth daily at 6 (six) AM.   Cholecalciferol (VITAMIN D) 50 MCG (2000 UT) CAPS Take 1 capsule by mouth daily at 6 (six) AM.   FLUoxetine (PROZAC) 20 MG capsule Take 1 capsule (20 mg total) by mouth daily.   furosemide (LASIX) 20 MG tablet Take 1 tablet (20 mg total) by mouth 3 (three) times a week. Take once on Tuesday, Thursday and Saturday.   gabapentin (NEURONTIN) 100 MG capsule Take 1 capsule (100 mg total) by mouth 3 (three) times daily. (Patient taking differently: Take 100 mg by mouth daily.)   glipiZIDE (GLUCOTROL XL) 2.5 MG 24 hr tablet TAKE 1 TABLET BY MOUTH WITH BREAKFAST   guaiFENesin (MUCINEX) 600 MG 12 hr tablet Take by mouth 2 (two) times daily.   loratadine (CLARITIN) 10 MG tablet Take 10 mg by mouth daily.   potassium chloride (KLOR-CON) 10 MEQ tablet Take 10 mEq by mouth 3 (three) times a  week. Take on days you use Lasix   rizatriptan (MAXALT) 10 MG tablet Take 1 tablet (10 mg total) by mouth as needed for migraine. May repeat in 2 hours if needed   rosuvastatin (CRESTOR) 10 MG tablet Take 1 tablet (10 mg total) by mouth daily.   No facility-administered encounter medications on file as of 06/09/2021.    Allergies (verified) Mushroom ext cmplx(shiitake-reishi-mait), Shellfish allergy,  Sulfonamide derivatives, Vancomycin, Fluticasone-salmeterol, Butalbital-aspirin-caffeine, Montelukast sodium, Penicillins, Acetaminophen, Clarithromycin, Iodinated diagnostic agents, Levofloxacin, and Orphenadrine   History: Past Medical History:  Diagnosis Date   Adjustment disorder with mixed anxiety and depressed mood 03/14/2015   Dr Madaline Guthrie actively treating [place]    ALLERGIC RHINITIS 04/04/2009   Qualifier: Diagnosis of  By: Alwyn Ren MD, Chrissie Noa     Allergy    Anxiety    Dr Madaline Guthrie   Asthma    ASTHMA 04/04/2009   Qualifier: Diagnosis of  By: Alwyn Ren MD, [name]   Status Asthmaticus 10/2004     Atypical seizure (HCC) 06/07/2012   BENIGN POSITIONAL VERTIGO 11/06/2009   Qualifier: Diagnosis of  By: Linford Arnold MD, Catherine     Depression    Dr Madaline Guthrie   Diabetes mellitus    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 08/17/13 seen in the emergency room; imaging negative. No labs. Referred to Dr. Pearletha Forge who recommended nonsteroidals and elevation. 10/18-11/3/14 in [place] for land and river cruise trip.  08/29/13 seen in ER in [place]; topical antibiotics & oral Ciprobay Rxed. 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  Xarelto 15 mg initiated 06/05/12 by Dr Jeraldine Loots , [facility] and increased to one pill twice a day. Despite this she had persistent thrombosis in the cephalic vein in the elbow and the upper forearm areas prompting change to Lovenox 110 mg e   Pulmonary embolism (HCC) 07/09/2012   Presumed based on intermediate VQ 07/07/2012 and high clinical probability.  Definitive CT angiogram was not possible due to contrast  intolerance/allergy    Right shoulder pain 04/04/2015   Rotator cuff (capsule) sprain 03/13/2015   Seizure disorder, temporal lobe, intractable (HCC) 10/17/2014   Severe obesity (BMI >= 40) (HCC) 10/17/2014   Shingles 07/2013   Spinal stenosis of lumbar region 12/17/2012   S/P suboptimal response to Chiropractry ;Dr Kai Levins referred her  to Dr Sherwood [facility] [phone]; FAX [phone] MRI 12/15/12 : stenosis @ L3-4; L 4-5  in context of DDD ESI recommended . 12/17/12 Note: I recommended discussion with [name],Neurologist; & Dr Margaretmary Bayley, Diabetologist    Type 2 diabetes, controlled, with peripheral neuropathy (HCC) 04/04/2009   Qualifier: Diagnosis of  By: Alwyn Ren MD, Chrissie Noa   Dr Margaretmary Bayley, Endo Dr Emily Filbert , Ophth seen anually     Variants of migraine, not elsewhere classified, without mention of intractable migraine without mention of status migrainosus 05/22/2013   Past Surgical History:  Procedure Laterality Date   APPENDECTOMY     ARTERY BIOPSY Left 04/14/2018   Procedure: BIOPSY TEMPORAL ARTERY;  Surgeon: Sherren Kerns, MD;  Location: [facility] OR;  Service: Vascular;  Laterality: Left;   BREAST EXCISIONAL BIOPSY Right 1998   CHOLECYSTECTOMY     COLONOSCOPY  2006   [facility]   rotator cuff surgery     TONSILLECTOMY      Family History  Problem Relation Age of Onset   Stomach cancer Father    Coronary artery disease Father    Breast cancer Sister 12       breast cancer   Diabetes Maternal Aunt    Diabetes Paternal Aunt    Prostate cancer Brother     Cancer Daughter    Stroke Neg Hx    Social History   Socioeconomic History   Marital status: Married    Spouse name: Lars Mage   Number of children: 2   Years of education: College   Highest education level: Not on file  Occupational History   Not on file  Tobacco Use   Smoking status: Never   Smokeless tobacco: Never  Substance and Sexual Activity   Alcohol use: Yes    Alcohol/week: 3.0 standard drinks    Types: 3 Glasses of wine per week    Comment: 1/2 glass wine daily   Drug use: No   Sexual activity: Never  Other Topics Concern   Not on file  Social History Narrative   Patient is married Lars Mage) and lives at home with her husband.   Patient has two adult children.   Patient is retired.   Patient has a college education.   Patient is right-handed.   Patient drinks one cup of tea daily.   Social Determinants of Health   Financial Resource Strain: Low Risk    Difficulty of Paying Living Expenses: Not hard at all  Food Insecurity: No Food Insecurity   Worried About Programme researcher, broadcasting/film/video in the Last Year: Never true   Ran Out of Food in the Last Year: Never true  Transportation Needs: No Transportation Needs   Lack of Transportation (Medical): No   Lack of Transportation (Non-Medical): No  Physical Activity: Inactive   Days of Exercise per Week: 0 days   Minutes of Exercise per Session: 0 min  Stress: No Stress Concern Present   Feeling of Stress : Not at all  Social Connections: Moderately Integrated   Frequency of Communication with Friends and Family: More than three times a week   Frequency of Social Gatherings with Friends and Family: More than three times a week   Attends Religious Services: Never   Database administrator or Organizations: Yes   Attends Engineer, structural: 1 to 4 times per year   Marital Status: Married    Tobacco Counseling Counseling given: Not Answered   Clinical Intake:  Pre-visit preparation completed: Yes         Nutritional Status: BMI 25 -29 Overweight Nutritional Risks: None Diabetes: Yes CBG done?: No Did pt. bring in CBG monitor from home?: No (phone visit)  How often do you need to have someone help you when you read instructions, pamphlets, or other written materials from your  doctor or pharmacy?: 1 - Never  Diabetes:  Is the patient diabetic?  Yes  If diabetic, was a CBG obtained today?  No  Did the patient bring in their glucometer from home?  No phone visit How often do you monitor your CBG's? occasionally.   Financial Strains and Diabetes Management:  Are you having any financial strains with the device, your supplies or your medication? No .  Does the patient want to be seen by Chronic Care Management for management of their diabetes?  No  Would the patient like to be referred to a Nutritionist or for Diabetic Management?  No   Diabetic Exams:  Diabetic Eye Exam: Completed 09/11/2020.   Diabetic Foot Exam: Completed 11/26/2020.    Interpreter Needed?: No  Information entered by :: Thomasenia SalesMartha Abdiel Blackerby LPN   Activities of Daily Living In your present state of health, do you have any difficulty performing the following activities: 06/09/2021  Hearing? N  Vision? N  Difficulty concentrating or making decisions? Y  Comment occasionally  Walking or climbing stairs? N  Dressing or bathing? N  Doing errands, shopping? N  Preparing Food and eating ? N  Using the Toilet? N  In the past six months, have you accidently leaked urine? Y  Comment occasionally  Do you have problems with loss of bowel control? N  Managing your Medications? N  Managing your Finances? N  Housekeeping or managing your Housekeeping? N  Some recent data might be hidden    Patient Care Team: Copland, Gwenlyn FoundJessica C, MD as PCP - General (Family Medicine) Dohmeier, Porfirio Mylararmen, MD as Consulting Physician (Neurology) Carlus PavlovGherghe, Cristina, MD as Consulting Physician (Internal Medicine)  Indicate any recent Medical  Services you may have received from other than [facility] providers in the past year (date may be approximate).     Assessment:   This is a routine wellness examination for [place].  Hearing/Vision screen Hearing Screening - Comments:: No issues Vision Screening - Comments:: Reading glasses Last eye exam-09/2020-Dr. Emily FilbertGould  Dietary issues and exercise activities discussed: Current Exercise Habits: The patient does not participate in regular exercise at present, Exercise limited by: respiratory conditions(s) (Asthma)   Goals Addressed             This Visit's Progress    Increase physical activity   On track      Depression Screen PHQ 2/9 Scores 06/09/2021 04/27/2021 05/22/2020 03/07/2018 01/03/2017 11/26/2016 05/05/2016  PHQ - 2 Score 1 0 0 0 0 0 0  PHQ- 9 Score - - - - - - -  Exception Documentation - - - - - Other- indicate reason in comment box -    Fall Risk Fall Risk  06/09/2021 04/27/2021 07/07/2020 05/22/2020 03/07/2018  Falls in the past year? 0 0 0 1 No  Number falls in past yr: 0 0 - 0 -  Injury with Fall? 0 0 - 0 -  Risk for fall due to : - - - - -  Follow up Falls prevention discussed Falls evaluation completed - Education provided;Falls prevention discussed -    FALL RISK PREVENTION PERTAINING TO THE HOME:  Any stairs in or around the home? Yes  If so, are there any without handrails? No  Home free of loose throw rugs in walkways, pet beds, electrical cords, etc? Yes  Adequate lighting in your home to reduce risk of falls? Yes   ASSISTIVE DEVICES UTILIZED TO PREVENT FALLS:  Life alert? No  Use of a cane, walker or w/c? Yes  Grab bars in the bathroom? Yes  Shower chair or bench in shower? Yes  Elevated toilet seat or a handicapped toilet? No   TIMED UP AND GO:  Was the test performed? No . Phone visit   Cognitive Function:Daughter assisted with exam & states patient occasionally forgets to take her medication.  MMSE - Mini Mental State Exam 01/03/2017  Orientation  to time 5  Orientation to Place 5  Registration 3  Attention/ Calculation 5  Recall 3  Language- name 2 objects 2  Language- repeat 1  Language- follow 3 step command 3  Language- read & follow direction 1  Write a sentence 1  Copy design 1  Total score 30   Montreal Cognitive Assessment  06/03/2016  Visuospatial/ Executive (0/5) 5  Naming (0/3) 3  Attention: Read list of digits (0/2) 2  Attention: Read list of letters (0/1) 1  Attention: Serial 7 subtraction starting at 100 (0/3) 3  Language: Repeat phrase (0/2) 1  Language : Fluency (0/1) 0  Abstraction (0/2) 2  Delayed Recall (0/5) 4  Orientation (0/6) 5  Total 26  Adjusted Score (based on education) 26      Immunizations Immunization History  Administered Date(s) Administered   Fluad Quad(high Dose 65+) 08/05/2020   Influenza Split 07/25/2012   Influenza Whole 09/27/2007, 08/21/2008, 09/17/2009   Influenza, High Dose Seasonal PF 09/05/2018   Influenza,inj,Quad PF,6+ Mos 07/23/2015, 08/08/2016   Moderna SARS-COV2 Booster Vaccination 02/11/2021, 02/20/2021   PFIZER(Purple Top)SARS-COV-2 Vaccination 01/01/2020, 01/22/2020, 08/11/2020   Pneumococcal Conjugate-13 03/13/2015   Pneumococcal Polysaccharide-23 05/22/2020   Td 11/08/2001   Tdap 08/17/2013    TDAP status: Up to date  Flu Vaccine status: Up to date  Pneumococcal vaccine status: Up to date  Covid-19 vaccine status: Completed vaccines  Qualifies for Shingles Vaccine? Yes   Zostavax completed No   Shingrix Completed?: No.    Education has been provided regarding the importance of this vaccine. Patient has been advised to call insurance company to determine out of pocket expense if they have not yet received this vaccine. Advised may also receive vaccine at local pharmacy or Health Dept. Verbalized acceptance and understanding.  Screening Tests Health Maintenance  Topic Date Due   Zoster Vaccines- Shingrix (1 of 2) Never done   HEMOGLOBIN A1C   05/26/2021   INFLUENZA VACCINE  06/08/2021   OPHTHALMOLOGY EXAM  09/11/2021   FOOT EXAM  11/26/2021   TETANUS/TDAP  08/18/2023   DEXA SCAN  Completed   COVID-19 Vaccine  Completed   Hepatitis C Screening  Completed   PNA vac Low Risk Adult  Completed   HPV VACCINES  Aged Out    Health Maintenance  Health Maintenance Due  Topic Date Due   Zoster Vaccines- Shingrix (1 of 2) Never done   HEMOGLOBIN A1C  05/26/2021   INFLUENZA VACCINE  06/08/2021    Colorectal cancer screening: No longer required.   Mammogram status: Completed Bilateral 12/31/2020. Repeat every year  Bone Density status: Ordered today. Pt provided with contact info and advised to call to schedule appt.  Lung Cancer Screening: (Low Dose CT Chest recommended if Age 32-80 years, 30 pack-year currently smoking OR have quit w/in 15years.) does not qualify.    Additional Screening:  Hepatitis C Screening: Completed 05/05/2016  Vision Screening: Recommended annual ophthalmology exams for early detection of glaucoma and other disorders of the eye. Is the patient up to date with their annual eye exam?  Yes  Who is the provider or  what is the name of the office in which the   Dental Screening: Recommended annual dental exams for proper oral hygiene  Community Resource Referral / Chronic Care Management: CRR required this visit?  No   CCM required this visit?  No      Plan:     I have personally reviewed and noted the following in the patient's chart:   Medical and social history Use of alcohol, tobacco or illicit drugs  Current medications and supplements including opioid prescriptions.  Functional ability and status Nutritional status Physical activity Advanced directives List of other physicians Hospitalizations, surgeries, and ER visits in previous 12 months Vitals Screenings to include cognitive, depression, and falls Referrals and appointments  In addition, I have reviewed and discussed with  patient certain preventive protocols, quality metrics, and best practice recommendations. A written personalized care plan for preventive services as well as general preventive health recommendations were provided to patient.   Due to this being a telephonic visit, the after visit summary with patients personalized plan was offered to patient via mail or my-chart. Patient would like to access on my-chart.   Roanna Raider, LPN   5/0/2774  Nurse Health Advisor  Nurse Notes: Patient states she has had a cough since last Thursday. Home Covid test negative. Offered to make an appt for patient but she declined. She has an appt to see her pulmonologist next Tuesday. She also c/o SOB on exertion which has been going on for a while. She admits that she does not take her Lasix as prescribed. Advised patient to take her Lasix as prescribed which she says she will start doing. Advised patient & daughter to go to the ER if patient's SOB worsens. They both voiced understanding.   Medical screening examination/treatment/procedure(s) were performed by non-physician practitioner and as supervising provider I was immediately available for consultation/collaboration.  I agree with above. Olive Bass, FNP

## 2021-06-16 ENCOUNTER — Other Ambulatory Visit: Payer: Self-pay

## 2021-06-16 ENCOUNTER — Encounter: Payer: Self-pay | Admitting: Pulmonary Disease

## 2021-06-16 ENCOUNTER — Ambulatory Visit (INDEPENDENT_AMBULATORY_CARE_PROVIDER_SITE_OTHER): Payer: Medicare Other | Admitting: Pulmonary Disease

## 2021-06-16 VITALS — BP 124/84 | HR 84 | Temp 97.8°F | Ht 60.0 in | Wt 285.0 lb

## 2021-06-16 DIAGNOSIS — R0602 Shortness of breath: Secondary | ICD-10-CM

## 2021-06-16 DIAGNOSIS — I5189 Other ill-defined heart diseases: Secondary | ICD-10-CM

## 2021-06-16 MED ORDER — BREZTRI AEROSPHERE 160-9-4.8 MCG/ACT IN AERO: 2.0000 | INHALATION_SPRAY | Freq: Two times a day (BID) | RESPIRATORY_TRACT | 0 refills | Status: DC

## 2021-06-16 MED ORDER — BUDESONIDE-FORMOTEROL FUMARATE 160-4.5 MCG/ACT IN AERO: 2.0000 | INHALATION_SPRAY | Freq: Two times a day (BID) | RESPIRATORY_TRACT | 12 refills | Status: DC

## 2021-06-16 NOTE — Patient Instructions (Signed)
Nice to meet you  Use symbicort 2 puffs twice a day - rinse mouth after every use  Use the breztri samples 2 puffs twice a day (rinse mouth after every use) while we sort out the Symbicort- similar medications. Let us know if you have similar side effects to the previous inhaler  and stop immediately if so.   Walk indoors 5 minutes twice a day for 2 weeks. Then increase by 3-5 minutes twice a day every 2 weeks or so.  Follow up with Dr. Judeth Horn in 3 months.

## 2021-07-20 ENCOUNTER — Ambulatory Visit (HOSPITAL_BASED_OUTPATIENT_CLINIC_OR_DEPARTMENT_OTHER)
Admission: RE | Admit: 2021-07-20 | Discharge: 2021-07-20 | Disposition: A | Discharge status: Home / Self Care | Payer: Medicare Other | Source: Ambulatory Visit | Attending: Family Medicine | Admitting: Family Medicine

## 2021-07-20 ENCOUNTER — Other Ambulatory Visit: Payer: Self-pay

## 2021-07-20 ENCOUNTER — Telehealth: Payer: Self-pay | Admitting: Family Medicine

## 2021-07-20 DIAGNOSIS — Z780 Asymptomatic menopausal state: Secondary | ICD-10-CM | POA: Insufficient documentation

## 2021-07-20 NOTE — Telephone Encounter (Signed)
Form dropped off for copland to fill out  Placed in bin up front  Call [phone] when ready

## 2021-07-21 ENCOUNTER — Encounter: Payer: Self-pay | Admitting: Family Medicine

## 2021-08-04 NOTE — Progress Notes (Signed)
@Patient  ID: , female    DOB: [birth year], 75 y.o.   MRN: [id number]  Chief Complaint  Patient presents with   Consult    SOB and coughing, mucous production was green/ now clear, wheezing often    Referring provider: Copland, [id number], MD  HPI:   75 y.o. woman whom we are seeing in consultation for evaluation of dyspnea on exertion.  Most recent PCP note reviewed.  Most recent cardiology note reviewed.  Most recent endocrinology note reviewed.  Patient with longstanding history of asthma.  At 1 point time was on fluticasone and salmeterol.  She had adverse events of this, like something stuck in the back of her throat, globus.  This was discontinued.  She struggled dyspnea exertion worsening over the last several months.  Worse with inclines or stairs.  Present on flat surfaces as well.  No time of day when things are better or worse.  No position where things are better or worse.  No seasonal environmental factors make things better or worse.  She has OSA and reports good adherence to CPAP.  She admits to weight gain as well over the last couple of years.  She has been trialed on diuretics per her cardiologist and this does temporarily help her dyspnea.  Most recent chest x-ray 03/04/2021 reviewed interpreted as poor inspiration, linear atelectasis in the bases, otherwise clear.  Most recent TTE 04/2021 reviewed which demonstrates grade 1 diastolic dysfunction, elevated left atrial pressure, trivial mitral regurg, normal LA size, normal RV size and function.  PMH: Diabetes, headaches, asthma Surgical history: Appendectomy, cholecystectomy, rotator cuff surgery Family history: Father with CAD, stomach cancer, sister with breast cancer Social history: She is a never smoker, lives in [place] / Pulmonary Flowsheets:   ACT:  No flowsheet data found.  MMRC: No flowsheet data found.  Epworth:  No flowsheet data found.  Tests:   FENO:  Lab Results   Component Value Date   NITRICOXIDE 18 12/22/2016    PFT: No flowsheet data found.  WALK:  SIX MIN WALK 12/22/2016  Tech Comments: Pt walked at a steady pace but comlpained of leg pain during walk, 2 laps TA/CMA    Imaging: Personally reviewed as per EMR discussion of this note  Lab Results: Personally reviewed CBC    Component Value Date/Time   WBC 8.5 11/26/2020 1516   RBC 4.66 11/26/2020 1516   HGB 14.3 11/26/2020 1516   HCT 42.8 11/26/2020 1516   PLT 207.0 11/26/2020 1516   MCV 92.0 11/26/2020 1516   MCH 30.6 11/09/2017 1447   MCHC 33.3 11/26/2020 1516   RDW 13.8 11/26/2020 1516   LYMPHSABS 2.5 11/09/2017 1447   MONOABS 1.0 11/09/2017 1447   EOSABS 0.3 11/09/2017 1447   BASOSABS 0.0 11/09/2017 1447    BMET    Component Value Date/Time   NA 139 03/04/2021 1141   NA 141 01/21/2016 0000   K 4.3 03/04/2021 1141   CL 103 03/04/2021 1141   CO2 28 03/04/2021 1141   GLUCOSE 150 (H) 03/04/2021 1141   BUN 13 03/04/2021 1141   BUN 16 01/21/2016 0000   CREATININE 0.75 03/04/2021 1141   CALCIUM 9.3 03/04/2021 1141   GFRNONAA >60 11/09/2017 1447   GFRAA >60 11/09/2017 1447    BNP No results found for: BNP  ProBNP    Component Value Date/Time   PROBNP 50.0 03/04/2021 1141    Specialty Problems       Pulmonary Problems  ALLERGIC RHINITIS    Qualifier: Diagnosis of  By: Alwyn Ren MD, [name]        OSA on CPAP   SOB (shortness of breath)    Allergies  Allergen Reactions   Mushroom Ext Cmplx(Shiitake-Reishi-Mait) Anaphylaxis   Shellfish Allergy Anaphylaxis   Sulfonamide Derivatives Anaphylaxis   Vancomycin Anaphylaxis   Fluticasone-Salmeterol Other (See Comments)    Feels like something in the throat   Butalbital-Aspirin-Caffeine Other (See Comments)    UNSPECIFIED REACTION    Montelukast Sodium Other (See Comments)    Feels like she's running out of breath   Penicillins     UNSPECIFIED REACTION      Childhood allergy Has patient had a PCN  reaction causing immediate rash, facial/tongue/throat swelling, SOB or lightheadedness with hypotension: Unknown Has patient had a PCN reaction causing severe rash involving mucus membranes or skin necrosis: Unknown Has patient had a PCN reaction that required hospitalization: No Has patient had a PCN reaction occurring within the last 10 years: No If all of the above answers are "NO", then may proceed with Cephalosporin use.     Acetaminophen Other (See Comments)    Stomach discomfort   Clarithromycin Nausea And Vomiting   Iodinated Diagnostic Agents Other (See Comments)    Bad headache; no prep needed   Levofloxacin Diarrhea and Nausea And Vomiting    Dizziness Believes it was due to a high dosage of this medication   Orphenadrine Nausea And Vomiting    Immunization History  Administered Date(s) Administered   Fluad Quad(high Dose 77+) 08/05/2020   Influenza Split 07/25/2012   Influenza Whole 09/27/2007, 08/21/2008, 09/17/2009   Influenza, High Dose Seasonal PF 09/05/2018   Influenza,inj,Quad PF,6+ Mos 07/23/2015, 08/08/2016   Moderna SARS-COV2 Booster Vaccination 02/11/2021, 02/20/2021   PFIZER(Purple Top)SARS-COV-2 Vaccination 01/01/2020, 01/22/2020, 08/11/2020   Pneumococcal Conjugate-13 03/13/2015   Pneumococcal Polysaccharide-23 05/22/2020   Td 11/08/2001   Tdap 08/17/2013    Past Medical History:  Diagnosis Date   Adjustment disorder with mixed anxiety and depressed mood 03/14/2015   Dr Madaline Guthrie actively treating [place]    ALLERGIC RHINITIS 04/04/2009   Qualifier: Diagnosis of  By: Alwyn Ren MD, Chrissie Noa     Allergy    Anxiety    Dr Madaline Guthrie   Asthma    ASTHMA 04/04/2009   Qualifier: Diagnosis of  By: Alwyn Ren MD, [name]   Status Asthmaticus 10/2004     Atypical seizure (HCC) 06/07/2012   BENIGN POSITIONAL VERTIGO 11/06/2009   Qualifier: Diagnosis of  By: Linford Arnold MD, Catherine     Depression    Dr Madaline Guthrie   Diabetes mellitus    Disturbances of sensation of smell and  taste 05/22/2013   Diverticulosis of colon 04/04/2009   Qualifier: Diagnosis of  By: Alwyn Ren MD, [name]     Dizziness and giddiness 12/22/2016   DVT (deep venous thrombosis) (HCC)    Headache(784.0)    Hyperlipidemia    Hypersomnia with sleep apnea, unspecified 05/22/2014   Injury of left knee 08/22/2013   [place]15[place]13 [place] contusion and abrasion of left knee. 08/17/13 seen in the emergency room; imaging negative. No labs. Referred to Dr. Pearletha Forge who recommended nonsteroidals and elevation. 10/18-11/3/14 in [place] for land and river cruise trip.  08/29/13 seen in ER in [place]; topical antibiotics & oral Ciprobay Rxed. 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 Xarelto 15 mg initiated 06/05/12 by Dr Jeraldine Loots , [facility] and increased to one pill twice a day. Despite this she had persistent thrombosis in the cephalic vein in the elbow and the upper forearm areas prompting change to Lovenox 110 mg e   Pulmonary embolism (HCC) 07/09/2012   Presumed based on intermediate VQ 07/07/2012 and high clinical probability.  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 12/17/12 Note: I recommended discussion with [name],Neurologist; & Dr Margaretmary Bayley, Diabetologist    Type 2 diabetes, controlled, with peripheral neuropathy (HCC) 04/04/2009   Qualifier: Diagnosis of  By: Alwyn Ren MD, Chrissie Noa   Dr Margaretmary Bayley, Endo Dr Emily Filbert , Ophth seen anually     Variants of migraine, not elsewhere classified, without mention of intractable migraine without mention of status migrainosus 05/22/2013    Tobacco History: Social History   Tobacco Use  Smoking Status Never  Smokeless Tobacco Never   Counseling given: Yes   Continue to not smoke  Outpatient Encounter Medications as of 06/16/2021  Medication Sig   albuterol (VENTOLIN HFA) 108 (90 Base) MCG/ACT inhaler Inhale 2 puffs into the lungs every 6 (six) hours as needed for wheezing or shortness of breath.   Bacillus Coagulans-Inulin (PROBIOTIC) 1-250 BILLION-MG CAPS Take 1 tablet by mouth daily at 6 (six) AM.   Budeson-Glycopyrrol-Formoterol (BREZTRI AEROSPHERE) 160-9-4.8 MCG/ACT AERO Inhale 2 puffs into the lungs in the morning and at bedtime.   budesonide-formoterol (SYMBICORT) 160-4.5 MCG/ACT inhaler Inhale 2 puffs into the lungs 2 (two) times daily.   Cholecalciferol (VITAMIN D) 50 MCG (2000 UT) CAPS Take 1 capsule by mouth daily at 6 (six) AM.   FLUoxetine (PROZAC) 20 MG capsule Take 1 capsule (20 mg total) by mouth daily.   furosemide (LASIX) 20 MG tablet Take 1 tablet (20 mg total) by mouth 3 (three) times a week. Take once on Tuesday, Thursday and Saturday.   gabapentin (NEURONTIN) 100 MG capsule Take 1 capsule (100 mg total) by mouth 3 (three) times daily. (Patient taking differently: Take 100 mg by mouth daily.)   glipiZIDE (GLUCOTROL XL) 2.5 MG 24 hr tablet TAKE 1 TABLET BY MOUTH WITH BREAKFAST   guaiFENesin (MUCINEX) 600 MG 12 hr tablet Take by mouth 2 (two) times daily.   loratadine (CLARITIN) 10 MG tablet Take 10 mg by mouth daily.    potassium chloride (KLOR-CON) 10 MEQ tablet Take 10 mEq by mouth 3 (three) times a week. Take on days you use Lasix   rizatriptan (MAXALT) 10 MG tablet Take 1 tablet (10 mg total) by mouth as needed for migraine. May repeat in 2 hours if needed   rosuvastatin (CRESTOR) 10 MG tablet Take 1 tablet (  10 mg total) by mouth daily.   No facility-administered encounter medications on file as of 06/16/2021.     Review of Systems  Review of Systems  No chest with exertion.  No orthopnea or PND.  Comprehensive review of systems otherwise negative. Physical Exam  BP 124/84 (BP Location: Right Wrist, Cuff Size: Normal)   Pulse 84   Temp 97.8 F (36.6 C) (Oral)   Ht 5' (1.524 m)   Wt 285 lb (129.3 kg)   SpO2 95%   BMI 55.66 kg/m   Wt Readings from Last 5 Encounters:  06/16/21 285 lb (129.3 kg)  06/09/21 289 lb (131.1 kg)  05/19/21 289 lb 6.4 oz (131.3 kg)  05/12/21 290 lb (131.5 kg)  03/26/21 286 lb (129.7 kg)    BMI Readings from Last 5 Encounters:  06/16/21 55.66 kg/m  06/09/21 56.44 kg/m  05/19/21 56.52 kg/m  05/12/21 56.64 kg/m  04/27/21 55.86 kg/m     Physical Exam General: Sitting in chair, no acute distress Eyes: EOMI, no icterus Neck: Supple, no JVP Pulmonary: Clear, distant, normal work of breathing Cardiovascular regular rate and rhythm, no murmur Abdomen: Ostomy, bowel sounds present MSK: No synovitis, joint effusion Neuro: Normal gait, no weakness Psych: Normal mood, full affect   Assessment & Plan:   Dyspnea on exertion: Appears multifactorial.  Contribution of diastolic dysfunction, elevated left atrial pressure on recent TTE.  Some improvement with Lasix.  In addition, suspect obesity contributing.  Also high suspicion for deconditioning given gradually decreased activity over time.  Finally, worry about poorly controlled asthma.  Recommended pulm rehab versus gradually exercise program at home.  She would like to trial a graduated exercise program at  home.  Asthma: Concern for poor control.  Intolerance to Advair.  Unclear what may have contributed this.  We will avoid fluticasone salmeterol containing products for now.  Prescribed Symbicort high-dose 2 puffs twice a day.  Samples of Breztri in the meantime.   Return in about 3 months (around 09/16/2021).   Karren Burly, MD 08/04/2021

## 2021-09-06 NOTE — Progress Notes (Addendum)
[facility] [address], [address] [place], [place] [address] [phone]  Date:  09/09/2021   Name:  Nicole Mccann   DOB:  01-16-1946   MRN:  [id number]  PCP:  Pearline Cables, MD    Chief Complaint: Annual Exam (Concerns/ questions: skin tag Left thigh/Flu shot today: received Wednesday 09/02/21/)   History of Present Illness:  Nicole Mccann is a 75 y.o. very pleasant female patient who presents with the following:  Pt seen today for a CPE Last visit with myself was in June History of controlled diabetes with polyneuropathy, obesity, hyperlipidemia, seizure disorder, sleep apnea on CPAP, venous insufficiency with swelling of bilateral lower extremities She also sees endocrinology for diabetes care, Dr. Elvera Lennox Taking fluoxetine 20 for depression which does help   She has noted some sinus congestion recently and had to use her albuterol and symbicort  No fever or chills She notes some post- nasal drainage  She is seeing her lung doctor tomorrow  No history of glaucoma.  She would be interested in trying Atrovent nasal  Shingrix Covid booster- done  Flu shot -done already  Mammo UTD Dexa UTD Colon ???? -Patient has not been screened for colon cancer, but declined screening due to her age She is fasting today for labs   Large skin tag on her left thigh -this has been present for several years, it is annoying to the patient.  She would like to have it removed today if possible  Lab Results  Component Value Date   HGBA1C 6.5 11/26/2020     Patient Active Problem List   Diagnosis Date Noted   SOB (shortness of breath) 05/12/2021   Diastolic dysfunction 05/12/2021   Chronic diastolic heart failure (HCC) 05/12/2021   Morbid obesity (HCC) 05/12/2021   PSVT (paroxysmal supraventricular tachycardia) (HCC) 05/12/2021   Hypertriglyceridemia 05/12/2021   Diabetes mellitus due to underlying condition with  hyperosmolarity without coma, without long-term current use of insulin (HCC) 05/12/2021   Olfactory aura    Low blood pressure    Hyperlipidemia    Depression    Anxiety    Allergy    Bilateral headaches 12/09/2020   Mixed hyperlipidemia 07/10/2019   Dizziness and giddiness 12/22/2016   Osteopenia 09/21/2016   Low back pain 05/26/2016   Right shoulder pain 04/04/2015   Adjustment disorder with mixed anxiety and depressed mood 03/14/2015   Rotator cuff (capsule) sprain 03/13/2015   Pseudoptosis 11/18/2014   Seizure disorder, temporal lobe, intractable (HCC) 10/17/2014   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/2014   Severe obesity (BMI >= 40) (HCC) 10/17/2014   OSA on CPAP 10/17/2014   Hypersomnia with sleep apnea, unspecified 05/22/2014   Injury of left knee 08/22/2013   Shingles 07/2013   Variants of migraine, not elsewhere classified, without mention of intractable migraine without mention of status migrainosus 05/22/2013   Disturbances of sensation of smell and taste 05/22/2013   Spinal stenosis of lumbar region 12/17/2012   Pulmonary embolism (HCC) 07/09/2012   DVT (deep venous thrombosis) (HCC) 07/07/2012   Atypical seizure (HCC) 06/07/2012   Phlebitis following infusion 06/05/2012   BENIGN POSITIONAL VERTIGO 11/06/2009   Type 2 diabetes, controlled, with peripheral neuropathy (HCC) 04/04/2009   ALLERGIC RHINITIS 04/04/2009   ASTHMA 04/04/2009   Diverticulosis of colon 04/04/2009    Past Medical History:  Diagnosis Date   Adjustment disorder with mixed anxiety and depressed mood 03/14/2015   Dr Madaline Guthrie actively treating  Rita    ALLERGIC RHINITIS 04/04/2009   Qualifier: Diagnosis of  By: Alwyn Ren MD, Chrissie Noa     Allergy    Anxiety    Dr Madaline Guthrie   Asthma    ASTHMA 04/04/2009   Qualifier: Diagnosis of  By: Alwyn Ren MD, [name]   Status Asthmaticus 10/2004     Atypical seizure (HCC) 06/07/2012   BENIGN POSITIONAL VERTIGO 11/06/2009   Qualifier: Diagnosis of  By: Linford Arnold MD,  Catherine     Depression    Dr Madaline Guthrie   Diabetes mellitus    Disturbances of sensation of smell and taste 05/22/2013   Diverticulosis of colon 04/04/2009   Qualifier: Diagnosis of  By: Alwyn Ren MD, Chrissie Noa     Dizziness and giddiness 12/22/2016   DVT (deep venous thrombosis) (HCC)    Headache(784.0)    Hyperlipidemia    Hypersomnia with sleep apnea, unspecified 05/22/2014   Injury of left knee 08/22/2013   08[place]15[place]13 mechanical [place] contusion and abrasion of left knee. 08/17/13 seen in the emergency room; imaging negative. No labs. Referred to Dr. Pearletha Forge who recommended nonsteroidals and elevation. 10/18-11/3/14 in [place] for land and river cruise trip.  08/29/13 seen in ER in [place]; topical antibiotics & oral Ciprobay Rxed. 10/23 at second ER I&D declined ; wound   Low back pain 05/26/2016   Low blood pressure    Mixed hyperlipidemia 07/10/2019   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/2014   Olfactory aura    OSA on CPAP    Osteopenia 09/21/2016   dexa 09/2016   Phlebitis following infusion 06/05/2012   Onset 06/01/12;S/P Dilantin infusion 05/30/12 @ [facility] for atypical seizure activity with response 06/05/12 DVT of Cephalic vein elbow- mid upper arm. Xarelto 15 mg initiated 06/05/12 by Dr Jeraldine Loots , [facility] and increased to one pill twice a day. Despite this she had persistent thrombosis in the cephalic vein in the elbow and the upper forearm areas prompting change to Lovenox 110 mg e   Pulmonary embolism (HCC) 07/09/2012   Presumed based on intermediate VQ 07/07/2012 and high clinical probability.  Definitive CT angiogram was not possible due to contrast  intolerance/allergy    Right shoulder pain 04/04/2015   Rotator cuff (capsule) sprain 03/13/2015   Seizure disorder, temporal lobe, intractable (HCC) 10/17/2014   Severe obesity (BMI >= 40) (HCC) 10/17/2014   Shingles 07/2013   Spinal stenosis of lumbar region 12/17/2012   S/P suboptimal response to  Chiropractry ;Dr Kai Levins referred her  to Dr Sher[facility] [phone]; FAX [phone] MRI 12/15/12 : stenosis @ L3-4; L 4-5  in context of DDD ESI recommended . 12/17/12 Note: I recommended discussion with [name],Neurologist; & Dr Margaretmary Bayley, Diabetologist    Type 2 diabetes, controlled, with peripheral neuropathy (HCC) 04/04/2009   Qualifier: Diagnosis of  By: Alwyn Ren MD, Chrissie Noa   Dr Margaretmary Bayley, Endo Dr Emily Filbert , Ophth seen anually     Variants of migraine, not elsewhere classified, without mention of intractable migraine without mention of status migrainosus 05/22/2013    Past Surgical History:  Procedure Laterality Date   APPENDECTOMY     ARTERY BIOPSY Left 04/14/2018   Procedure: BIOPSY TEMPORAL ARTERY;  Surgeon: Sherren Kerns, MD;  Location: [facility] OR;  Service: Vascular;  Laterality: Left;   BREAST EXCISIONAL BIOPSY Right 1998   CHOLECYSTECTOMY     COLONOSCOPY  2006   [facility]   rotator cuff surgery     TONSILLECTOMY  Social History   Tobacco Use   Smoking status: Never   Smokeless tobacco: Never  Substance Use Topics   Alcohol use: Yes    Alcohol/week: 3.0 standard drinks    Types: 3 Glasses of wine per week    Comment: 1/2 glass wine daily   Drug use: No    Family History  Problem Relation Age of Onset   Stomach cancer Father    Coronary artery disease Father    Breast cancer Sister 89       breast cancer   Diabetes Maternal Aunt    Diabetes Paternal Aunt    Prostate cancer Brother    Cancer Daughter    Stroke Neg Hx     Allergies  Allergen Reactions   Mushroom Ext Cmplx(Shiitake-Reishi-Mait) Anaphylaxis   Shellfish Allergy Anaphylaxis   Sulfonamide Derivatives Anaphylaxis   Vancomycin Anaphylaxis   Fluticasone-Salmeterol Other (See Comments)    Feels like something in the throat   Butalbital-Aspirin-Caffeine Other (See Comments)    UNSPECIFIED REACTION    Montelukast Sodium Other (See Comments)    Feels like she's running  out of breath   Penicillins     UNSPECIFIED REACTION      Childhood allergy Has patient had a PCN reaction causing immediate rash, facial/tongue/throat swelling, SOB or lightheadedness with hypotension: Unknown Has patient had a PCN reaction causing severe rash involving mucus membranes or skin necrosis: Unknown Has patient had a PCN reaction that required hospitalization: No Has patient had a PCN reaction occurring within the last 10 years: No If all of the above answers are "NO", then may proceed with Cephalosporin use.     Acetaminophen Other (See Comments)    Stomach discomfort   Clarithromycin Nausea And Vomiting   Iodinated Diagnostic Agents Other (See Comments)    Bad headache; no prep needed   Levofloxacin Diarrhea and Nausea And Vomiting    Dizziness Believes it was due to a high dosage of this medication   Orphenadrine Nausea And Vomiting    Medication list has been reviewed and updated.  Current Outpatient Medications on File Prior to Visit  Medication Sig Dispense Refill   albuterol (VENTOLIN HFA) 108 (90 Base) MCG/ACT inhaler Inhale 2 puffs into the lungs every 6 (six) hours as needed for wheezing or shortness of breath. 18 g 6   Bacillus Coagulans-Inulin (PROBIOTIC) 1-250 BILLION-MG CAPS Take 1 tablet by mouth daily at 6 (six) AM.     Budeson-Glycopyrrol-Formoterol (BREZTRI AEROSPHERE) 160-9-4.8 MCG/ACT AERO Inhale 2 puffs into the lungs in the morning and at bedtime. 4.8 g 0   budesonide-formoterol (SYMBICORT) 160-4.5 MCG/ACT inhaler Inhale 2 puffs into the lungs 2 (two) times daily. 1 each 12   Cholecalciferol (VITAMIN D) 50 MCG (2000 UT) CAPS Take 1 capsule by mouth daily at 6 (six) AM.     FLUoxetine (PROZAC) 20 MG capsule Take 1 capsule (20 mg total) by mouth daily. 90 capsule 3   furosemide (LASIX) 20 MG tablet Take 1 tablet (20 mg total) by mouth 3 (three) times a week. Take once on Tuesday, Thursday and Saturday. 52 tablet 2   gabapentin (NEURONTIN) 100 MG  capsule Take 1 capsule (100 mg total) by mouth 3 (three) times daily. (Patient taking differently: Take 100 mg by mouth daily.) 270 capsule 1   glipiZIDE (GLUCOTROL XL) 2.5 MG 24 hr tablet TAKE 1 TABLET BY MOUTH WITH BREAKFAST 90 tablet 2   loratadine (CLARITIN) 10 MG tablet Take 10 mg by mouth daily.  potassium chloride (KLOR-CON) 10 MEQ tablet Take 10 mEq by mouth 3 (three) times a week. Take on days you use Lasix     rizatriptan (MAXALT) 10 MG tablet Take 1 tablet (10 mg total) by mouth as needed for migraine. May repeat in 2 hours if needed 12 tablet 3   rosuvastatin (CRESTOR) 10 MG tablet Take 1 tablet (10 mg total) by mouth daily. 90 tablet 3   No current facility-administered medications on file prior to visit.    Review of Systems:  As per HPI- otherwise negative.   Physical Examination: Vitals:   09/09/21 1104  BP: 126/80  Pulse: 73  Resp: 20  Temp: 97.9 F (36.6 C)  SpO2: 98%   Vitals:   09/09/21 1104  Weight: 284 lb 12.8 oz (129.2 kg)  Height: 5' (1.524 m)   Body mass index is 55.62 kg/m. Ideal Body Weight: Weight in (lb) to have BMI = 25: 127.7  GEN: no acute distress.  Obese, appears her normal self, accompanied by her daughter and in wheelchair HEENT: Atraumatic, Normocephalic.  Ears and Nose: No external deformity. CV: RRR, No M/G/R. No JVD. No thrill. No extra heart sounds. PULM: CTA B, no wheezes, crackles, rhonchi. No retractions. No resp. distress. No accessory muscle use. ABD: S, NT, ND EXTR: No c/c/e.  No significant edema today, chronic dermatitis is present PSYCH: Normally interactive. Conversant.  Patient has a skin tag on her left thigh, pedunculated on a stalk.  She would like to have this removed.  Discussed potential risks including infection and bleeding, she would like to proceed  Prep skin tag with Betadine.  Used approximately 2 cc of 1% lidocaine at the base of skin tag for anesthesia.  Removed with a derma blade.  Silver nitrate  sticks used to obtain hemostasis, checked that we achieved hemostasis prior to patient leaving the office.  Dressed with Band-Aid  Assessment and Plan: Physical exam  Type 2 diabetes, controlled, with peripheral neuropathy (HCC) - Plan: Comprehensive metabolic panel, Hemoglobin A1c  Venous stasis dermatitis of both lower extremities - Plan: CBC  Other migraine with status migrainosus, not intractable  Mild intermittent asthma, unspecified whether complicated  Mixed hyperlipidemia - Plan: Lipid panel  Fatigue, unspecified type - Plan: TSH, VITAMIN D 25 Hydroxy (Vit-D Deficiency, Fractures)  PND (post-nasal drip) - Plan: ipratropium (ATROVENT) 0.03 % nasal spray  Skin tag  Patient seen today for physical exam.  Labs are pending as above, she is seeing her pulmonologist tomorrow. We will have her try Atrovent nasal as needed for postnasal drainage Removed skin tag, provided written care instructions on after visit summary  Signed Abbe Amsterdam, MD  Received her labs as below, message to patient  Results for orders placed or performed in visit on 09/09/21  CBC  Result Value Ref Range   WBC 7.8 4.0 - 10.5 K/uL   RBC 4.63 3.87 - 5.11 Mil/uL   Platelets 199.0 150.0 - 400.0 K/uL   Hemoglobin 13.9 12.0 - 15.0 g/dL   HCT 25.3 66.4 - 40.3 %   MCV 92.0 78.0 - 100.0 fl   MCHC 32.6 30.0 - 36.0 g/dL   RDW 47.4 25.9 - 56.3 %  Comprehensive metabolic panel  Result Value Ref Range   Sodium 139 135 - 145 mEq/L   Potassium 4.3 3.5 - 5.1 mEq/L   Chloride 101 96 - 112 mEq/L   CO2 32 19 - 32 mEq/L   Glucose, Bld 132 (H) 70 - 99 mg/dL  BUN 14 6 - 23 mg/dL   Creatinine, Ser 6.64 0.40 - 1.20 mg/dL   Total Bilirubin 0.5 0.2 - 1.2 mg/dL   Alkaline Phosphatase 54 39 - 117 U/L   AST 13 0 - 37 U/L   ALT 16 0 - 35 U/L   Total Protein 6.3 6.0 - 8.3 g/dL   Albumin 4.1 3.5 - 5.2 g/dL   GFR 40.34 >74.25 mL/min   Calcium 9.1 8.4 - 10.5 mg/dL  Hemoglobin Z5G  Result Value Ref Range   Hgb A1c  MFr Bld 6.8 (H) 4.6 - 6.5 %  Lipid panel  Result Value Ref Range   Cholesterol 178 0 - 200 mg/dL   Triglycerides 387.5 0.0 - 149.0 mg/dL   HDL 64.33 >29.51 mg/dL   VLDL 88.4 0.0 - 16.6 mg/dL   LDL Cholesterol 97 0 - 99 mg/dL   Total CHOL/HDL Ratio 3    NonHDL 123.65   TSH  Result Value Ref Range   TSH 4.31 0.35 - 5.50 uIU/mL  VITAMIN D 25 Hydroxy (Vit-D Deficiency, Fractures)  Result Value Ref Range   VITD 29.35 (L) 30.00 - 100.00 ng/mL

## 2021-09-06 NOTE — Patient Instructions (Addendum)
Good to see you again today- I will be in touch with your labs Assuming all is well please see me in 6 months   Try the atrovent nasal as needed for post nasal discharge and drainage- if too expensive however you do not have to use it  Skin tag removed from your left thigh today Keep band- aid in place today- tomorrow you can remove it and bathe as usual.  If any sign of infection such as redness, swelling, heat or increasing tenderness please contact me right away If bleeding apply firm pressure for about 15 minutes.  If this fails to get bleeding under control seek care!

## 2021-09-08 ENCOUNTER — Other Ambulatory Visit: Payer: Self-pay

## 2021-09-09 ENCOUNTER — Ambulatory Visit (INDEPENDENT_AMBULATORY_CARE_PROVIDER_SITE_OTHER): Payer: Medicare Other | Admitting: Family Medicine

## 2021-09-09 ENCOUNTER — Encounter: Payer: Self-pay | Admitting: Family Medicine

## 2021-09-09 VITALS — BP 126/80 | HR 73 | Temp 97.9°F | Resp 20 | Ht 60.0 in | Wt 284.8 lb

## 2021-09-09 DIAGNOSIS — E782 Mixed hyperlipidemia: Secondary | ICD-10-CM | POA: Diagnosis not present

## 2021-09-09 DIAGNOSIS — Z0000 Encounter for general adult medical examination without abnormal findings: Secondary | ICD-10-CM | POA: Diagnosis not present

## 2021-09-09 DIAGNOSIS — R5383 Other fatigue: Secondary | ICD-10-CM | POA: Diagnosis not present

## 2021-09-09 DIAGNOSIS — L918 Other hypertrophic disorders of the skin: Secondary | ICD-10-CM

## 2021-09-09 DIAGNOSIS — E1142 Type 2 diabetes mellitus with diabetic polyneuropathy: Secondary | ICD-10-CM

## 2021-09-09 DIAGNOSIS — I872 Venous insufficiency (chronic) (peripheral): Secondary | ICD-10-CM

## 2021-09-09 DIAGNOSIS — J4520 Mild intermittent asthma, uncomplicated: Secondary | ICD-10-CM

## 2021-09-09 DIAGNOSIS — G43801 Other migraine, not intractable, with status migrainosus: Secondary | ICD-10-CM | POA: Diagnosis not present

## 2021-09-09 DIAGNOSIS — R0982 Postnasal drip: Secondary | ICD-10-CM

## 2021-09-09 LAB — COMPREHENSIVE METABOLIC PANEL
ALT: 16 U/L (ref 0–35)
AST: 13 U/L (ref 0–37)
Albumin: 4.1 g/dL (ref 3.5–5.2)
Alkaline Phosphatase: 54 U/L (ref 39–117)
BUN: 14 mg/dL (ref 6–23)
CO2: 32 mEq/L (ref 19–32)
Calcium: 9.1 mg/dL (ref 8.4–10.5)
Chloride: 101 mEq/L (ref 96–112)
Creatinine, Ser: 0.82 mg/dL (ref 0.40–1.20)
GFR: 68.85 mL/min (ref >60.00)
Glucose, Bld: 132 mg/dL — ABNORMAL HIGH (ref 70–99)
Potassium: 4.3 mEq/L (ref 3.5–5.1)
Sodium: 139 mEq/L (ref 135–145)
Total Bilirubin: 0.5 mg/dL (ref 0.2–1.2)
Total Protein: 6.3 g/dL (ref 6.0–8.3)

## 2021-09-09 LAB — HEMOGLOBIN A1C: Hgb A1c MFr Bld: 6.8 % — ABNORMAL HIGH (ref 4.6–6.5)

## 2021-09-09 LAB — CBC
HCT: 42.6 % (ref 36.0–46.0)
Hemoglobin: 13.9 g/dL (ref 12.0–15.0)
MCHC: 32.6 g/dL (ref 30.0–36.0)
MCV: 92 fl (ref 78.0–100.0)
Platelets: 199 10*3/uL (ref 150.0–400.0)
RBC: 4.63 Mil/uL (ref 3.87–5.11)
RDW: 14 % (ref 11.5–15.5)
WBC: 7.8 10*3/uL (ref 4.0–10.5)

## 2021-09-09 LAB — LIPID PANEL
Cholesterol: 178 mg/dL (ref 0–200)
HDL: 53.9 mg/dL (ref >39.00)
LDL Cholesterol: 97 mg/dL (ref 0–99)
NonHDL: 123.65
Total CHOL/HDL Ratio: 3
Triglycerides: 134 mg/dL (ref 0.0–149.0)
VLDL: 26.8 mg/dL (ref 0.0–40.0)

## 2021-09-09 LAB — VITAMIN D 25 HYDROXY (VIT D DEFICIENCY, FRACTURES): VITD: 29.35 ng/mL — ABNORMAL LOW (ref 30.00–100.00)

## 2021-09-09 LAB — TSH: TSH: 4.31 u[IU]/mL (ref 0.35–5.50)

## 2021-09-09 MED ORDER — IPRATROPIUM BROMIDE 0.03 % NA SOLN: 2.0000 | Freq: Two times a day (BID) | NASAL | 12 refills | Status: DC

## 2021-09-10 ENCOUNTER — Encounter: Payer: Self-pay | Admitting: Pulmonary Disease

## 2021-09-10 ENCOUNTER — Ambulatory Visit (INDEPENDENT_AMBULATORY_CARE_PROVIDER_SITE_OTHER): Payer: Medicare Other | Admitting: Pulmonary Disease

## 2021-09-10 ENCOUNTER — Other Ambulatory Visit: Payer: Self-pay

## 2021-09-10 VITALS — BP 126/80 | HR 90 | Ht 60.0 in | Wt 295.0 lb

## 2021-09-10 DIAGNOSIS — J4520 Mild intermittent asthma, uncomplicated: Secondary | ICD-10-CM

## 2021-09-10 DIAGNOSIS — J309 Allergic rhinitis, unspecified: Secondary | ICD-10-CM | POA: Diagnosis not present

## 2021-09-10 DIAGNOSIS — R0602 Shortness of breath: Secondary | ICD-10-CM

## 2021-09-10 NOTE — Patient Instructions (Signed)
Nice to see you again  We will continue Symbicort 2 puffs twice a day.  Please rinse her mouth after every use.  We may need to renew assistance paperwork in the new year.  For the nasal congestion, increase the sinus rinses to twice a day for 1 week.  After you rinse your sinuses, use Flonase 1 spray each nostril twice a day for 1 week.  Then, if things are improving, you can go back to your sinus rinses once a day followed by Flonase 1 spray each nostril once a day.  Return to clinic in 6 months or sooner as needed with Dr. Judeth Horn

## 2021-09-11 NOTE — Progress Notes (Signed)
@Patient  ID: Nicole Mccann, female    DOB: 08-23-1946, 75 y.o.   MRN: [id number]  Chief Complaint  Patient presents with   Shortness of Breath    Reports dyspnea improved since prescribed new inhaler    Referring provider: CoplandGwenlyn Found, MD  HPI:   75 y.o. woman whom we are seeing in follow up for evaluation of dyspnea on exertion related to poorly controlled asthma. Symptoms improved with alteration in maintenance inhaler.  Symptoms of DOE much improved. She attributes this to change to [facility] at last visit. No concerns with breathing. Notes a few days of worsened sinus congestion, postnasal drip.This is a chronic issue but acutely worse. Endorses postnasal drip. Says has caused some mild chest tightness last couple of days. Using sinus rinse once daily.  HPI at initial visit: Patient with longstanding history of asthma.  At 1 point time was on fluticasone and salmeterol.  She had adverse events of this, like something stuck in the back of her throat, globus.  This was discontinued.  She struggled dyspnea exertion worsening over the last several months.  Worse with inclines or stairs.  Present on flat surfaces as well.  No time of day when things are better or worse.  No position where things are better or worse.  No seasonal environmental factors make things better or worse.  She has OSA and reports good adherence to CPAP.  She admits to weight gain as well over the last couple of years.  She has been trialed on diuretics per her cardiologist and this does temporarily help her dyspnea.  Most recent chest x-ray 03/04/2021 reviewed interpreted as poor inspiration, linear atelectasis in the bases, otherwise clear.  Most recent TTE 04/2021 reviewed which demonstrates grade 1 diastolic dysfunction, elevated left atrial pressure, trivial mitral regurg, normal LA size, normal RV size and function.  PMH: Diabetes, headaches, asthma Surgical history: Appendectomy, cholecystectomy, rotator  cuff surgery Family history: Father with CAD, stomach cancer, sister with breast cancer Social history: She is a never smoker, lives in [place] / Pulmonary Flowsheets:   ACT:  No flowsheet data found.  MMRC: No flowsheet data found.  Epworth:  No flowsheet data found.  Tests:   FENO:  Lab Results  Component Value Date   NITRICOXIDE 18 12/22/2016    PFT: No flowsheet data found.  WALK:  SIX MIN WALK 12/22/2016  Tech Comments: Pt walked at a steady pace but comlpained of leg pain during walk, 2 laps TA/CMA    Imaging: Personally reviewed as per EMR discussion of this note  Lab Results: Personally reviewed CBC    Component Value Date/Time   WBC 7.8 09/09/2021 1144   RBC 4.63 09/09/2021 1144   HGB 13.9 09/09/2021 1144   HCT 42.6 09/09/2021 1144   PLT 199.0 09/09/2021 1144   MCV 92.0 09/09/2021 1144   MCH 30.6 11/09/2017 1447   MCHC 32.6 09/09/2021 1144   RDW 14.0 09/09/2021 1144   LYMPHSABS 2.5 11/09/2017 1447   MONOABS 1.0 11/09/2017 1447   EOSABS 0.3 11/09/2017 1447   BASOSABS 0.0 11/09/2017 1447    BMET    Component Value Date/Time   NA 139 09/09/2021 1144   NA 141 01/21/2016 0000   K 4.3 09/09/2021 1144   CL 101 09/09/2021 1144   CO2 32 09/09/2021 1144   GLUCOSE 132 (H) 09/09/2021 1144   BUN 14 09/09/2021 1144   BUN 16 01/21/2016 0000   CREATININE 0.82 09/09/2021 1144  CALCIUM 9.1 09/09/2021 1144   GFRNONAA >60 11/09/2017 1447   GFRAA >60 11/09/2017 1447    BNP No results found for: BNP  ProBNP    Component Value Date/Time   PROBNP 50.0 03/04/2021 1141    Specialty Problems       Pulmonary Problems   ALLERGIC RHINITIS    Qualifier: Diagnosis of  By: Alwyn Ren MD, [name]        OSA on CPAP   SOB (shortness of breath)    Allergies  Allergen Reactions   Mushroom Ext Cmplx(Shiitake-Reishi-Mait) Anaphylaxis   Shellfish Allergy Anaphylaxis   Sulfonamide Derivatives Anaphylaxis   Vancomycin Anaphylaxis    Fluticasone-Salmeterol Other (See Comments)    Feels like something in the throat   Butalbital-Aspirin-Caffeine Other (See Comments)    UNSPECIFIED REACTION    Montelukast Sodium Other (See Comments)    Feels like she's running out of breath   Penicillins     UNSPECIFIED REACTION      Childhood allergy Has patient had a PCN reaction causing immediate rash, facial/tongue/throat swelling, SOB or lightheadedness with hypotension: Unknown Has patient had a PCN reaction causing severe rash involving mucus membranes or skin necrosis: Unknown Has patient had a PCN reaction that required hospitalization: No Has patient had a PCN reaction occurring within the last 10 years: No If all of the above answers are "NO", then may proceed with Cephalosporin use.     Acetaminophen Other (See Comments)    Stomach discomfort   Clarithromycin Nausea And Vomiting   Iodinated Diagnostic Agents Other (See Comments)    Bad headache; no prep needed   Levofloxacin Diarrhea and Nausea And Vomiting    Dizziness Believes it was due to a high dosage of this medication   Orphenadrine Nausea And Vomiting    Immunization History  Administered Date(s) Administered   Fluad Quad(high Dose 65+) 08/05/2020   Influenza Split 07/25/2012   Influenza Whole 09/27/2007, 08/21/2008, 09/17/2009   Influenza, High Dose Seasonal PF 09/05/2018   Influenza,inj,Quad PF,6+ Mos 07/23/2015, 08/08/2016   Influenza-Unspecified 09/02/2021   Moderna SARS-COV2 Booster Vaccination 02/11/2021, 02/20/2021   PFIZER(Purple Top)SARS-COV-2 Vaccination 01/01/2020, 01/22/2020, 08/11/2020   Pneumococcal Conjugate-13 03/13/2015   Pneumococcal Polysaccharide-23 05/22/2020   Td 11/08/2001   Tdap 08/17/2013    Past Medical History:  Diagnosis Date   Adjustment disorder with mixed anxiety and depressed mood 03/14/2015   Dr Madaline Guthrie actively treating [place]    ALLERGIC RHINITIS 04/04/2009   Qualifier: Diagnosis of  By: Alwyn Ren MD, Chrissie Noa     Allergy     Anxiety    Dr Madaline Guthrie   Asthma    ASTHMA 04/04/2009   Qualifier: Diagnosis of  By: Alwyn Ren MD, [name]   Status Asthmaticus 10/2004     Atypical seizure (HCC) 06/07/2012   BENIGN POSITIONAL VERTIGO 11/06/2009   Qualifier: Diagnosis of  By: Linford Arnold MD, Catherine     Depression    Dr Madaline Guthrie   Diabetes mellitus    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 08/17/13 seen in the emergency room; imaging negative. No labs. Referred  to Dr. [name] who recommended nonsteroidals and elevation. 10/18-11/3/14 in [place] for land and river cruise trip.  08/29/13 seen in ER in [place]; topical antibiotics & oral Ciprobay Rxed. 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 Xarelto 15 mg initiated 06/05/12 by Dr [name] , [place] and increased to one pill twice a day. Despite this she had persistent thrombosis in the cephalic vein in the elbow and the upper forearm areas prompting change to Lovenox 110 mg e   Pulmonary embolism ([place]) 07/09/2012   Presumed based on intermediate VQ 07/07/2012 and high clinical probability.  Definitive CT angiogram was not possible due to contrast  intolerance/allergy    Right shoulder pain  04/04/2015   Rotator cuff (capsule) sprain 03/13/2015   Seizure disorder, temporal lobe, intractable ([place]) 10/17/2014   Severe obesity (BMI >= 40) ([place]) 10/17/2014   Shingles 07/2013   Spinal stenosis of lumbar region 12/17/2012   S/P suboptimal response to Chiropractry ;[name] Cisco referred her  to [name] [phone]; FAX [phone] MRI 12/15/12 : stenosis @ L3-4; L 4-5  in context of DDD ESI recommended . 12/17/12 Note: I recommended discussion with [name],Neurologist; & [name], Diabetologist    Type 2 diabetes, controlled, with peripheral neuropathy ([place]) 04/04/2009   Qualifier: Diagnosis of  By: [name] MD, [name]   [name], Endo [name] , Ophth seen anually     Variants of migraine, not elsewhere classified, without mention of intractable migraine without mention of status migrainosus 05/22/2013    Tobacco History: Social History   Tobacco Use  Smoking Status Never  Smokeless Tobacco Never   Counseling given: Not Answered   Continue to not smoke  Outpatient Encounter Medications as of 09/10/2021  Medication Sig   albuterol (VENTOLIN HFA) 108 (90 Base) MCG/ACT inhaler Inhale 2 puffs into the lungs every 6 (six) hours as needed for wheezing or shortness of breath.   Bacillus Coagulans-Inulin (PROBIOTIC) 1-250 BILLION-MG CAPS Take 1 tablet by mouth daily at 6 (six) AM.   Budeson-Glycopyrrol-Formoterol (BREZTRI AEROSPHERE) 160-9-4.8 MCG/ACT AERO Inhale 2 puffs into the lungs in the morning and at bedtime.   budesonide-formoterol (SYMBICORT) 160-4.5 MCG/ACT inhaler Inhale 2 puffs into the lungs 2 (two) times daily.   Cholecalciferol (VITAMIN D) 50 MCG (2000 UT) CAPS Take 1 capsule by mouth daily at 6 (six) AM.   FLUoxetine (PROZAC) 20 MG capsule Take 1 capsule (20 mg total) by mouth daily.   furosemide (LASIX) 20 MG tablet Take 1 tablet (20 mg total) by mouth 3 (three) times a week. Take once on Tuesday, Thursday and Saturday.   gabapentin  (NEURONTIN) 100 MG capsule Take 1 capsule (100 mg total) by mouth 3 (three) times daily. (Patient taking differently: Take 100 mg by mouth daily.)   glipiZIDE (GLUCOTROL XL) 2.5 MG 24 hr tablet TAKE 1 TABLET BY MOUTH WITH BREAKFAST   ipratropium (ATROVENT) 0.03 % nasal spray Place 2 sprays into both nostrils every 12 (twelve) hours.   loratadine (CLARITIN) 10 MG tablet Take 10 mg by mouth  daily.   potassium chloride (KLOR-CON) 10 MEQ tablet Take 10 mEq by mouth 3 (three) times a week. Take on days you use Lasix   rizatriptan (MAXALT) 10 MG tablet Take 1 tablet (10 mg total) by mouth as needed for migraine. May repeat in 2 hours if needed   rosuvastatin (CRESTOR) 10 MG tablet Take 1 tablet (10 mg total) by mouth daily.   [DISCONTINUED] guaiFENesin (MUCINEX) 600 MG 12 hr tablet Take by mouth 2 (two) times daily.   No facility-administered encounter medications on file as of 09/10/2021.     Review of Systems  Review of Systems  N/a Physical Exam  BP 126/80   Pulse 90   Ht 5' (1.524 m)   Wt 295 lb (133.8 kg)   SpO2 98%   BMI 57.61 kg/m   Wt Readings from Last 5 Encounters:  09/10/21 295 lb (133.8 kg)  09/09/21 284 lb 12.8 oz (129.2 kg)  06/16/21 285 lb (129.3 kg)  06/09/21 289 lb (131.1 kg)  05/19/21 289 lb 6.4 oz (131.3 kg)    BMI Readings from Last 5 Encounters:  09/10/21 57.61 kg/m  09/09/21 55.62 kg/m  06/16/21 55.66 kg/m  06/09/21 56.44 kg/m  05/19/21 56.52 kg/m     Physical Exam General: Sitting in chair, no acute distress Eyes: EOMI, no icterus Neck: Supple, no JVP Pulmonary: Clear, distant, normal work of breathing Cardiovascular regular rate and rhythm, no murmur MSK: No synovitis, joint effusion Neuro: Normal gait, no weakness Psych: Normal mood, full affect   Assessment & Plan:   Dyspnea on exertion: Appears multifactorial.  Contribution of diastolic dysfunction, elevated left atrial pressure on recent TTE.  Some improvement with Lasix.  In  addition, suspect obesity contributing.  Also high suspicion for deconditioning given gradually decreased activity over time.  Finally, worry about poorly controlled asthma.  Improved with maintenance ICS/LABA for asthma.  Asthma:  Intolerance to Advair.  Unclear what may have contributed this.  We will avoid fluticasone salmeterol containing products for now.  Symbicort high-dose 2 puffs twice a day started 06/2021 with improved symptoms.  Nasal congestion, post nasal drip: Escalate sinus rinses from daily to BID, flonase BID x 7 days then daily, afrin BID x 3 days recommended today.   Return in about 6 months (around 03/10/2022).   Lanier Clam, MD 09/11/2021

## 2021-09-21 LAB — HM DIABETES EYE EXAM

## 2021-09-22 ENCOUNTER — Encounter: Payer: Self-pay | Admitting: Internal Medicine

## 2021-09-24 ENCOUNTER — Ambulatory Visit: Payer: Medicare Other | Admitting: Internal Medicine

## 2021-09-24 ENCOUNTER — Telehealth: Payer: Self-pay

## 2021-09-24 ENCOUNTER — Encounter: Payer: Self-pay | Admitting: Internal Medicine

## 2021-09-24 ENCOUNTER — Other Ambulatory Visit: Payer: Self-pay

## 2021-09-24 VITALS — BP 114/82 | HR 76 | Ht 60.0 in | Wt 280.6 lb

## 2021-09-24 DIAGNOSIS — E782 Mixed hyperlipidemia: Secondary | ICD-10-CM | POA: Diagnosis not present

## 2021-09-24 DIAGNOSIS — E1142 Type 2 diabetes mellitus with diabetic polyneuropathy: Secondary | ICD-10-CM

## 2021-09-24 MED ORDER — DAPAGLIFLOZIN PROPANEDIOL 10 MG PO TABS: 10.0000 mg | ORAL_TABLET | Freq: Every day | ORAL | 3 refills | Status: DC

## 2021-09-24 NOTE — Progress Notes (Signed)
Patient ID: Nicole Mccann, female   DOB: 07-03-1946, 75 y.o.   MRN: [id number]   This visit occurred during the SARS-CoV-2 public health emergency.  Safety protocols were in place, including screening questions prior to the visit, additional usage of staff PPE, and extensive cleaning of exam room while observing appropriate contact time as indicated for disinfecting solutions.   HPI: Nicole Mccann is a 75 y.o.-year-old female, presenting for follow-up for DM2, dx in initially in 1965 with GDM, then again GDM in 1974, non-insulin-dependent, controlled, with long-term complications (PN).  Last visit 4 months ago. She is here with her daughter who offers part of the history especially related to her blood sugars, diet, medications.  Interim history: She has increased urination from Lasix. No blurry vision, nausea, chest pain. At last visit she felt that her asthma/shortness of breath was worse.  She saw pulmonology.  Breathing improved. She continues to have leg swelling.   She is not very mobile at home.  Reviewed HbA1c levels: Lab Results  Component Value Date   HGBA1C 6.8 (H) 09/09/2021   HGBA1C 6.5 11/26/2020   HGBA1C 6.8 (A) 09/05/2020   HGBA1C 6.6 (A) 05/02/2020   HGBA1C 6.3 (A) 01/09/2019   HGBA1C 5.8 (A) 09/05/2018   HGBA1C 6.3 (A) 04/18/2018   HGBA1C 6.4 12/12/2017   HGBA1C 6.2 02/09/2017   HGBA1C 6.1 01/21/2016   HGBA1C 6.0 (H) 07/08/2012   HGBA1C 6.1 (H) 07/07/2012   HGBA1C 5.8 02/13/2010   HGBA1C 6.0 06/22/2007   HGBA1C 6.0 03/20/2007   She is on: - Glipizide XL 2.5-5 mg (approximately 40 minutes) before breakfast  SGLT2 inh were very expensive.  We had to stop Metformin ER due to significant diarrhea.  Pt checks her sugars once a day: - am: 114-142 >> 120-145, 150 >> 101-157 >> n/c >> 101-138 >> 133-167 - 2h after b'fast: n/c >> 186, 198 - before lunch: 130-150 (combined L + D) >> 144-184 >> 120, 170  >> n/c - 2h after lunch: n/c >> 133 >> n/c - before  dinner: 138 >> n/c >> 148 >> 107, 140 >> n/c - 2h after dinner: n/c >> 127-139 >> n/c - bedtime: n/c - nighttime: n/c Lowest sugar was 90 ... >> 107 >> 101 >> 118; she has hypoglycemia awareness in the 70s. Highest: 158 ... >> 198 >> 184 >> 170>> 167.  Glucometer: One Touch Verio  Pt's meals are: - Breakfast: cereals, yoghurt (Activia), pastries, tea, honey - Lunch: salad, leftover sandwich, fruit - Dinner: meat, veggies, potato - Snacks: Sweets, sweet liquor Mel Almond) >> stopped due to headaches  No CKD, last BUN/creatinine:  Lab Results  Component Value Date   BUN 14 09/09/2021   BUN 13 03/04/2021   CREATININE 0.82 09/09/2021   CREATININE 0.75 03/04/2021   + HL; last set of lipids: Lab Results  Component Value Date   CHOL 178 09/09/2021   HDL 53.90 09/09/2021   LDLCALC 97 09/09/2021   LDLDIRECT 92.0 09/05/2020   TRIG 134.0 09/09/2021   CHOLHDL 3 09/09/2021  She refused statins in the past but we were able to start rosuvastatin 10 mg daily, which she now tolerates well.  In 08/2020  we added fish oil 1 capsule of 1000 mg a day  - Last eye exam was on 09/21/2021: no DR, + cataracts.   - no numbness and tingling in her feet.  Pt has FH of DM in aunts.  She has OSA and is on a CPAP.  In  summer 2019, she had hAs >> temporal artery Bx negative. She developed anaphylaxis to Vancomycin while in the hospital.  ROS: + see HPI  Past Medical History:  Diagnosis Date   Adjustment disorder with mixed anxiety and depressed mood 03/14/2015   Dr Madaline Guthrie actively treating [place]    ALLERGIC RHINITIS 04/04/2009   Qualifier: Diagnosis of  By: Alwyn Ren MD, Chrissie Noa     Allergy    Anxiety    Dr Madaline Guthrie   Asthma    ASTHMA 04/04/2009   Qualifier: Diagnosis of  By: Alwyn Ren MD, [name]   Status Asthmaticus 10/2004     Atypical seizure (HCC) 06/07/2012   BENIGN POSITIONAL VERTIGO 11/06/2009   Qualifier: Diagnosis of  By: Linford Arnold MD, Catherine     Depression    Dr Madaline Guthrie   Diabetes  mellitus    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 08/17/13 seen in the emergency room; imaging negative. No labs. Referred to Dr. Pearletha Forge who recommended nonsteroidals and elevation. 10/18-11/3/14 in [place] for land and river cruise trip.  08/29/13 seen in ER in [place]; topical antibiotics & oral Ciprobay Rxed. 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 Xarelto 15 mg initiated 06/05/12 by Dr Jeraldine Loots , [facility] and increased to one pill twice a day. Despite this she had persistent thrombosis in the cephalic vein in the elbow and the upper forearm areas prompting change to Lovenox 110 mg e   Pulmonary embolism (HCC) 07/09/2012   Presumed based on intermediate VQ 07/07/2012 and high clinical probability.  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 12/17/12 Note: I recommended discussion with [name],Neurologist; & Dr Margaretmary Bayley, Diabetologist    Type 2 diabetes, controlled, with peripheral neuropathy (HCC) 04/04/2009   Qualifier: Diagnosis of  By: Alwyn Ren MD, Chrissie Noa   Dr Margaretmary Bayley, Endo Dr Emily Filbert , Ophth seen anually     Variants of migraine, not elsewhere classified, without mention of intractable migraine without mention of status migrainosus 05/22/2013   Past Surgical History:  Procedure Laterality Date   APPENDECTOMY     ARTERY BIOPSY Left 04/14/2018   Procedure: BIOPSY TEMPORAL ARTERY;  Surgeon: Elam Dutch, MD;  Location: [facility] OR;  Service: Vascular;  Laterality: Left;   BREAST EXCISIONAL BIOPSY Right 1998   CHOLECYSTECTOMY     COLONOSCOPY  2006   [facility]   rotator cuff surgery     TONSILLECTOMY      Social History   Socioeconomic History   Marital status: Married    Spouse name: Elita Quick   Number of children: 2   Years of education: College   Highest education level: Not on file  Occupational History   Not on file  Tobacco Use   Smoking status: Never   Smokeless tobacco: Never  Substance and Sexual Activity   Alcohol use: Yes    Alcohol/week: 3.0 standard drinks    Types: 3 Glasses of wine per week    Comment: 1/2 glass wine daily   Drug use: No   Sexual activity: Never  Other Topics Concern   Not on file  Social History Narrative   Patient is married Elita Quick) and lives at home with her husband.   Patient has two adult children.   Patient is retired.   Patient has a college education.   Patient is right-handed.   Patient drinks one cup of tea daily.   Social Determinants of Health   Financial Resource Strain: Low Risk    Difficulty of Paying Living Expenses: Not hard at all  Food Insecurity: No Food Insecurity   Worried About Charity fundraiser in the Last Year: Never  true   [place] in the Last Year: Never true  Transportation Needs: No Transportation Needs   Lack of Transportation (Medical): No   Lack of Transportation (Non-Medical): No  Physical Activity: Inactive   Days of Exercise per Week: 0 days   Minutes of Exercise per Session: 0 min  Stress: No Stress Concern Present   Feeling of Stress : Not at all  Social Connections: Moderately Integrated   Frequency of Communication with Friends and Family: More than three times a week   Frequency of Social Gatherings with Friends and Family: More than three times a week   Attends Religious Services: Never   Marine scientist or Organizations: Yes   Attends Music therapist: 1 to 4 times per year   Marital Status: Married  Human resources officer Violence: Not At Risk   Fear of Current or Ex-Partner: No   Emotionally Abused: No   Physically Abused: No   Sexually Abused: No   Current Outpatient Medications on File Prior to Visit  Medication Sig Dispense Refill   albuterol (VENTOLIN HFA) 108 (90 Base) MCG/ACT inhaler Inhale 2 puffs into the lungs every 6 (six) hours as needed for wheezing or shortness of breath. 18 g 6   Bacillus Coagulans-Inulin (PROBIOTIC) 1-250 BILLION-MG CAPS Take 1 tablet by mouth daily at 6 (six) AM.     Budeson-Glycopyrrol-Formoterol (BREZTRI AEROSPHERE) 160-9-4.8 MCG/ACT AERO Inhale 2 puffs into the lungs in the morning and at bedtime. 4.8 g 0   budesonide-formoterol (SYMBICORT) 160-4.5 MCG/ACT inhaler Inhale 2 puffs into the lungs 2 (two) times daily. 1 each 12   Cholecalciferol (VITAMIN D) 50 MCG (2000 UT) CAPS Take 1 capsule by mouth daily at 6 (six) AM.     FLUoxetine (PROZAC) 20 MG capsule Take 1 capsule (20 mg total) by mouth daily. 90 capsule 3   furosemide (LASIX) 20 MG tablet Take 1 tablet (20 mg total) by mouth 3 (three) times a week. Take once on  Tuesday, Thursday and Saturday. 52 tablet 2   gabapentin (NEURONTIN) 100 MG capsule Take 1 capsule (100  mg total) by mouth 3 (three) times daily. (Patient taking differently: Take 100 mg by mouth daily.) 270 capsule 1   glipiZIDE (GLUCOTROL XL) 2.5 MG 24 hr tablet TAKE 1 TABLET BY MOUTH WITH BREAKFAST 90 tablet 2   ipratropium (ATROVENT) 0.03 % nasal spray Place 2 sprays into both nostrils every 12 (twelve) hours. 30 mL 12   loratadine (CLARITIN) 10 MG tablet Take 10 mg by mouth daily.     potassium chloride (KLOR-CON) 10 MEQ tablet Take 10 mEq by mouth 3 (three) times a week. Take on days you use Lasix     rizatriptan (MAXALT) 10 MG tablet Take 1 tablet (10 mg total) by mouth as needed for migraine. May repeat in 2 hours if needed 12 tablet 3   rosuvastatin (CRESTOR) 10 MG tablet Take 1 tablet (10 mg total) by mouth daily. 90 tablet 3   No current facility-administered medications on file prior to visit.   Allergies  Allergen Reactions   Mushroom Ext Cmplx(Shiitake-Reishi-Mait) Anaphylaxis   Shellfish Allergy Anaphylaxis   Sulfonamide Derivatives Anaphylaxis   Vancomycin Anaphylaxis   Fluticasone-Salmeterol Other (See Comments)    Feels like something in the throat   Butalbital-Aspirin-Caffeine Other (See Comments)    UNSPECIFIED REACTION    Montelukast Sodium Other (See Comments)    Feels like she's running out of breath   Penicillins     UNSPECIFIED REACTION      Childhood allergy Has patient had a PCN reaction causing immediate rash, facial/tongue/throat swelling, SOB or lightheadedness with hypotension: Unknown Has patient had a PCN reaction causing severe rash involving mucus membranes or skin necrosis: Unknown Has patient had a PCN reaction that required hospitalization: No Has patient had a PCN reaction occurring within the last 10 years: No If all of the above answers are "NO", then may proceed with Cephalosporin use.     Acetaminophen Other (See Comments)    Stomach discomfort   Clarithromycin Nausea And Vomiting   Iodinated Diagnostic Agents Other (See Comments)    Bad  headache; no prep needed   Levofloxacin Diarrhea and Nausea And Vomiting    Dizziness Believes it was due to a high dosage of this medication   Orphenadrine Nausea And Vomiting   Family History  Problem Relation Age of Onset   Stomach cancer Father    Coronary artery disease Father    Breast cancer Sister 72       breast cancer   Diabetes Maternal Aunt    Diabetes Paternal Aunt    Prostate cancer Brother    Cancer Daughter    Stroke Neg Hx    PE: BP 114/82 (BP Location: Left Arm, Patient Position: Sitting, Cuff Size: Large)   Pulse 76   Ht 5' (1.524 m)   Wt 280 lb 9.6 oz (127.3 kg)   SpO2 96%   BMI 54.80 kg/m  Wt Readings from Last 3 Encounters:  09/24/21 280 lb 9.6 oz (127.3 kg)  09/10/21 295 lb (133.8 kg)  09/09/21 284 lb 12.8 oz (129.2 kg)   Constitutional: overweight, in NAD, + using a walker Eyes: PERRLA, EOMI, no exophthalmos ENT: moist mucous membranes, no thyromegaly, no cervical lymphadenopathy Cardiovascular: RRR, No MRG,+ LE edema B Respiratory: CTA B Gastrointestinal: abdomen soft, NT, ND, BS+ Musculoskeletal: no deformities, strength intact in all 4 Skin: moist, warm, + stasis dermatitis bilateral legs Neurological: no tremor with outstretched  hands, DTR normal in all 4  ASSESSMENT: 1. DM2, non-insulin-dependent, now more controlled, with complications - PN  2. HL  3.  Obesity class III  PLAN:  1. Patient with fairly well-controlled type 2 diabetes, previously on metformin IR and ER, which she was taking inconsistently due to diarrhea and also fecal incontinence we switched to a low-dose glipizide XL that she felt that this was causing her GI symptoms and stopped it.  However, retrospectively, but daughter felt that the GI symptoms may have been caused by a protein bar that she was eating in the morning so we were able to restart sulfonylurea, now tolerated well.  PCP also referred her to nutrition.  Also, in the past she was drinking sweet liquor at  night and sugars are higher in the morning.  She realized this and stopped and sugars improved afterwards. -Her diabetes management is limited by previous side effects and preferences regarding different available options: SGLT2 inhibitors were too expensive in the past.  I sent a new prescription for these to her pharmacy in 08/2020, but she was not able to start them due to price. Daughter called the insurance and SGLT2 inhibitors were not covered. She refused injectables including insulin and GLP-1 receptor agonist. Actos may cause some more swelling.   She could not tolerate Metformin. -At last visit, we discussed about the possibility of increasing her glipizide to 2 tablets in the days that she ate more or had more desserts.  At that time, HbA1c was at goal, decreased to 6.4%, but she has been she had another HbA1c obtained by PCP at the beginning of this month and this was higher, at 6.8%. -At this visit, we discussed about trying to get an SGLT2 inhibitor through [employer] patient assistance program, from which she is already obtaining another medication, for asthma.  At today's visit, patient's daughter filled out the paperwork and we will send a request for Farxiga 10 mg before breakfast.  I did advise her about the benefits and possible side effects of increased urination.  I advised her  to stop glipizide when she starts [place] but to add a small dose of glipizide back (2.5 mg before breakfast) if sugars are high afterwards. - I suggested to:  Patient Instructions  Please start: - Farxiga 10 mg before b'fast  Stop: - Glipizide XL when you start Farxiga, but may need to add back 2.5 mg if sugars before b'fast if sugars are high afterwards.  Please return in 4 months with your sugar log.  - advised to check sugars at different times of the day - 1x a day, rotating check times - advised for yearly eye exams >> she is UTD - return to clinic in 4 months  2.  HL -Reviewed latest lipid  panel from 09/2021: LDL above target, rest the fractions at goal: Lab Results  Component Value Date   CHOL 178 09/09/2021   HDL 53.90 09/09/2021   LDLCALC 97 09/09/2021   LDLDIRECT 92.0 09/05/2020   TRIG 134.0 09/09/2021   CHOLHDL 3 09/09/2021  -Previously refused statins but currently on Crestor 10 mg daily, tolerated.  We will start patient on 1000 mg daily.  3.  Obesity class III -At this visit, we will planning to start Farxiga, which should also help with weight loss -She gained approximately 10 pounds since last visit, but then lost back (daily?)  Philemon Kingdom, MD PhD [facility] Endocrinology

## 2021-09-24 NOTE — Patient Instructions (Addendum)
Please start: - Farxiga 10 mg before b'fast  Stop: - Glipizide XL when you start Farxiga, but may need to add back 2.5 mg if sugars before b'fast if sugars are high afterwards.  Please return in 4 months with your sugar log.

## 2021-09-24 NOTE — Telephone Encounter (Signed)
[employer] patient assistance faxed for Farxiga 10mg . Fax #: [phone]

## 2021-12-24 ENCOUNTER — Other Ambulatory Visit: Payer: Self-pay | Admitting: Internal Medicine

## 2021-12-24 ENCOUNTER — Other Ambulatory Visit: Payer: Self-pay | Admitting: Family Medicine

## 2021-12-24 DIAGNOSIS — E1142 Type 2 diabetes mellitus with diabetic polyneuropathy: Secondary | ICD-10-CM

## 2021-12-24 DIAGNOSIS — F339 Major depressive disorder, recurrent, unspecified: Secondary | ICD-10-CM

## 2022-01-05 ENCOUNTER — Encounter: Payer: Self-pay | Admitting: Family Medicine

## 2022-01-21 ENCOUNTER — Other Ambulatory Visit: Payer: Self-pay

## 2022-01-21 MED ORDER — GABAPENTIN 100 MG PO CAPS: 100.0000 mg | ORAL_CAPSULE | Freq: Three times a day (TID) | ORAL | 1 refills | Status: DC

## 2022-01-28 ENCOUNTER — Other Ambulatory Visit: Payer: Self-pay

## 2022-01-28 ENCOUNTER — Encounter: Payer: Self-pay | Admitting: Internal Medicine

## 2022-01-28 ENCOUNTER — Ambulatory Visit: Payer: Medicare Other | Admitting: Internal Medicine

## 2022-01-28 VITALS — BP 128/78 | HR 79 | Ht 60.0 in | Wt 283.8 lb

## 2022-01-28 DIAGNOSIS — E1142 Type 2 diabetes mellitus with diabetic polyneuropathy: Secondary | ICD-10-CM

## 2022-01-28 DIAGNOSIS — E782 Mixed hyperlipidemia: Secondary | ICD-10-CM | POA: Diagnosis not present

## 2022-01-28 LAB — POCT GLYCOSYLATED HEMOGLOBIN (HGB A1C): Hemoglobin A1C: 6.7 % — AB (ref 4.0–5.6)

## 2022-01-28 NOTE — Patient Instructions (Signed)
Please continue: - Farxiga 10 mg before b'fast.  Please return in 4 months with your sugar log.  

## 2022-01-28 NOTE — Progress Notes (Signed)
Patient ID: Nicole Mccann, female   DOB: 1946/06/03, 76 y.o.   MRN: [id number]  ? ?This visit occurred during the SARS-CoV-2 public health emergency.  Safety protocols were in place, including screening questions prior to the visit, additional usage of staff PPE, and extensive cleaning of exam room while observing appropriate contact time as indicated for disinfecting solutions.  ? ?HPI: ?Nicole Mccann is a 76 y.o.-year-old female, presenting for follow-up for DM2, dx in initially in 1965 with GDM, then again GDM in 1974, non-insulin-dependent, controlled, with long-term complications (PN).  Last visit 4 months ago. ?She is here with her daughter who offers part of the history especially related to her blood sugars, diet, medications. ? ?Interim history: ?No blurry vision, nausea, chest pain.  She has increased urination. ?At last visit she felt that her asthma/shortness of breath was worse.  She saw pulmonology.  Breathing improved.   ?She continues to not be very mobile at home.  She has leg swelling. ?She sees pulmonology for asthma/shortness of breath. ? ?Reviewed HbA1c levels: ?Lab Results  ?Component Value Date  ? HGBA1C 6.8 (H) 09/09/2021  ? HGBA1C 6.5 11/26/2020  ? HGBA1C 6.8 (A) 09/05/2020  ? HGBA1C 6.6 (A) 05/02/2020  ? HGBA1C 6.3 (A) 01/09/2019  ? HGBA1C 5.8 (A) 09/05/2018  ? HGBA1C 6.3 (A) 04/18/2018  ? HGBA1C 6.4 12/12/2017  ? HGBA1C 6.2 02/09/2017  ? HGBA1C 6.1 01/21/2016  ? HGBA1C 6.0 (H) 07/08/2012  ? HGBA1C 6.1 (H) 07/07/2012  ? HGBA1C 5.8 02/13/2010  ? HGBA1C 6.0 06/22/2007  ? HGBA1C 6.0 03/20/2007  ? ?She was on: ?- Glipizide XL 2.5-5 mg (approximately 40 minutes) before breakfast  ?SGLT2 inh were very expensive.  ?We had to stop Metformin ER due to significant diarrhea. ? ?We changed to: ?- Farxiga 10 mg before b'fast ? ?Pt checks her sugars once a day: ?- am:  101-157 >> n/c >> 101-138 >> 133-167 >> 131-157 (higher off [place]) ?- 2h after b'fast: n/c >> 186, 198 >> 120 ?- before lunch:  130-150 >> 144-184 >> 120, 170  >> n/c ?- 2h after lunch: n/c >> 133 >> n/c ?- before dinner: 138 >> n/c >> 148 >> 107, 140 >> n/c >> 129-132 ?- 2h after dinner: n/c >> 127-139 >> n/c ?- bedtime: n/c ?- nighttime: n/c ?Lowest sugar was 90 ... >> 107 >> 101 >> 118 >> 113; she has hypoglycemia awareness in the 70s. ?Highest: 158 ... >> 198 >> 184 >> 170 >> 167 >> 157. ? ?Glucometer: One Touch Verio ? ?Pt's meals are: ?- Breakfast: cereals, yoghurt (Activia), pastries, tea, honey ?- Lunch: salad, leftover sandwich, fruit ?- Dinner: meat, veggies, potato ?- Snacks: Sweets, sweet liquor Fredric Mare) >> stopped due to headaches ? ?No CKD, last BUN/creatinine:  ?Lab Results  ?Component Value Date  ? BUN 14 09/09/2021  ? BUN 13 03/04/2021  ? CREATININE 0.82 09/09/2021  ? CREATININE 0.75 03/04/2021  ? ?+ HL; last set of lipids: ?Lab Results  ?Component Value Date  ? CHOL 178 09/09/2021  ? HDL 53.90 09/09/2021  ? LDLCALC 97 09/09/2021  ? LDLDIRECT 92.0 09/05/2020  ? TRIG 134.0 09/09/2021  ? CHOLHDL 3 09/09/2021  ?She refused statins in the past but we were able to start rosuvastatin 10 mg daily, which she now tolerates well.  In 08/2020  we added fish oil 1 capsule of 1000 mg a day ? ?- Last eye exam was on 09/21/2021: no DR, + cataracts.  ? ?- no numbness and  tingling in her feet. ? ?Pt has FH of DM in aunts. ? ?She has OSA and is on a CPAP. ? ?In summer 2019, she had hAs >> temporal artery Bx negative. She developed anaphylaxis to Vancomycin while in the hospital. ? ?ROS: ?+ see HPI ? ?Past Medical History:  ?Diagnosis Date  ? Adjustment disorder with mixed anxiety and depressed mood 03/14/2015  ? Dr Madaline Guthrie actively treating  [place]ink   ? ALLERGIC RHINITIS 04/04/2009  ? Qualifier: Diagnosis of  By: Alwyn Ren MD, Chrissie Noa    ? Allergy   ? Anxiety   ? Dr Madaline Guthrie  ? Asthma   ? ASTHMA 04/04/2009  ? Qualifier: Diagnosis of  By: Alwyn Ren MD, [name]   Status Asthmaticus 10/2004    ? Atypical seizure (HCC) 06/07/2012  ? BENIGN POSITIONAL VERTIGO  11/06/2009  ? Qualifier: Diagnosis of  By: Linford Arnold MD, Santina Evans    ? Depression   ? Dr Madaline Guthrie  ? Diabetes mellitus   ? Disturbances of sensation of smell and taste 05/22/2013  ? Diverticulosis of colon 04/04/2009  ? Qualifier: Diagnosis of  By: Alwyn Ren MD, [name]    ? Dizziness and giddiness 12/22/2016  ? DVT (deep venous thrombosis) (HCC)   ? Headache(784.0)   ? Hyperlipidemia   ? Hypersomnia with sleep apnea, unspecified 05/22/2014  ? Injury of left knee 08/22/2013  ? [place]15[place]13 [place] contusion and abrasion of left knee. 08/17/13 seen in the emergency room; imaging negative. No labs. Referred to Dr. Pearletha Forge who recommended nonsteroidals and elevation. 10/18-11/3/14 in [place] for land and river cruise trip.  08/29/13 seen in ER in [place]; topical antibiotics & oral Ciprobay Rxed. 10/23 at second ER I&D declined ; wound  ? Low back pain 05/26/2016  ? Low blood pressure   ? Mixed hyperlipidemia 07/10/2019  ? 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 Olfactory aura   ? OSA on CPAP   ? Osteopenia 09/21/2016  ? dexa 09/2016  ? Phlebitis following infusion 06/05/2012  ? Onset 06/01/12;S/P Dilantin infusion 05/30/12 @ [facility] for atypical seizure activity with response 06/05/12 DVT of Cephalic vein elbow- mid upper arm. Xarelto 15 mg initiated 06/05/12 by Dr Jeraldine Loots , [facility] and increased to one pill twice a day. Despite this she had persistent thrombosis in the cephalic vein in the elbow and the upper forearm areas prompting change to Lovenox 110 mg e  ? Pulmonary embolism (HCC) 07/09/2012  ? Presumed based on intermediate VQ 07/07/2012 and high clinical probability.  Definitive CT angiogram was not possible due to contrast  intolerance/allergy   ? Right shoulder pain 04/04/2015  ? Rotator cuff (capsule) sprain 03/13/2015  ? Seizure disorder, temporal lobe, intractable (HCC) 10/17/2014  ? Severe obesity (BMI >= 40) (HCC) 10/17/2014  ? Shingles 07/2013  ? Spinal stenosis of  lumbar region 12/17/2012  ? S/P suboptimal response to Chiropractry ;Dr Kai Levins referred her  to Dr Sherwood Gambler  [facility] [phone]; Valinda Hoar [phone] MRI 12/15/12 : stenosis @ L3-4; L 4-5  in context of DDD ESI recommended . 12/17/12 Note: I recommended discussion with [name],Neurologist; & Dr Margaretmary Bayley, Diabetologist   ? Type 2 diabetes, controlled, with peripheral neuropathy (HCC) 04/04/2009  ? Qualifier: Diagnosis of  By: Alwyn Ren MD, Chrissie Noa   Dr Margaretmary Bayley, Endo Dr Emily Filbert , Ophth seen anually    ? Variants of migraine, not elsewhere classified, without mention of intractable migraine without mention of status migrainosus 05/22/2013  ? ?Past Surgical  History:  ?Procedure Laterality Date  ? APPENDECTOMY    ? ARTERY BIOPSY Left 04/14/2018  ? Procedure: BIOPSY TEMPORAL ARTERY;  Surgeon: Sherren Kerns, MD;  Location: [facility] OR;  Service: Vascular;  Laterality: Left;  ? BREAST EXCISIONAL BIOPSY Right 1998  ? CHOLECYSTECTOMY    ? COLONOSCOPY  2006  ? [facility]  ? rotator cuff surgery    ? TONSILLECTOMY     ? ?Social History  ? ?Socioeconomic History  ? Marital status: Married  ?  Spouse name: Lars Mage  ? Number of children: 2  ? Years of education: College  ? Highest education level: Not on file  ?Occupational History  ? Not on file  ?Tobacco Use  ? Smoking status: Never  ? Smokeless tobacco: Never  ?Substance and Sexual Activity  ? Alcohol use: Yes  ?  Alcohol/week: 3.0 standard drinks  ?  Types: 3 Glasses of wine per week  ?  Comment: 1/2 glass wine daily  ? Drug use: No  ? Sexual activity: Never  ?Other Topics Concern  ? Not on file  ?Social History Narrative  ? Patient is married Lars Mage) and lives at home with her husband.  ? Patient has two adult children.  ? Patient is retired.  ? Patient has a college education.  ? Patient is right-handed.  ? Patient drinks one cup of tea daily.  ? ?Social Determinants of Health  ? ?Financial Resource Strain: Low Risk   ? Difficulty of Paying Living Expenses: Not hard at  all  ?Food Insecurity: No Food Insecurity  ? Worried About Programme researcher, broadcasting/film/video in the Last Year: Never true  ? Ran Out of Food in the Last Year: Never true  ?Transportation Needs: No Transportation Needs

## 2022-02-02 ENCOUNTER — Other Ambulatory Visit: Payer: Self-pay | Admitting: Family Medicine

## 2022-02-02 DIAGNOSIS — Z1231 Encounter for screening mammogram for malignant neoplasm of breast: Secondary | ICD-10-CM

## 2022-02-11 ENCOUNTER — Ambulatory Visit
Admission: RE | Admit: 2022-02-11 | Discharge: 2022-02-11 | Disposition: A | Discharge status: Home / Self Care | Payer: Medicare Other | Source: Ambulatory Visit | Attending: Family Medicine | Admitting: Family Medicine

## 2022-02-11 DIAGNOSIS — Z1231 Encounter for screening mammogram for malignant neoplasm of breast: Secondary | ICD-10-CM

## 2022-03-02 NOTE — Patient Instructions (Addendum)
It was nice to see you today ?Please consider getting the shingles vaccine at your pharmacy ?Also, you most likely need a COVID booster!  Now recommend a 2nd dose of the newer/ bivalent shot ? ?I will be in touch with your labs ?I would suggest getting back on the Symbicort twice a day ? ?Assuming all is well please see me in 6 months  ?

## 2022-03-02 NOTE — Progress Notes (Addendum)
[facility] at [employer] ?[address], [address] ?[place], [place] [address] ?336 [phone] ?Fax [phone] ? ?Date:  03/08/2022  ? ?Name:  Nicole Mccann   DOB:  10/16/1946   MRN:  [id number] ? ?PCP:  Pearline Cables, MD  ? ? ?Chief Complaint: 6 month follow up (Concerns/ questions: pain in neck and bulge tried Voltaren gel and this helped.  /) ? ? ?History of Present Illness: ? ?Nicole Mccann is a 76 y.o. very pleasant female patient who presents with the following: ? ?Patient seen today for periodic follow-up ?History of controlled diabetes with polyneuropathy, obesity, hyperlipidemia, seizure disorder, sleep apnea on CPAP, venous insufficiency with swelling of bilateral lower extremities, asthma ?Most recent visit with myself was in November ? ?At that time she has seen pulmonology and also endocrinology ?Most recent visit with endocrinology, Dr. Elvera Lennox in March-right now have her taking Farxiga 10 mg as well as Crestor 10 mg ? ?Lab Results  ?Component Value Date  ? HGBA1C 6.7 (A) 01/28/2022  ? ?Labs done in November-CBC, TSH, A1c, vit D, CMP ?She was seen by pulmonology in November for concern of dyspnea: ?Dyspnea on exertion: Appears multifactorial.  Contribution of diastolic dysfunction, elevated left atrial pressure on recent TTE.  Some improvement with Lasix.  In addition, suspect obesity contributing.  Also high suspicion for deconditioning given gradually decreased activity over time.  Finally, worry about poorly controlled asthma.  Improved with maintenance ICS/LABA for asthma.-------- ?Asthma:  Intolerance to Advair.  Unclear what may have contributed this.  We will avoid fluticasone salmeterol containing products for now.  Symbicort high-dose 2 puffs twice a day started 06/2021 with improved symptoms.-------- ?Nasal congestion, post nasal drip: Escalate sinus rinses from daily to BID, flonase BID x 7 days then daily, afrin BID x 3 days recommended today. ? ?Pt notes she  is not taking Symbicort any longer;  ?She is using her albuterol as needed but reports she has not needed it in the past week ?I recommended that she continue to use symbicort as directed and she will take this under advisement ? ?Pt notes her glucose may be 143 fasting in the am  ?It seems like it might go down a little bit later in the day ?Her weight is stable ?She notes she weighs about 10 lbs less when nude at home  ? ? ?Wt Readings from Last 3 Encounters:  ?03/08/22 282 lb 6.4 oz (128.1 kg)  ?01/28/22 283 lb 12.8 oz (128.7 kg)  ?09/24/21 280 lb 9.6 oz (127.3 kg)  ? ?She is taking prozac 20 mg daily- this is working well for her, she is happy with this dosage.  Does not need to increase dosage  ? ? ?Patient Active Problem List  ? Diagnosis Date Noted  ? SOB (shortness of breath) 05/12/2021  ? Diastolic dysfunction 05/12/2021  ? Chronic diastolic heart failure (HCC) 05/12/2021  ? Morbid obesity (HCC) 05/12/2021  ? PSVT (paroxysmal supraventricular tachycardia) (HCC) 05/12/2021  ? Hypertriglyceridemia 05/12/2021  ? Diabetes mellitus due to underlying condition with hyperosmolarity without coma, without long-term current use of insulin (HCC) 05/12/2021  ? Olfactory aura   ? Low blood pressure   ? Hyperlipidemia   ? Depression   ? Anxiety   ? Allergy   ? Bilateral headaches 12/09/2020  ? Mixed hyperlipidemia 07/10/2019  ? Dizziness and giddiness 12/22/2016  ? Osteopenia 09/21/2016  ? Low back pain 05/26/2016  ? Right shoulder pain 04/04/2015  ?  Adjustment disorder with mixed anxiety and depressed mood 03/14/2015  ? Rotator cuff (capsule) sprain 03/13/2015  ? Pseudoptosis 11/18/2014  ? Seizure disorder, temporal lobe, intractable (HCC) 10/17/2014  ? 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 Severe obesity (BMI >= 40) (HCC) 10/17/2014  ? OSA on CPAP 10/17/2014  ? Hypersomnia with sleep apnea, unspecified 05/22/2014  ? Injury of left knee 08/22/2013  ? Shingles 07/2013  ? Variants of migraine, not elsewhere  classified, without mention of intractable migraine without mention of status migrainosus 05/22/2013  ? Disturbances of sensation of smell and taste 05/22/2013  ? Spinal stenosis of lumbar region 12/17/2012  ? Pulmonary embolism (HCC) 07/09/2012  ? DVT (deep venous thrombosis) (HCC) 07/07/2012  ? Atypical seizure (HCC) 06/07/2012  ? Phlebitis following infusion 06/05/2012  ? BENIGN POSITIONAL VERTIGO 11/06/2009  ? Type 2 diabetes, controlled, with peripheral neuropathy (HCC) 04/04/2009  ? Allergic rhinitis 04/04/2009  ? ASTHMA 04/04/2009  ? Diverticulosis of colon 04/04/2009  ? ? ?Past Medical History:  ?Diagnosis Date  ? Adjustment disorder with mixed anxiety and depressed mood 03/14/2015  ? Dr Madaline Guthrie actively treating [place] Sink   ? ALLERGIC RHINITIS 04/04/2009  ? Qualifier: Diagnosis of  By: Alwyn Ren MD, Chrissie Noa    ? Allergy   ? Anxiety   ? Dr Madaline Guthrie  ? Asthma   ? ASTHMA 04/04/2009  ? Qualifier: Diagnosis of  By: Alwyn Ren MD, [name]   Status Asthmaticus 10/2004    ? Atypical seizure (HCC) 06/07/2012  ? BENIGN POSITIONAL VERTIGO 11/06/2009  ? Qualifier: Diagnosis of  By: Linford Arnold MD, Santina Evans    ? Depression   ? Dr Madaline Guthrie  ? Diabetes mellitus   ? Disturbances of sensation of smell and taste 05/22/2013  ? Diverticulosis of colon 04/04/2009  ? Qualifier: Diagnosis of  By: Alwyn Ren MD, [name]    ? Dizziness and giddiness 12/22/2016  ? DVT (deep venous thrombosis) (HCC)   ? Headache(784.0)   ? Hyperlipidemia   ? Hypersomnia with sleep apnea, unspecified 05/22/2014  ? Injury of left knee 08/22/2013  ? 08/15/13 mechanical [place] sustaining contusion and abrasion of left knee. 08/17/13 seen in the emergency room; imaging negative. No labs. Referred to Dr. Pearletha Forge who recommended nonsteroidals and elevation. 10/18-11/3/14 in [place] for land and river cruise trip.  08/29/13 seen in ER in [place]; topical antibiotics & oral Ciprobay Rxed. 10/23 at second ER I&D declined ; wound  ? Low back pain 05/26/2016  ? Low blood  pressure   ? Mixed hyperlipidemia 07/10/2019  ? 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 Olfactory aura   ? OSA on CPAP   ? Osteopenia 09/21/2016  ? dexa 09/2016  ? Phlebitis following infusion 06/05/2012  ? Onset 06/01/12;S/P Dilantin infusion 05/30/12 @ [facility] for atypical seizure activity with response 06/05/12 DVT of Cephalic vein elbow- mid upper arm. Xarelto 15 mg initiated 06/05/12 by Dr Jeraldine Loots , [facility] and increased to one pill twice a day. Despite this she had persistent thrombosis in the cephalic vein in the elbow and the upper forearm areas prompting change to Lovenox 110 mg e  ? Pulmonary embolism (HCC) 07/09/2012  ? Presumed based on intermediate VQ 07/07/2012 and high clinical probability.  Definitive CT angiogram was not possible due to contrast  intolerance/allergy   ? Right shoulder pain 04/04/2015  ? Rotator cuff (capsule) sprain 03/13/2015  ? Seizure disorder, temporal lobe, intractable (HCC) 10/17/2014  ? Severe obesity (BMI >= 40) (HCC) 10/17/2014  ? Shingles  07/2013  ? Spinal stenosis of lumbar region 12/17/2012  ? S/P suboptimal response to Chiropractry ;Dr Kai Levins referred her  to Dr Sherwood Gambler  [facility] [phone]; Valinda Hoar [phone] MRI 12/15/12 : stenosis @ L3-4; L 4-5  in context of DDD ESI recommended . 12/17/12 Note: I recommended discussion with [name],Neurologist; & Dr Margaretmary Bayley, Diabetologist   ? Type 2 diabetes, controlled, with peripheral neuropathy (HCC) 04/04/2009  ? Qualifier: Diagnosis of  By: Alwyn Ren MD, Chrissie Noa   Dr Margaretmary Bayley, Endo Dr Emily Filbert , Ophth seen anually    ? Variants of migraine, not elsewhere classified, without mention of intractable migraine without mention of status migrainosus 05/22/2013  ? ? ?Past Surgical History:  ?Procedure Laterality Date  ? APPENDECTOMY    ? ARTERY BIOPSY Left 04/14/2018  ? Procedure: BIOPSY TEMPORAL ARTERY;  Surgeon: Sherren Kerns, MD;  Location: [facility] OR;  Service: Vascular;  Laterality: Left;  ? BREAST EXCISIONAL  BIOPSY Right 1998  ? CHOLECYSTECTOMY    ? COLONOSCOPY  2006  ? [facility]  ? rotator cuff surgery    ? TONSILLECTOMY     ? ? ?Social History  ? ?Tobacco Use  ? Smoking status: Never  ? Smokeless tobacco: Never  ?S

## 2022-03-08 ENCOUNTER — Ambulatory Visit: Payer: Medicare Other | Admitting: Family Medicine

## 2022-03-08 ENCOUNTER — Encounter: Payer: Self-pay | Admitting: Family Medicine

## 2022-03-08 VITALS — BP 120/80 | HR 75 | Temp 97.6°F | Resp 18 | Ht 60.0 in | Wt 282.4 lb

## 2022-03-08 DIAGNOSIS — R0602 Shortness of breath: Secondary | ICD-10-CM

## 2022-03-08 DIAGNOSIS — F339 Major depressive disorder, recurrent, unspecified: Secondary | ICD-10-CM

## 2022-03-08 DIAGNOSIS — G43801 Other migraine, not intractable, with status migrainosus: Secondary | ICD-10-CM

## 2022-03-08 DIAGNOSIS — E1142 Type 2 diabetes mellitus with diabetic polyneuropathy: Secondary | ICD-10-CM | POA: Diagnosis not present

## 2022-03-08 DIAGNOSIS — E782 Mixed hyperlipidemia: Secondary | ICD-10-CM

## 2022-03-09 ENCOUNTER — Encounter: Payer: Self-pay | Admitting: Family Medicine

## 2022-03-09 ENCOUNTER — Ambulatory Visit: Payer: Medicare Other | Admitting: Internal Medicine

## 2022-03-09 LAB — CBC
HCT: 47.1 % — ABNORMAL HIGH (ref 36.0–46.0)
Hemoglobin: 15.4 g/dL — ABNORMAL HIGH (ref 12.0–15.0)
MCHC: 32.8 g/dL (ref 30.0–36.0)
MCV: 93.1 fl (ref 78.0–100.0)
Platelets: 208 10*3/uL (ref 150.0–400.0)
RBC: 5.06 Mil/uL (ref 3.87–5.11)
RDW: 13.9 % (ref 11.5–15.5)
WBC: 7.8 10*3/uL (ref 4.0–10.5)

## 2022-03-09 LAB — BASIC METABOLIC PANEL
BUN: 14 mg/dL (ref 6–23)
CO2: 28 mEq/L (ref 19–32)
Calcium: 9.1 mg/dL (ref 8.4–10.5)
Chloride: 103 mEq/L (ref 96–112)
Creatinine, Ser: 0.94 mg/dL (ref 0.40–1.20)
GFR: 58.24 mL/min — ABNORMAL LOW (ref >60.00)
Glucose, Bld: 142 mg/dL — ABNORMAL HIGH (ref 70–99)
Potassium: 4.6 mEq/L (ref 3.5–5.1)
Sodium: 140 mEq/L (ref 135–145)

## 2022-05-24 ENCOUNTER — Encounter: Payer: Self-pay | Admitting: Internal Medicine

## 2022-06-01 ENCOUNTER — Ambulatory Visit: Payer: Medicare Other | Admitting: Internal Medicine

## 2022-06-04 ENCOUNTER — Encounter: Payer: Self-pay | Admitting: Internal Medicine

## 2022-06-04 ENCOUNTER — Ambulatory Visit (INDEPENDENT_AMBULATORY_CARE_PROVIDER_SITE_OTHER): Payer: Medicare Other | Admitting: Internal Medicine

## 2022-06-04 VITALS — BP 118/84 | HR 67 | Ht 60.0 in | Wt 278.6 lb

## 2022-06-04 DIAGNOSIS — E1142 Type 2 diabetes mellitus with diabetic polyneuropathy: Secondary | ICD-10-CM | POA: Diagnosis not present

## 2022-06-04 DIAGNOSIS — E782 Mixed hyperlipidemia: Secondary | ICD-10-CM

## 2022-06-04 LAB — POCT GLYCOSYLATED HEMOGLOBIN (HGB A1C): Hemoglobin A1C: 6.7 % — AB (ref 4.0–5.6)

## 2022-06-04 MED ORDER — DAPAGLIFLOZIN PROPANEDIOL 10 MG PO TABS: 10.0000 mg | ORAL_TABLET | Freq: Every day | ORAL | 3 refills | Status: DC

## 2022-06-04 NOTE — Patient Instructions (Signed)
Please continue: - Farxiga 10 mg before b'fast.  Please return in 4 months with your sugar log.  

## 2022-06-04 NOTE — Addendum Note (Signed)
Addended by: Eden Lathe on: 06/04/2022 04:44 PM   Modules accepted: Orders

## 2022-06-04 NOTE — Progress Notes (Signed)
Patient ID: ARRYN Mccann, female   DOB: Jul 30, 1946, 76 y.o.   MRN: [id number]   HPI: Nicole Mccann is a 76 y.o.-year-old female, presenting for follow-up for DM2, dx in initially in 1965 with GDM, then again GDM in 1974, non-insulin-dependent, controlled, with long-term complications (PN).  Last visit 4 months ago. She is here with her daughter who offers part of the history especially related to her blood sugars, diet, medications.  Interim history: No blurry vision, nausea, chest pain.  She has increased urination.  She also has shortness of breath (asthma). She continues to not be very mobile at home.  She has leg swelling. He had a URI - on [place]. She has been having diarrhea -daughter thinks that this was caused by a particular yogurt.  Reviewed HbA1c levels: Lab Results  Component Value Date   HGBA1C 6.7 (A) 01/28/2022   HGBA1C 6.8 (H) 09/09/2021   HGBA1C 6.5 11/26/2020   HGBA1C 6.8 (A) 09/05/2020   HGBA1C 6.6 (A) 05/02/2020   HGBA1C 6.3 (A) 01/09/2019   HGBA1C 5.8 (A) 09/05/2018   HGBA1C 6.3 (A) 04/18/2018   HGBA1C 6.4 12/12/2017   HGBA1C 6.2 02/09/2017   HGBA1C 6.1 01/21/2016   HGBA1C 6.0 (H) 07/08/2012   HGBA1C 6.1 (H) 07/07/2012   HGBA1C 5.8 02/13/2010   HGBA1C 6.0 06/22/2007   HGBA1C 6.0 03/20/2007   She was on: - Glipizide XL 2.5-5 mg (approximately 40 minutes) before breakfast  SGLT2 inh were very expensive.  We had to stop Metformin ER due to significant diarrhea.  We changed to: - Farxiga 10 mg before b'fast  Pt checks her sugars once a day: - am:   101-138 >> 133-167 >> 131-157 >> 132-154 - 2h after b'fast: n/c >> 186, 198 >> 120 >> 134 - before lunch: 130-150 >> 144-184 >> 120, 170  >> n/c - 2h after lunch: n/c >> 133 >> n/c >> 164, 185/225 - before dinner:  107, 140 >> n/c >> 129-132 >> 114-146 - 2h after dinner: n/c >> 127-139 >> n/c >> 184, 184/191 - bedtime: n/c - nighttime: n/c Lowest sugar was 90 ...  >> 113 >> 114; she has  hypoglycemia awareness in the 70s. Highest: 198 >> 184 >> 170 >> 167 >> 225.  Glucometer: One Touch Verio  Pt's meals are: - Breakfast: cereals, yoghurt (Activia), pastries, tea, honey >> f+ greek yoghurt + Kashi cereal - Lunch: salad, leftover sandwich, fruit - Dinner: meat, veggies, potato - Snacks: Sweets, sweet liquor Fredric Mare) >> stopped due to headaches  No CKD, last BUN/creatinine:  Lab Results  Component Value Date   BUN 14 03/08/2022   BUN 14 09/09/2021   CREATININE 0.94 03/08/2022   CREATININE 0.82 09/09/2021   + HL; last set of lipids: Lab Results  Component Value Date   CHOL 178 09/09/2021   HDL 53.90 09/09/2021   LDLCALC 97 09/09/2021   LDLDIRECT 92.0 09/05/2020   TRIG 134.0 09/09/2021   CHOLHDL 3 09/09/2021  She refused statins in the past but we were able to start rosuvastatin 10 mg daily, which she now tolerates well.  In 08/2020  we added fish oil 1 capsule of 1000 mg a day  - Last eye exam was on 09/21/2021: no DR, + cataracts.   - no numbness and tingling in her feet.  Last foot exam 01/2022. On Gabapentin.  She does have burning around her ankles and lower leg due to the leg swelling.  Pt has FH of DM in  aunts.  She has OSA and is on a CPAP.  In summer 2019, she had hAs >> temporal artery Bx negative. She developed anaphylaxis to Vancomycin while in the hospital.  ROS: + see HPI  Past Medical History:  Diagnosis Date   Adjustment disorder with mixed anxiety and depressed mood 03/14/2015   Dr Madaline Guthrie actively treating [place]    ALLERGIC RHINITIS 04/04/2009   Qualifier: Diagnosis of  By: Alwyn Ren MD, Chrissie Noa     Allergy    Anxiety    Dr Madaline Guthrie   Asthma    ASTHMA 04/04/2009   Qualifier: Diagnosis of  By: Alwyn Ren MD, [name]   Status Asthmaticus 10/2004     Atypical seizure (HCC) 06/07/2012   BENIGN POSITIONAL VERTIGO 11/06/2009   Qualifier: Diagnosis of  By: Linford Arnold MD, Catherine     Depression    Dr Madaline Guthrie   Diabetes mellitus    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 08/17/13 seen in the emergency room; imaging negative. No labs. Referred to Dr. Pearletha Forge who recommended nonsteroidals and elevation. 10/18-11/3/14 in [place] for land and river cruise trip.  08/29/13 seen in ER in [place]; topical antibiotics & oral Ciprobay Rxed. 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 Xarelto 15 mg initiated 06/05/12 by Dr Jeraldine Loots , [facility] and increased to one pill twice a day. Despite this she had persistent thrombosis in the cephalic vein in the elbow and the upper forearm areas prompting change to Lovenox 110 mg e   Pulmonary embolism (HCC) 07/09/2012   Presumed based on intermediate VQ 07/07/2012 and high clinical probability.  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 12/17/12 Note: I recommended discussion with [name],Neurologist; & Dr Margaretmary Bayley, Diabetologist    Type 2 diabetes, controlled, with peripheral neuropathy (HCC) 04/04/2009   Qualifier: Diagnosis of  By: Alwyn Ren MD, Chrissie Noa   Dr Margaretmary Bayley, Endo Dr Emily Filbert , Ophth seen anually     Variants of migraine, not elsewhere classified, without mention of intractable migraine without mention of status migrainosus 05/22/2013   Past Surgical History:  Procedure Laterality Date   APPENDECTOMY  ARTERY BIOPSY Left 04/14/2018   Procedure: BIOPSY TEMPORAL ARTERY;  Surgeon: Sherren Kerns, MD;  Location: [facility] OR;  Service: Vascular;  Laterality: Left;   BREAST EXCISIONAL BIOPSY Right 1998   CHOLECYSTECTOMY     COLONOSCOPY  2006   [facility]   rotator cuff surgery     TONSILLECTOMY      Social History   Socioeconomic History   Marital status: Married    Spouse name: Lars Mage   Number of children: 2   Years of education: College   Highest education level: Not on file  Occupational History   Not on file  Tobacco Use   Smoking status: Never   Smokeless tobacco: Never  Substance and Sexual Activity   Alcohol use: Yes    Alcohol/week: 3.0 standard drinks of alcohol    Types: 3 Glasses of wine per week    Comment: 1/2 glass wine daily   Drug use: No   Sexual activity: Never  Other Topics Concern   Not on file  Social History Narrative   Patient is married Lars Mage) and lives at home with her husband.   Patient has two adult children.   Patient is retired.   Patient has a college education.   Patient is right-handed.   Patient drinks one cup of tea daily.   Social Determinants of Health   Financial Resource Strain: Low Risk  (06/09/2021)   Overall Financial Resource Strain (CARDIA)    Difficulty of Paying Living Expenses: Not hard at all  Food Insecurity: No Food Insecurity (06/09/2021)   Hunger  Vital Sign    Worried About Running Out of Food in the Last Year: Never true    Ran Out of Food in the Last Year: Never true  Transportation Needs: No Transportation Needs (06/09/2021)   PRAPARE - Administrator, Civil Service (Medical): No    Lack of Transportation (Non-Medical): No  Physical Activity: Inactive (06/09/2021)   Exercise Vital Sign    Days of Exercise per Week: 0 days    Minutes of Exercise per Session: 0 min  Stress: No Stress Concern Present (06/09/2021)   [employer] of Occupational Health - Occupational Stress Questionnaire    Feeling of Stress : Not at all  Social Connections: Moderately Integrated (06/09/2021)   Social Connection and Isolation Panel [NHANES]    Frequency of Communication with Friends and Family: More than three times a week    Frequency of Social Gatherings with Friends and Family: More than three times a week    Attends Religious Services: Never    Database administrator or Organizations: Yes    Attends Banker Meetings: 1 to 4 times per year    Marital Status: Married  Catering manager Violence: Not At Risk (06/09/2021)   Humiliation, Afraid, Rape, and Kick questionnaire    Fear of Current or Ex-Partner: No    Emotionally Abused: No    Physically Abused: No    Sexually Abused: No   Current Outpatient Medications on File Prior to Visit  Medication Sig Dispense Refill   albuterol (VENTOLIN HFA) 108 (90 Base) MCG/ACT inhaler Inhale 2 puffs into the lungs every 6 (six) hours as needed for wheezing or shortness of breath. 18 g 6   Bacillus Coagulans-Inulin (PROBIOTIC) 1-250 BILLION-MG CAPS Take 1 tablet by mouth daily at 6 (six) AM.     Budeson-Glycopyrrol-Formoterol (BREZTRI AEROSPHERE) 160-9-4.8 MCG/ACT AERO Inhale 2 puffs into the lungs in the morning and at bedtime.  4.8 g 0   budesonide-formoterol (SYMBICORT) 160-4.5 MCG/ACT inhaler Inhale 2 puffs into the lungs 2 (two) times daily. 1 each 12   Cholecalciferol (VITAMIN D)  50 MCG (2000 UT) CAPS Take 1 capsule by mouth daily at 6 (six) AM.     dapagliflozin propanediol (FARXIGA) 10 MG TABS tablet Take 1 tablet (10 mg total) by mouth daily before breakfast. 90 tablet 3   FLUoxetine (PROZAC) 20 MG capsule TAKE 1 CAPSULE BY MOUTH  DAILY 90 capsule 3   gabapentin (NEURONTIN) 100 MG capsule Take 1 capsule (100 mg total) by mouth 3 (three) times daily. (Patient taking differently: Take 100 mg by mouth 2 (two) times daily.) 270 capsule 1   glipiZIDE (GLUCOTROL XL) 2.5 MG 24 hr tablet TAKE 1 TABLET BY MOUTH WITH BREAKFAST 90 tablet 2   loratadine (CLARITIN) 10 MG tablet Take 10 mg by mouth daily.     rizatriptan (MAXALT) 10 MG tablet Take 1 tablet (10 mg total) by mouth as needed for migraine. May repeat in 2 hours if needed 12 tablet 3   rosuvastatin (CRESTOR) 10 MG tablet TAKE 1 TABLET BY MOUTH  DAILY 90 tablet 3   No current facility-administered medications on file prior to visit.   Allergies  Allergen Reactions   Mushroom Ext Cmplx(Shiitake-Reishi-Mait) Anaphylaxis   Shellfish Allergy Anaphylaxis   Sulfonamide Derivatives Anaphylaxis   Vancomycin Anaphylaxis   Fluticasone-Salmeterol Other (See Comments)    Feels like something in the throat   Butalbital-Aspirin-Caffeine Other (See Comments)    UNSPECIFIED REACTION    Montelukast Sodium Other (See Comments)    Feels like she's running out of breath   Penicillins     UNSPECIFIED REACTION      Childhood allergy Has patient had a PCN reaction causing immediate rash, facial/tongue/throat swelling, SOB or lightheadedness with hypotension: Unknown Has patient had a PCN reaction causing severe rash involving mucus membranes or skin necrosis: Unknown Has patient had a PCN reaction that required hospitalization: No Has patient had a PCN reaction occurring within the last 10 years: No If all of the above answers are "NO", then may proceed with Cephalosporin use.     Acetaminophen Other (See Comments)    Stomach  discomfort   Clarithromycin Nausea And Vomiting   Iodinated Contrast Media Other (See Comments)    Bad headache; no prep needed   Levofloxacin Diarrhea and Nausea And Vomiting    Dizziness Believes it was due to a high dosage of this medication   Orphenadrine Nausea And Vomiting   Family History  Problem Relation Age of Onset   Stomach cancer Father    Coronary artery disease Father    Breast cancer Sister 24       breast cancer   Diabetes Maternal Aunt    Diabetes Paternal Aunt    Prostate cancer Brother    Cancer Daughter    Stroke Neg Hx    PE: BP 118/84 (BP Location: Left Arm, Patient Position: Sitting, Cuff Size: Normal)   Pulse 67   Ht 5' (1.524 m)   Wt 278 lb 9.6 oz (126.4 kg)   SpO2 95%   BMI 54.41 kg/m  Wt Readings from Last 3 Encounters:  06/04/22 278 lb 9.6 oz (126.4 kg)  03/08/22 282 lb 6.4 oz (128.1 kg)  01/28/22 283 lb 12.8 oz (128.7 kg)   Constitutional: overweight, in NAD, + using a walker Eyes: EOMI, no exophthalmos ENT: moist mucous membranes, no thyromegaly, no cervical lymphadenopathy Cardiovascular: RRR, No MRG,+  LE edema B Respiratory: CTA B Musculoskeletal: no deformities Skin: moist, warm, + stasis dermatitis bilateral legs Neurological: no tremor with outstretched hands  ASSESSMENT: 1. DM2, non-insulin-dependent, now more controlled, with complications - PN  2. HL  3.  Obesity class III  PLAN:  1. Patient with fairly well-controlled type 2 diabetes, with multiple medication intolerances, but overall good diabetes control.   - Her diabetes regimen is limited by previous side effects and preferences regarding different available options: Metformin IR, and ER - she was taking these inconsistently due to diarrhea and fecal incontinence low-dose glipizide XL - she felt that this was also causing her GI symptoms and stopped it.  SGLT2 inhibitors were too expensive in the past.  I sent a new prescription for these to her pharmacy in 08/2020,  but she was not able to start them due to price. Daughter called the insurance and SGLT2 inhibitors were not covered. She refused injectables including insulin and GLP-1 receptor agonist. Actos may cause some more swelling.   As mentioned above, she could not tolerate Metformin. -We were able to start Farxiga from the patient assistance program and she continues on this.  She tolerates it well. -At last visit, HbA1c was 6.7%.  We did not have to restart glipizide.   -Since last visit, per her meter download in her log, sugars have been slightly higher in the last 2 weeks, but HbA1c is stable. -She mentions stress at home and we discussed that this can definitely increase her blood sugars.  However, for now, I do not feel we need to change her regimen. - I suggested to:  Patient Instructions  Please continue: - Farxiga 10 mg before b'fast  Please return in 4-6 months with your sugar log.  - we checked her HbA1c: 6.7% (stable) - advised to check sugars at different times of the day - 1x a day, rotating check times - advised for yearly eye exams >> she is UTD - return to clinic in 4-6 months  2.  HL -Reviewed latest lipid panel from 09/2021: LDL above target, the rest the fractions at goal: Lab Results  Component Value Date   CHOL 178 09/09/2021   HDL 53.90 09/09/2021   LDLCALC 97 09/09/2021   LDLDIRECT 92.0 09/05/2020   TRIG 134.0 09/09/2021   CHOLHDL 3 09/09/2021  -Previously refused statins but she is currently on 10 mg of Crestor daily, tolerated well.  Also on fish oil 1000 mg daily.  3.  Obesity class III -She is on Farxiga which should also help with weight loss -At last visit she was off and on Farxiga gained 3 pounds before the visit.  I advised her to take it consistently. -She lost 5 pounds since last visit  Carlus Pavlov, MD PhD [facility] Endocrinology

## 2022-06-14 ENCOUNTER — Ambulatory Visit (INDEPENDENT_AMBULATORY_CARE_PROVIDER_SITE_OTHER): Payer: Medicare Other

## 2022-06-14 DIAGNOSIS — Z0000 Encounter for general adult medical examination without abnormal findings: Secondary | ICD-10-CM

## 2022-06-14 NOTE — Progress Notes (Addendum)
Subjective:   Nicole Mccann is a 76 y.o. female who presents for Medicare Annual (Subsequent) preventive examination.  I connected with  Nicole Mccann on 06/14/22 by a audio enabled telemedicine application and verified that I am speaking with the correct person using two identifiers.  Patient Location: Home  Provider Location: Office/Clinic  I discussed the limitations of evaluation and management by telemedicine. The patient expressed understanding and agreed to proceed.   Review of Systems     Cardiac Risk Factors include: advanced age (>4men, >85 women);dyslipidemia;diabetes mellitus     Objective:    There were no vitals filed for this visit. There is no height or weight on file to calculate BMI.     06/14/2022    3:48 PM 06/09/2021    3:52 PM 07/07/2020    4:31 PM 05/22/2020    1:12 PM 04/13/2018   11:14 AM 03/07/2018    9:29 AM 11/09/2017   12:17 PM  Advanced Directives  Does Patient Have a Medical Advance Directive? Yes Yes Yes Yes Yes Yes Yes  Type of Estate agent of [place];Living will Healthcare Power of [place];Living will  Healthcare Power of [place];Living will Healthcare Power of [place];Living will Healthcare Power of [place];Living will Living will  Does patient want to make changes to medical advance directive? No - Patient declined  No - Patient declined No - Patient declined  No - Patient declined   Copy of Healthcare Power of Attorney in Chart? No - copy requested No - copy requested  No - copy requested  No - copy requested     Current Medications (verified) Outpatient Encounter Medications as of 06/14/2022  Medication Sig   albuterol (VENTOLIN HFA) 108 (90 Base) MCG/ACT inhaler Inhale 2 puffs into the lungs every 6 (six) hours as needed for wheezing or shortness of breath.   Bacillus Coagulans-Inulin (PROBIOTIC) 1-250 BILLION-MG CAPS Take 1 tablet by mouth daily at 6 (six) AM.   Budeson-Glycopyrrol-Formoterol (BREZTRI  AEROSPHERE) 160-9-4.8 MCG/ACT AERO Inhale 2 puffs into the lungs in the morning and at bedtime.   budesonide-formoterol (SYMBICORT) 160-4.5 MCG/ACT inhaler Inhale 2 puffs into the lungs 2 (two) times daily.   Cholecalciferol (VITAMIN D) 50 MCG (2000 UT) CAPS Take 1 capsule by mouth daily at 6 (six) AM.   dapagliflozin propanediol (FARXIGA) 10 MG TABS tablet Take 1 tablet (10 mg total) by mouth daily before breakfast.   FLUoxetine (PROZAC) 20 MG capsule TAKE 1 CAPSULE BY MOUTH  DAILY   gabapentin (NEURONTIN) 100 MG capsule Take 1 capsule (100 mg total) by mouth 3 (three) times daily. (Patient taking differently: Take 100 mg by mouth 2 (two) times daily.)   loratadine (CLARITIN) 10 MG tablet Take 10 mg by mouth daily.   rizatriptan (MAXALT) 10 MG tablet Take 1 tablet (10 mg total) by mouth as needed for migraine. May repeat in 2 hours if needed   rosuvastatin (CRESTOR) 10 MG tablet TAKE 1 TABLET BY MOUTH  DAILY   No facility-administered encounter medications on file as of 06/14/2022.    Allergies (verified) Mushroom ext cmplx(shiitake-reishi-mait), Shellfish allergy, Sulfonamide derivatives, Vancomycin, Fluticasone-salmeterol, Butalbital-aspirin-caffeine, Montelukast sodium, Penicillins, Acetaminophen, Clarithromycin, Iodinated contrast media, Levofloxacin, and Orphenadrine   History: Past Medical History:  Diagnosis Date   Adjustment disorder with mixed anxiety and depressed mood 03/14/2015   Dr Madaline Guthrie actively treating [place]    ALLERGIC RHINITIS 04/04/2009   Qualifier: Diagnosis of  By: Alwyn Ren MD, Joesph Fillers  Anxiety    Dr Madaline Guthrie   Asthma    ASTHMA 04/04/2009   Qualifier: Diagnosis of  By: Alwyn Ren MD, [name]   Status Asthmaticus 10/2004     Atypical seizure (HCC) 06/07/2012   BENIGN POSITIONAL VERTIGO 11/06/2009   Qualifier: Diagnosis of  By: Linford Arnold MD, Catherine     Depression    Dr Madaline Guthrie   Diabetes mellitus    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 08/17/13 seen in the emergency room; imaging negative. No labs. Referred to Dr. Pearletha Forge who recommended nonsteroidals and elevation. 10/18-11/3/14 in [place] for land and river cruise trip.  08/29/13 seen in ER in [place]; topical antibiotics & oral Ciprobay Rxed. 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 Xarelto 15 mg initiated 06/05/12 by Dr Jeraldine Loots , [facility] and increased to one pill twice a day. Despite this she had persistent thrombosis in the cephalic vein in the elbow and the upper forearm areas prompting change to Lovenox 110 mg e   Pulmonary embolism (HCC) 07/09/2012   Presumed based on intermediate VQ 07/07/2012 and high clinical probability.  Definitive CT angiogram was not possible due to contrast  intolerance/allergy    Right shoulder pain 04/04/2015   Rotator cuff (capsule) sprain 03/13/2015   Seizure disorder, temporal lobe, intractable (HCC) 10/17/2014   Severe obesity (BMI >= 40) (HCC) 10/17/2014   Shingles 07/2013   Spinal stenosis of lumbar region 12/17/2012   S/P suboptimal response to Chiropractry ;Dr Kai Levins referred her  to Dr Sherwood [facility] [phone]; FAX [phone] MRI 12/15/12 : stenosis  @ L3-4; L 4-5  in context of DDD ESI recommended . 12/17/12 Note: I recommended discussion with [name],Neurologist; & Dr Margaretmary Bayley, Diabetologist    Type 2 diabetes, controlled, with peripheral neuropathy (HCC) 04/04/2009   Qualifier: Diagnosis of  By: Alwyn Ren MD, Chrissie Noa   Dr Margaretmary Bayley, Endo Dr Emily Filbert , Ophth seen anually     Variants of migraine, not elsewhere classified, without mention of intractable migraine without mention of status migrainosus 05/22/2013   Past Surgical History:  Procedure Laterality Date   APPENDECTOMY     ARTERY BIOPSY Left 04/14/2018   Procedure: BIOPSY TEMPORAL ARTERY;  Surgeon: Sherren Kerns, MD;  Location: [facility] OR;  Service: Vascular;  Laterality: Left;   BREAST EXCISIONAL BIOPSY Right 1998   CHOLECYSTECTOMY     COLONOSCOPY  2006   [facility]   rotator cuff surgery     TONSILLECTOMY      Family History  Problem Relation Age of Onset   Stomach cancer Father    Coronary artery disease Father  Breast cancer Sister 14       breast cancer   Diabetes Maternal Aunt    Diabetes Paternal Aunt    Prostate cancer Brother    Cancer Daughter    Stroke Neg Hx    Social History   Socioeconomic History   Marital status: Married    Spouse name: Lars Mage   Number of children: 2   Years of education: College   Highest education level: Not on file  Occupational History   Not on file  Tobacco Use   Smoking status: Never   Smokeless tobacco: Never  Substance and Sexual Activity   Alcohol use: Yes    Alcohol/week: 3.0 standard drinks of alcohol    Types: 3 Glasses of wine per week    Comment: 1/2 glass wine daily   Drug use: No   Sexual activity: Never  Other Topics Concern   Not on file  Social History Narrative   Patient is married Lars Mage) and lives at home with her husband.   Patient has two adult children.   Patient is retired.   Patient has a college education.   Patient is right-handed.   Patient drinks one cup of tea daily.   Social  Determinants of Health   Financial Resource Strain: Low Risk  (06/09/2021)   Overall Financial Resource Strain (CARDIA)    Difficulty of Paying Living Expenses: Not hard at all  Food Insecurity: No Food Insecurity (06/09/2021)   Hunger Vital Sign    Worried About Running Out of Food in the Last Year: Never true    Ran Out of Food in the Last Year: Never true  Transportation Needs: No Transportation Needs (06/09/2021)   PRAPARE - Administrator, Civil Service (Medical): No    Lack of Transportation (Non-Medical): No  Physical Activity: Inactive (06/09/2021)   Exercise Vital Sign    Days of Exercise per Week: 0 days    Minutes of Exercise per Session: 0 min  Stress: No Stress Concern Present (06/09/2021)   [employer] of Occupational Health - Occupational Stress Questionnaire    Feeling of Stress : Not at all  Social Connections: Moderately Integrated (06/09/2021)   Social Connection and Isolation Panel [NHANES]    Frequency of Communication with Friends and Family: More than three times a week    Frequency of Social Gatherings with Friends and Family: More than three times a week    Attends Religious Services: Never    Database administrator or Organizations: Yes    Attends Engineer, structural: 1 to 4 times per year    Marital Status: Married    Tobacco Counseling Counseling given: Not Answered   Clinical Intake:  Pre-visit preparation completed: Yes  Pain : No/denies pain     BMI - recorded: 55.15 Nutritional Status: BMI > 30  Obese Nutritional Risks: Nausea/ vomitting/ diarrhea Diabetes: Yes CBG done?: No Did pt. bring in CBG monitor from home?: No  How often do you need to have someone help you when you read instructions, pamphlets, or other written materials from your doctor or pharmacy?: 1 - Never  Diabetic?yes Nutrition Risk Assessment:  Has the patient had any N/V/D within the last 2 months?  Yes  Does the patient have any non-healing  wounds?  No  Has the patient had any unintentional weight loss or weight gain?  No   Diabetes:  Is the patient diabetic?  Yes  If diabetic, was a CBG obtained today?  No  Did the patient bring in their glucometer from home?  No  How often do you monitor your CBG's? 3 times weekly.   Financial Strains and Diabetes Management:  Are you having any financial strains with the device, your supplies or your medication? No .  Does the patient want to be seen by Chronic Care Management for management of their diabetes?  No  Would the patient like to be referred to a Nutritionist or for Diabetic Management?  No   Diabetic Exams:  Diabetic Eye Exam: Completed 09/21/21 Diabetic Foot Exam: Completed 01/28/22    Interpreter Needed?: No  Information entered by :: Jovonna Nickell   Activities of Daily Living    06/14/2022    3:55 PM  In your present state of health, do you have any difficulty performing the following activities:  Hearing? 0  Vision? 1  Difficulty concentrating or making decisions? 0  Walking or climbing stairs? 1  Dressing or bathing? 0  Doing errands, shopping? 1  Preparing Food and eating ? N  Using the Toilet? N  In the past six months, have you accidently leaked urine? Y  Do you have problems with loss of bowel control? Y  Managing your Medications? Y  Managing your Finances? Y  Housekeeping or managing your Housekeeping? Y    Patient Care Team: Copland, Gwenlyn Found, MD as PCP - General (Family Medicine) Dohmeier, Porfirio Mylar, MD as Consulting Physician (Neurology) Carlus Pavlov, MD as Consulting Physician (Internal Medicine)  Indicate any recent Medical Services you may have received from other than [facility] providers in the past year (date may be approximate).     Assessment:   This is a routine wellness examination for [place].  Hearing/Vision screen No results found.  Dietary issues and exercise activities discussed: Current Exercise Habits: The patient does  not participate in regular exercise at present, Intensity: Mild, Exercise limited by: orthopedic condition(s)   Goals Addressed             This Visit's Progress    DIET - INCREASE WATER INTAKE   On track    Increase physical activity   Not on track      Depression Screen    06/14/2022    3:49 PM 09/09/2021   11:07 AM 06/09/2021    3:57 PM 04/27/2021    4:07 PM 05/22/2020    1:21 PM 03/07/2018    9:31 AM 01/03/2017    3:01 PM  PHQ 2/9 Scores  PHQ - 2 Score 0 0 1 0 0 0 0  PHQ- 9 Score  0         Fall Risk    06/14/2022    3:49 PM 06/09/2021    3:55 PM 04/27/2021    4:07 PM 07/07/2020    4:31 PM 05/22/2020    1:21 PM  Fall Risk   Falls in the past year? 0 0 0 0 1  Number falls in past yr: 0 0 0  0  Injury with Fall? 0 0 0  0  Risk for fall due to : No Fall Risks      Follow up Falls evaluation completed Falls prevention discussed Falls evaluation completed  Education provided;Falls prevention discussed    FALL RISK PREVENTION PERTAINING TO THE HOME:  Any stairs in or around the home? Yes  If so, are there any without handrails? No  Home free of loose throw rugs in walkways, pet beds, electrical cords, etc? No  Adequate lighting in your home  to reduce risk of falls? Yes   ASSISTIVE DEVICES UTILIZED TO PREVENT FALLS:  Life alert? No  Use of a cane, walker or w/c? Yes  Grab bars in the bathroom? Yes  Shower chair or bench in shower? Yes  Elevated toilet seat or a handicapped toilet? No   TIMED UP AND GO:  Was the test performed? No .    Cognitive Function:    01/03/2017    3:08 PM  MMSE - Mini Mental State Exam  Orientation to time 5  Orientation to Place 5  Registration 3  Attention/ Calculation 5  Recall 3  Language- name 2 objects 2  Language- repeat 1  Language- follow 3 step command 3  Language- read & follow direction 1  Write a sentence 1  Copy design 1  Total score 30      06/03/2016    9:38 AM  Montreal Cognitive Assessment   Visuospatial/  Executive (0/5) 5  Naming (0/3) 3  Attention: Read list of digits (0/2) 2  Attention: Read list of letters (0/1) 1  Attention: Serial 7 subtraction starting at 100 (0/3) 3  Language: Repeat phrase (0/2) 1  Language : Fluency (0/1) 0  Abstraction (0/2) 2  Delayed Recall (0/5) 4  Orientation (0/6) 5  Total 26  Adjusted Score (based on education) 26      06/14/2022    4:02 PM  6CIT Screen  What Year? 0 points  What month? 0 points  What time? 0 points  Count back from 20 0 points  Months in reverse 4 points  Repeat phrase 2 points  Total Score 6 points    Immunizations Immunization History  Administered Date(s) Administered   Fluad Quad(high Dose 65+) 08/05/2020   Influenza Split 07/25/2012   Influenza Whole 09/27/2007, 08/21/2008, 09/17/2009   Influenza, High Dose Seasonal PF 09/05/2018   Influenza,inj,Quad PF,6+ Mos 07/23/2015, 08/08/2016   Influenza-Unspecified 09/02/2021   Moderna SARS-COV2 Booster Vaccination 02/11/2021, 02/20/2021   PFIZER(Purple Top)SARS-COV-2 Vaccination 01/01/2020, 01/22/2020, 08/11/2020   Pfizer Covid-19 Vaccine Bivalent Booster 58yrs & up 07/20/2021   Pneumococcal Conjugate-13 03/13/2015   Pneumococcal Polysaccharide-23 05/22/2020   Td 11/08/2001   Tdap 08/17/2013   Zoster Recombinat (Shingrix) 01/14/2022, 04/02/2022    TDAP status: Up to date  Flu Vaccine status: Up to date  Pneumococcal vaccine status: Up to date  Covid-19 vaccine status: Completed vaccines  Qualifies for Shingles Vaccine? Yes   Zostavax completed No   Shingrix Completed?: Yes  Screening Tests Health Maintenance  Topic Date Due   INFLUENZA VACCINE  06/08/2022   OPHTHALMOLOGY EXAM  09/21/2022   HEMOGLOBIN A1C  12/05/2022   FOOT EXAM  01/29/2023   TETANUS/TDAP  08/18/2023   Pneumonia Vaccine 27+ Years old  Completed   DEXA SCAN  Completed   COVID-19 Vaccine  Completed   Hepatitis C Screening  Completed   Zoster Vaccines- Shingrix  Completed   HPV VACCINES   Aged Out    Health Maintenance  Health Maintenance Due  Topic Date Due   INFLUENZA VACCINE  06/08/2022    Colorectal cancer screening: No longer required.   Mammogram status: Completed 02/11/22. Repeat every year  Bone Density status: Completed 07/20/21. Results reflect: Bone density results: OSTEOPOROSIS. Repeat every 2 years.  Lung Cancer Screening: (Low Dose CT Chest recommended if Age 69-80 years, 30 pack-year currently smoking OR have quit w/in 15years.) does not qualify.   Lung Cancer Screening Referral: n/a  Additional Screening:  Hepatitis C Screening: does qualify;  Completed 05/05/16  Vision Screening: Recommended annual ophthalmology exams for early detection of glaucoma and other disorders of the eye. Is the patient up to date with their annual eye exam?  Yes  Who is the provider or what is the name of the office in which the patient attends annual eye exams? Dr. Emily Filbert If pt is not established with a provider, would they like to be referred to a provider to establish care? No .   Dental Screening: Recommended annual dental exams for proper oral hygiene  Community Resource Referral / Chronic Care Management: CRR required this visit?  No   CCM required this visit?  No      Plan:     I have personally reviewed and noted the following in the patient's chart:   Medical and social history Use of alcohol, tobacco or illicit drugs  Current medications and supplements including opioid prescriptions.  Functional ability and status Nutritional status Physical activity Advanced directives List of other physicians Hospitalizations, surgeries, and ER visits in previous 12 months Vitals Screenings to include cognitive, depression, and falls Referrals and appointments  In addition, I have reviewed and discussed with patient certain preventive protocols, quality metrics, and best practice recommendations. A written personalized care plan for preventive services as well  as general preventive health recommendations were provided to patient.   Due to this being a telephonic visit, the after visit summary with patients personalized plan was offered to patient via mail or my-chart.  Patient would like to access on my-chart.  Salomon Mast Larayne Baxley, CMA   06/14/2022   Nurse Notes: none

## 2022-06-14 NOTE — Patient Instructions (Signed)
[name] , Thank you for taking time to come for your Medicare Wellness Visit. I appreciate your ongoing commitment to your health goals. Please review the following plan we discussed and let me know if I can assist you in the future.   Screening recommendations/referrals: Colonoscopy: no longer needed Mammogram: 02/11/22 due 02/12/23 Bone Density: 07/20/21 due 07/21/23 Recommended yearly ophthalmology/optometry visit for glaucoma screening and checkup Recommended yearly dental visit for hygiene and checkup  Vaccinations: Influenza vaccine: up to date Pneumococcal vaccine: up to date Tdap vaccine: up to date Shingles vaccine: up to date   Covid-19:completed  Advanced directives: yes, not on file  Conditions/risks identified: see problem list   Next appointment: Follow up in one year for your annual wellness visit    Preventive Care 65 Years and Older, Female Preventive care refers to lifestyle choices and visits with your health care provider that can promote health and wellness. What does preventive care include? A yearly physical exam. This is also called an annual well check. Dental exams once or twice a year. Routine eye exams. Ask your health care provider how often you should have your eyes checked. Personal lifestyle choices, including: Daily care of your teeth and gums. Regular physical activity. Eating a healthy diet. Avoiding tobacco and drug use. Limiting alcohol use. Practicing safe sex. Taking low-dose aspirin every day. Taking vitamin and mineral supplements as recommended by your health care provider. What happens during an annual well check? The services and screenings done by your health care provider during your annual well check will depend on your age, overall health, lifestyle risk factors, and family history of disease. Counseling  Your health care provider may ask you questions about your: Alcohol use. Tobacco use. Drug use. Emotional  well-being. Home and relationship well-being. Sexual activity. Eating habits. History of falls. Memory and ability to understand (cognition). Work and work Astronomer. Reproductive health. Screening  You may have the following tests or measurements: Height, weight, and BMI. Blood pressure. Lipid and cholesterol levels. These may be checked every 5 years, or more frequently if you are over 6 years old. Skin check. Lung cancer screening. You may have this screening every year starting at age 76 if you have a 30-pack-year history of smoking and currently smoke or have quit within the past 15 years. Fecal occult blood test (FOBT) of the stool. You may have this test every year starting at age 63. Flexible sigmoidoscopy or colonoscopy. You may have a sigmoidoscopy every 5 years or a colonoscopy every 10 years starting at age 21. Hepatitis C blood test. Hepatitis B blood test. Sexually transmitted disease (STD) testing. Diabetes screening. This is done by checking your blood sugar (glucose) after you have not eaten for a while (fasting). You may have this done every 1-3 years. Bone density scan. This is done to screen for osteoporosis. You may have this done starting at age 60. Mammogram. This may be done every 1-2 years. Talk to your health care provider about how often you should have regular mammograms. Talk with your health care provider about your test results, treatment options, and if necessary, the need for more tests. Vaccines  Your health care provider may recommend certain vaccines, such as: Influenza vaccine. This is recommended every year. Tetanus, diphtheria, and acellular pertussis (Tdap, Td) vaccine. You may need a Td booster every 10 years. Zoster vaccine. You may need this after age 44. Pneumococcal 13-valent conjugate (PCV13) vaccine. One dose is recommended after age 34. Pneumococcal polysaccharide (PPSV23)  vaccine. One dose is recommended after age 4. Talk to your  health care provider about which screenings and vaccines you need and how often you need them. This information is not intended to replace advice given to you by your health care provider. Make sure you discuss any questions you have with your health care provider. Document Released: 11/21/2015 Document Revised: 07/14/2016 Document Reviewed: 08/26/2015 Elsevier Interactive Patient Education  2017 [place] Prevention in the Home Falls can cause injuries. They can happen to people of all ages. There are many things you can do to make your home safe and to help prevent falls. What can I do on the outside of my home? Regularly fix the edges of walkways and driveways and fix any cracks. Remove anything that might make you trip as you walk through a door, such as a raised step or threshold. Trim any bushes or trees on the path to your home. Use bright outdoor lighting. Clear any walking paths of anything that might make someone trip, such as rocks or tools. Regularly check to see if handrails are loose or broken. Make sure that both sides of any steps have handrails. Any raised decks and porches should have guardrails on the edges. Have any leaves, snow, or ice cleared regularly. Use sand or salt on walking paths during winter. Clean up any spills in your garage right away. This includes oil or grease spills. What can I do in the bathroom? Use night lights. Install grab bars by the toilet and in the tub and shower. Do not use towel bars as grab bars. Use non-skid mats or decals in the tub or shower. If you need to sit down in the shower, use a plastic, non-slip stool. Keep the floor dry. Clean up any water that spills on the floor as soon as it happens. Remove soap buildup in the tub or shower regularly. Attach bath mats securely with double-sided non-slip rug tape. Do not have throw rugs and other things on the floor that can make you trip. What can I do in the bedroom? Use night  lights. Make sure that you have a light by your bed that is easy to reach. Do not use any sheets or blankets that are too big for your bed. They should not hang down onto the floor. Have a firm chair that has side arms. You can use this for support while you get dressed. Do not have throw rugs and other things on the floor that can make you trip. What can I do in the kitchen? Clean up any spills right away. Avoid walking on wet floors. Keep items that you use a lot in easy-to-reach places. If you need to reach something above you, use a strong step stool that has a grab bar. Keep electrical cords out of the way. Do not use floor polish or wax that makes floors slippery. If you must use wax, use non-skid floor wax. Do not have throw rugs and other things on the floor that can make you trip. What can I do with my stairs? Do not leave any items on the stairs. Make sure that there are handrails on both sides of the stairs and use them. Fix handrails that are broken or loose. Make sure that handrails are as long as the stairways. Check any carpeting to make sure that it is firmly attached to the stairs. Fix any carpet that is loose or worn. Avoid having throw rugs at the top or bottom  of the stairs. If you do have throw rugs, attach them to the floor with carpet tape. Make sure that you have a light switch at the top of the stairs and the bottom of the stairs. If you do not have them, ask someone to add them for you. What else can I do to help prevent falls? Wear shoes that: Do not have high heels. Have rubber bottoms. Are comfortable and fit you well. Are closed at the toe. Do not wear sandals. If you use a stepladder: Make sure that it is fully opened. Do not climb a closed stepladder. Make sure that both sides of the stepladder are locked into place. Ask someone to hold it for you, if possible. Clearly mark and make sure that you can see: Any grab bars or handrails. First and last  steps. Where the edge of each step is. Use tools that help you move around (mobility aids) if they are needed. These include: Canes. Walkers. Scooters. Crutches. Turn on the lights when you go into a dark area. Replace any light bulbs as soon as they burn out. Set up your furniture so you have a clear path. Avoid moving your furniture around. If any of your floors are uneven, fix them. If there are any pets around you, be aware of where they are. Review your medicines with your doctor. Some medicines can make you feel dizzy. This can increase your chance of falling. Ask your doctor what other things that you can do to help prevent falls. This information is not intended to replace advice given to you by your health care provider. Make sure you discuss any questions you have with your health care provider. Document Released: 08/21/2009 Document Revised: 04/01/2016 Document Reviewed: 11/29/2014 Elsevier Interactive Patient Education  2017 [employer].

## 2022-08-10 ENCOUNTER — Ambulatory Visit (INDEPENDENT_AMBULATORY_CARE_PROVIDER_SITE_OTHER): Payer: Medicare Other | Admitting: Neurology

## 2022-08-10 VITALS — BP 116/52 | HR 67 | Ht 60.0 in | Wt 283.0 lb

## 2022-08-10 DIAGNOSIS — E662 Morbid (severe) obesity with alveolar hypoventilation: Secondary | ICD-10-CM

## 2022-08-10 DIAGNOSIS — R269 Unspecified abnormalities of gait and mobility: Secondary | ICD-10-CM

## 2022-08-10 DIAGNOSIS — G4733 Obstructive sleep apnea (adult) (pediatric): Secondary | ICD-10-CM

## 2022-08-10 DIAGNOSIS — Z6841 Body Mass Index (BMI) 40.0 and over, adult: Secondary | ICD-10-CM | POA: Diagnosis not present

## 2022-08-10 DIAGNOSIS — E66813 Morbid (severe) obesity with alveolar hypoventilation: Secondary | ICD-10-CM | POA: Insufficient documentation

## 2022-08-10 NOTE — Progress Notes (Signed)
SLEEP MEDICINE CLINIC    Provider:  Melvyn Novas, MD  Primary Care Physician:  Pearline Cables, MD [address] [place] [address]     Referring Provider: Pearline Cables, Md [address] [place],  [place] [address]          Chief Complaint according to patient   Patient presents with:     New Patient (Initial Visit)           HISTORY OF PRESENT ILLNESS:  JANELE LAGUE is a 76 y.o. year old White or Caucasian female patient seen here as a referral on 08/10/2022 from Dr. Patsy Lager for a re-evaluation of CPAP needs- machine displays an' end of life " notice.  Set up in Spring of 2015.    08-10-2022:  Chief concern according to patient :  Not so much concerned about sleep apnea, but about a recent fall, fell 5 stairs.  Uses an walker outdoors. She remains very overweight.   Her fall went backwards, landed on her buttocks and neck. Her husband came to her aid and fell on her, too. She still has left temporal pain, an indurated artery is palpable, but temporal arteritis was ruled out. It helps to press on the temple.Has sinusitis.   As to her OSA :  Her most recent download from her note 35-year-old CPAP machine shows 100% compliance for days and hours with 7 hours 15 minutes on average, CPAP is an AirSense 10 CPAP with a set pressure of 12 cm water 3 cm expiratory relief, and a residual AHI of 3.1.  She does have high air leakage the 95th percentile of 113 L a minute.  She is using a full facemask which may have to be refitted.  Her fatigue severity score today was endorsed at 59 out of 63 points, and her Epworth sleepiness score at 6 out of 24 possible points so why the patient seems to be well treated for her apnea she is highly fatigued but not so much sleepy.  The geriatric depression score was elevated at 6 out of 15 points which is indicative of some clinical depression.  She is already on an antidepressant but it is certainly  harder to live.  The restricted movement and ambulatory capacity and also because her husband has progressive cognitive changes and is now demented.  For my goal today is to order a home sleep test and also replace his CPAP machine as soon as possible.  And she will get an auto titration device for now with factory settings and then after the home sleep test which will be performed without CPAP she we will try to restrict the settings.  She has done well on 12 cmH2O with 3 cm EPR.  The        HPI: Ms Ho, 1 -year-old pleasant [place] lady returns for followup with her husband, and both are arguing about his health. Marland Kitchen  12-07-2018, Rv for Mrs. Colan without acute concerns -BMI remains high, her mood is good.- but concerned for her husband- cognitive changes. [name] , their daughter , has been concerned, too.  Her CPAP compliance in the treatment of obstructive sleep apnea has been excellent, she has used the machine 30 out of 30 days and 27 of these days over 4 hours, she has an average user time of 6 hours and 2 minutes her minimum pressure is set at 4 and maximum pressure at 12  cmH2O and she has 3 cm expiratory pressure relief. She does have major air leaks but these are also created by her habits to let the machine run when she leaves for the bathroom.   The machine does switch itself off after several minutes.  The 95th percentile pressure is 11 cmH2O and the residual AHI is 2.9.  This is a good resolution with some residual obstructive sleep apnea is only.  The machine could not detect central apneas . She likes nasal pillows, airFit P10 . She sleeps deeper and is more refreshed after CPAP use.     09-2018.She had a negative biopsy for temporal arteritis, and did not need to take steroids.The patient had a very unpleasant surprise after her temporal artery biopsy when she had been brought to the recovery room.  She reports that she was considered ready for discharge, wanted to go to  the bathroom and then her nurse noted that she was severely flushed face chest and back.  She also began wheezing.  It turns out that she was treated with an antibiotic she is allergic to, and she had an anaphylactic reaction.  She was given steroids and Benadryl and an inhaler. 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 She has hair roots growing close to the the incision site, or as she said" embedded".  She recovered from her  Headaches, weaned off Tegretol.    04-12-2018, Rv with urgent appointment [name] reports having a new , sudden attack like headpain or facial pain-   This affects the left temple, feels electric 10 out of 10, and higher in intensity. Sharp eye pain on the left only, feeling her vision has not returnedto normal.  In the same timeframe she felt heat wave coming over her, her stomach felt squizzy, she felt as if having indigestion.  She nearly passed out last Friday.  She admits to not hydrating well, her BP was in normal range and heart rate regular.  HPI: Ms Seim, 48 -year-old pleasant [place] lady returns for followup.     04-12-2018, She had more falls, but none associated with a seizure or olfactory aura.   Continues medication -  She was so dizzy she felt she fall , but she has less headaches and is less fatigued and sleepy on CPAP. She is highly compliant.  The patient is a 97% compliance on her last download 29 out of 30 days was 5 hours and 45 minutes of average use per night she is on an auto set 4-12 cm water pressure with 3 cm EPR her residual AHI is 1.7 which is an excellent result Epworth remains at 10 points fatigue at only 20 points.  Initial visit : 06-28-12 Patient was seen in the EMU at [facility] and diagnosed with an olfactory aura, no EEG abnormalities. 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  The Keppra was well tolerated. She has only once a week any aura,  not daily.  November 21, 2012. Patient developed depression, possibly worseing on Keppra which we weaned off. She is no longer taking Coumadin for the presumed upper extremity DVT (Dr. Azucena Kuba, [facility])  perhaps rather a thrombophlebitis. She was changed to Lamictal and was doing well on 100 mg XR daily in the AM  after a slow titration.  She will progress to 100 mg now.   05/21/13-CM  followup visit today and patient has had  2 auras  since last seen. Her Lamictal dose is currently at 150 daily instead of 200 as Dr. Vickey Huger had ordered. Apparently she did not understand that she was supposed to continue to titrate to 200. She denies any side effects to the medication. He has no new neurologic complaints. She is currently receiving lumbar epidural steroids by Dr. Metta Clines for her low back pain.  05-22-14 - Psychiatrist recommend Wellbutrin, but changed to Zoloft after seizure concerns were discussed. She has better mood on Lamictal,  He was sad " blue " on Keppra.  She reports she was hospitalized in [place] , August 28 2013, after a seizure or syncope on a cruise. Her husband fainted  and needed hospital admission , too. Both Bernacchis spent their [employer] in a hospital in [place]! Her husband is concerned about her loud snoring, she is obese and has been very fatigued. She denies waking up with headaches. Both have witnessed each others apneas.    Interval history from 06/07/2017. I have the pleasure of seeing Mrs. Rauls- she has been and 97% compliance CPAP user with 6 hours and 36 minutes of average user time, using an auto set CPAP between 4 and 12 cm water with 3 cm EPR and her residual AHI of 1.8. There is no evidence of central apneas emerging. She does have some air leaks, but they have not affected her apnea count negatively. If she is comfortable with the current interface I would not necessarily change it. In addition she has advanced to a walker from a cane. Her back  pain has improved the patient had a injection into the lower back by Dr. Modesto Charon, sacroiliac. Dr Metta Clines is her pain doctor.   Psychiatrically improved, Dr Madaline Guthrie,  less depressed, but she discontinued her prescription meds- all of them after she  was very sick following a dental extraction with abcess, she reported, from January- March 2018, as a side effect of antibiotics(.levofloxasin).         06/03/2016    9:38 AM  Montreal Cognitive Assessment   Visuospatial/ Executive (0/5) 5  Naming (0/3) 3  Attention: Read list of digits (0/2) 2  Attention: Read list of letters (0/1) 1  Attention: Serial 7 subtraction starting at 100 (0/3) 3  Language: Repeat phrase (0/2) 1  Language : Fluency (0/1) 0  Abstraction (0/2) 2  Delayed Recall (0/5) 4  Orientation (0/6) 5  Total 26  Adjusted Score (based on education) 26   VS - blood pressure.  Vitals:   08/10/22 1431  BP: (!) 116/52  Pulse: 67     Physical Exam General: She is seated, not  in any evident distress, walks with walker, happy.. smiling and reporting that she has no new concerns.  Head: normocephalic and atraumatic.Oropharynx benign Neck: supple with no carotid  bruits Cardiovascular: regular rate and rhythm, no murmurs Mental Status: Awake and fully alert. Oriented to place and time.  Follows all commands. Speech and language normal.   Cranial Nerves: Fundoscopic exam with evidence of cataract-  Pupils equal, briskly reactive to light.  Extraocular movements full with coarse saccades, not nystagmus. Unable to follow in smooth pursuit. Visual fields - she reports trouble seeing the floor.  Hearing intact and symmetric to finger snap. Facial sensation intact.  Face, tongue, palate move normally and symmetrically.  Neck flexion and extension normal.  Motor: Normal bulk and tone. Normal strength in all tested extremity muscles. No focal weakness, but reports foot drop when her sugar  is low !  Sensory : loss of vibration in toes.  Diabetic neuropathy.  Tingling and pin and needle dyseasthesias.  Coordination: Rapid alternating movements normal in all extremities.  Finger-to-nose performed accurately bilaterally. Gait and Station: Arises from chair with difficulty. Braces herself. Stance is wide-based, she ambulates with assistance but relays on a walker outside of the office.   Reflexes: 2+ and symmetric. Toes downgoing.        I have the pleasure of seeing MARCAYLA BUDGE today, a right -handed White or Caucasian female with a possible sleep disorder.  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 (05/22/2013), Diverticulosis of colon (04/04/2009), Dizziness and giddiness (12/22/2016), DVT (deep venous thrombosis) (HCC), Headache(784.0), Hyperlipidemia, Hypersomnia with sleep apnea, unspecified (05/22/2014), Injury of left knee (08/22/2013), Low back pain (05/26/2016), Low blood pressure, Mixed hyperlipidemia (07/10/2019), 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), Olfactory aura, OSA on CPAP, Osteopenia (09/21/2016), Phlebitis following infusion (06/05/2012), Pulmonary embolism (HCC) (07/09/2012), Right shoulder pain (04/04/2015), Rotator cuff (capsule) sprain (03/13/2015), Seizure disorder, temporal lobe, 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    Social History   Socioeconomic History   Marital status: Married    Spouse name:  Lars Mage   Number of children: 2   Years of education: College   Highest education level: Not on file  Occupational History   Not on file  Tobacco Use   Smoking status: Never   Smokeless tobacco: Never  Substance and Sexual Activity   Alcohol use: Yes    Alcohol/week: 3.0 standard drinks of alcohol    Types: 3 Glasses of wine per week    Comment: 1/2 glass wine daily   Drug use: No   Sexual activity: Never  Other Topics Concern   Not on file  Social History Narrative   Patient is married Lars Mage) and lives at home with her husband.   Patient has two adult children.   Patient is retired.   Patient has a college education.   Patient is right-handed.   Patient drinks one cup of tea daily.   Social Determinants of Health   Financial Resource Strain: Low Risk  (06/09/2021)   Overall Financial Resource Strain (CARDIA)    Difficulty of Paying Living Expenses: Not hard at all  Food Insecurity: No Food Insecurity (06/09/2021)   Hunger Vital Sign    Worried About Running Out of Food in the Last Year: Never true    Ran Out of Food in the Last Year: Never true  Transportation Needs: No Transportation Needs (06/09/2021)   PRAPARE - Administrator, Civil Service (Medical): No    Lack of Transportation (Non-Medical): No  Physical Activity: Inactive (06/09/2021)   Exercise Vital Sign    Days of Exercise per Week: 0 days    Minutes of Exercise per Session: 0 min  Stress: No Stress Concern Present (06/09/2021)   [employer] of Occupational Health - Occupational Stress Questionnaire    Feeling of Stress : Not at all  Social Connections: Moderately Integrated (06/09/2021)   Social Connection and Isolation Panel [NHANES]  Frequency of Communication with Friends and Family: More than three times a week    Frequency of Social Gatherings with Friends and Family: More than three times a week    Attends Religious Services: Never    Database administrator or Organizations: Yes    Attends  Banker Meetings: 1 to 4 times per year    Marital Status: Married    Family History  Problem Relation Age of Onset   Stomach cancer Father    Coronary artery disease Father    Breast cancer Sister 69       breast cancer   Diabetes Maternal Aunt    Diabetes Paternal Aunt    Prostate cancer Brother    Cancer Daughter    Stroke Neg Hx     Past Medical History:  Diagnosis Date   Adjustment disorder with mixed anxiety and depressed mood 03/14/2015   Dr Madaline Guthrie actively treating [place]    ALLERGIC RHINITIS 04/04/2009   Qualifier: Diagnosis of  By: Alwyn Ren MD, Chrissie Noa     Allergy    Anxiety    Dr Madaline Guthrie   Asthma    ASTHMA 04/04/2009   Qualifier: Diagnosis of  By: Alwyn Ren MD, [name]   Status Asthmaticus 10/2004     Atypical seizure (HCC) 06/07/2012   BENIGN POSITIONAL VERTIGO 11/06/2009   Qualifier: Diagnosis of  By: Linford Arnold MD, Catherine     Depression    Dr Madaline Guthrie   Diabetes mellitus    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 08/17/13 seen in the emergency room; imaging negative. No labs. Referred to Dr. Pearletha Forge who recommended nonsteroidals and elevation. 10/18-11/3/14 in [place] for land and river cruise trip.  08/29/13 seen in ER in [place]; topical antibiotics & oral Ciprobay Rxed. 10/23 at second ER I&D declined ; wound   Low back pain 05/26/2016   Low blood pressure    Mixed hyperlipidemia 07/10/2019   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/2014   Olfactory aura    OSA on CPAP    Osteopenia 09/21/2016   dexa 09/2016   Phlebitis following infusion 06/05/2012   Onset  06/01/12;S/P Dilantin infusion 05/30/12 @ [facility] for atypical seizure activity with response 06/05/12 DVT of Cephalic vein elbow- mid upper arm. Xarelto 15 mg initiated 06/05/12 by Dr Jeraldine Loots , [facility] and increased to one pill twice a day. Despite this she had persistent thrombosis in the cephalic vein in the elbow and the upper forearm areas prompting change to Lovenox 110 mg e   Pulmonary embolism (HCC) 07/09/2012   Presumed based on intermediate VQ 07/07/2012 and high clinical probability.  Definitive CT angiogram was not possible due to contrast  intolerance/allergy    Right shoulder pain 04/04/2015   Rotator cuff (capsule) sprain 03/13/2015   Seizure disorder, temporal lobe, intractable (HCC) 10/17/2014   Severe obesity (BMI >= 40) (HCC) 10/17/2014   Shingles 07/2013   Spinal stenosis of lumbar region 12/17/2012   S/P suboptimal response to Chiropractry ;Dr Kai Levins referred her  to Dr Sherwood [facility] [phone]; Valinda Hoar  [id number] MRI 12/15/12 : stenosis @ L3-4; L 4-5  in context of DDD ESI recommended . 12/17/12 Note: I recommended discussion with [name],Neurologist; & Dr Margaretmary Bayley, Diabetologist    Type 2 diabetes, controlled, with peripheral neuropathy (HCC) 04/04/2009   Qualifier: Diagnosis of  By: Alwyn Ren MD, Chrissie Noa   Dr Margaretmary Bayley, Endo Dr Emily Filbert , Ophth seen anually     Variants of migraine, not elsewhere classified, without mention of intractable migraine without mention of status migrainosus 05/22/2013    Past Surgical History:  Procedure Laterality Date   APPENDECTOMY     ARTERY BIOPSY Left 04/14/2018   Procedure: BIOPSY TEMPORAL ARTERY;  Surgeon: Sherren Kerns, MD;  [facility] OR;  Service: Vascular;  Laterality: Left;   BREAST EXCISIONAL BIOPSY Right 1998   CHOLECYSTECTOMY     COLONOSCOPY  2006   [facility]   rotator cuff surgery     TONSILLECTOMY        Current Outpatient Medications on File Prior to Visit  Medication Sig Dispense Refill    albuterol (VENTOLIN HFA) 108 (90 Base) MCG/ACT inhaler Inhale 2 puffs into the lungs every 6 (six) hours as needed for wheezing or shortness of breath. 18 g 6   Bacillus Coagulans-Inulin (PROBIOTIC) 1-250 BILLION-MG CAPS Take 1 tablet by mouth daily at 6 (six) AM.     Budeson-Glycopyrrol-Formoterol (BREZTRI AEROSPHERE) 160-9-4.8 MCG/ACT AERO Inhale 2 puffs into the lungs in the morning and at bedtime. 4.8 g 0   budesonide-formoterol (SYMBICORT) 160-4.5 MCG/ACT inhaler Inhale 2 puffs into the lungs 2 (two) times daily. 1 each 12   Cholecalciferol (VITAMIN D) 50 MCG (2000 UT) CAPS Take 1 capsule by mouth daily at 6 (six) AM.     dapagliflozin propanediol (FARXIGA) 10 MG TABS tablet Take 1 tablet (10 mg total) by mouth daily before breakfast. 90 tablet 3   FLUoxetine (PROZAC) 20 MG capsule TAKE 1 CAPSULE BY MOUTH  DAILY 90 capsule 3   gabapentin (NEURONTIN) 100 MG capsule Take 1 capsule (100 mg total) by mouth 3 (three) times daily. (Patient taking differently: Take 100 mg by mouth 2 (two) times daily.) 270 capsule 1   loratadine (CLARITIN) 10 MG tablet Take 10 mg by mouth daily.     rizatriptan (MAXALT) 10 MG tablet Take 1 tablet (10 mg total) by mouth as needed for migraine. May repeat in 2 hours if needed 12 tablet 3   rosuvastatin (CRESTOR) 10 MG tablet TAKE 1 TABLET BY MOUTH  DAILY 90 tablet 3   No current facility-administered medications on file prior to visit.    Allergies  Allergen Reactions   Mushroom Ext Cmplx(Shiitake-Reishi-Mait) Anaphylaxis   Shellfish Allergy Anaphylaxis   Sulfonamide Derivatives Anaphylaxis   Vancomycin Anaphylaxis   Fluticasone-Salmeterol Other (See Comments)    Feels like something in the throat   Butalbital-Aspirin-Caffeine Other (See Comments)    UNSPECIFIED REACTION    Montelukast Sodium Other (See Comments)    Feels like she's running out of breath   Penicillins     UNSPECIFIED REACTION      Childhood allergy Has patient had a PCN reaction causing  immediate rash, facial/tongue/throat swelling, SOB or lightheadedness with hypotension: Unknown Has patient had a PCN reaction causing severe rash involving mucus membranes or skin necrosis: Unknown Has patient had a PCN reaction that required hospitalization: No Has patient had a PCN reaction occurring within the last 10 years: No If all of the above answers are "NO", then may proceed with Cephalosporin use.  Acetaminophen Other (See Comments)    Stomach discomfort   Clarithromycin Nausea And Vomiting   Iodinated Contrast Media Other (See Comments)    Bad headache; no prep needed   Levofloxacin Diarrhea and Nausea And Vomiting    Dizziness Believes it was due to a high dosage of this medication   Orphenadrine Nausea And Vomiting    Physical exam:  Today's Vitals   08/10/22 1431  BP: (!) 116/52  Pulse: 67  Weight: 283 lb (128.4 kg)  Height: 5' (1.524 m)   Body mass index is 55.27 kg/m.   Wt Readings from Last 3 Encounters:  08/10/22 283 lb (128.4 kg)  06/04/22 278 lb 9.6 oz (126.4 kg)  03/08/22 282 lb 6.4 oz (128.1 kg)     Ht Readings from Last 3 Encounters:  08/10/22 5' (1.524 m)  06/04/22 5' (1.524 m)  03/08/22 5' (1.524 m)      General: The patient is awake, alert and appears not in acute distress. The patient is well groomed. Head: Normocephalic, atraumatic. Neck is supple. Mallampati 3,  neck circumference:18.5 " inches . Nasal airflow  patent.  Retrognathia is not  seen.  Dental status:  biological  Cardiovascular:  Regular rate and cardiac rhythm by pulse,  without distended neck veins. Respiratory: Lungs are clear to auscultation.  Skin:  With evidence of ankle edema.  Trunk: The patient's posture is erect.   Neurologic exam : The patient is awake and alert, oriented to place and time.   Memory subjective described as intact.  Daughter agrees Attention span & concentration ability appears normal.  Speech is fluent,  without  dysarthria, dysphonia or  aphasia.  Mood and affect are appropriate.   Cranial nerves: no loss of smell or taste reported  Pupils are equal and briskly reactive to light. Funduscopic exam deferred.  Extraocular movements in vertical and horizontal planes were intact and without nystagmus. No Diplopia. Visual fields by finger perimetry are intact. Hearing was intact to soft voice and finger rubbing.    Facial sensation intact to fine touch.  Facial motor strength is symmetric and tongue and uvula move midline.  Neck ROM : rotation, tilt and flexion extension were normal for age and shoulder shrug was symmetrical.    Motor exam:  Symmetric bulk, tone -she moved well in upper extremities.  Normal tone without cog -wheeling, symmetric grip strength .   Sensory:  Fine touch and vibration were tested  and  normal.  Proprioception tested in the upper extremities was normal.   Coordination: Rapid alternating movements in the fingers/hands were of normal speed.  The Finger-to-nose maneuver was intact without evidence of ataxia, dysmetria or tremor.   Gait and station: Patient uses a walker - no gait test here.   Deep tendon reflexes: in the upper and lower extremities are symmetric and intact.  Babinski response was deferred         After spending a total time of  30  minutes face to face and additional time for physical and neurologic examination, review of laboratory studies,  personal review of imaging studies, reports and results of other testing and review of referral information / records as far as provided in visit, I have established the following assessments:  1)  CPAP machine is at end of life. HST ordered and new machine will be ordered for now at [place] setting.  2) no  refills needed.  3) gait is  the same for 2-3 years.    My Plan  is to proceed with:  1)HST 2) new CPAP autotitration.    I would like to thank Copland, Gwenlyn Found, MD and Copland, Gwenlyn Found, Md [address] [place],  [place] [address] for allowing me to meet with and to take care of this pleasant patient.   In short, DARETH ANDREW is presenting with need for a new CPAP.    5-15 cm water, 3 cm Water EPR, resMED.   I plan to follow up either personally or through our NP within 2-3 months.   CC: I will share my notes with PCP .  Electronically signed by: Melvyn Novas, MD 08/10/2022 3:34 PM  [facility] and [facility] Sleep Board certified by The [employer] of Sleep Medicine and Diplomate of the [employer] of Sleep Medicine. Board certified In Neurology through the ABPN, Fellow of the [facility]. Medical Director of [employer].       Patient ID: TRISH MANCINELLI, female   DOB: 1946/05/31, 76 y.o.   MRN: [id number]

## 2022-08-11 ENCOUNTER — Telehealth: Payer: Self-pay | Admitting: Neurology

## 2022-08-23 ENCOUNTER — Telehealth: Payer: Self-pay | Admitting: Neurology

## 2022-08-23 NOTE — Telephone Encounter (Signed)
HST- [employer] medicare no auth req.  Patient is scheduled at [facility] for 09/14/22 at 1 pm.  Mailed packet to the patient.

## 2022-09-02 NOTE — Progress Notes (Signed)
[facility] [address], [address] [place], [place] [address] [phone]  Date:  09/08/2022   Name:  Nicole Mccann   DOB:  [birth year]   MRN:  [id number]  PCP:  Pearline Cables, MD    Chief Complaint: 6 month follow up (Concerns/ questions: /Flu shot today: received already /)   History of Present Illness:  Nicole Mccann is a 76 y.o. very pleasant female patient who presents with the following:  6 month recheck today Last visit with myself in May  History of controlled diabetes with polyneuropathy, obesity, hyperlipidemia, seizure disorder, sleep apnea on CPAP, venous insufficiency with swelling of bilateral lower extremities, asthma  At our last visit doing well on fluoxetine 20 mg   Seen by neurology to discuss sleep apnea earelir this month Endocrinology visit in July - A1c 6.7 at that time  Otherwise most recent labs in May  She would like to check on her A1c today   Can update microalbumin Covid booster recommended Flu shot- done already Covid also UTD  They will get RSV done   She notes her belly seems bloated but not painful for the last 2 days She took some OTC meds for gas No diarrhea or vomiting  She did have a fall in August   Wt Readings from Last 3 Encounters:  09/08/22 282 lb 12.8 oz (128.3 kg)  08/10/22 283 lb (128.4 kg)  06/04/22 278 lb 9.6 oz (126.4 kg)      Patient Active Problem List   Diagnosis Date Noted   Class 3 obesity with alveolar hypoventilation, serious comorbidity, and body mass index (BMI) of 50.0 to 59.9 in adult (HCC) 08/10/2022   Gait disorder 08/10/2022   SOB (shortness of breath) 05/12/2021   Diastolic dysfunction 05/12/2021   Chronic diastolic heart failure (HCC) 05/12/2021   Morbid obesity (HCC) 05/12/2021   PSVT (paroxysmal supraventricular tachycardia) 05/12/2021   Hypertriglyceridemia 05/12/2021   Diabetes mellitus due to underlying condition with  hyperosmolarity without coma, without long-term current use of insulin (HCC) 05/12/2021   Olfactory aura    Low blood pressure    Hyperlipidemia    Depression    Anxiety    Allergy    Bilateral headaches 12/09/2020   Mixed hyperlipidemia 07/10/2019   Dizziness and giddiness 12/22/2016   Osteopenia 09/21/2016   Low back pain 05/26/2016   Right shoulder pain 04/04/2015   Adjustment disorder with mixed anxiety and depressed mood 03/14/2015   Rotator cuff (capsule) sprain 03/13/2015   Pseudoptosis 11/18/2014   Seizure disorder, temporal lobe, intractable (HCC) 10/17/2014   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/2014   Severe obesity (BMI >= 40) (HCC) 10/17/2014   OSA on CPAP 10/17/2014   Hypersomnia with sleep apnea, unspecified 05/22/2014   Injury of left knee 08/22/2013   Shingles 07/2013   Variants of migraine, not elsewhere classified, without mention of intractable migraine without mention of status migrainosus 05/22/2013   Disturbances of sensation of smell and taste 05/22/2013   Spinal stenosis of lumbar region 12/17/2012   Pulmonary embolism (HCC) 07/09/2012   DVT (deep venous thrombosis) (HCC) 07/07/2012   Atypical seizure (HCC) 06/07/2012   Phlebitis following infusion 06/05/2012   BENIGN POSITIONAL VERTIGO 11/06/2009   Type 2 diabetes, controlled, with peripheral neuropathy (HCC) 04/04/2009   Allergic rhinitis 04/04/2009   ASTHMA 04/04/2009   Diverticulosis of colon 04/04/2009    Past Medical History:  Diagnosis Date   Adjustment disorder with  mixed anxiety and depressed mood 03/14/2015   Dr Madaline Guthrie actively treating [place]    ALLERGIC RHINITIS 04/04/2009   Qualifier: Diagnosis of  By: Alwyn Ren MD, Chrissie Noa     Allergy    Anxiety    Dr Madaline Guthrie   Asthma    ASTHMA 04/04/2009   Qualifier: Diagnosis of  By: Alwyn Ren MD, [name]   Status Asthmaticus 10/2004     Atypical seizure (HCC) 06/07/2012   BENIGN POSITIONAL VERTIGO 11/06/2009   Qualifier: Diagnosis of  By: Linford Arnold MD,  Catherine     Depression    Dr Madaline Guthrie   Diabetes mellitus    Disturbances of sensation of smell and taste 05/22/2013   Diverticulosis of colon 04/04/2009   Qualifier: Diagnosis of  By: Alwyn Ren MD, Chrissie Noa     Dizziness and giddiness 12/22/2016   DVT (deep venous thrombosis) (HCC)    Headache(784.0)    Hyperlipidemia    Hypersomnia with sleep apnea, unspecified 05/22/2014   Injury of left knee 08/22/2013   [place]15[place]13 [place] contusion and abrasion of left knee. 08/17/13 seen in the emergency room; imaging negative. No labs. Referred to Dr. Pearletha Forge who recommended nonsteroidals and elevation. 10/18-11/3/14 in [place] for land and river cruise trip.  08/29/13 seen in ER in [place]; topical antibiotics & oral Ciprobay Rxed. 10/23 at second ER I&D declined ; wound   Low back pain 05/26/2016   Low blood pressure    Mixed hyperlipidemia 07/10/2019   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/2014   Olfactory aura    OSA on CPAP    Osteopenia 09/21/2016   dexa 09/2016   Phlebitis following infusion 06/05/2012   Onset 06/01/12;S/P Dilantin infusion 05/30/12 @ [facility] for atypical seizure activity with response 06/05/12 DVT of Cephalic vein elbow- mid upper arm. Xarelto 15 mg initiated 06/05/12 by Dr Jeraldine Loots , [facility] and increased to one pill twice a day. Despite this she had persistent thrombosis in the cephalic vein in the elbow and the upper forearm areas prompting change to Lovenox 110 mg e   Pulmonary embolism (HCC) 07/09/2012   Presumed based on intermediate VQ 07/07/2012 and high clinical probability.  Definitive CT angiogram was not possible due to contrast  intolerance/allergy    Right shoulder pain 04/04/2015   Rotator cuff (capsule) sprain 03/13/2015   Seizure disorder, temporal lobe, intractable (HCC) 10/17/2014   Severe obesity (BMI >= 40) (HCC) 10/17/2014   Shingles 07/2013   Spinal stenosis of lumbar region 12/17/2012   S/P suboptimal response to  Chiropractry ;Dr Kai Levins referred her  to Dr Sher[facility] [phone]; FAX [phone] MRI 12/15/12 : stenosis @ L3-4; L 4-5  in context of DDD ESI recommended . 12/17/12 Note: I recommended discussion with [name],Neurologist; & Dr Margaretmary Bayley, Diabetologist    Type 2 diabetes, controlled, with peripheral neuropathy (HCC) 04/04/2009   Qualifier: Diagnosis of  By: Alwyn Ren MD, Chrissie Noa   Dr Margaretmary Bayley, Endo Dr Emily Filbert , Ophth seen anually     Variants of migraine, not elsewhere classified, without mention of intractable migraine without mention of status migrainosus 05/22/2013    Past Surgical History:  Procedure Laterality Date   APPENDECTOMY     ARTERY BIOPSY Left 04/14/2018   Procedure: BIOPSY TEMPORAL ARTERY;  Surgeon: Sherren Kerns, MD;  Location: [facility] OR;  Service: Vascular;  Laterality: Left;   BREAST EXCISIONAL BIOPSY Right 1998   CHOLECYSTECTOMY     COLONOSCOPY  2006   [facility]   rotator cuff surgery     TONSILLECTOMY       Social History   Tobacco Use   Smoking status: Never   Smokeless tobacco: Never  Substance Use Topics   Alcohol use: Yes    Alcohol/week: 3.0 standard drinks of alcohol    Types: 3 Glasses of wine per week    Comment: 1/2 glass wine daily   Drug use: No    Family History  Problem Relation Age of Onset   Stomach cancer Father    Coronary artery disease Father    Breast cancer Sister 72       breast cancer   Diabetes Maternal Aunt    Diabetes Paternal Aunt    Prostate cancer Brother    Cancer Daughter    Stroke Neg Hx     Allergies  Allergen Reactions   Mushroom Ext Cmplx(Shiitake-Reishi-Mait) Anaphylaxis   Shellfish Allergy Anaphylaxis   Sulfonamide Derivatives Anaphylaxis   Vancomycin Anaphylaxis   Fluticasone-Salmeterol Other (See Comments)    Feels like something in the throat   Butalbital-Aspirin-Caffeine Other (See Comments)    UNSPECIFIED REACTION    Montelukast Sodium Other (See Comments)    Feels like  she's running out of breath   Penicillins     UNSPECIFIED REACTION      Childhood allergy Has patient had a PCN reaction causing immediate rash, facial/tongue/throat swelling, SOB or lightheadedness with hypotension: Unknown Has patient had a PCN reaction causing severe rash involving mucus membranes or skin necrosis: Unknown Has patient had a PCN reaction that required hospitalization: No Has patient had a PCN reaction occurring within the last 10 years: No If all of the above answers are "NO", then may proceed with Cephalosporin use.     Acetaminophen Other (See Comments)    Stomach discomfort   Clarithromycin Nausea And Vomiting   Iodinated Contrast Media Other (See Comments)    Bad headache; no prep needed   Levofloxacin Diarrhea and Nausea And Vomiting    Dizziness Believes it was due to a high dosage of this medication   Orphenadrine Nausea And Vomiting    Medication list has been reviewed and updated.  Current Outpatient Medications on File Prior to Visit  Medication Sig Dispense Refill   albuterol (VENTOLIN HFA) 108 (90 Base) MCG/ACT inhaler Inhale 2 puffs into the lungs every 6 (six) hours as needed for wheezing or shortness of breath. 18 g 6   Bacillus Coagulans-Inulin (PROBIOTIC) 1-250 BILLION-MG CAPS Take 1 tablet by mouth daily at 6 (six) AM.     Budeson-Glycopyrrol-Formoterol (BREZTRI AEROSPHERE) 160-9-4.8 MCG/ACT AERO Inhale 2 puffs into the lungs in the morning and at bedtime. 4.8 g 0   budesonide-formoterol (SYMBICORT) 160-4.5 MCG/ACT inhaler Inhale 2 puffs into the lungs 2 (two) times daily. 1 each 12   Cholecalciferol (VITAMIN D) 50 MCG (2000 UT) CAPS Take 1 capsule by mouth daily at 6 (six) AM.     dapagliflozin propanediol (FARXIGA) 10 MG TABS tablet Take 1 tablet (10 mg total) by mouth daily before breakfast. 90 tablet 3   FLUoxetine (PROZAC) 20 MG capsule TAKE 1 CAPSULE BY MOUTH  DAILY 90 capsule 3   gabapentin (NEURONTIN) 100 MG capsule Take 1 capsule (100 mg  total) by mouth 3 (three) times daily. (Patient taking differently: Take 100 mg by mouth 2 (two) times daily.) 270 capsule 1   loratadine (CLARITIN) 10 MG tablet Take 10 mg by mouth daily.     rizatriptan (MAXALT) 10 MG tablet  Take 1 tablet (10 mg total) by mouth as needed for migraine. May repeat in 2 hours if needed 12 tablet 3   rosuvastatin (CRESTOR) 10 MG tablet TAKE 1 TABLET BY MOUTH  DAILY 90 tablet 3   No current facility-administered medications on file prior to visit.    Review of Systems:  As per HPI- otherwise negative.   Physical Examination: Vitals:   09/08/22 1456  BP: 124/80  Pulse: 76  Resp: 18  Temp: 97.6 F (36.4 C)  SpO2: 94%   Vitals:   09/08/22 1456  Weight: 282 lb 12.8 oz (128.3 kg)  Height: 5' (1.524 m)   Body mass index is 55.23 kg/m. Ideal Body Weight: Weight in (lb) to have BMI = 25: 127.7  GEN: no acute distress.  Obese, looks well and her normal self HEENT: Atraumatic, Normocephalic.  Ears and Nose: No external deformity. CV: RRR, No M/G/R. No JVD. No thrill. No extra heart sounds. PULM: CTA B, no wheezes, crackles, rhonchi. No retractions. No resp. distress. No accessory muscle use. ABD: S, NT, ND, +BS. No rebound. No HSM. EXTR: No c/c/e PSYCH: Normally interactive. Conversant.    Assessment and Plan: Mixed hyperlipidemia - Plan: Lipid panel  Type 2 diabetes, controlled, with peripheral neuropathy (HCC) - Plan: Comprehensive metabolic panel, Microalbumin / creatinine urine ratio, Hemoglobin A1c  Depression, recurrent (HCC)  Mild intermittent asthma, unspecified whether complicated  SOB (shortness of breath)  Fatigue, unspecified type - Plan: CBC, TSH  Patient seen today for follow-up.  We will assess her diabetes and hyperlipidemia with lab work as above She is taking fluoxetine, depression is well controlled Shortness of breath/asthma symptoms are stable Discussed immunizations, she plans to get her RSV Will plan further  follow- up pending labs. Recheck in 6 months assuming all is well Signed Abbe Amsterdam, MD  Addendum 11/2, received labs as below.  Message to patient  Results for orders placed or performed in visit on 09/08/22  CBC  Result Value Ref Range   WBC 8.9 4.0 - 10.5 K/uL   RBC 4.81 3.87 - 5.11 Mil/uL   Platelets 188.0 150.0 - 400.0 K/uL   Hemoglobin 14.9 12.0 - 15.0 g/dL   HCT 51.7 00.1 - 74.9 %   MCV 93.6 78.0 - 100.0 fl   MCHC 33.2 30.0 - 36.0 g/dL   RDW 44.9 67.5 - 91.6 %  Comprehensive metabolic panel  Result Value Ref Range   Sodium 140 135 - 145 mEq/L   Potassium 4.4 3.5 - 5.1 mEq/L   Chloride 103 96 - 112 mEq/L   CO2 29 19 - 32 mEq/L   Glucose, Bld 112 (H) 70 - 99 mg/dL   BUN 17 6 - 23 mg/dL   Creatinine, Ser 3.84 0.40 - 1.20 mg/dL   Total Bilirubin 0.4 0.2 - 1.2 mg/dL   Alkaline Phosphatase 53 39 - 117 U/L   AST 18 0 - 37 U/L   ALT 21 0 - 35 U/L   Total Protein 6.6 6.0 - 8.3 g/dL   Albumin 4.1 3.5 - 5.2 g/dL   GFR 66.59 >93.57 mL/min   Calcium 9.6 8.4 - 10.5 mg/dL  Lipid panel  Result Value Ref Range   Cholesterol 182 0 - 200 mg/dL   Triglycerides 017.7 (H) 0.0 - 149.0 mg/dL   HDL 93.90 >30.09 mg/dL   VLDL 23.3 0.0 - 00.7 mg/dL   LDL Cholesterol 89 0 - 99 mg/dL   Total CHOL/HDL Ratio 3    NonHDL 126.43  TSH  Result Value Ref Range   TSH 3.15 0.35 - 5.50 uIU/mL  Microalbumin / creatinine urine ratio  Result Value Ref Range   Microalb, Ur <0.7 0.0 - 1.9 mg/dL   Creatinine,U 40.0 mg/dL   Microalb Creat Ratio 1.8 0.0 - 30.0 mg/g  Hemoglobin A1c  Result Value Ref Range   Hgb A1c MFr Bld 6.9 (H) 4.6 - 6.5 %

## 2022-09-02 NOTE — Patient Instructions (Addendum)
Good to see you today  Please see me in about 6 months assuming all is well  I will be in touch with your labs Plan for RSV

## 2022-09-08 ENCOUNTER — Ambulatory Visit: Payer: Medicare Other | Admitting: Family Medicine

## 2022-09-08 VITALS — BP 124/80 | HR 76 | Temp 97.6°F | Resp 18 | Ht 60.0 in | Wt 282.8 lb

## 2022-09-08 DIAGNOSIS — R0602 Shortness of breath: Secondary | ICD-10-CM

## 2022-09-08 DIAGNOSIS — E782 Mixed hyperlipidemia: Secondary | ICD-10-CM

## 2022-09-08 DIAGNOSIS — R5383 Other fatigue: Secondary | ICD-10-CM

## 2022-09-08 DIAGNOSIS — E1142 Type 2 diabetes mellitus with diabetic polyneuropathy: Secondary | ICD-10-CM | POA: Diagnosis not present

## 2022-09-08 DIAGNOSIS — F339 Major depressive disorder, recurrent, unspecified: Secondary | ICD-10-CM | POA: Diagnosis not present

## 2022-09-08 DIAGNOSIS — J4520 Mild intermittent asthma, uncomplicated: Secondary | ICD-10-CM | POA: Diagnosis not present

## 2022-09-09 ENCOUNTER — Encounter: Payer: Self-pay | Admitting: Family Medicine

## 2022-09-09 LAB — CBC
HCT: 45 % (ref 36.0–46.0)
Hemoglobin: 14.9 g/dL (ref 12.0–15.0)
MCHC: 33.2 g/dL (ref 30.0–36.0)
MCV: 93.6 fl (ref 78.0–100.0)
Platelets: 188 10*3/uL (ref 150.0–400.0)
RBC: 4.81 Mil/uL (ref 3.87–5.11)
RDW: 13.5 % (ref 11.5–15.5)
WBC: 8.9 10*3/uL (ref 4.0–10.5)

## 2022-09-09 LAB — COMPREHENSIVE METABOLIC PANEL
ALT: 21 U/L (ref 0–35)
AST: 18 U/L (ref 0–37)
Albumin: 4.1 g/dL (ref 3.5–5.2)
Alkaline Phosphatase: 53 U/L (ref 39–117)
BUN: 17 mg/dL (ref 6–23)
CO2: 29 mEq/L (ref 19–32)
Calcium: 9.6 mg/dL (ref 8.4–10.5)
Chloride: 103 mEq/L (ref 96–112)
Creatinine, Ser: 0.84 mg/dL (ref 0.40–1.20)
GFR: 66.42 mL/min (ref >60.00)
Glucose, Bld: 112 mg/dL — ABNORMAL HIGH (ref 70–99)
Potassium: 4.4 mEq/L (ref 3.5–5.1)
Sodium: 140 mEq/L (ref 135–145)
Total Bilirubin: 0.4 mg/dL (ref 0.2–1.2)
Total Protein: 6.6 g/dL (ref 6.0–8.3)

## 2022-09-09 LAB — LIPID PANEL
Cholesterol: 182 mg/dL (ref 0–200)
HDL: 55.7 mg/dL (ref >39.00)
LDL Cholesterol: 89 mg/dL (ref 0–99)
NonHDL: 126.43
Total CHOL/HDL Ratio: 3
Triglycerides: 189 mg/dL — ABNORMAL HIGH (ref 0.0–149.0)
VLDL: 37.8 mg/dL (ref 0.0–40.0)

## 2022-09-09 LAB — MICROALBUMIN / CREATININE URINE RATIO
Creatinine,U: 40 mg/dL
Microalb Creat Ratio: 1.8 mg/g (ref 0.0–30.0)
Microalb, Ur: <0.7 mg/dL (ref 0.0–1.9)

## 2022-09-09 LAB — HEMOGLOBIN A1C: Hgb A1c MFr Bld: 6.9 % — ABNORMAL HIGH (ref 4.6–6.5)

## 2022-09-09 LAB — TSH: TSH: 3.15 u[IU]/mL (ref 0.35–5.50)

## 2022-09-14 ENCOUNTER — Ambulatory Visit: Payer: Medicare Other | Admitting: Neurology

## 2022-09-14 DIAGNOSIS — R269 Unspecified abnormalities of gait and mobility: Secondary | ICD-10-CM

## 2022-09-14 DIAGNOSIS — G4733 Obstructive sleep apnea (adult) (pediatric): Secondary | ICD-10-CM

## 2022-09-14 DIAGNOSIS — E662 Morbid (severe) obesity with alveolar hypoventilation: Secondary | ICD-10-CM

## 2022-09-16 NOTE — Progress Notes (Signed)
See procedure note.

## 2022-09-22 ENCOUNTER — Telehealth: Payer: Self-pay | Admitting: Neurology

## 2022-09-22 DIAGNOSIS — G4733 Obstructive sleep apnea (adult) (pediatric): Secondary | ICD-10-CM

## 2022-09-22 NOTE — Procedures (Signed)
[facility]  HOME SLEEP TEST (Watch PAT) REPORT  STUDY DATE: 09/15/2022  DOB: [birth year]  MRN: [id number]  ORDERING CLINICIAN: Huston Foley, MD, PhD   REFERRING CLINICIAN: Copland, Gwenlyn Found, MD (PCP), Dr. Vickey Huger (sleep)  CLINICAL INFORMATION/HISTORY: 76 year old female with an underlying medical history of allergic rhinitis, asthma, vertigo, diabetes, diverticulosis, dizziness, DVT, hyperlipidemia, headaches, anxiety, history of phlebitis, and morbid obesity with a BMI of over 50, who was previously diagnosed with obstructive sleep apnea.  She has been compliant with CPAP therapy but has an older machine.  She presents for re-evaluation, she should be eligible for new equipment.  Epworth sleepiness score: 6/24.  BMI: 55.4 kg/m  FINDINGS:   Sleep Summary:   Total Recording Time (hours, min): 8 hours, 44 min  Total Sleep Time (hours, min):  7 hours, 8 min  Percent REM (%):    22.6%   Respiratory Indices:   Calculated pAHI (per hour):  37.3/hour         REM pAHI:   51.7/hour       NREM pAHI: 33.1/hour  Central pAHI: 0/hour  Oxygen Saturation Statistics:    Oxygen Saturation (%) Mean: 93%   Minimum oxygen saturation (%):                 86%   O2 Saturation Range (%): 86 - 99%    O2 Saturation (minutes) <=88%: 2.3 min  Pulse Rate Statistics:   Pulse Mean (bpm):    68/min    Pulse Range (57 - 102/min)   IMPRESSION: OSA (obstructive sleep apnea)   RECOMMENDATION:  This home sleep test demonstrates severe obstructive sleep apnea with a total AHI of 37.3/hour and O2 nadir of 86%. The Snore and position sensor were not showing signal. Ongoing treatment with positive airway pressure is highly recommended. The patient has been compliant with CPAP of 12 cm with EPR of 3. I will prescribe a new machine. A laboratory attended titration study can be considered in the future for optimization of his treatment and better tolerance of therapy.   Alternative treatment options are limited secondary to the severity of the patient's sleep disordered breathing, but may include surgical treatment with Inspire, hypoglossal nerve stimulator, in carefully selected candidates meeting criteria.  Concomitant weight loss is recommended, where clinically appropriate. Please note, that untreated obstructive sleep apnea may carry additional perioperative morbidity. Patients with significant obstructive sleep apnea should receive perioperative PAP therapy and the surgeons and particularly the anesthesiologist should be informed of the diagnosis and the severity of the sleep disordered breathing. The patient should be cautioned not to drive, work at heights, or operate dangerous or heavy equipment when tired or sleepy. Review and reiteration of good sleep hygiene measures should be pursued with any patient. Other causes of the patient's symptoms, including circadian rhythm disturbances, an underlying mood disorder, medication effect and/or an underlying medical problem cannot be ruled out based on this test. Clinical correlation is recommended.  The patient and her referring provider will be notified of the test results. The patient will be seen in follow up in sleep clinic at [facility].  I certify that I have reviewed the raw data recording prior to the issuance of this report in accordance with the standards of the American Academy of Sleep Medicine (AASM).  INTERPRETING PHYSICIAN:   Huston Foley, MD, PhD  Board Certified in Neurology and Sleep Medicine  [facility] [address], [address] [place], [place] [address] [phone]

## 2022-09-22 NOTE — Telephone Encounter (Signed)
Called and spoke with daughter,on DPR. Relayed results per Dr. Frances Furbish. She verbalized understanding. She states [employer] already provided new CPAP. Just received in the mail. She declined having visit with them to set her up in person. States hard for her d/t work schedule. She is going to call [employer] to let them know she received machine today. Aware resmed airview portal not showing this new machine yet. Once she speaks with them and says things are good to go, she will call us back to make sure we can pull her data.  Scheduled mychart VV with Jessica M,NP 12/20/22 at 3pm.

## 2022-09-22 NOTE — Telephone Encounter (Signed)
Patient had a home sleep test as ordered by Dr. Vickey Huger, I read the study on her behalf. Please advise patient that her sleep test confirms sleep apnea in the severe range.  She has been compliant with the CPAP of 12 cm.  She should be eligible for a new machine.  I placed an order for a new CPAP machine.  Please remind patient to continue to stay compliant with CPAP therapy and she will need to follow-up in sleep clinic to see Dr. Vickey Huger or the nurse practitioner within 30 to 90 days of starting treatment with the new equipment.  We can send the order to her existing DME company.

## 2022-09-23 ENCOUNTER — Ambulatory Visit: Payer: Medicare Other | Admitting: Pulmonary Disease

## 2022-09-23 LAB — HM DIABETES EYE EXAM

## 2022-09-24 ENCOUNTER — Encounter: Payer: Self-pay | Admitting: Internal Medicine

## 2022-09-30 ENCOUNTER — Telehealth: Payer: Medicare Other | Admitting: Physician Assistant

## 2022-09-30 ENCOUNTER — Encounter: Payer: Self-pay | Admitting: Family Medicine

## 2022-09-30 DIAGNOSIS — U071 COVID-19: Secondary | ICD-10-CM

## 2022-09-30 MED ORDER — NIRMATRELVIR/RITONAVIR (PAXLOVID)TABLET: 3.0000 | ORAL_TABLET | Freq: Two times a day (BID) | ORAL | 0 refills | Status: AC

## 2022-09-30 MED ORDER — BENZONATATE 100 MG PO CAPS: 100.0000 mg | ORAL_CAPSULE | Freq: Three times a day (TID) | ORAL | 0 refills | Status: DC | PRN

## 2022-09-30 NOTE — Progress Notes (Signed)
Virtual Visit Consent   Nicole Mccann, you are scheduled for a virtual visit with a [facility] provider today. Just as with appointments in the office, your consent must be obtained to participate. Your consent will be active for this visit and any virtual visit you may have with one of our providers in the next 365 days. If you have a MyChart account, a copy of this consent can be sent to you electronically.  As this is a virtual visit, video technology does not allow for your provider to perform a traditional examination. This may limit your provider's ability to fully assess your condition. If your provider identifies any concerns that need to be evaluated in person or the need to arrange testing (such as labs, EKG, etc.), we will make arrangements to do so. Although advances in technology are sophisticated, we cannot ensure that it will always work on either your end or our end. If the connection with a video visit is poor, the visit may have to be switched to a telephone visit. With either a video or telephone visit, we are not always able to ensure that we have a secure connection.  By engaging in this virtual visit, you consent to the provision of healthcare and authorize for your insurance to be billed (if applicable) for the services provided during this visit. Depending on your insurance coverage, you may receive a charge related to this service.  I need to obtain your verbal consent now. Are you willing to proceed with your visit today? Nicole Mccann has provided verbal consent on 09/30/2022 for a virtual visit (video or telephone). Nicole Mccann, [place]  Date: 09/30/2022 4:27 PM  Virtual Visit via Video Note   I, Nicole Mccann, connected with  TYQUANA HUSTED  ([id number], [id number]) on 09/30/22 at  4:30 PM EST by a video-enabled telemedicine application and verified that I am speaking with the correct person using two identifiers.  Location: Patient: Virtual  Visit Location Patient: Home Provider: Virtual Visit Location Provider: Home Office   I discussed the limitations of evaluation and management by telemedicine and the availability of in person appointments. The patient expressed understanding and agreed to proceed.    History of Present Illness: Nicole Mccann is a 76 y.o. who identifies as a female who was assigned female at birth, and is being seen today for COVID-19. Endorses symptoms starting today with head and chest congestion, cough. Some SOB with exertion creating need to use her rescue inhaler. Notes good response to inhaler. Denies chest pain, fever, GI symptoms. Husband and daughter both COVID positive and she has been trying to keep away from them as much as possible. Due to symptoms, tested x 3 and positive for COVID on each.   HPI: HPI  Problems:  Patient Active Problem List   Diagnosis Date Noted   Class 3 obesity with alveolar hypoventilation, serious comorbidity, and body mass index (BMI) of 50.0 to 59.9 in adult ([place]) 08/10/2022   Gait disorder 08/10/2022   SOB (shortness of breath) [id number]   Diastolic dysfunction Q000111Q   Chronic diastolic heart failure ([place]) 05/12/2021   Morbid obesity ([place]) 05/12/2021   PSVT (paroxysmal supraventricular tachycardia) 05/12/2021   Hypertriglyceridemia 05/12/2021   Diabetes mellitus due to underlying condition with hyperosmolarity without coma, without long-term current use of insulin ([place]) 05/12/2021   Olfactory aura    Low blood pressure    Hyperlipidemia    Depression    Anxiety    Allergy  Bilateral headaches 12/09/2020   Mixed hyperlipidemia 07/10/2019   Dizziness and giddiness 12/22/2016   Osteopenia 09/21/2016   Low back pain 05/26/2016   Right shoulder pain 04/04/2015   Adjustment disorder with mixed anxiety and depressed mood 03/14/2015   Rotator cuff (capsule) sprain 03/13/2015   Pseudoptosis 11/18/2014   Seizure disorder, temporal lobe, intractable (HCC)  10/17/2014   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/2014   Severe obesity (BMI >= 40) (HCC) 10/17/2014   OSA on CPAP 10/17/2014   Hypersomnia with sleep apnea, unspecified 05/22/2014   Injury of left knee 08/22/2013   Shingles 07/2013   Variants of migraine, not elsewhere classified, without mention of intractable migraine without mention of status migrainosus 05/22/2013   Disturbances of sensation of smell and taste 05/22/2013   Spinal stenosis of lumbar region 12/17/2012   Pulmonary embolism (HCC) 07/09/2012   DVT (deep venous thrombosis) (HCC) 07/07/2012   Atypical seizure (HCC) 06/07/2012   Phlebitis following infusion 06/05/2012   BENIGN POSITIONAL VERTIGO 11/06/2009   Type 2 diabetes, controlled, with peripheral neuropathy (HCC) 04/04/2009   Allergic rhinitis 04/04/2009   ASTHMA 04/04/2009   Diverticulosis of colon 04/04/2009    Allergies:  Allergies  Allergen Reactions   Mushroom Ext Cmplx(Shiitake-Reishi-Mait) Anaphylaxis   Shellfish Allergy Anaphylaxis   Sulfonamide Derivatives Anaphylaxis   Vancomycin Anaphylaxis   Fluticasone-Salmeterol Other (See Comments)    Feels like something in the throat   Butalbital-Aspirin-Caffeine Other (See Comments)    UNSPECIFIED REACTION    Montelukast Sodium Other (See Comments)    Feels like she's running out of breath   Penicillins     UNSPECIFIED REACTION      Childhood allergy Has patient had a PCN reaction causing immediate rash, facial/tongue/throat swelling, SOB or lightheadedness with hypotension: Unknown Has patient had a PCN reaction causing severe rash involving mucus membranes or skin necrosis: Unknown Has patient had a PCN reaction that required hospitalization: No Has patient had a PCN reaction occurring within the last 10 years: No If all of the above answers are "NO", then may proceed with Cephalosporin use.     Acetaminophen Other (See Comments)    Stomach discomfort   Clarithromycin Nausea And Vomiting   Iodinated  Contrast Media Other (See Comments)    Bad headache; no prep needed   Levofloxacin Diarrhea and Nausea And Vomiting    Dizziness Believes it was due to a high dosage of this medication   Orphenadrine Nausea And Vomiting   Medications:  Current Outpatient Medications:    albuterol (VENTOLIN HFA) 108 (90 Base) MCG/ACT inhaler, Inhale 2 puffs into the lungs every 6 (six) hours as needed for wheezing or shortness of breath., Disp: 18 g, Rfl: 6   Bacillus Coagulans-Inulin (PROBIOTIC) 1-250 BILLION-MG CAPS, Take 1 tablet by mouth daily at 6 (six) AM., Disp: , Rfl:    Budeson-Glycopyrrol-Formoterol (BREZTRI AEROSPHERE) 160-9-4.8 MCG/ACT AERO, Inhale 2 puffs into the lungs in the morning and at bedtime., Disp: 4.8 g, Rfl: 0   budesonide-formoterol (SYMBICORT) 160-4.5 MCG/ACT inhaler, Inhale 2 puffs into the lungs 2 (two) times daily., Disp: 1 each, Rfl: 12   Cholecalciferol (VITAMIN D) 50 MCG (2000 UT) CAPS, Take 1 capsule by mouth daily at 6 (six) AM., Disp: , Rfl:    dapagliflozin propanediol (FARXIGA) 10 MG TABS tablet, Take 1 tablet (10 mg total) by mouth daily before breakfast., Disp: 90 tablet, Rfl: 3   FLUoxetine (PROZAC) 20 MG capsule, TAKE 1 CAPSULE BY MOUTH  DAILY, Disp: 90 capsule, Rfl: 3  gabapentin (NEURONTIN) 100 MG capsule, Take 1 capsule (100 mg total) by mouth 3 (three) times daily. (Patient taking differently: Take 100 mg by mouth 2 (two) times daily.), Disp: 270 capsule, Rfl: 1   loratadine (CLARITIN) 10 MG tablet, Take 10 mg by mouth daily., Disp: , Rfl:    rizatriptan (MAXALT) 10 MG tablet, Take 1 tablet (10 mg total) by mouth as needed for migraine. May repeat in 2 hours if needed, Disp: 12 tablet, Rfl: 3   rosuvastatin (CRESTOR) 10 MG tablet, TAKE 1 TABLET BY MOUTH  DAILY, Disp: 90 tablet, Rfl: 3  Observations/Objective: Patient is well-developed, well-nourished in no acute distress.  Resting comfortably at home.  Head is normocephalic, atraumatic.  No labored  breathing. Speech is clear and coherent with logical content.  Patient is alert and oriented at baseline.   Assessment and Plan: 1. COVID-19  Patient with multiple risk factors for complicated course of illness. Discussed risks/benefits of antiviral medications including most common potential ADRs. Patient voiced understanding and would like to proceed with antiviral medication. They are candidate for Paxlovid with recent normal renal function and GFR > 60. Will hold crestor while taking antiviral and for 5 additional days. Rx sent to pharmacy. Supportive measures, OTC medications and vitamin regimen reviewed. Tessalon per orders. Continue chronic medications and use of CPAP. Patient has been enrolled in a MyChart COVID symptom monitoring program. Samule Dry reviewed in detail. Strict ER precautions discussed with patient.    Follow Up Instructions: I discussed the assessment and treatment plan with the patient. The patient was provided an opportunity to ask questions and all were answered. The patient agreed with the plan and demonstrated an understanding of the instructions.  A copy of instructions were sent to the patient via MyChart unless otherwise noted below.    The patient was advised to call back or seek an in-person evaluation if the symptoms worsen or if the condition fails to improve as anticipated.  Time:  I spent 10 minutes with the patient via telehealth technology discussing the above problems/concerns.    Nicole Rio, PA-C

## 2022-09-30 NOTE — Patient Instructions (Addendum)
Nicole Mccann, thank you for joining [name], PA-C for today's virtual visit.  While this provider is not your primary care provider (PCP), if your PCP is located in our provider database this encounter information will be shared with them immediately following your visit.   [facility] account gives you access to today's visit and all your visits, tests, and labs performed at [facility] " click here if you don't have a [facility] account or go to [email or web address]  Consent: (Patient) Nicole Mccann provided verbal consent for this virtual visit at the beginning of the encounter.  Current Medications:  Current Outpatient Medications:    albuterol (VENTOLIN HFA) 108 (90 Base) MCG/ACT inhaler, Inhale 2 puffs into the lungs every 6 (six) hours as needed for wheezing or shortness of breath., Disp: 18 g, Rfl: 6   Bacillus Coagulans-Inulin (PROBIOTIC) 1-250 BILLION-MG CAPS, Take 1 tablet by mouth daily at 6 (six) AM., Disp: , Rfl:    Budeson-Glycopyrrol-Formoterol (BREZTRI AEROSPHERE) 160-9-4.8 MCG/ACT AERO, Inhale 2 puffs into the lungs in the morning and at bedtime., Disp: 4.8 g, Rfl: 0   budesonide-formoterol (SYMBICORT) 160-4.5 MCG/ACT inhaler, Inhale 2 puffs into the lungs 2 (two) times daily., Disp: 1 each, Rfl: 12   Cholecalciferol (VITAMIN D) 50 MCG (2000 UT) CAPS, Take 1 capsule by mouth daily at 6 (six) AM., Disp: , Rfl:    dapagliflozin propanediol (FARXIGA) 10 MG TABS tablet, Take 1 tablet (10 mg total) by mouth daily before breakfast., Disp: 90 tablet, Rfl: 3   FLUoxetine (PROZAC) 20 MG capsule, TAKE 1 CAPSULE BY MOUTH  DAILY, Disp: 90 capsule, Rfl: 3   gabapentin (NEURONTIN) 100 MG capsule, Take 1 capsule (100 mg total) by mouth 3 (three) times daily. (Patient taking differently: Take 100 mg by mouth 2 (two) times daily.), Disp: 270 capsule, Rfl: 1   loratadine (CLARITIN) 10 MG tablet, Take 10 mg by mouth daily., Disp: , Rfl:     rizatriptan (MAXALT) 10 MG tablet, Take 1 tablet (10 mg total) by mouth as needed for migraine. May repeat in 2 hours if needed, Disp: 12 tablet, Rfl: 3   rosuvastatin (CRESTOR) 10 MG tablet, TAKE 1 TABLET BY MOUTH  DAILY, Disp: 90 tablet, Rfl: 3   Medications ordered in this encounter:  No orders of the defined types were placed in this encounter.   *If you need refills on other medications prior to your next appointment, please contact your pharmacy*  Follow-Up: Call back or seek an in-person evaluation if the symptoms worsen or if the condition fails to improve as anticipated.  [facility] [phone]  Other Instructions Please keep well-hydrated and get plenty of rest. Start a saline nasal rinse to flush out your nasal passages. You can use plain Mucinex to help thin congestion. If you have a humidifier, running in the bedroom at night. I want you to start OTC vitamin D3 1000 units daily, vitamin C 1000 mg daily, and a zinc supplement. Please take prescribed medications as directed.  You have been enrolled in a MyChart symptom monitoring program. Please answer these questions daily so we can keep track of how you are doing.  You were to quarantine for 5 days from onset of your symptoms.  After day 5, if you have had no fever and you are feeling better, you can end quarantine but need to mask for an additional 5 days. After day 5 if you have a fever or are having  significant symptoms, please quarantine for full 10 days.  If you note any worsening of symptoms, any significant shortness of breath or any chest pain, please seek ER evaluation ASAP.  Please do not delay care!  COVID-19: What to Do if You Are Sick If you test positive and are an older adult or someone who is at high risk of getting very sick from COVID-19, treatment may be available. Contact a healthcare provider right away after a positive test to determine if you are eligible, even if your symptoms are  mild right now. You can also visit a Test to Treat location and, if eligible, receive a prescription from a provider. Don't delay: Treatment must be started within the first few days to be effective. If you have a fever, cough, or other symptoms, you might have COVID-19. Most people have mild illness and are able to recover at home. If you are sick: Keep track of your symptoms. If you have an emergency warning sign (including trouble breathing), call 911. Steps to help prevent the spread of COVID-19 if you are sick If you are sick with COVID-19 or think you might have COVID-19, follow the steps below to care for yourself and to help protect other people in your home and community. Stay home except to get medical care Stay home. Most people with COVID-19 have mild illness and can recover at home without medical care. Do not leave your home, except to get medical care. Do not visit public areas and do not go to places where you are unable to wear a mask. Take care of yourself. Get rest and stay hydrated. Take over-the-counter medicines, such as acetaminophen, to help you feel better. Stay in touch with your doctor. Call before you get medical care. Be sure to get care if you have trouble breathing, or have any other emergency warning signs, or if you think it is an emergency. Avoid public transportation, ride-sharing, or taxis if possible. Get tested If you have symptoms of COVID-19, get tested. While waiting for test results, stay away from others, including staying apart from those living in your household. Get tested as soon as possible after your symptoms start. Treatments may be available for people with COVID-19 who are at risk for becoming very sick. Don't delay: Treatment must be started early to be effective--some treatments must begin within 5 days of your first symptoms. Contact your healthcare provider right away if your test result is positive to determine if you are eligible. Self-tests are  one of several options for testing for the virus that causes COVID-19 and may be more convenient than laboratory-based tests and point-of-care tests. Ask your healthcare provider or your local health department if you need help interpreting your test results. You can visit your state, tribal, local, and territorial health department's website to look for the latest local information on testing sites. Separate yourself from other people As much as possible, stay in a specific room and away from other people and pets in your home. If possible, you should use a separate bathroom. If you need to be around other people or animals in or outside of the home, wear a well-fitting mask. Tell your close contacts that they may have been exposed to COVID-19. An infected person can spread COVID-19 starting 48 hours (or 2 days) before the person has any symptoms or tests positive. By letting your close contacts know they may have been exposed to COVID-19, you are helping to protect everyone. See COVID-19 and Animals  if you have questions about pets. If you are diagnosed with COVID-19, someone from the health department may call you. Answer the call to slow the spread. Monitor your symptoms Symptoms of COVID-19 include fever, cough, or other symptoms. Follow care instructions from your healthcare provider and local health department. Your local health authorities may give instructions on checking your symptoms and reporting information. When to seek emergency medical attention Look for emergency warning signs* for COVID-19. If someone is showing any of these signs, seek emergency medical care immediately: Trouble breathing Persistent pain or pressure in the chest New confusion Inability to wake or stay awake Pale, gray, or blue-colored skin, lips, or nail beds, depending on skin tone *This list is not all possible symptoms. Please call your medical provider for any other symptoms that are severe or concerning to  you. Call 911 or call ahead to your local emergency facility: Notify the operator that you are seeking care for someone who has or may have COVID-19. Call ahead before visiting your doctor Call ahead. Many medical visits for routine care are being postponed or done by phone or telemedicine. If you have a medical appointment that cannot be postponed, call your doctor's office, and tell them you have or may have COVID-19. This will help the office protect themselves and other patients. If you are sick, wear a well-fitting mask You should wear a mask if you must be around other people or animals, including pets (even at home). Wear a mask with the best fit, protection, and comfort for you. You don't need to wear the mask if you are alone. If you can't put on a mask (because of trouble breathing, for example), cover your coughs and sneezes in some other way. Try to stay at least 6 feet away from other people. This will help protect the people around you. Masks should not be placed on young children under age 73 years, anyone who has trouble breathing, or anyone who is not able to remove the mask without help. Cover your coughs and sneezes Cover your mouth and nose with a tissue when you cough or sneeze. Throw away used tissues in a lined trash can. Immediately wash your hands with soap and water for at least 20 seconds. If soap and water are not available, clean your hands with an alcohol-based hand sanitizer that contains at least 60% alcohol. Clean your hands often Wash your hands often with soap and water for at least 20 seconds. This is especially important after blowing your nose, coughing, or sneezing; going to the bathroom; and before eating or preparing food. Use hand sanitizer if soap and water are not available. Use an alcohol-based hand sanitizer with at least 60% alcohol, covering all surfaces of your hands and rubbing them together until they feel dry. Soap and water are the best option,  especially if hands are visibly dirty. Avoid touching your eyes, nose, and mouth with unwashed hands. Handwashing Tips Avoid sharing personal household items Do not share dishes, drinking glasses, cups, eating utensils, towels, or bedding with other people in your home. Wash these items thoroughly after using them with soap and water or put in the dishwasher. Clean surfaces in your home regularly Clean and disinfect high-touch surfaces (for example, doorknobs, tables, handles, light switches, and countertops) in your "sick room" and bathroom. In shared spaces, you should clean and disinfect surfaces and items after each use by the person who is ill. If you are sick and cannot clean, a caregiver or  other person should only clean and disinfect the area around you (such as your bedroom and bathroom) on an as needed basis. Your caregiver/other person should wait as long as possible (at least several hours) and wear a mask before entering, cleaning, and disinfecting shared spaces that you use. Clean and disinfect areas that may have blood, stool, or body fluids on them. Use household cleaners and disinfectants. Clean visible dirty surfaces with household cleaners containing soap or detergent. Then, use a household disinfectant. Use a product from [employer] List N: Disinfectants for Coronavirus (COVID-19). Be sure to follow the instructions on the label to ensure safe and effective use of the product. Many products recommend keeping the surface wet with a disinfectant for a certain period of time (look at "contact time" on the product label). You may also need to wear personal protective equipment, such as gloves, depending on the directions on the product label. Immediately after disinfecting, wash your hands with soap and water for 20 seconds. For completed guidance on cleaning and disinfecting your home, visit Complete Disinfection Guidance. Take steps to improve ventilation at home Improve ventilation  (air flow) at home to help prevent from spreading COVID-19 to other people in your household. Clear out COVID-19 virus particles in the air by opening windows, using air filters, and turning on fans in your home. Use this interactive tool to learn how to improve air flow in your home. When you can be around others after being sick with COVID-19 Deciding when you can be around others is different for different situations. Find out when you can safely end home isolation. For any additional questions about your care, contact your healthcare provider or state or local health department. 01/27/2021 Content source: [facility] for Immunization and Respiratory Diseases ([facility]), Division of Viral Diseases This information is not intended to replace advice given to you by your health care provider. Make sure you discuss any questions you have with your health care provider. Document Revised: 03/12/2021 Document Reviewed: 03/12/2021 Elsevier Patient Education  2022 [employer].   If you have been instructed to have an in-person evaluation today at a local Urgent Care facility, please use the link below. It will take you to a list of all of our available [facility], including address, phone number and hours of operation. Please do not delay care.  [facility]  If you or a family member do not have a primary care provider, use the link below to schedule a visit and establish care. When you choose a [facility] primary care physician or advanced practice provider, you gain a long-term partner in health. Find a Primary Care Provider  Learn more about [facility] in-office and virtual care options: [facility] - Get Care Now

## 2022-10-02 ENCOUNTER — Encounter (INDEPENDENT_AMBULATORY_CARE_PROVIDER_SITE_OTHER): Payer: Self-pay

## 2022-10-03 ENCOUNTER — Other Ambulatory Visit: Payer: Self-pay | Admitting: Family Medicine

## 2022-10-03 DIAGNOSIS — E1142 Type 2 diabetes mellitus with diabetic polyneuropathy: Secondary | ICD-10-CM

## 2022-10-04 ENCOUNTER — Telehealth: Payer: Self-pay

## 2022-10-04 NOTE — Telephone Encounter (Signed)
Called, spoke with patient daughter about patient's symptoms of cough and appetite. Patient's daughter states that the patient's cough is better and she only had a cough one day. Also, her appetite and stomach discomfort maybe be caused by the medication prescribed for COVID, Paxlovid. Advised her for my chart companion.   Advise given:  If appetite becomes worse:   encourage patient to drink fluids as tolerated, work their way up to bland solid food such as crackers, pretzels, soup, bread or applesauce and boiled starches

## 2022-10-11 ENCOUNTER — Encounter: Payer: Self-pay | Admitting: Family Medicine

## 2022-10-11 ENCOUNTER — Telehealth: Payer: Self-pay

## 2022-10-11 NOTE — Telephone Encounter (Signed)
Called, spoke with patient's daughter Nicole Mccann) in regard to the COVID survey filled out in [place] for symptoms of SOB, weakness, and appetite. She states that the patient has a lot of nasal congestion and headache. She tested positive for COVID. Advise patient from my chart companion. Also advised to follow up with PCP,if symptoms persist. Patient daughter verb understanding.

## 2022-11-15 ENCOUNTER — Encounter: Payer: Self-pay | Admitting: Pulmonary Disease

## 2022-11-15 ENCOUNTER — Ambulatory Visit (INDEPENDENT_AMBULATORY_CARE_PROVIDER_SITE_OTHER): Payer: Medicare Other | Admitting: Pulmonary Disease

## 2022-11-15 VITALS — BP 118/76 | HR 73 | Ht 60.0 in | Wt 280.0 lb

## 2022-11-15 DIAGNOSIS — G4733 Obstructive sleep apnea (adult) (pediatric): Secondary | ICD-10-CM

## 2022-11-15 DIAGNOSIS — J4540 Moderate persistent asthma, uncomplicated: Secondary | ICD-10-CM | POA: Diagnosis not present

## 2022-11-15 NOTE — Progress Notes (Signed)
@Patient  ID: , female    DOB: 09/14/1946, 77 y.o.   MRN: [id number]  Chief Complaint  Patient presents with   Follow-up    Symbicort going generic would like to know if there's something else that could be prescribed from az&me Covid thanksgiving    Referring provider: 08-17-1972, MD  HPI:   77 y.o. woman whom we are seeing in follow up for evaluation of dyspnea on exertion related to poorly controlled asthma. Symptoms improved with alteration in maintenance inhaler.  Patient is overdue for follow-up.  She states overall things are doing okay.  Using albuterol about 3 times a week.  Maybe a little bit less over the last couple months.  Curiously little bit less since she contracted COVID around Thanksgiving 2023.  Able to stay at home.  History of Paxlovid.  She also recently got a new CPAP machine.  She is tolerating this well.  Using every night.  No issues.  HPI at initial visit: Patient with longstanding history of asthma.  At 1 point time was on fluticasone and salmeterol.  She had adverse events of this, like something stuck in the back of her throat, globus.  This was discontinued.  She struggled dyspnea exertion worsening over the last several months.  Worse with inclines or stairs.  Present on flat surfaces as well.  No time of day when things are better or worse.  No position where things are better or worse.  No seasonal environmental factors make things better or worse.  She has OSA and reports good adherence to CPAP.  She admits to weight gain as well over the last couple of years.  She has been trialed on diuretics per her cardiologist and this does temporarily help her dyspnea.  Most recent chest x-ray 03/04/2021 reviewed interpreted as poor inspiration, linear atelectasis in the bases, otherwise clear.  Most recent TTE 04/2021 reviewed which demonstrates grade 1 diastolic dysfunction, elevated left atrial pressure, trivial mitral regurg, normal LA size,  normal RV size and function.  PMH: Diabetes, headaches, asthma Surgical history: Appendectomy, cholecystectomy, rotator cuff surgery Family history: Father with CAD, stomach cancer, sister with breast cancer Social history: She is a never smoker, lives in [place] / Pulmonary Flowsheets:   ACT:      No data to display          MMRC:     No data to display          Epworth:      No data to display          Tests:   FENO:  Lab Results  Component Value Date   NITRICOXIDE 18 12/22/2016    PFT:     No data to display          WALK:     12/22/2016   11:27 AM  SIX MIN WALK  Tech Comments: Pt walked at a steady pace but comlpained of leg pain during walk, 2 laps TA/CMA    Imaging: Personally reviewed as per EMR discussion of this note  Lab Results: Personally reviewed CBC    Component Value Date/Time   WBC 8.9 09/08/2022 1528   RBC 4.81 09/08/2022 1528   HGB 14.9 09/08/2022 1528   HCT 45.0 09/08/2022 1528   PLT 188.0 09/08/2022 1528   MCV 93.6 09/08/2022 1528   MCH 30.6 11/09/2017 1447   MCHC 33.2 09/08/2022 1528   RDW 13.5 09/08/2022 1528   LYMPHSABS 2.5  11/09/2017 1447   MONOABS 1.0 11/09/2017 1447   EOSABS 0.3 11/09/2017 1447   BASOSABS 0.0 11/09/2017 1447    BMET    Component Value Date/Time   NA 140 09/08/2022 1528   NA 141 01/21/2016 0000   K 4.4 09/08/2022 1528   CL 103 09/08/2022 1528   CO2 29 09/08/2022 1528   GLUCOSE 112 (H) 09/08/2022 1528   BUN 17 09/08/2022 1528   BUN 16 01/21/2016 0000   CREATININE 0.84 09/08/2022 1528   CALCIUM 9.6 09/08/2022 1528   GFRNONAA >60 11/09/2017 1447   GFRAA >60 11/09/2017 1447    BNP No results found for: "BNP"  ProBNP    Component Value Date/Time   PROBNP 50.0 03/04/2021 1141    Specialty Problems       Pulmonary Problems   Allergic rhinitis    Qualifier: Diagnosis of  By: [name] MD, [name]        OSA on CPAP   SOB (shortness of breath)   Class 3  obesity with alveolar hypoventilation, serious comorbidity, and body mass index (BMI) of 50.0 to 59.9 in adult (HCC)    Allergies  Allergen Reactions   Mushroom Ext Cmplx(Shiitake-Reishi-Mait) Anaphylaxis   Shellfish Allergy Anaphylaxis   Sulfonamide Derivatives Anaphylaxis   Vancomycin Anaphylaxis   Fluticasone-Salmeterol Other (See Comments)    Feels like something in the throat   Butalbital-Aspirin-Caffeine Other (See Comments)    UNSPECIFIED REACTION    Montelukast Sodium Other (See Comments)    Feels like she's running out of breath   Penicillins     UNSPECIFIED REACTION      Childhood allergy Has patient had a PCN reaction causing immediate rash, facial/tongue/throat swelling, SOB or lightheadedness with hypotension: Unknown Has patient had a PCN reaction causing severe rash involving mucus membranes or skin necrosis: Unknown Has patient had a PCN reaction that required hospitalization: No Has patient had a PCN reaction occurring within the last 10 years: No If all of the above answers are "NO", then may proceed with Cephalosporin use.     Acetaminophen Other (See Comments)    Stomach discomfort   Clarithromycin Nausea And Vomiting   Iodinated Contrast Media Other (See Comments)    Bad headache; no prep needed   Levofloxacin Diarrhea and Nausea And Vomiting    Dizziness Believes it was due to a high dosage of this medication   Orphenadrine Nausea And Vomiting    Immunization History  Administered Date(s) Administered   Fluad Quad(high Dose 65+) 08/05/2020   Influenza Split 07/25/2012, 08/04/2022   Influenza Whole 09/27/2007, 08/21/2008, 09/17/2009   Influenza, High Dose Seasonal PF 09/05/2018   Influenza,inj,Quad PF,6+ Mos 07/23/2015, 08/08/2016   Influenza-Unspecified 09/02/2021   Moderna SARS-COV2 Booster Vaccination 02/11/2021, 02/20/2021   PFIZER(Purple Top)SARS-COV-2 Vaccination 01/01/2020, 01/22/2020, 08/11/2020   Pfizer Covid-19 Vaccine Bivalent Booster 48yrs  & up 07/20/2021   Pneumococcal Conjugate-13 03/13/2015   Pneumococcal Polysaccharide-23 05/22/2020   Td 11/08/2001   Tdap 08/17/2013   Unspecified SARS-COV-2 Vaccination 08/04/2022   Zoster Recombinat (Shingrix) 01/14/2022, 04/02/2022    Past Medical History:  Diagnosis Date   Adjustment disorder with mixed anxiety and depressed mood 03/14/2015   Dr Albertine Patricia actively treating [place] 04/04/2009   Qualifier: Diagnosis of  By: [name] MD, [name]     Allergy    Anxiety    Dr Albertine Patricia   Asthma    ASTHMA 04/04/2009   Qualifier: Diagnosis of  By: [name] MD, [place]  10/2004     Atypical seizure ([place]) 06/07/2012   BENIGN POSITIONAL VERTIGO 11/06/2009   Qualifier: Diagnosis of  By: [name] MD, Catherine     Depression    Dr Albertine Patricia   Diabetes mellitus    Disturbances of sensation of smell and taste 05/22/2013   Diverticulosis of colon 04/04/2009   Qualifier: Diagnosis of  By: [name] MD, [name]     Dizziness and giddiness 12/22/2016   DVT (deep venous thrombosis) (HCC)    Headache(784.0)    Hyperlipidemia    Hypersomnia with sleep apnea, unspecified 05/22/2014   Injury of left knee 08/22/2013   08[place]15[place]13 mechanical [place] sustaining contusion and abrasion of left knee. 08/17/13 seen in the emergency room; imaging negative. No labs. Referred to Dr. [name] who recommended nonsteroidals and elevation. 10/18-11/3/14 in [place] for land and river cruise trip.  08/29/13 seen in ER in [place]; topical antibiotics & oral Ciprobay Rxed. 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 Xarelto 15 mg initiated 06/05/12 by Dr [name] , [place] and increased to one pill twice a day. Despite this she had persistent thrombosis in the cephalic vein in the elbow and the upper forearm areas prompting change to Lovenox 110 mg e   Pulmonary embolism ([place]) 07/09/2012   Presumed based on intermediate VQ 07/07/2012 and high clinical probability.  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 12/17/12 Note: I recommended discussion with [name],Neurologist; & [name], Diabetologist    Type 2 diabetes, controlled, with peripheral neuropathy ([place]) 04/04/2009   Qualifier: Diagnosis of  By: [name] MD, [name]   [name], Endo [name] , Ophth seen anually     Variants of migraine, not elsewhere classified, without mention of intractable migraine without mention of status migrainosus 05/22/2013    Tobacco History: Social History   Tobacco Use  Smoking Status Never  Smokeless Tobacco Never   Counseling given: Not Answered   Continue to not smoke  Outpatient Encounter Medications as of 11/15/2022  Medication Sig   albuterol (VENTOLIN HFA) 108 (90 Base) MCG/ACT inhaler Inhale 2 puffs into the lungs every 6 (six) hours as needed for wheezing or shortness of breath.   Bacillus Coagulans-Inulin (PROBIOTIC) 1-250 BILLION-MG CAPS Take 1 tablet by mouth daily at 6 (six) AM.   budesonide-formoterol (SYMBICORT) 160-4.5 MCG/ACT inhaler Inhale 2 puffs into the lungs 2 (two)  times daily.   Cholecalciferol (VITAMIN D) 50 MCG (2000 UT) CAPS Take 1 capsule by mouth daily at 6 (six) AM.   dapagliflozin propanediol (  FARXIGA) 10 MG TABS tablet Take 1 tablet (10 mg total) by mouth daily before breakfast.   FLUoxetine (PROZAC) 20 MG capsule TAKE 1 CAPSULE BY MOUTH  DAILY   gabapentin (NEURONTIN) 100 MG capsule Take 1 capsule (100 mg total) by mouth 3 (three) times daily.   loratadine (CLARITIN) 10 MG tablet Take 10 mg by mouth daily.   rizatriptan (MAXALT) 10 MG tablet Take 1 tablet (10 mg total) by mouth as needed for migraine. May repeat in 2 hours if needed   rosuvastatin (CRESTOR) 10 MG tablet TAKE 1 TABLET BY MOUTH  DAILY   benzonatate (TESSALON) 100 MG capsule Take 1 capsule (100 mg total) by mouth 3 (three) times daily as needed for cough.   Budeson-Glycopyrrol-Formoterol (BREZTRI AEROSPHERE) 160-9-4.8 MCG/ACT AERO Inhale 2 puffs into the lungs in the morning and at bedtime. (Patient not taking: Reported on 11/15/2022)   No facility-administered encounter medications on file as of 11/15/2022.     Review of Systems  Review of Systems  N/a Physical Exam  BP 118/76 (BP Location: Left Wrist, Cuff Size: Normal)   Pulse 73   Ht 5' (1.524 m)   Wt 280 lb (127 kg)   SpO2 96%   BMI 54.68 kg/m   Wt Readings from Last 5 Encounters:  11/15/22 280 lb (127 kg)  09/08/22 282 lb 12.8 oz (128.3 kg)  08/10/22 283 lb (128.4 kg)  06/04/22 278 lb 9.6 oz (126.4 kg)  03/08/22 282 lb 6.4 oz (128.1 kg)    BMI Readings from Last 5 Encounters:  11/15/22 54.68 kg/m  09/08/22 55.23 kg/m  08/10/22 55.27 kg/m  06/04/22 54.41 kg/m  03/08/22 55.15 kg/m     Physical Exam General: Sitting in chair, no acute distress Eyes: EOMI, no icterus Neck: Supple, no JVP Pulmonary: Clear, distant, normal work of breathing Cardiovascular regular rate and rhythm, no murmur MSK: No synovitis, joint effusion Neuro: Normal gait, no weakness Psych: Normal mood, full  affect   Assessment & Plan:   Dyspnea on exertion: Appears multifactorial.  Contribution of diastolic dysfunction, elevated left atrial pressure on recent TTE.  Some improvement with Lasix.  In addition, suspect obesity contributing.  Also high suspicion for deconditioning given gradually decreased activity over time.  Finally, worry about poorly controlled asthma.  Improved with maintenance ICS/LABA for asthma but still with relatively frequent albuterol use.  Altering therapy as below.  Asthma:  Intolerance to Advair.  Unclear what may have contributed this.  We will avoid fluticasone salmeterol containing products for now.  Symbicort high-dose 2 puffs twice a day started 06/2021 with improved symptoms.  Now with frequent albuterol use, more than ideal.  Escalate to [facility] 2 puffs twice daily.  Stop Symbicort once starts Breztri.    Return in about 6 months (around 05/16/2023).   Lanier Clam, MD 11/15/2022

## 2022-11-15 NOTE — Patient Instructions (Signed)
Nice to see you again  We will send in paperwork to start Breztri 2 puffs twice a day every day.  Rinse your mouth out with water after every use  Once you start Breztri, stop Symbicort.  In the meantime continue Symbicort as you are taking it.  Continue use albuterol as needed.  I would hope that once you start the Breo you find you need the albuterol much less frequently than now.  Return to clinic in 6 months or sooner if needed with Dr. Silas Flood

## 2022-11-26 ENCOUNTER — Ambulatory Visit (INDEPENDENT_AMBULATORY_CARE_PROVIDER_SITE_OTHER): Payer: Medicare Other | Admitting: Internal Medicine

## 2022-11-26 ENCOUNTER — Encounter: Payer: Self-pay | Admitting: Internal Medicine

## 2022-11-26 VITALS — BP 122/78 | HR 77 | Ht 60.0 in | Wt 280.4 lb

## 2022-11-26 DIAGNOSIS — E782 Mixed hyperlipidemia: Secondary | ICD-10-CM | POA: Diagnosis not present

## 2022-11-26 DIAGNOSIS — E1142 Type 2 diabetes mellitus with diabetic polyneuropathy: Secondary | ICD-10-CM | POA: Diagnosis not present

## 2022-11-26 LAB — POCT GLYCOSYLATED HEMOGLOBIN (HGB A1C): Hemoglobin A1C: 6.4 % — AB (ref 4.0–5.6)

## 2022-11-26 MED ORDER — GLIPIZIDE ER 2.5 MG PO TB24: 2.5000 mg | ORAL_TABLET | Freq: Every day | ORAL | 3 refills | Status: DC

## 2022-11-26 NOTE — Progress Notes (Signed)
Patient ID: Nicole Mccann, female   DOB: 1946/06/24, 77 y.o.   MRN: [id number]   HPI: Nicole Mccann is a 77 y.o.-year-old female, presenting for follow-up for DM2, dx in initially in 1965 with GDM, then again GDM in 1974, non-insulin-dependent, controlled, with long-term complications (PN).  Last visit 6 months ago. She is here with her daughter who offers part of the history especially related to her blood sugars, diet, medications.  Interim history: No blurry vision, nausea, chest pain. She has shortness of breath (asthma). She continues to not be very mobile at home.  She has leg swelling. She had COVID-19 in 09/2022 - on Paxlovid - rebound.  She has back pain >> on Lidocaine patches.  Reviewed HbA1c levels: Lab Results  Component Value Date   HGBA1C 6.9 (H) 09/08/2022   HGBA1C 6.7 (A) 06/04/2022   HGBA1C 6.7 (A) 01/28/2022   HGBA1C 6.8 (H) 09/09/2021   HGBA1C 6.5 11/26/2020   HGBA1C 6.8 (A) 09/05/2020   HGBA1C 6.6 (A) 05/02/2020   HGBA1C 6.3 (A) 01/09/2019   HGBA1C 5.8 (A) 09/05/2018   HGBA1C 6.3 (A) 04/18/2018   HGBA1C 6.4 12/12/2017   HGBA1C 6.2 02/09/2017   HGBA1C 6.1 01/21/2016   HGBA1C 6.0 (H) 07/08/2012   HGBA1C 6.1 (H) 07/07/2012   HGBA1C 5.8 02/13/2010   HGBA1C 6.0 06/22/2007   HGBA1C 6.0 03/20/2007   She was on: - Glipizide XL 2.5-5 mg (approximately 40 minutes) before breakfast  SGLT2 inh were very expensive.  We had to stop Metformin ER due to significant diarrhea.  We changed to: - Farxiga 10 mg before b'fast - from PAP - Glipizide ER 2.5 mg before a larger meal   Pt checks her sugars once a day: - am:   101-138 >> 133-167 >> 131-157 >> 132-154 >> 128-154, 171 - 2h after b'fast: n/c >> 186, 198 >> 120 >> 134 - before lunch: 130-150 >> 144-184 >> 120, 170  >> n/c - 2h after lunch: n/c >> 133 >> n/c >> 164, 185/225 >> n/c  - before dinner:  107, 140 >> n/c >> 129-132 >> 114-146 >> 124 - 2h after dinner: n/c >> 127-139 >> n/c >> 184, 184/191 >>  n/c - bedtime: n/c - nighttime: n/c Lowest sugar was 90 ...  >> 113 >> 114 >> 128; she has hypoglycemia awareness in the 70s. Highest: 198 >> 184 >> 170 >> 167 >> 225 >> 171.  Glucometer: One Touch Verio  Pt's meals are: - Breakfast: cereals, yoghurt (Activia), pastries, tea, honey >> f+ greek yoghurt + Kashi cereal - Lunch: salad, leftover sandwich, fruit - Dinner: meat, veggies, potato - Snacks: Sweets, sweet liquor Mel Almond) >> stopped due to headaches  No CKD, last BUN/creatinine:  Lab Results  Component Value Date   BUN 17 09/08/2022   BUN 14 03/08/2022   CREATININE 0.84 09/08/2022   CREATININE 0.94 03/08/2022   + HL; last set of lipids: Lab Results  Component Value Date   CHOL 182 09/08/2022   HDL 55.70 09/08/2022   LDLCALC 89 09/08/2022   LDLDIRECT 92.0 09/05/2020   TRIG 189.0 (H) 09/08/2022   CHOLHDL 3 09/08/2022  She refused statins in the past but we were able to start rosuvastatin 10 mg daily, which she now tolerates well.  In 08/2020  we added fish oil 1 capsule of 1000 mg a day  - Last eye exam was on 09/23/2022: no DR, + cataracts. She will need palpebral lifting surgery and cataract sx.  -  no numbness and tingling in her feet.  Last foot exam 01/28/2022. On Gabapentin.  She does have burning around her ankles and lower leg due to the leg swelling.  Pt has FH of DM in aunts.  She has OSA and is on a CPAP.  In summer 2019, she had hAs >> temporal artery Bx negative. She developed anaphylaxis to Vancomycin while in the hospital.  ROS: + see HPI  Past Medical History:  Diagnosis Date   Adjustment disorder with mixed anxiety and depressed mood 03/14/2015   Dr Madaline Guthrie actively treating [place]    ALLERGIC RHINITIS 04/04/2009   Qualifier: Diagnosis of  By: Alwyn Ren MD, Chrissie Noa     Allergy    Anxiety    Dr Madaline Guthrie   Asthma    ASTHMA 04/04/2009   Qualifier: Diagnosis of  By: Alwyn Ren MD, [name]   Status Asthmaticus 10/2004     Atypical seizure (HCC) 06/07/2012    BENIGN POSITIONAL VERTIGO 11/06/2009   Qualifier: Diagnosis of  By: Linford Arnold MD, Catherine     Depression    Dr Madaline Guthrie   Diabetes mellitus    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 08/17/13 seen in the emergency room; imaging negative. No labs. Referred to Dr. Pearletha Forge who recommended nonsteroidals and elevation. 10/18-11/3/14 in [place] for land and river cruise trip.  08/29/13 seen in ER in [place]; topical antibiotics & oral Ciprobay Rxed. 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 Xarelto 15 mg initiated 06/05/12 by Dr Jeraldine Loots , [facility] and increased to one pill twice a day. Despite this she had persistent thrombosis in the cephalic vein in the elbow and the upper forearm areas prompting change to Lovenox 110 mg e   Pulmonary embolism (HCC) 07/09/2012   Presumed based on intermediate VQ 07/07/2012 and high clinical probability.  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 12/17/12 Note: I recommended discussion with [name],Neurologist; & Dr Margaretmary Bayley, Diabetologist    Type 2 diabetes, controlled, with peripheral neuropathy (HCC) 04/04/2009   Qualifier: Diagnosis of  By: Alwyn Ren MD, Chrissie Noa   Dr Margaretmary Bayley, Endo Dr Emily Filbert , Ophth seen  anually     Variants of migraine, not elsewhere classified, without mention of intractable migraine without mention of status migrainosus 05/22/2013   Past Surgical History:  Procedure Laterality Date   APPENDECTOMY     ARTERY BIOPSY Left 04/14/2018   Procedure: BIOPSY TEMPORAL ARTERY;  Surgeon: Elam Dutch, MD;  Location: [facility] OR;  Service: Vascular;  Laterality: Left;   BREAST EXCISIONAL BIOPSY Right 1998   CHOLECYSTECTOMY     COLONOSCOPY  2006   [facility]   rotator cuff surgery     TONSILLECTOMY      Social History   Socioeconomic History   Marital status: Married    Spouse name: Elita Quick   Number of children: 2   Years of education: College   Highest education level: Not on file  Occupational History   Not on file  Tobacco Use   Smoking status: Never   Smokeless tobacco: Never  Substance and Sexual Activity   Alcohol use: Yes    Alcohol/week: 3.0 standard drinks of alcohol    Types: 3 Glasses of wine per week    Comment: 1/2 glass wine daily   Drug use: No   Sexual activity: Never  Other Topics Concern   Not on file  Social History Narrative   Patient is married Elita Quick) and lives at home with her husband.   Patient has two adult children.   Patient is retired.   Patient has a college education.   Patient is right-handed.   Patient drinks one cup of tea daily.   Social Determinants of Health   Financial Resource Strain: Low Risk  (06/09/2021)    Overall Financial Resource Strain (CARDIA)    Difficulty of Paying Living Expenses: Not hard at all  Food Insecurity: No Food Insecurity (06/09/2021)   Hunger Vital Sign    Worried About Running Out of Food in the Last Year: Never true    Ran Out of Food in the Last Year: Never true  Transportation Needs: No Transportation Needs (06/09/2021)   PRAPARE - Hydrologist (Medical): No    Lack of Transportation (Non-Medical): No  Physical Activity: Inactive (06/09/2021)   Exercise Vital Sign    Days of Exercise per Week: 0 days    Minutes of Exercise per Session: 0 min  Stress: No Stress Concern Present (06/09/2021)   [place]    Feeling of Stress : Not at all  Social Connections: Moderately Integrated (06/09/2021)   Social Connection and Isolation Panel [NHANES]    Frequency of Communication with Friends and Family: More than three times a week    Frequency of Social Gatherings with Friends and Family: More than three times a week    Attends Religious Services: Never    Marine scientist or Organizations: Yes    Attends Archivist Meetings: 1 to 4 times per year    Marital Status: Married  Human resources officer Violence: Not At Risk (06/09/2021)   Humiliation, Afraid, Rape, and Kick questionnaire    Fear of Current or Ex-Partner: No    Emotionally Abused: No    Physically Abused: No    Sexually Abused: No   Current Outpatient Medications on File Prior to Visit  Medication Sig Dispense Refill   albuterol (VENTOLIN HFA) 108 (90 Base) MCG/ACT inhaler Inhale 2 puffs into the lungs every 6 (six) hours as needed for wheezing or shortness of breath. 18 g 6  Bacillus Coagulans-Inulin (PROBIOTIC) 1-250 BILLION-MG CAPS Take 1 tablet by mouth daily at 6 (six) AM.     Budeson-Glycopyrrol-Formoterol (BREZTRI AEROSPHERE) 160-9-4.8 MCG/ACT AERO Inhale 2 puffs into the lungs in the morning and at bedtime.  (Patient not taking: Reported on 11/15/2022) 4.8 g 0   budesonide-formoterol (SYMBICORT) 160-4.5 MCG/ACT inhaler Inhale 2 puffs into the lungs 2 (two) times daily. 1 each 12   Cholecalciferol (VITAMIN D) 50 MCG (2000 UT) CAPS Take 1 capsule by mouth daily at 6 (six) AM.     dapagliflozin propanediol (FARXIGA) 10 MG TABS tablet Take 1 tablet (10 mg total) by mouth daily before breakfast. 90 tablet 3   FLUoxetine (PROZAC) 20 MG capsule TAKE 1 CAPSULE BY MOUTH  DAILY 90 capsule 3   gabapentin (NEURONTIN) 100 MG capsule Take 1 capsule (100 mg total) by mouth 3 (three) times daily. 270 capsule 1   loratadine (CLARITIN) 10 MG tablet Take 10 mg by mouth daily.     rizatriptan (MAXALT) 10 MG tablet Take 1 tablet (10 mg total) by mouth as needed for migraine. May repeat in 2 hours if needed 12 tablet 3   rosuvastatin (CRESTOR) 10 MG tablet TAKE 1 TABLET BY MOUTH  DAILY 90 tablet 3   No current facility-administered medications on file prior to visit.   Allergies  Allergen Reactions   Mushroom Ext Cmplx(Shiitake-Reishi-Mait) Anaphylaxis   Shellfish Allergy Anaphylaxis   Sulfonamide Derivatives Anaphylaxis   Vancomycin Anaphylaxis   Fluticasone-Salmeterol Other (See Comments)    Feels like something in the throat   Butalbital-Aspirin-Caffeine Other (See Comments)    UNSPECIFIED REACTION    Montelukast Sodium Other (See Comments)    Feels like she's running out of breath   Penicillins     UNSPECIFIED REACTION      Childhood allergy Has patient had a PCN reaction causing immediate rash, facial/tongue/throat swelling, SOB or lightheadedness with hypotension: Unknown Has patient had a PCN reaction causing severe rash involving mucus membranes or skin necrosis: Unknown Has patient had a PCN reaction that required hospitalization: No Has patient had a PCN reaction occurring within the last 10 years: No If all of the above answers are "NO", then may proceed with Cephalosporin use.     Acetaminophen  Other (See Comments)    Stomach discomfort   Clarithromycin Nausea And Vomiting   Iodinated Contrast Media Other (See Comments)    Bad headache; no prep needed   Levofloxacin Diarrhea and Nausea And Vomiting    Dizziness Believes it was due to a high dosage of this medication   Orphenadrine Nausea And Vomiting   Family History  Problem Relation Age of Onset   Stomach cancer Father    Coronary artery disease Father    Breast cancer Sister 103       breast cancer   Diabetes Maternal Aunt    Diabetes Paternal Aunt    Prostate cancer Brother    Cancer Daughter    Stroke Neg Hx    PE: BP 122/78 (BP Location: Right Arm, Patient Position: Sitting, Cuff Size: Normal)   Pulse 77   Ht 5' (1.524 m)   Wt 280 lb 6.4 oz (127.2 kg)   SpO2 97%   BMI 54.76 kg/m  Wt Readings from Last 3 Encounters:  11/26/22 280 lb 6.4 oz (127.2 kg)  11/15/22 280 lb (127 kg)  09/08/22 282 lb 12.8 oz (128.3 kg)   Constitutional: overweight, in NAD, + using a walker Eyes: EOMI, no exophthalmos ENT:  no  thyromegaly, no cervical lymphadenopathy Cardiovascular: RRR, No MRG,+ LE edema B Respiratory: CTA B Musculoskeletal: no deformities Skin: + stasis dermatitis bilateral legs -wears compression hoses  Neurological: no tremor with outstretched hands Diabetic Foot Exam - Simple   Simple Foot Form Diabetic Foot exam was performed with the following findings: Yes 11/26/2022  4:32 PM  Visual Inspection No deformities, no ulcerations, no other skin breakdown bilaterally: Yes Sensation Testing Intact to touch and monofilament testing bilaterally: Yes Pulse Check Posterior Tibialis and Dorsalis pulse intact bilaterally: Yes Comments Elongated, thick nails    ASSESSMENT: 1. DM2, non-insulin-dependent, now more controlled, with complications - PN  2. HL  3.  Obesity class III  PLAN:  1. Patient with fairly well-controlled type 2 diabetes, with multiple medication intolerances, but overall reasonable  diabetes control. -Diabetic regimen is limited by previous side effects and preferences: Metformin IR, and ER - she was taking these inconsistently due to diarrhea and fecal incontinence low-dose glipizide XL - she felt that this was also causing her GI symptoms and stopped it.  SGLT2 inhibitors were too expensive in the past.  I sent a new prescription for these to her pharmacy in 08/2020, but she was not able to start them due to price. Daughter called the insurance and SGLT2 inhibitors were not covered. She refused injectables including insulin and GLP-1 receptor agonist. Actos is not ideal for her since this could cause more swelling. -We were able to start Farxiga from the patient assistance program.  She tolerates it well. -At last visit, sugars were slightly higher in the previous 2 weeks, but overall still acceptable.  HbA1c was 6.7%.  However, since then, she had another HbA1c obtained 2.5 months ago and this was higher, at 6.9%. -At today's visit, reviewing her blood sugars at home, they are at or slightly above goal.  She checks at different times of the day, but mentions that all of the checks are done before eating breakfast.  For now, especially in the light of the improved HbA1c, I did not suggest any change in regimen.  She has been using glipizide 2.5 mg before a larger meal since last visit (uses this seldom) with good results.  Will continue this. - I suggested to:  Patient Instructions  Please continue: - Farxiga 10 mg before b'fast - Glipizide ER 2.5 mg before a larger meal  Please return in 6 months with your sugar log.   - we checked her HbA1c: 6.4% (lower) - advised to check sugars at different times of the day - 1x a day, rotating check times - advised for yearly eye exams >> she is UTD - return to clinic in 6  months  2.  HL -Reviewed latest lipid panel from 09/2022: LDL above our target of less than 70, triglycerides high: Lab Results  Component Value Date   CHOL  182 09/08/2022   HDL 55.70 09/08/2022   LDLCALC 89 09/08/2022   LDLDIRECT 92.0 09/05/2020   TRIG 189.0 (H) 09/08/2022   CHOLHDL 3 09/08/2022  -On Crestor 10 mg daily, tolerated well, also fish oil 1000 mg daily  3.  Obesity class III -She continues on Farxiga, which should also help with wt loss -She lost 5 pounds before last visit -She gained 2 pounds since last visit  Carlus Pavlov, MD PhD [facility] Endocrinology

## 2022-11-26 NOTE — Patient Instructions (Addendum)
Please continue: - Farxiga 10 mg before b'fast - Glipizide ER 2.5 mg before a larger meal  Please return in 6 months with your sugar log.

## 2022-12-16 NOTE — Progress Notes (Signed)
[facility] [address] [place]. [place] [address] [phone]       OFFICE FOLLOW UP NOTE  [name] Date of Birth:  12-20-1945 Medical Record Number:  [id number]   Reason for visit: Initial CPAP follow-up  Virtual Visit via Video Note  I connected with Nicole Mccann on 12/20/22 at  3:00 PM EST by a video enabled telemedicine application and verified that I am speaking with the correct person using two identifiers.  Location: Patient: at home Provider: in office   I discussed the limitations of evaluation and management by telemedicine and the availability of in person appointments. The patient expressed understanding and agreed to proceed.    SUBJECTIVE:    HPI:   Nicole Mccann is a 77 y.o. female who is being followed in this office for OSA on CPAP.  Initially seen by Dr. Brett Fairy on 08/10/2022 with prior history of sleep apnea on CPAP machine no longer working appropriately.  Completed HST 11/7 which showed severe OSA with total AHI of 37.7/h.  Received new CPAP on 11/15.  Interval history: Patient being seen today for via MyChart VV for initial CPAP compliance visit.  Reports tolerating CPAP well. Will have occasional leaks but will resolve after adjusting mask. No questions or concerns today.             ROS:   14 system review of systems performed and negative with exception of those listed in HPI  PMH:  Past Medical History:  Diagnosis Date   Adjustment disorder with mixed anxiety and depressed mood 03/14/2015   Dr Albertine Patricia actively treating [place] 04/04/2009   Qualifier: Diagnosis of  By: [name] MD, [name]     Allergy    Anxiety    Dr Albertine Patricia   Asthma    ASTHMA 04/04/2009   Qualifier: Diagnosis of  By: [name] MD, [place]   Status Asthmaticus 10/2004     Atypical seizure ([place]) 06/07/2012   BENIGN POSITIONAL VERTIGO 11/06/2009   Qualifier: Diagnosis of  By: [name] MD, [place]      Depression    Dr Albertine Patricia   Diabetes mellitus    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 08/17/13 seen in the emergency room; imaging negative. No labs. Referred to Dr. [name] who recommended nonsteroidals and elevation. 10/18-11/3/14 in [place] for land and river cruise trip.  08/29/13 seen in ER in [place]; topical antibiotics & oral Ciprobay Rxed. 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 Xarelto 15 mg initiated 06/05/12 by Dr [name] , [facility] [place] [facility] and increased to one pill twice a day. Despite this  she had persistent thrombosis in the cephalic vein in the elbow and the upper forearm areas prompting change to Lovenox 110 mg e   Pulmonary embolism ([place]) 07/09/2012   Presumed based on intermediate VQ 07/07/2012 and high clinical probability.  Definitive CT angiogram was not possible due to contrast  intolerance/allergy    Right shoulder pain 04/04/2015   Rotator cuff (capsule) sprain 03/13/2015   Seizure disorder, temporal lobe, intractable ([place]) 10/17/2014   Severe obesity (BMI >= 40) ([place]) 10/17/2014   Shingles 07/2013   Spinal stenosis of lumbar region 12/17/2012   S/P suboptimal response to Chiropractry ;Dr  Hurley Cisco referred her  to [name]  [facility] [phone]; FAX [phone] MRI 12/15/12 : stenosis @ L3-4; L 4-5  in context of DDD ESI recommended . 12/17/12 Note: I recommended discussion with [name],Neurologist; & [name], Diabetologist    Type 2 diabetes, controlled, with peripheral neuropathy ([place]) 04/04/2009   Qualifier: Diagnosis of  By: [name] MD, [name]   [name], Endo [name] , Ophth seen anually     Variants of migraine, not elsewhere classified, without mention of intractable migraine without mention of status migrainosus 05/22/2013    PSH:  Past Surgical History:  Procedure Laterality Date   APPENDECTOMY     ARTERY BIOPSY Left 04/14/2018   Procedure: BIOPSY TEMPORAL ARTERY;  Surgeon: Elam Dutch, MD;  Location: [facility] OR;  Service: Vascular;  Laterality: Left;   BREAST EXCISIONAL BIOPSY Right 1998   CHOLECYSTECTOMY     COLONOSCOPY  2006   [facility]   rotator cuff surgery     TONSILLECTOMY       Social History:  Social History   Socioeconomic History   Marital status: Married    Spouse name: Elita Quick   Number of children: 2   Years of education: College   Highest education level: Not on file  Occupational History   Not on file  Tobacco Use   Smoking status: Never   Smokeless tobacco: Never  Substance and Sexual Activity   Alcohol use: Yes    Alcohol/week: 3.0 standard drinks of alcohol    Types: 3 Glasses of wine per week    Comment: 1/2 glass wine daily   Drug use: No   Sexual activity: Never  Other Topics Concern   Not on file  Social History Narrative   Patient is married Elita Quick) and lives at home with her husband.   Patient has two adult children.   Patient is retired.   Patient has a college education.   Patient is right-handed.   Patient drinks one cup of tea daily.   Social Determinants of Health   Financial Resource Strain: Low Risk  (06/09/2021)   Overall Financial Resource Strain (CARDIA)    Difficulty of Paying  Living Expenses: Not hard at all  Food Insecurity: No Food Insecurity (06/09/2021)   Hunger Vital Sign    Worried About Running Out of Food in the Last Year: Never true    Ran Out of Food in the Last Year: Never true  Transportation Needs: No Transportation Needs (06/09/2021)   PRAPARE - Hydrologist (Medical): No    Lack of Transportation (Non-Medical): No  Physical Activity: Inactive (06/09/2021)   Exercise Vital Sign    Days of Exercise per Week: 0 days    Minutes of Exercise per Session: 0 min  Stress: No Stress Concern Present (06/09/2021)   [employer] -  Occupational Stress Questionnaire    Feeling of Stress : Not at all  Social Connections: Moderately Integrated (06/09/2021)   Social Connection and Isolation Panel [NHANES]    Frequency of Communication with Friends and Family: More than three times a week    Frequency of Social Gatherings with Friends and Family: More than three times a week    Attends Religious Services: Never    Marine scientist or Organizations: Yes    Attends Archivist Meetings: 1 to 4 times per year    Marital Status: Married  Human resources officer Violence: Not At Risk (06/09/2021)   Humiliation, Afraid, Rape, and Kick questionnaire    Fear of Current or Ex-Partner: No    Emotionally Abused: No    Physically Abused: No    Sexually Abused: No    Family History:  Family History  Problem Relation Age of Onset   Stomach cancer Father    Coronary artery disease Father    Breast cancer Sister 64       breast cancer   Diabetes Maternal Aunt    Diabetes Paternal Aunt    Prostate cancer Brother    Cancer Daughter    Stroke Neg Hx     Medications:   Current Outpatient Medications on File Prior to Visit  Medication Sig Dispense Refill   albuterol (VENTOLIN HFA) 108 (90 Base) MCG/ACT inhaler Inhale 2 puffs into the lungs every 6 (six) hours as needed for wheezing or shortness of breath. 18 g 6    Bacillus Coagulans-Inulin (PROBIOTIC) 1-250 BILLION-MG CAPS Take 1 tablet by mouth daily at 6 (six) AM.     Budeson-Glycopyrrol-Formoterol (BREZTRI AEROSPHERE) 160-9-4.8 MCG/ACT AERO Inhale 2 puffs into the lungs in the morning and at bedtime. (Patient not taking: Reported on 11/15/2022) 4.8 g 0   budesonide-formoterol (SYMBICORT) 160-4.5 MCG/ACT inhaler Inhale 2 puffs into the lungs 2 (two) times daily. 1 each 12   Cholecalciferol (VITAMIN D) 50 MCG (2000 UT) CAPS Take 1 capsule by mouth daily at 6 (six) AM.     dapagliflozin propanediol (FARXIGA) 10 MG TABS tablet Take 1 tablet (10 mg total) by mouth daily before breakfast. 90 tablet 3   FLUoxetine (PROZAC) 20 MG capsule TAKE 1 CAPSULE BY MOUTH  DAILY 90 capsule 3   gabapentin (NEURONTIN) 100 MG capsule Take 1 capsule (100 mg total) by mouth 3 (three) times daily. 270 capsule 1   glipiZIDE (GLUCOTROL XL) 2.5 MG 24 hr tablet Take 1 tablet (2.5 mg total) by mouth daily with supper. 30 tablet 3   loratadine (CLARITIN) 10 MG tablet Take 10 mg by mouth daily.     rizatriptan (MAXALT) 10 MG tablet Take 1 tablet (10 mg total) by mouth as needed for migraine. May repeat in 2 hours if needed 12 tablet 3   rosuvastatin (CRESTOR) 10 MG tablet TAKE 1 TABLET BY MOUTH  DAILY 90 tablet 3   No current facility-administered medications on file prior to visit.    Allergies:   Allergies  Allergen Reactions   Mushroom Ext Cmplx(Shiitake-Reishi-Mait) Anaphylaxis   Shellfish Allergy Anaphylaxis   Sulfonamide Derivatives Anaphylaxis   Vancomycin Anaphylaxis   Fluticasone-Salmeterol Other (See Comments)    Feels like something in the throat   Butalbital-Aspirin-Caffeine Other (See Comments)    UNSPECIFIED REACTION    Montelukast Sodium Other (See Comments)    Feels like she's running out of breath   Penicillins     UNSPECIFIED REACTION      Childhood allergy  Has patient had a PCN reaction causing immediate rash, facial/tongue/throat swelling, SOB or  lightheadedness with hypotension: Unknown Has patient had a PCN reaction causing severe rash involving mucus membranes or skin necrosis: Unknown Has patient had a PCN reaction that required hospitalization: No Has patient had a PCN reaction occurring within the last 10 years: No If all of the above answers are "NO", then may proceed with Cephalosporin use.     Acetaminophen Other (See Comments)    Stomach discomfort   Clarithromycin Nausea And Vomiting   Iodinated Contrast Media Other (See Comments)    Bad headache; no prep needed   Levofloxacin Diarrhea and Nausea And Vomiting    Dizziness Believes it was due to a high dosage of this medication   Orphenadrine Nausea And Vomiting      OBJECTIVE:  Physical Exam N/A d/t visit type        ASSESSMENT/PLAN: Nicole Mccann is a 77 y.o. year old female    OSA on CPAP : Compliance report shows satisfactory usage with optimal residual AHI.  Continue current pressure settings.  Discussed continued nightly usage with ensuring greater than 4 hours nightly for optimal benefit and per insurance purposes.  Continue to follow with DME company for any needed supplies or CPAP related concerns     Follow up in 6 months or call earlier if needed   CC:  PCP: Copland, Gay Filler, MD    I spent 16 minutes of face-to-face and non-face-to-face time with patient.  This included previsit chart review, lab review, study review, order entry, electronic health record documentation, patient education regarding diagnosis of sleep apnea with review and discussion of compliance report and answered all other questions to patient's satisfaction  Frann Rider, [facility]  [facility] [address] [place], [place] [address]  Phone [phone] Fax [phone] Note: This document was prepared with digital dictation and possible smart phrase technology. Any transcriptional errors that result from this process are  unintentional.

## 2022-12-18 ENCOUNTER — Encounter (HOSPITAL_BASED_OUTPATIENT_CLINIC_OR_DEPARTMENT_OTHER): Payer: Self-pay | Admitting: Emergency Medicine

## 2022-12-18 ENCOUNTER — Other Ambulatory Visit: Payer: Self-pay

## 2022-12-18 ENCOUNTER — Emergency Department (HOSPITAL_BASED_OUTPATIENT_CLINIC_OR_DEPARTMENT_OTHER): Payer: Medicare Other

## 2022-12-18 ENCOUNTER — Emergency Department (HOSPITAL_BASED_OUTPATIENT_CLINIC_OR_DEPARTMENT_OTHER)
Admission: EM | Admit: 2022-12-18 | Discharge: 2022-12-18 | Disposition: A | Discharge status: Home / Self Care | Payer: Medicare Other | Attending: Emergency Medicine | Admitting: Emergency Medicine

## 2022-12-18 DIAGNOSIS — W182XXA Fall in (into) shower or empty bathtub, initial encounter: Secondary | ICD-10-CM | POA: Diagnosis not present

## 2022-12-18 DIAGNOSIS — Z7984 Long term (current) use of oral hypoglycemic drugs: Secondary | ICD-10-CM | POA: Diagnosis not present

## 2022-12-18 DIAGNOSIS — S2002XA Contusion of left breast, initial encounter: Secondary | ICD-10-CM | POA: Diagnosis not present

## 2022-12-18 DIAGNOSIS — Y93E5 Activity, floor mopping and cleaning: Secondary | ICD-10-CM | POA: Insufficient documentation

## 2022-12-18 DIAGNOSIS — W19XXXA Unspecified fall, initial encounter: Secondary | ICD-10-CM

## 2022-12-18 DIAGNOSIS — S40012A Contusion of left shoulder, initial encounter: Secondary | ICD-10-CM | POA: Diagnosis not present

## 2022-12-18 DIAGNOSIS — S8001XA Contusion of right knee, initial encounter: Secondary | ICD-10-CM | POA: Insufficient documentation

## 2022-12-18 DIAGNOSIS — Z23 Encounter for immunization: Secondary | ICD-10-CM | POA: Diagnosis not present

## 2022-12-18 DIAGNOSIS — Y92002 Bathroom of unspecified non-institutional (private) residence single-family (private) house as the place of occurrence of the external cause: Secondary | ICD-10-CM | POA: Insufficient documentation

## 2022-12-18 DIAGNOSIS — S81812A Laceration without foreign body, left lower leg, initial encounter: Secondary | ICD-10-CM | POA: Diagnosis not present

## 2022-12-18 DIAGNOSIS — S8992XA Unspecified injury of left lower leg, initial encounter: Secondary | ICD-10-CM | POA: Diagnosis present

## 2022-12-18 DIAGNOSIS — L089 Local infection of the skin and subcutaneous tissue, unspecified: Secondary | ICD-10-CM

## 2022-12-18 DIAGNOSIS — E119 Type 2 diabetes mellitus without complications: Secondary | ICD-10-CM | POA: Insufficient documentation

## 2022-12-18 LAB — CBC WITH DIFFERENTIAL/PLATELET
Abs Immature Granulocytes: 0.04 10*3/uL (ref 0.00–0.07)
Basophils Absolute: 0 10*3/uL (ref 0.0–0.1)
Basophils Relative: 0 %
Eosinophils Absolute: 0.2 10*3/uL (ref 0.0–0.5)
Eosinophils Relative: 2 %
HCT: 45.6 % (ref 36.0–46.0)
Hemoglobin: 15 g/dL (ref 12.0–15.0)
Immature Granulocytes: 1 %
Lymphocytes Relative: 25 %
Lymphs Abs: 2.1 10*3/uL (ref 0.7–4.0)
MCH: 30.8 pg (ref 26.0–34.0)
MCHC: 32.9 g/dL (ref 30.0–36.0)
MCV: 93.6 fL (ref 80.0–100.0)
Monocytes Absolute: 0.7 10*3/uL (ref 0.1–1.0)
Monocytes Relative: 9 %
Neutro Abs: 5.3 10*3/uL (ref 1.7–7.7)
Neutrophils Relative %: 63 %
Platelets: 190 10*3/uL (ref 150–400)
RBC: 4.87 MIL/uL (ref 3.87–5.11)
RDW: 13.1 % (ref 11.5–15.5)
WBC: 8.4 10*3/uL (ref 4.0–10.5)
nRBC: 0 % (ref 0.0–0.2)

## 2022-12-18 LAB — BASIC METABOLIC PANEL
Anion gap: 8 (ref 5–15)
BUN: 17 mg/dL (ref 8–23)
CO2: 25 mmol/L (ref 22–32)
Calcium: 8.8 mg/dL — ABNORMAL LOW (ref 8.9–10.3)
Chloride: 102 mmol/L (ref 98–111)
Creatinine, Ser: 0.92 mg/dL (ref 0.44–1.00)
GFR, Estimated: >60 mL/min (ref >60)
Glucose, Bld: 167 mg/dL — ABNORMAL HIGH (ref 70–99)
Potassium: 4.2 mmol/L (ref 3.5–5.1)
Sodium: 135 mmol/L (ref 135–145)

## 2022-12-18 LAB — CBG MONITORING, ED: Glucose-Capillary: 157 mg/dL — ABNORMAL HIGH (ref 70–99)

## 2022-12-18 MED ORDER — CEPHALEXIN 500 MG PO CAPS: 500.0000 mg | ORAL_CAPSULE | Freq: Three times a day (TID) | ORAL | 0 refills | Status: DC

## 2022-12-18 MED ORDER — BACITRACIN ZINC 500 UNIT/GM EX OINT: 1.0000 | TOPICAL_OINTMENT | Freq: Two times a day (BID) | CUTANEOUS | 0 refills | Status: DC

## 2022-12-18 MED ORDER — MORPHINE SULFATE (PF) 4 MG/ML IV SOLN
4.0000 mg | Freq: Once | INTRAVENOUS | Status: AC
  Administered 2022-12-18: 4 mg via INTRAVENOUS
  Filled 2022-12-18: qty 1

## 2022-12-18 MED ORDER — BACITRACIN ZINC 500 UNIT/GM EX OINT
TOPICAL_OINTMENT | Freq: Once | CUTANEOUS | Status: AC
  Filled 2022-12-18: qty 28.35

## 2022-12-18 MED ORDER — TETANUS-DIPHTH-ACELL PERTUSSIS 5-2.5-18.5 LF-MCG/0.5 IM SUSY
0.5000 mL | PREFILLED_SYRINGE | Freq: Once | INTRAMUSCULAR | Status: AC
  Administered 2022-12-18: 0.5 mL via INTRAMUSCULAR
  Filled 2022-12-18: qty 0.5

## 2022-12-18 MED ORDER — TRAMADOL HCL 50 MG PO TABS: 50.0000 mg | ORAL_TABLET | Freq: Four times a day (QID) | ORAL | 0 refills | Status: DC | PRN

## 2022-12-18 MED ORDER — CEPHALEXIN 250 MG PO CAPS
500.0000 mg | ORAL_CAPSULE | Freq: Once | ORAL | Status: AC
  Administered 2022-12-18: 500 mg via ORAL
  Filled 2022-12-18: qty 2

## 2022-12-18 NOTE — ED Notes (Signed)
Pt in restroom at this time.

## 2022-12-18 NOTE — ED Notes (Signed)
Patient transported to X-ray 

## 2022-12-18 NOTE — ED Notes (Signed)
ED Provider at bedside. 

## 2022-12-18 NOTE — Discharge Instructions (Signed)
You have an infected skin tear.  I recommend you apply bacitracin ointment twice daily.  You should also take Keflex 500 mg 3 times a day for 5 days  Take Tylenol or Motrin for pain and Ultram for severe pain  See your doctor for follow-up  Return to ER if you have worse leg swelling or redness or pain or purulent discharge or fever

## 2022-12-18 NOTE — ED Triage Notes (Addendum)
Pt fell in bathroom yesterday; c/o lac to LLE from cabinet door; no thinners; took Advil around 1200 noon

## 2022-12-18 NOTE — ED Provider Notes (Signed)
[facility] Provider Note   CSN: [id number] Arrival date & time: 12/18/22  1613     History  Chief Complaint  Patient presents with   Nicole Mccann is a 77 y.o. female history of diabetes, here presenting with fall.  Patient states that she was cleaning the bathroom yesterday and the towel rack fell and she hit her right knee on the cabinet door and the towel rack cut her left shin area.  She also fell on her chest as well.  She also complains of left shoulder pain.  Denies any head injury or loss of consciousness.  Patient is not on blood thinners.  The history is provided by the patient.       Home Medications Prior to Admission medications   Medication Sig Start Date End Date Taking? Authorizing Provider  albuterol (VENTOLIN HFA) 108 (90 Base) MCG/ACT inhaler Inhale 2 puffs into the lungs every 6 (six) hours as needed for wheezing or shortness of breath. 04/27/21   Copland, Gay Filler, MD  Bacillus Coagulans-Inulin (PROBIOTIC) 1-250 BILLION-MG CAPS Take 1 tablet by mouth daily at 6 (six) AM.    [provider]  Budeson-Glycopyrrol-Formoterol (BREZTRI AEROSPHERE) 160-9-4.8 MCG/ACT AERO Inhale 2 puffs into the lungs in the morning and at bedtime. Patient not taking: Reported on 11/15/2022 06/16/21   Hunsucker, Bonna Gains, MD  budesonide-formoterol [facility]) 160-4.5 MCG/ACT inhaler Inhale 2 puffs into the lungs 2 (two) times daily. 06/16/21   Hunsucker, Bonna Gains, MD  Cholecalciferol (VITAMIN D) 50 MCG (2000 UT) CAPS Take 1 capsule by mouth daily at 6 (six) AM.    [provider]  dapagliflozin propanediol (FARXIGA) 10 MG TABS tablet Take 1 tablet (10 mg total) by mouth daily before breakfast. 06/04/22   Philemon Kingdom, MD  FLUoxetine (PROZAC) 20 MG capsule TAKE 1 CAPSULE BY MOUTH  DAILY 12/24/21   Copland, Gay Filler, MD  gabapentin (NEURONTIN) 100 MG capsule Take 1 capsule (100 mg total) by mouth 3 (three) times  daily. 10/04/22   Copland, Gay Filler, MD  glipiZIDE (GLUCOTROL XL) 2.5 MG 24 hr tablet Take 1 tablet (2.5 mg total) by mouth daily with supper. 11/26/22   Philemon Kingdom, MD  loratadine (CLARITIN) 10 MG tablet Take 10 mg by mouth daily.    [provider]  rizatriptan (MAXALT) 10 MG tablet Take 1 tablet (10 mg total) by mouth as needed for migraine. May repeat in 2 hours if needed 04/27/21   Copland, Gay Filler, MD  rosuvastatin (CRESTOR) 10 MG tablet TAKE 1 TABLET BY MOUTH  DAILY 12/24/21   Philemon Kingdom, MD      Allergies    Mushroom ext cmplx(shiitake-reishi-mait), Shellfish allergy, Sulfonamide derivatives, Vancomycin, Fluticasone-salmeterol, Butalbital-aspirin-caffeine, Montelukast sodium, Penicillins, Acetaminophen, Clarithromycin, Iodinated contrast media, Levofloxacin, and Orphenadrine    Review of Systems   Review of Systems  Musculoskeletal:        Left shoulder and left leg and right knee pain  All other systems reviewed and are negative.   Physical Exam Updated Vital Signs BP (!) 140/69 (BP Location: Left Arm)   Pulse 67   Temp 98.2 F (36.8 C) (Oral)   Resp 20   Ht 5' (1.524 m)   Wt 127 kg   SpO2 94%   BMI 54.68 kg/m  Physical Exam Vitals and nursing note reviewed.  Constitutional:      Appearance: Normal appearance.     Comments: Uncomfortable  HENT:  Head: Normocephalic and atraumatic.     Nose: Nose normal.     Mouth/Throat:     Mouth: Mucous membranes are moist.  Eyes:     Extraocular Movements: Extraocular movements intact.     Pupils: Pupils are equal, round, and reactive to light.  Cardiovascular:     Rate and Rhythm: Normal rate and regular rhythm.  Pulmonary:     Effort: Pulmonary effort is normal.     Breath sounds: Normal breath sounds.     Comments: Bruising of the left breast area. Abdominal:     General: Abdomen is flat.     Palpations: Abdomen is soft.  Musculoskeletal:     Cervical back: Normal range of motion and neck  supple.     Comments: Skin tear left lower leg with surrounding redness.  Patient has bruising on the right knee.  Patient also has bruising of the left shoulder area.  Patient is able to ambulate by herself.  Neurovascularly intact in all extremities  Skin:    General: Skin is warm.     Capillary Refill: Capillary refill takes less than 2 seconds.  Neurological:     General: No focal deficit present.     Mental Status: She is alert and oriented to person, place, and time.  Psychiatric:        Mood and Affect: Mood normal.        Behavior: Behavior normal.     ED Results / Procedures / Treatments   Labs (all labs ordered are listed, but only abnormal results are displayed) Labs Reviewed  CBC WITH DIFFERENTIAL/PLATELET  BASIC METABOLIC PANEL    EKG None  Radiology No results found.  Procedures Procedures    Medications Ordered in ED Medications  morphine (PF) 4 MG/ML injection 4 mg (has no administration in time range)  Tdap (BOOSTRIX) injection 0.5 mL (has no administration in time range)  cephALEXin (KEFLEX) capsule 500 mg (has no administration in time range)    ED Course/ Medical Decision Making/ A&P                             Medical Decision Making Nicole Mccann is a 77 y.o. female here presenting with fall with skin tear in the left leg area and also right knee pain and left shoulder pain.  Patient's tetanus is not up-to-date.  There is some surrounding redness to the skin tear in the left leg area so we will give antibiotics.  Will also check a CBC.  Will get extremity x-rays and give pain medicine.  5:23 PM Labs unremarkable and x-rays did not show any fracture.  Wound was dressed by the nurse.  I told patient that she can apply bacitracin to the wound and also take Keflex to prevent infection.  Will prescribe tramadol for pain.  Problems Addressed: Fall, initial encounter: acute illness or injury Infected skin tear: acute illness or injury  Amount  and/or Complexity of Data Reviewed Labs: ordered. Decision-making details documented in ED Course. Radiology: ordered and independent interpretation performed. Decision-making details documented in ED Course.  Risk OTC drugs. Prescription drug management.    Final Clinical Impression(s) / ED Diagnoses Final diagnoses:  None    Rx / DC Orders ED Discharge Orders     None         Drenda Freeze, MD 12/18/22 1725

## 2022-12-20 ENCOUNTER — Telehealth: Payer: Self-pay

## 2022-12-20 ENCOUNTER — Encounter: Payer: Self-pay | Admitting: Adult Health

## 2022-12-20 ENCOUNTER — Telehealth (INDEPENDENT_AMBULATORY_CARE_PROVIDER_SITE_OTHER): Payer: Medicare Other | Admitting: Adult Health

## 2022-12-20 DIAGNOSIS — G4733 Obstructive sleep apnea (adult) (pediatric): Secondary | ICD-10-CM | POA: Diagnosis not present

## 2022-12-20 NOTE — Transitions of Care (Post Inpatient/ED Visit) (Signed)
   12/20/2022  Name: Nicole Mccann MRN: [id number] DOB: 03-Oct-1946  Today's TOC FU Call Status: Today's TOC FU Call Status:: Successful TOC FU Call Competed TOC FU Call Complete Date: 12/20/22  Transition Care Management Follow-up Telephone Call Date of Discharge: 12/18/22 Discharge Facility[facility] Type of Discharge: Emergency Department Reason for ED Visit: Other: (Fall) How have you been since you were released from the hospital?: Same Any questions or concerns?: No  Items Reviewed: Did you receive and understand the discharge instructions provided?: Yes Medications obtained and verified?: No Medications Not Reviewed Reasons:: Other: (Unavailable at time of call. States no changes to routine meds) Any new allergies since your discharge?: No Dietary orders reviewed?: No Do you have support at home?: Yes People in Home: child(ren), adult  Home Care and Equipment/Supplies: [place] Ordered?: No Any new equipment or medical supplies ordered?: No  Functional Questionnaire: Do you need assistance with bathing/showering or dressing?: No Do you need assistance with meal preparation?: No Do you need assistance with eating?: No Do you have difficulty maintaining continence: No Do you need assistance with getting out of bed/getting out of a chair/moving?: No Do you have difficulty managing or taking your medications?: No  Folllow up appointments reviewed: PCP Follow-up appointment confirmed?: Yes Date of PCP follow-up appointment?: 12/21/22 Follow-up Provider: [facility] Follow-up appointment confirmed?: NA Do you need transportation to your follow-up appointment?: No Do you understand care options if your condition(s) worsen?: Yes-patient verbalized understanding    SIGNATURE:  Gerilyn Nestle

## 2022-12-21 ENCOUNTER — Ambulatory Visit: Payer: Medicare Other | Admitting: Family

## 2022-12-21 VITALS — BP 140/62 | HR 68 | Temp 98.4°F | Resp 18 | Wt 274.0 lb

## 2022-12-21 DIAGNOSIS — T148XXA Other injury of unspecified body region, initial encounter: Secondary | ICD-10-CM

## 2022-12-21 MED ORDER — DOXYCYCLINE HYCLATE 100 MG PO TABS: 100.0000 mg | ORAL_TABLET | Freq: Two times a day (BID) | ORAL | 0 refills | Status: DC

## 2022-12-21 NOTE — Progress Notes (Signed)
Subjective:   By signing my name below, I, Nicole Mccann, attest that this documentation has been prepared under the direction and in the presence of Nicole Alar, NP. 12/21/2022   Patient ID: Nicole Mccann, female    DOB: 1946-05-22, 77 y.o.   MRN: [id number]  Chief Complaint  Patient presents with   Follow-up    Here for hospital follow up   Fall    Patient reports falling in her bathroom Friday, was seen at er Saturday    Fall   Patient is in today for a ER follow up.   She fell on Friday and suffered a skin tear on her left shin after falling. She went to the ED the next day. She was given morphine, Keflex, and a TDAP vaccine. She started vomiting 4 hours have receiving her morphine and keflex. She taken keflex yesterday at 3:30 pm and started experiencing nausea again.     Past Medical History:  Diagnosis Date   Adjustment disorder with mixed anxiety and depressed mood 03/14/2015   Dr Albertine Patricia actively treating [place] 04/04/2009   Qualifier: Diagnosis of  By: [name] MD, [name]     Allergy    Anxiety    Dr Albertine Patricia   Asthma    ASTHMA 04/04/2009   Qualifier: Diagnosis of  By: [name] MD, [place]   Status Asthmaticus 10/2004     Atypical seizure ([place]) 06/07/2012   BENIGN POSITIONAL VERTIGO 11/06/2009   Qualifier: Diagnosis of  By: [name] MD, [place]     Depression    Dr Albertine Patricia   Diabetes mellitus    Disturbances of sensation of smell and taste 05/22/2013   Diverticulosis of colon 04/04/2009   Qualifier: Diagnosis of  By: [name] MD, [name]     Dizziness and giddiness 12/22/2016   DVT (deep venous thrombosis) (HCC)    Headache(784.0)    Hyperlipidemia    Hypersomnia with sleep apnea, unspecified 05/22/2014   Injury of left knee 08/22/2013   [place]15[place]13 [place] contusion and abrasion of left knee. 08/17/13 seen in the emergency room; imaging negative. No labs. Referred to Dr. [name] who recommended  nonsteroidals and elevation. 10/18-11/3/14 in [place] for land and river cruise trip.  08/29/13 seen in ER in [place]; topical antibiotics & oral Ciprobay Rxed. 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 Xarelto 15 mg initiated 06/05/12 by Dr [name] , [name] and increased to one pill twice a day. Despite this she had persistent thrombosis in the cephalic vein in the elbow and the upper forearm areas prompting change to Lovenox 110 mg e   Pulmonary embolism ([place]) 07/09/2012   Presumed based on intermediate VQ 07/07/2012 and high clinical probability.  Definitive CT angiogram was not possible due to contrast  intolerance/allergy    Right shoulder pain 04/04/2015   Rotator cuff (capsule) sprain 03/13/2015   Seizure disorder, temporal lobe, intractable ([place]) 10/17/2014   Severe obesity (BMI >= 40) ([place]) 10/17/2014   Shingles 07/2013   Spinal stenosis of  lumbar region 12/17/2012   S/P suboptimal response to Chiropractry ;[name] Cisco referred her  to [name] [id number]; [name] [phone] MRI 12/15/12 : stenosis @ L3-4; L 4-5  in context of DDD ESI recommended . 12/17/12 Note: I recommended discussion with [name],Neurologist; & [name], Diabetologist    Type 2 diabetes, controlled, with peripheral neuropathy ([place]) 04/04/2009   Qualifier: Diagnosis of  By: [name] MD, [name]   [name], Endo [name] , Ophth seen anually     Variants of migraine, not elsewhere classified, without mention of intractable migraine without mention of status migrainosus 05/22/2013    Past Surgical  History:  Procedure Laterality Date   APPENDECTOMY     ARTERY BIOPSY Left 04/14/2018   Procedure: BIOPSY TEMPORAL ARTERY;  Surgeon: Elam Dutch, MD;  Location: [facility] OR;  Service: Vascular;  Laterality: Left;   BREAST EXCISIONAL BIOPSY Right 1998   CHOLECYSTECTOMY     COLONOSCOPY  2006   [facility]   rotator cuff surgery     TONSILLECTOMY       Family History  Problem Relation Age of Onset   Stomach cancer Father    Coronary artery disease Father    Breast cancer Sister 46       breast cancer   Diabetes Maternal Aunt    Diabetes Paternal Aunt    Prostate cancer Brother    Cancer Daughter    Stroke Neg Hx     Social History   Socioeconomic History   Marital status: Married    Spouse name: Nicole Mccann   Number of children: 2   Years of education: College   Highest education level: Not on file  Occupational History   Not on file  Tobacco Use   Smoking status: Never   Smokeless tobacco: Never  Substance and Sexual Activity   Alcohol use: Yes    Alcohol/week: 3.0 standard drinks of alcohol    Types: 3 Glasses of wine per week    Comment: 1/2 glass wine daily   Drug use: No   Sexual activity: Never  Other Topics Concern   Not on file  Social History Narrative   Patient is married Nicole Mccann) and lives at home with her husband.   Patient has two adult children.   Patient is retired.   Patient has a college education.   Patient is right-handed.   Patient drinks one cup of tea daily.   Social Determinants of Health   Financial Resource Strain: Low Risk  (06/09/2021)   Overall Financial Resource Strain (CARDIA)    Difficulty of Paying Living Expenses: Not hard at all  Food Insecurity: No Food Insecurity (06/09/2021)   Hunger Vital Sign    Worried About Running Out of Food in the Last Year: Never true    Ran Out of Food in the Last Year: Never true  Transportation Needs: No Transportation Needs (06/09/2021)   PRAPARE - Hydrologist (Medical): No     Lack of Transportation (Non-Medical): No  Physical Activity: Inactive (06/09/2021)   Exercise Vital Sign    Days of Exercise per Week: 0 days    Minutes of Exercise per Session: 0 min  Stress: No Stress Concern Present (06/09/2021)   [place]    Feeling of Stress : Not at all  Social Connections: Moderately Integrated (06/09/2021)   Social Connection and Isolation Panel [NHANES]    Frequency  of Communication with Friends and Family: More than three times a week    Frequency of Social Gatherings with Friends and Family: More than three times a week    Attends Religious Services: Never    Marine scientist or Organizations: Yes    Attends Archivist Meetings: 1 to 4 times per year    Marital Status: Married  Human resources officer Violence: Not At Risk (06/09/2021)   Humiliation, Afraid, Rape, and Kick questionnaire    Fear of Current or Ex-Partner: No    Emotionally Abused: No    Physically Abused: No    Sexually Abused: No    Outpatient Medications Prior to Visit  Medication Sig Dispense Refill   albuterol (VENTOLIN HFA) 108 (90 Base) MCG/ACT inhaler Inhale 2 puffs into the lungs every 6 (six) hours as needed for wheezing or shortness of breath. 18 g 6   Bacillus Coagulans-Inulin (PROBIOTIC) 1-250 BILLION-MG CAPS Take 1 tablet by mouth daily at 6 (six) AM.     bacitracin ointment Apply 1 Application topically 2 (two) times daily. 120 g 0   Budeson-Glycopyrrol-Formoterol (BREZTRI AEROSPHERE) 160-9-4.8 MCG/ACT AERO Inhale 2 puffs into the lungs in the morning and at bedtime. 4.8 g 0   budesonide-formoterol (SYMBICORT) 160-4.5 MCG/ACT inhaler Inhale 2 puffs into the lungs 2 (two) times daily. 1 each 12   Cholecalciferol (VITAMIN D) 50 MCG (2000 UT) CAPS Take 1 capsule by mouth daily at 6 (six) AM.     dapagliflozin propanediol (FARXIGA) 10 MG TABS tablet Take 1 tablet (10 mg total) by mouth daily before breakfast. 90  tablet 3   FLUoxetine (PROZAC) 20 MG capsule TAKE 1 CAPSULE BY MOUTH  DAILY 90 capsule 3   gabapentin (NEURONTIN) 100 MG capsule Take 1 capsule (100 mg total) by mouth 3 (three) times daily. 270 capsule 1   glipiZIDE (GLUCOTROL XL) 2.5 MG 24 hr tablet Take 1 tablet (2.5 mg total) by mouth daily with supper. 30 tablet 3   loratadine (CLARITIN) 10 MG tablet Take 10 mg by mouth daily.     rizatriptan (MAXALT) 10 MG tablet Take 1 tablet (10 mg total) by mouth as needed for migraine. May repeat in 2 hours if needed 12 tablet 3   rosuvastatin (CRESTOR) 10 MG tablet TAKE 1 TABLET BY MOUTH  DAILY 90 tablet 3   traMADol (ULTRAM) 50 MG tablet Take 1 tablet (50 mg total) by mouth every 6 (six) hours as needed. 12 tablet 0   cephALEXin (KEFLEX) 500 MG capsule Take 1 capsule (500 mg total) by mouth 3 (three) times daily. (Patient not taking: Reported on 12/21/2022) 15 capsule 0   No facility-administered medications prior to visit.    Allergies  Allergen Reactions   Mushroom Ext Cmplx(Shiitake-Reishi-Mait) Anaphylaxis   Shellfish Allergy Anaphylaxis   Sulfonamide Derivatives Anaphylaxis   Vancomycin Anaphylaxis   Fluticasone-Salmeterol Other (See Comments)    Feels like something in the throat   Butalbital-Aspirin-Caffeine Other (See Comments)    UNSPECIFIED REACTION    Keflex [Cephalexin]     Nausea/vomitting   Montelukast Sodium Other (See Comments)    Feels like she's running out of breath   Penicillins     UNSPECIFIED REACTION      Childhood allergy Has patient had a PCN reaction causing immediate rash, facial/tongue/throat swelling, SOB or lightheadedness with hypotension: Unknown Has patient had a PCN reaction causing severe rash involving mucus membranes or skin necrosis: Unknown Has patient had a PCN reaction that required hospitalization: No  Has patient had a PCN reaction occurring within the last 10 years: No If all of the above answers are "NO", then may proceed with Cephalosporin  use.     Acetaminophen Other (See Comments)    Stomach discomfort   Clarithromycin Nausea And Vomiting   Iodinated Contrast Media Other (See Comments)    Bad headache; no prep needed   Levofloxacin Diarrhea and Nausea And Vomiting    Dizziness Believes it was due to a high dosage of this medication   Orphenadrine Nausea And Vomiting    Review of Systems  Skin:        (+)skin tear on left ankle       Objective:    Physical Exam Constitutional:      General: She is not in acute distress.    Appearance: Normal appearance. She is not ill-appearing.  HENT:     Head: Normocephalic and atraumatic.     Right Ear: External ear normal.     Left Ear: External ear normal.  Eyes:     Extraocular Movements: Extraocular movements intact.     Pupils: Pupils are equal, round, and reactive to light.  Cardiovascular:     Rate and Rhythm: Normal rate and regular rhythm.     Heart sounds: Normal heart sounds. No murmur heard.    No gallop.  Pulmonary:     Effort: Pulmonary effort is normal. No respiratory distress.     Breath sounds: Normal breath sounds. No wheezing or rales.  Skin:    General: Skin is warm and dry.     Comments: Clean wound noted left medial shin  Neurological:     Mental Status: She is alert and oriented to person, place, and time.  Psychiatric:        Judgment: Judgment normal.      BP (!) 140/62 (BP Location: Left Arm, Patient Position: Sitting, Cuff Size: Small)   Pulse 68   Temp 98.4 F (36.9 C) (Oral)   Resp 18   Wt 274 lb (124.3 kg)   SpO2 98%   BMI 53.51 kg/m  Wt Readings from Last 3 Encounters:  12/21/22 274 lb (124.3 kg)  12/18/22 280 lb (127 kg)  11/26/22 280 lb 6.4 oz (127.2 kg)       Assessment & Plan:  Open wound of skin Assessment & Plan: It appears to be healing well. She definitely seems to intolerant to keflex.  Will d/c keflex and instead try doxycycline.  Antibiotic ointment was applied to wound and sterile dressing was applied.  Continue daily dressing changes at home. Daughter helps her. They will monitor the wound and call if increased redness, drainage or swelling occurs.    Other orders -     Doxycycline Hyclate; Take 1 tablet (100 mg total) by mouth 2 (two) times daily.  Dispense: 14 tablet; Refill: 0    I, Nance Pear, NP, personally preformed the services described in this documentation.  All medical record entries made by the scribe were at my direction and in my presence.  I have reviewed the chart and discharge instructions (if applicable) and agree that the record reflects my personal performance and is accurate and complete. 12/21/2022   I,Nicole Mccann,acting as a scribe for Nance Pear, NP.,have documented all relevant documentation on the behalf of Nance Pear, NP,as directed by  Nance Pear, NP while in the presence of Nance Pear, NP.   Nance Pear, NP

## 2022-12-22 DIAGNOSIS — T148XXA Other injury of unspecified body region, initial encounter: Secondary | ICD-10-CM | POA: Insufficient documentation

## 2022-12-22 NOTE — Assessment & Plan Note (Signed)
It appears to be healing well. She definitely seems to intolerant to keflex.  Will d/c keflex and instead try doxycycline.  Antibiotic ointment was applied to wound and sterile dressing was applied. Continue daily dressing changes at home. Daughter helps her. They will monitor the wound and call if increased redness, drainage or swelling occurs.

## 2022-12-25 NOTE — Progress Notes (Unsigned)
[place] at [facility] [address], [place], [place] [address] [phone]  Date:  12/29/2022   Name:  Nicole Mccann   DOB:  Dec 06, 1945   MRN:  [id number]  PCP:  Darreld Mclean, MD    Chief Complaint: No chief complaint on file.   History of Present Illness:  Nicole Mccann is a 77 y.o. very pleasant female patient who presents with the following:  Pt seen today for short term follow-up History of controlled diabetes with polyneuropathy, obesity, hyperlipidemia, seizure disorder, sleep apnea on CPAP, venous insufficiency with swelling of bilateral lower extremities, asthma  I last saw her in the office in November She was in the ER on 2/10 with a skin tear on her left lower leg with some evidence of infection The ER started her on keflex and updated her tetanus Seen by my partner Melissa on 2/13-  she was not tolerating keflex well so this was changed to doxycycline  Diabetes has been well controlled  Lab Results  Component Value Date   HGBA1C 6.4 (A) 11/26/2022    Patient Active Problem List   Diagnosis Date Noted   Open wound of skin 12/22/2022   Class 3 obesity with alveolar hypoventilation, serious comorbidity, and body mass index (BMI) of 50.0 to 59.9 in adult ([place]) 08/10/2022   Gait disorder 08/10/2022   SOB (shortness of breath) Q000111Q   Diastolic dysfunction Q000111Q   Chronic diastolic heart failure ([place]) 05/12/2021   Morbid obesity ([place]) 05/12/2021   PSVT (paroxysmal supraventricular tachycardia) 05/12/2021   Hypertriglyceridemia 05/12/2021   Diabetes mellitus due to underlying condition with hyperosmolarity without coma, without long-term current use of insulin ([place]) 05/12/2021   Olfactory aura    Low blood pressure    Hyperlipidemia    Depression    Anxiety    Allergy    Bilateral headaches 12/09/2020   Mixed hyperlipidemia 07/10/2019   Dizziness and giddiness 12/22/2016   Osteopenia  09/21/2016   Low back pain 05/26/2016   Right shoulder pain 04/04/2015   Adjustment disorder with mixed anxiety and depressed mood 03/14/2015   Rotator cuff (capsule) sprain 03/13/2015   Pseudoptosis 11/18/2014   Seizure disorder, temporal lobe, intractable ([place]) 10/17/2014   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/2014   Severe obesity (BMI >= 40) ([place]) 10/17/2014   OSA on CPAP 10/17/2014   Hypersomnia with sleep apnea, unspecified 05/22/2014   Injury of left knee 08/22/2013   Shingles 07/2013   Variants of migraine, not elsewhere classified, without mention of intractable migraine without mention of status migrainosus 05/22/2013   Disturbances of sensation of smell and taste 05/22/2013   Spinal stenosis of lumbar region 12/17/2012   Pulmonary embolism ([place]) 07/09/2012   DVT (deep venous thrombosis) ([place]) 07/07/2012   Atypical seizure ([place]) 06/07/2012   Phlebitis following infusion 06/05/2012   BENIGN POSITIONAL VERTIGO 11/06/2009   Type 2 diabetes, controlled, with peripheral neuropathy ([place]) 04/04/2009   Allergic rhinitis 04/04/2009   ASTHMA 04/04/2009   Diverticulosis of colon 04/04/2009    Past Medical History:  Diagnosis Date   Adjustment disorder with mixed anxiety and depressed mood 03/14/2015   Dr Albertine Patricia actively treating [place] 04/04/2009   Qualifier: Diagnosis of  By: [name] MD, [name]     Allergy    Anxiety    Dr Albertine Patricia   Asthma    ASTHMA 04/04/2009   Qualifier: Diagnosis of  By: [name] MD, [name]  Status Asthmaticus 10/2004     Atypical seizure ([place]) 06/07/2012   BENIGN POSITIONAL VERTIGO 11/06/2009   Qualifier: Diagnosis of  By: [name] MD, Catherine     Depression    Dr Albertine Patricia   Diabetes mellitus    Disturbances of sensation of smell and taste 05/22/2013   Diverticulosis of colon 04/04/2009   Qualifier: Diagnosis of  By: [name] MD, [name]     Dizziness and giddiness 12/22/2016   DVT (deep venous thrombosis) ([facility])    Headache(784.0)     Hyperlipidemia    Hypersomnia with sleep apnea, unspecified 05/22/2014   Injury of left knee 08/22/2013   [place]15[place]13 [place] contusion and abrasion of left knee. 08/17/13 seen in the emergency room; imaging negative. No labs. Referred to Dr. [name] who recommended nonsteroidals and elevation. 10/18-11/3/14 in [place] for land and river cruise trip.  08/29/13 seen in ER in [place]; topical antibiotics & oral Ciprobay Rxed. 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 Xarelto 15 mg initiated 06/05/12 by Dr [name] , [place] and increased to one pill twice a day. Despite this she had persistent thrombosis in the cephalic vein in the elbow and the upper forearm areas prompting change to Lovenox 110 mg e   Pulmonary embolism ([place]) 07/09/2012   Presumed based on intermediate VQ 07/07/2012 and high clinical probability.  Definitive CT angiogram was not possible due to contrast  intolerance/allergy    Right shoulder pain 04/04/2015   Rotator cuff (capsule) sprain 03/13/2015   Seizure disorder, temporal lobe, intractable ([place]) 10/17/2014   Severe obesity (BMI >= 40) ([place]) 10/17/2014   Shingles 07/2013   Spinal stenosis of lumbar region 12/17/2012   S/P suboptimal response to Chiropractry ;[name] Cisco referred her  to [name]  [facility] [phone]; FAX [phone] MRI 12/15/12 : stenosis @ L3-4; L 4-5  in context of DDD ESI recommended . 12/17/12 Note: I recommended discussion with [name],Neurologist; & [name], Diabetologist    Type 2 diabetes, controlled,  with peripheral neuropathy ([place]) 04/04/2009   Qualifier: Diagnosis of  By: [name] MD, [name]   [name], Endo [name] , [place] seen anually     Variants of migraine, not elsewhere classified, without mention of intractable migraine without mention of status migrainosus 05/22/2013    Past Surgical History:  Procedure Laterality Date   APPENDECTOMY     ARTERY BIOPSY Left 04/14/2018   Procedure: BIOPSY TEMPORAL ARTERY;  Surgeon: Elam Dutch, MD;  Location: [facility] OR;  Service: Vascular;  Laterality: Left;   BREAST EXCISIONAL BIOPSY Right 1998   CHOLECYSTECTOMY     COLONOSCOPY  2006   [facility]   rotator cuff surgery     TONSILLECTOMY       Social History   Tobacco Use   Smoking status: Never   Smokeless tobacco: Never  Substance Use Topics   Alcohol use: Yes    Alcohol/week: 3.0 standard drinks of alcohol    Types: 3 Glasses of wine per week  Comment: 1/2 glass wine daily   Drug use: No    Family History  Problem Relation Age of Onset   Stomach cancer Father    Coronary artery disease Father    Breast cancer Sister 10       breast cancer   Diabetes Maternal Aunt    Diabetes Paternal Aunt    Prostate cancer Brother    Cancer Daughter    Stroke Neg Hx     Allergies  Allergen Reactions   Mushroom Ext Cmplx([place]-Reishi-Mait) Anaphylaxis   Shellfish Allergy Anaphylaxis   Sulfonamide Derivatives Anaphylaxis   Vancomycin Anaphylaxis   Fluticasone-Salmeterol Other (See Comments)    Feels like something in the throat   Butalbital-Aspirin-Caffeine Other (See Comments)    UNSPECIFIED REACTION    Keflex [Cephalexin]     Nausea/vomitting   Montelukast Sodium Other (See Comments)    Feels like she's running out of breath   Penicillins     UNSPECIFIED REACTION      Childhood allergy Has patient had a PCN reaction causing immediate rash, facial/tongue/throat swelling, SOB or lightheadedness with hypotension: Unknown Has patient had a PCN reaction causing severe  rash involving mucus membranes or skin necrosis: Unknown Has patient had a PCN reaction that required hospitalization: No Has patient had a PCN reaction occurring within the last 10 years: No If all of the above answers are "NO", then may proceed with Cephalosporin use.     Acetaminophen Other (See Comments)    Stomach discomfort   Clarithromycin Nausea And Vomiting   Iodinated Contrast Media Other (See Comments)    Bad headache; no prep needed   Levofloxacin Diarrhea and Nausea And Vomiting    Dizziness Believes it was due to a high dosage of this medication   Orphenadrine Nausea And Vomiting    Medication list has been reviewed and updated.  Current Outpatient Medications on File Prior to Visit  Medication Sig Dispense Refill   albuterol (VENTOLIN HFA) 108 (90 Base) MCG/ACT inhaler Inhale 2 puffs into the lungs every 6 (six) hours as needed for wheezing or shortness of breath. 18 g 6   Bacillus Coagulans-Inulin (PROBIOTIC) 1-250 BILLION-MG CAPS Take 1 tablet by mouth daily at 6 (six) AM.     bacitracin ointment Apply 1 Application topically 2 (two) times daily. 120 g 0   Budeson-Glycopyrrol-Formoterol (BREZTRI AEROSPHERE) 160-9-4.8 MCG/ACT AERO Inhale 2 puffs into the lungs in the morning and at bedtime. 4.8 g 0   budesonide-formoterol (SYMBICORT) 160-4.5 MCG/ACT inhaler Inhale 2 puffs into the lungs 2 (two) times daily. 1 each 12   cephALEXin (KEFLEX) 500 MG capsule Take 1 capsule (500 mg total) by mouth 3 (three) times daily. (Patient not taking: Reported on 12/21/2022) 15 capsule 0   Cholecalciferol (VITAMIN D) 50 MCG (2000 UT) CAPS Take 1 capsule by mouth daily at 6 (six) AM.     dapagliflozin propanediol (FARXIGA) 10 MG TABS tablet Take 1 tablet (10 mg total) by mouth daily before breakfast. 90 tablet 3   doxycycline (VIBRA-TABS) 100 MG tablet Take 1 tablet (100 mg total) by mouth 2 (two) times daily. 14 tablet 0   FLUoxetine (PROZAC) 20 MG capsule TAKE 1 CAPSULE BY MOUTH  DAILY  90 capsule 3   gabapentin (NEURONTIN) 100 MG capsule Take 1 capsule (100 mg total) by mouth 3 (three) times daily. 270 capsule 1   glipiZIDE (GLUCOTROL XL) 2.5 MG 24 hr tablet Take 1 tablet (2.5 mg total) by mouth daily with supper. 30 tablet 3  loratadine (CLARITIN) 10 MG tablet Take 10 mg by mouth daily.     rizatriptan (MAXALT) 10 MG tablet Take 1 tablet (10 mg total) by mouth as needed for migraine. May repeat in 2 hours if needed 12 tablet 3   rosuvastatin (CRESTOR) 10 MG tablet TAKE 1 TABLET BY MOUTH  DAILY 90 tablet 3   traMADol (ULTRAM) 50 MG tablet Take 1 tablet (50 mg total) by mouth every 6 (six) hours as needed. 12 tablet 0   No current facility-administered medications on file prior to visit.    Review of Systems:  As per HPI- otherwise negative.   Physical Examination: There were no vitals filed for this visit. There were no vitals filed for this visit. There is no height or weight on file to calculate BMI. Ideal Body Weight:    GEN: no acute distress. HEENT: Atraumatic, Normocephalic.  Ears and Nose: No external deformity. CV: RRR, No M/G/R. No JVD. No thrill. No extra heart sounds. PULM: CTA B, no wheezes, crackles, rhonchi. No retractions. No resp. distress. No accessory muscle use. ABD: S, NT, ND, +BS. No rebound. No HSM. EXTR: No c/c/e PSYCH: Normally interactive. Conversant.    Assessment and Plan: ***  Signed Lamar Blinks, MD

## 2022-12-29 ENCOUNTER — Encounter: Payer: Self-pay | Admitting: Family Medicine

## 2022-12-29 ENCOUNTER — Other Ambulatory Visit: Payer: Self-pay | Admitting: Family Medicine

## 2022-12-29 ENCOUNTER — Ambulatory Visit: Payer: Medicare Other | Admitting: Family Medicine

## 2022-12-29 VITALS — BP 110/60 | HR 72 | Temp 97.8°F | Resp 18 | Ht 60.0 in | Wt 275.6 lb

## 2022-12-29 DIAGNOSIS — E1142 Type 2 diabetes mellitus with diabetic polyneuropathy: Secondary | ICD-10-CM

## 2022-12-29 DIAGNOSIS — T148XXA Other injury of unspecified body region, initial encounter: Secondary | ICD-10-CM | POA: Diagnosis not present

## 2022-12-29 DIAGNOSIS — Z1231 Encounter for screening mammogram for malignant neoplasm of breast: Secondary | ICD-10-CM

## 2022-12-29 NOTE — Patient Instructions (Signed)
Your skin infection is cleared up and the leg is healed!  For leg pain- ok to increase your gabapentin to three times a day- after a couple of weeks you can also increase your nighttime does to 200 mg (2 pills)

## 2023-02-14 ENCOUNTER — Ambulatory Visit: Payer: Medicare Other

## 2023-02-18 ENCOUNTER — Ambulatory Visit
Admission: RE | Admit: 2023-02-18 | Discharge: 2023-02-18 | Disposition: A | Discharge status: Home / Self Care | Payer: Medicare Other | Source: Ambulatory Visit | Attending: Family Medicine | Admitting: Family Medicine

## 2023-02-18 DIAGNOSIS — Z1231 Encounter for screening mammogram for malignant neoplasm of breast: Secondary | ICD-10-CM

## 2023-02-22 ENCOUNTER — Encounter: Payer: Self-pay | Admitting: Family Medicine

## 2023-02-22 DIAGNOSIS — L0390 Cellulitis, unspecified: Secondary | ICD-10-CM

## 2023-02-22 MED ORDER — DOXYCYCLINE HYCLATE 100 MG PO CAPS: 100.0000 mg | ORAL_CAPSULE | Freq: Two times a day (BID) | ORAL | 0 refills | Status: DC

## 2023-02-22 NOTE — Patient Instructions (Incomplete)
It was good to see you again today- we are going to set up a CT scan for later today

## 2023-02-22 NOTE — Progress Notes (Unsigned)
[facility] [address], [address] [place], [place] [address] [phone]  Date:  02/23/2023   Name:  Nicole Mccann   DOB:  07-16-1946   MRN:  [id number]  PCP:  Pearline Cables, MD    Chief Complaint: skin issue (Umbilical region/Concerns/ questions: cramping/ pain in lower left leg x 3-4 weeks)   History of Present Illness:  Nicole Mccann is a 77 y.o. very pleasant female patient who presents with the following:  Patient seen today with concern of probable umbilical cellulitis Most recent visit with myself was in February for a leg wound with some infection-she had a contusion with skin damage on her left leg which developed associated cellulitis  History of controlled diabetes with polyneuropathy, obesity, hyperlipidemia, seizure disorder, sleep apnea on CPAP, venous insufficiency with swelling of bilateral lower extremities, asthma  Her daughter contacted me yesterday with concern of umbilical cellulitis, we had her start on doxycycline and arranged appointment for today She noted a bump around the umbilicus a week or so ago and a feeling of tightness  She notes that she feel fine sitting with her legs bent, but she may have some discomfort if she lays down and stretches her legs out- like her belly is too tight.  She notes general discomfort in the center of her abdomen She noted a "red dot" near the umbilicus a few days ago, and then it started bleeding 2 days ago No fever or chills She is able to eat, drink, and have bowel movements normally Diabetes has been under good control-see recent A1c below  They also note "cramping "and redness of her left lower leg for 3-4 weeks  Lab Results  Component Value Date   HGBA1C 6.4 (A) 11/26/2022     Patient Active Problem List   Diagnosis Date Noted   Open wound of skin 12/22/2022   Class 3 obesity with alveolar hypoventilation, serious comorbidity, and body mass  index (BMI) of 50.0 to 59.9 in adult 08/10/2022   Gait disorder 08/10/2022   SOB (shortness of breath) 05/12/2021   Diastolic dysfunction 05/12/2021   Chronic diastolic heart failure 05/12/2021   Morbid obesity 05/12/2021   PSVT (paroxysmal supraventricular tachycardia) 05/12/2021   Hypertriglyceridemia 05/12/2021   Diabetes mellitus due to underlying condition with hyperosmolarity without coma, without long-term current use of insulin 05/12/2021   Olfactory aura    Low blood pressure    Hyperlipidemia    Depression    Anxiety    Allergy    Bilateral headaches 12/09/2020   Mixed hyperlipidemia 07/10/2019   Dizziness and giddiness 12/22/2016   Osteopenia 09/21/2016   Low back pain 05/26/2016   Right shoulder pain 04/04/2015   Adjustment disorder with mixed anxiety and depressed mood 03/14/2015   Rotator cuff (capsule) sprain 03/13/2015   Pseudoptosis 11/18/2014   Seizure disorder, temporal lobe, intractable 10/17/2014   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/2014   Severe obesity (BMI >= 40) (HCC) 10/17/2014   OSA on CPAP 10/17/2014   Hypersomnia with sleep apnea, unspecified 05/22/2014   Injury of left knee 08/22/2013   Shingles 07/2013   Variants of migraine, not elsewhere classified, without mention of intractable migraine without mention of status migrainosus 05/22/2013   Disturbances of sensation of smell and taste 05/22/2013   Spinal stenosis of lumbar region 12/17/2012   Pulmonary embolism 07/09/2012   DVT (deep venous thrombosis) 07/07/2012   Atypical seizure 06/07/2012   Phlebitis following infusion  06/05/2012   BENIGN POSITIONAL VERTIGO 11/06/2009   Type 2 diabetes, controlled, with peripheral neuropathy 04/04/2009   Allergic rhinitis 04/04/2009   ASTHMA 04/04/2009   Diverticulosis of colon 04/04/2009    Past Medical History:  Diagnosis Date   Adjustment disorder with mixed anxiety and depressed mood 03/14/2015   Dr Madaline Guthrie actively treating [place]    ALLERGIC  RHINITIS 04/04/2009   Qualifier: Diagnosis of  By: Alwyn Ren MD, Chrissie Noa     Allergy    Anxiety    Dr Madaline Guthrie   Asthma    ASTHMA 04/04/2009   Qualifier: Diagnosis of  By: Alwyn Ren MD, [name]   Status Asthmaticus 10/2004     Atypical seizure 06/07/2012   BENIGN POSITIONAL VERTIGO 11/06/2009   Qualifier: Diagnosis of  By: Linford Arnold MD, Catherine     Depression    Dr Madaline Guthrie   Diabetes mellitus    Disturbances of sensation of smell and taste 05/22/2013   Diverticulosis of colon 04/04/2009   Qualifier: Diagnosis of  By: Alwyn Ren MD, [name]     Dizziness and giddiness 12/22/2016   DVT (deep venous thrombosis)    Headache(784.0)    Hyperlipidemia    Hypersomnia with sleep apnea, unspecified 05/22/2014   Injury of left knee 08/22/2013   08[place]15/13 [place] contusion and abrasion of left knee. 08/17/13 seen in the emergency room; imaging negative. No labs. Referred to Dr. Pearletha Forge who recommended nonsteroidals and elevation. 10/18-11/3/14 in [place] for land and river cruise trip.  08/29/13 seen in ER in [place]; topical antibiotics & oral Ciprobay Rxed. 10/23 at second ER I&D declined ; wound   Low back pain 05/26/2016   Low blood pressure    Mixed hyperlipidemia 07/10/2019   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/2014   Olfactory aura    OSA on CPAP    Osteopenia 09/21/2016   dexa 09/2016   Phlebitis following infusion 06/05/2012   Onset 06/01/12;S/P Dilantin infusion 05/30/12 @ [facility] for atypical seizure activity with response 06/05/12 DVT of Cephalic vein elbow- mid upper arm. Xarelto 15 mg initiated 06/05/12 by Dr Jeraldine Loots , [facility] and increased to one pill twice a day. Despite this she had persistent thrombosis in the cephalic vein in the elbow and the upper forearm areas prompting change to Lovenox 110 mg e   Pulmonary embolism 07/09/2012   Presumed based on intermediate VQ 07/07/2012 and high clinical probability.  Definitive CT angiogram was not possible  due to contrast  intolerance/allergy    Right shoulder pain 04/04/2015   Rotator cuff (capsule) sprain 03/13/2015   Seizure disorder, temporal lobe, intractable 10/17/2014   Severe obesity (BMI >= 40) 10/17/2014   Shingles 07/2013   Spinal stenosis of lumbar region 12/17/2012   S/P suboptimal response to Chiropractry ;Dr Kai Levins referred her  to Dr Sherwood [facility] [phone]; FAX [phone] MRI 12/15/12 : stenosis @ L3-4; L 4-5  in context of DDD ESI recommended . 12/17/12 Note: I recommended discussion with [name],Neurologist; & Dr Margaretmary Bayley, Diabetologist    Type 2 diabetes, controlled, with peripheral neuropathy 04/04/2009   Qualifier: Diagnosis of  By: Alwyn Ren MD, Chrissie Noa   Dr Margaretmary Bayley, Endo Dr Emily Filbert , Ophth seen anually     Variants of migraine, not elsewhere classified, without mention of intractable migraine without mention of status migrainosus 05/22/2013    Past Surgical History:  Procedure Laterality Date   APPENDECTOMY     ARTERY BIOPSY Left 04/14/2018  Procedure: BIOPSY TEMPORAL ARTERY;  Surgeon: Sherren Kerns, MD;  Location: [facility] OR;  Service: Vascular;  Laterality: Left;   BREAST EXCISIONAL BIOPSY Right 1998   CHOLECYSTECTOMY     COLONOSCOPY  2006   [facility]   rotator cuff surgery     TONSILLECTOMY       Social History   Tobacco Use   Smoking status: Never   Smokeless tobacco: Never  Substance Use Topics   Alcohol use: Yes    Alcohol/week: 3.0 standard drinks of alcohol    Types: 3 Glasses of wine per week    Comment: 1/2 glass wine daily   Drug use: No    Family History  Problem Relation Age of Onset   Stomach cancer Father    Coronary artery disease Father    Breast cancer Sister 19       breast cancer   Diabetes Maternal Aunt    Diabetes Paternal Aunt    Prostate cancer Brother    Cancer Daughter    Stroke Neg Hx     Allergies  Allergen Reactions   Mushroom Ext Cmplx(Shiitake-Reishi-Mait) Anaphylaxis   Shellfish Allergy  Anaphylaxis   Sulfonamide Derivatives Anaphylaxis   Vancomycin Anaphylaxis   Fluticasone-Salmeterol Other (See Comments)    Feels like something in the throat   Butalbital-Aspirin-Caffeine Other (See Comments)    UNSPECIFIED REACTION    Keflex [Cephalexin]     Nausea/vomitting   Montelukast Sodium Other (See Comments)    Feels like she's running out of breath   Penicillins     UNSPECIFIED REACTION      Childhood allergy Has patient had a PCN reaction causing immediate rash, facial/tongue/throat swelling, SOB or lightheadedness with hypotension: Unknown Has patient had a PCN reaction causing severe rash involving mucus membranes or skin necrosis: Unknown Has patient had a PCN reaction that required hospitalization: No Has patient had a PCN reaction occurring within the last 10 years: No If all of the above answers are "NO", then may proceed with Cephalosporin use.     Acetaminophen Other (See Comments)    Stomach discomfort   Clarithromycin Nausea And Vomiting   Iodinated Contrast Media Other (See Comments)    Bad headache; no prep needed   Levofloxacin Diarrhea and Nausea And Vomiting    Dizziness Believes it was due to a high dosage of this medication   Orphenadrine Nausea And Vomiting    Medication list has been reviewed and updated.  Current Outpatient Medications on File Prior to Visit  Medication Sig Dispense Refill   albuterol (VENTOLIN HFA) 108 (90 Base) MCG/ACT inhaler Inhale 2 puffs into the lungs every 6 (six) hours as needed for wheezing or shortness of breath. 18 g 6   Bacillus Coagulans-Inulin (PROBIOTIC) 1-250 BILLION-MG CAPS Take 1 tablet by mouth daily at 6 (six) AM.     bacitracin ointment Apply 1 Application topically 2 (two) times daily. 120 g 0   Budeson-Glycopyrrol-Formoterol (BREZTRI AEROSPHERE) 160-9-4.8 MCG/ACT AERO Inhale 2 puffs into the lungs in the morning and at bedtime. 4.8 g 0   budesonide-formoterol (SYMBICORT) 160-4.5 MCG/ACT inhaler Inhale 2  puffs into the lungs 2 (two) times daily. 1 each 12   Cholecalciferol (VITAMIN D) 50 MCG (2000 UT) CAPS Take 1 capsule by mouth daily at 6 (six) AM.     dapagliflozin propanediol (FARXIGA) 10 MG TABS tablet Take 1 tablet (10 mg total) by mouth daily before breakfast. 90 tablet 3   doxycycline (VIBRAMYCIN) 100 MG capsule Take 1  capsule (100 mg total) by mouth 2 (two) times daily. 20 capsule 0   FLUoxetine (PROZAC) 20 MG capsule TAKE 1 CAPSULE BY MOUTH  DAILY 90 capsule 3   gabapentin (NEURONTIN) 100 MG capsule Take 1 capsule (100 mg total) by mouth 3 (three) times daily. 270 capsule 1   glipiZIDE (GLUCOTROL XL) 2.5 MG 24 hr tablet Take 1 tablet (2.5 mg total) by mouth daily with supper. 30 tablet 3   loratadine (CLARITIN) 10 MG tablet Take 10 mg by mouth daily.     rizatriptan (MAXALT) 10 MG tablet Take 1 tablet (10 mg total) by mouth as needed for migraine. May repeat in 2 hours if needed 12 tablet 3   rosuvastatin (CRESTOR) 10 MG tablet TAKE 1 TABLET BY MOUTH  DAILY 90 tablet 3   traMADol (ULTRAM) 50 MG tablet Take 1 tablet (50 mg total) by mouth every 6 (six) hours as needed. 12 tablet 0   No current facility-administered medications on file prior to visit.    Review of Systems:  As per HPI- otherwise negative.   Physical Examination: Vitals:   02/23/23 1006  BP: 124/76  Pulse: 78  Resp: 18  Temp: 97.8 F (36.6 C)  SpO2: 97%   Vitals:   02/23/23 1006  Weight: 283 lb 9.6 oz (128.6 kg)  Height: 5' (1.524 m)   Body mass index is 55.39 kg/m. Ideal Body Weight: Weight in (lb) to have BMI = 25: 127.7  GEN: no acute distress.  Significant central obesity, appears her normal self.  Accompanied today by her daughter HEENT: Atraumatic, Normocephalic.  Ears and Nose: No external deformity. CV: RRR, No M/G/R. No JVD. No thrill. No extra heart sounds. PULM: CTA B, no wheezes, crackles, rhonchi. No retractions. No resp. distress. No accessory muscle use. ABD: S,  ND, +BS. No rebound.  No HSM. EXTR: No c/c/e PSYCH: Normally interactive. Conversant.  There is evidence of cellulitis around her umbilicus with some moisture and discharge inside the umbilicus.  She also has evidence of an umbilical hernia with some surrounding tenderness.  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  I do not appreciate swelling or tenderness of the left calf at this time, no skin breakdown  Labs came back as below, reassuring Results for orders placed or performed in visit on 02/23/23  CBC  Result Value Ref Range   WBC 7.3 4.0 - 10.5 K/uL   RBC 4.91 3.87 - 5.11 Mil/uL   Platelets 192.0 150.0 - 400.0 K/uL   Hemoglobin 15.2 (H) 12.0 - 15.0 g/dL   HCT 16.1 09.6 - 04.5 %   MCV 93.4 78.0 - 100.0 fl   MCHC 33.2 30.0 - 36.0 g/dL   RDW 40.9 81.1 - 91.4 %  Comprehensive metabolic panel  Result Value Ref Range   Sodium 139 135 - 145 mEq/L   Potassium 4.2 3.5 - 5.1 mEq/L   Chloride 101 96 - 112 mEq/L   CO2 29 19 - 32 mEq/L   Glucose, Bld 161 (H) 70 - 99 mg/dL   BUN 15 6 - 23 mg/dL   Creatinine, Ser 7.82 0.40 - 1.20 mg/dL   Total Bilirubin 0.5 0.2 - 1.2 mg/dL   Alkaline Phosphatase 58 39 - 117 U/L   AST 17 0 - 37 U/L   ALT 20 0 - 35 U/L   Total Protein  6.4 6.0 - 8.3 g/dL   Albumin 4.2 3.5 - 5.2 g/dL   GFR 30.86 >57.84 mL/min   Calcium 9.1 8.4 - 10.5 mg/dL     Assessment and Plan: Cellulitis, umbilical - Plan: CBC, Comprehensive metabolic panel, CT Abdomen Pelvis W Contrast  Left leg pain - Plan: US Venous Img Lower Unilateral Left  Patient seen today with concern of umbilical cellulitis.  She started doxycycline for this yesterday.  However, I am concerned this may be more than just cellulitis-possible incarcerated umbilical hernia.  Will plan to obtain a CT scan today.  Will first have to obtain blood work as above to evaluate her renal  function  There is a "allergy" to iodinated contrast media listed.  However, on conversation patient notes contrast gives her a headache only.  She does not have history of actual allergic reaction Her daughter would also like to obtain a lower extremity ultrasound which we are glad to do to rule out DVT  Signed Abbe Amsterdam, MD  Received CT and [place] report, called her daughter to go over results No evidence of DVT She does have an umbilical hernia but it contains fat only.  I will call and discuss her situation with general surgery tomorrow-they may want to see her for consultation.  In the meantime she is asked to take her mother to the ER if her symptoms should worsen-otherwise we hope she will improve with doxycycline treatment for cellulitis CT Abdomen Pelvis W Contrast  Result Date: 02/23/2023 CLINICAL DATA:  Hernia suspected, abdominal wall pain and cellulitis at umbilicus EXAM: CT ABDOMEN AND PELVIS WITH CONTRAST TECHNIQUE: Multidetector CT imaging of the abdomen and pelvis was performed using the standard protocol following bolus administration of intravenous contrast. RADIATION DOSE REDUCTION: This exam was performed according to the departmental dose-optimization program which includes automated exposure control, adjustment of the mA and/or kV according to patient size and/or use of iterative reconstruction technique. CONTRAST:  OMNIPAQUE IOHEXOL 300 MG/ML  SOLN COMPARISON:  None Available. FINDINGS: Lower chest: No basilar airspace disease or pleural effusion. 4 mm pleural based left lower lobe nodule series 3, image 22. Hepatobiliary: Diffusely decreased hepatic density typical of steatosis. No focal liver lesion. Clips in the gallbladder fossa postcholecystectomy. No biliary dilatation. Pancreas: No ductal dilatation or inflammation. Spleen: Normal in size without focal abnormality. Adrenals/Urinary Tract: No adrenal nodule. No hydronephrosis or perinephric edema. Homogeneous renal  enhancement with symmetric excretion on delayed phase imaging. No renal calculi. Small bilateral renal cysts, needing no further imaging follow-up. Urinary bladder is physiologically distended without wall thickening. Stomach/Bowel: No bowel obstruction or inflammation. Prominent left colonic diverticulosis without diverticulitis. Moderate colonic stool burden. The appendix is not definitively seen. Vascular/Lymphatic: Aortic atherosclerosis. No aneurysm. Patent portal vein. No enlarged lymph nodes in the abdomen or pelvis. Reproductive: Uterus and bilateral adnexa are unremarkable. Other: Umbilical hernia small to moderate in size with hernia neck measuring 2.5 x 2.2 cm. Hernia sac contains only fat and measures 5.5 x 3.4 x 4 cm. There is no bowel involvement. No significant surrounding fat stranding or inflammation. No subcutaneous collection. No abdominopelvic ascites. No free air. Musculoskeletal: Moderate diffuse degenerative disc disease. Lower lumbar facet hypertrophy. There are no acute or suspicious osseous abnormalities. IMPRESSION: 1. Small to moderate umbilical hernia containing only fat. No bowel involvement. No adjacent inflammatory changes. 2. Hepatic steatosis. 3. Colonic diverticulosis without diverticulitis. Aortic Atherosclerosis (ICD10-I70.0). Electronically Signed   By: Narda Rutherford M.D.   On: 02/23/2023 19:32  US Venous Img Lower Unilateral Left  Result Date: 02/23/2023 CLINICAL DATA:  Cramping and erythema EXAM: Left LOWER EXTREMITY VENOUS DOPPLER ULTRASOUND TECHNIQUE: Gray-scale sonography with graded compression, as well as color Doppler and duplex ultrasound were performed to evaluate the lower extremity deep venous systems from the level of the common femoral vein and including the common femoral, femoral, profunda femoral, popliteal and calf veins including the posterior tibial, peroneal and gastrocnemius veins when visible. The superficial great saphenous vein was also  interrogated. Spectral Doppler was utilized to evaluate flow at rest and with distal augmentation maneuvers in the common femoral, femoral and popliteal veins. COMPARISON:  09/11/2013 FINDINGS: Contralateral Common Femoral Vein: Respiratory phasicity is normal and symmetric with the symptomatic side. No evidence of thrombus. Normal compressibility. Common Femoral Vein: No evidence of thrombus. Normal compressibility, respiratory phasicity and response to augmentation. Saphenofemoral Junction: No evidence of thrombus. Normal compressibility and flow on color Doppler imaging. Profunda Femoral Vein: No evidence of thrombus. Normal compressibility and flow on color Doppler imaging. Femoral Vein: No evidence of thrombus. Normal compressibility, respiratory phasicity and response to augmentation. Popliteal Vein: No evidence of thrombus. Normal compressibility, respiratory phasicity and response to augmentation. Calf Veins: No evidence of thrombus. Normal compressibility and flow on color Doppler imaging. Superficial Great Saphenous Vein: No evidence of thrombus. Normal compressibility. Venous Reflux:  None. Other Findings:  Examination was limited by body habitus. IMPRESSION: No evidence of deep venous thrombosis. Electronically Signed   By: Layla Maw M.D.   On: 02/23/2023 17:43    4/18 Called general surgery at [facility]; one of the surgeons very kindly reviewed her CT scan.  They noted some strangulated fat which can be painful but not acutely dangerous.  Will plan to have the patient seen at [facility] surgery for an evaluation within the next couple of weeks.  I called daughter [name] and passed along this information

## 2023-02-23 ENCOUNTER — Encounter: Payer: Self-pay | Admitting: Family Medicine

## 2023-02-23 ENCOUNTER — Ambulatory Visit (HOSPITAL_BASED_OUTPATIENT_CLINIC_OR_DEPARTMENT_OTHER)
Admission: RE | Admit: 2023-02-23 | Discharge: 2023-02-23 | Disposition: A | Discharge status: Home / Self Care | Payer: Medicare Other | Source: Ambulatory Visit | Attending: Family Medicine | Admitting: Family Medicine

## 2023-02-23 ENCOUNTER — Ambulatory Visit: Payer: Medicare Other | Admitting: Family Medicine

## 2023-02-23 VITALS — BP 124/76 | HR 78 | Temp 97.8°F | Resp 18 | Ht 60.0 in | Wt 283.6 lb

## 2023-02-23 DIAGNOSIS — L03316 Cellulitis of umbilicus: Secondary | ICD-10-CM

## 2023-02-23 DIAGNOSIS — M79605 Pain in left leg: Secondary | ICD-10-CM

## 2023-02-23 DIAGNOSIS — K420 Umbilical hernia with obstruction, without gangrene: Secondary | ICD-10-CM

## 2023-02-23 LAB — COMPREHENSIVE METABOLIC PANEL
ALT: 20 U/L (ref 0–35)
AST: 17 U/L (ref 0–37)
Albumin: 4.2 g/dL (ref 3.5–5.2)
Alkaline Phosphatase: 58 U/L (ref 39–117)
BUN: 15 mg/dL (ref 6–23)
CO2: 29 mEq/L (ref 19–32)
Calcium: 9.1 mg/dL (ref 8.4–10.5)
Chloride: 101 mEq/L (ref 96–112)
Creatinine, Ser: 0.88 mg/dL (ref 0.40–1.20)
GFR: 62.62 mL/min (ref >60.00)
Glucose, Bld: 161 mg/dL — ABNORMAL HIGH (ref 70–99)
Potassium: 4.2 mEq/L (ref 3.5–5.1)
Sodium: 139 mEq/L (ref 135–145)
Total Bilirubin: 0.5 mg/dL (ref 0.2–1.2)
Total Protein: 6.4 g/dL (ref 6.0–8.3)

## 2023-02-23 LAB — CBC
HCT: 45.9 % (ref 36.0–46.0)
Hemoglobin: 15.2 g/dL — ABNORMAL HIGH (ref 12.0–15.0)
MCHC: 33.2 g/dL (ref 30.0–36.0)
MCV: 93.4 fl (ref 78.0–100.0)
Platelets: 192 10*3/uL (ref 150.0–400.0)
RBC: 4.91 Mil/uL (ref 3.87–5.11)
RDW: 13.3 % (ref 11.5–15.5)
WBC: 7.3 10*3/uL (ref 4.0–10.5)

## 2023-02-23 MED ORDER — IOHEXOL 300 MG/ML  SOLN
125.0000 mL | Freq: Once | INTRAMUSCULAR | Status: AC | PRN
  Administered 2023-02-23: 125 mL via INTRAVENOUS

## 2023-02-24 ENCOUNTER — Ambulatory Visit: Payer: Medicare Other | Admitting: Family Medicine

## 2023-02-24 NOTE — Addendum Note (Signed)
Addended by: Abbe Amsterdam C on: 02/24/2023 12:24 PM   Modules accepted: Orders

## 2023-03-01 ENCOUNTER — Other Ambulatory Visit: Payer: Self-pay | Admitting: Internal Medicine

## 2023-03-01 ENCOUNTER — Other Ambulatory Visit: Payer: Self-pay | Admitting: Family Medicine

## 2023-03-01 DIAGNOSIS — E1142 Type 2 diabetes mellitus with diabetic polyneuropathy: Secondary | ICD-10-CM

## 2023-03-01 DIAGNOSIS — F339 Major depressive disorder, recurrent, unspecified: Secondary | ICD-10-CM

## 2023-03-07 ENCOUNTER — Encounter: Payer: Self-pay | Admitting: Family Medicine

## 2023-03-07 ENCOUNTER — Ambulatory Visit: Payer: Self-pay | Admitting: Surgery

## 2023-03-07 NOTE — H&P (Signed)
Subjective   Chief Complaint: New Consultation ( Umbilical hernia,)     History of Present Illness: Nicole Mccann is a 77 y.o. female who is seen today as an office consultation at the request of Dr. Patsy Lager for evaluation of New Consultation ( Umbilical hernia,) .   This is a 77 year old female with DM 2, morbid obesity, sleep apnea requiring CPAP who presents with recent onset of umbilical pain and erythema.  She was evaluated by Dr. Patsy Lager who diagnosed her with an umbilical hernia with some overlying cellulitis.  She was treated with oral antibiotics and the erythema has resolved.  CT scan showed an umbilical hernia containing fat with a fascial defect about 2.6 cm.  CT also showed some hepatic steatosis.  The patient is status post open cholecystectomy.  The patient reports pain in this area with palpation.  She did suffer a fall several months ago in which she landed on her abdomen.  Review of Systems: A complete review of systems was obtained from the patient.  I have reviewed this information and discussed as appropriate with the patient.  See HPI as well for other ROS.  Review of Systems  Constitutional: Negative.   HENT: Negative.    Eyes: Negative.   Respiratory: Negative.    Cardiovascular: Negative.   Gastrointestinal:  Positive for abdominal pain.  Genitourinary: Negative.   Musculoskeletal: Negative.   Skin: Negative.   Neurological: Negative.   Endo/Heme/Allergies: Negative.   Psychiatric/Behavioral: Negative.        Medical History: Past Medical History:  Diagnosis Date   Arthritis    Asthma, unspecified asthma severity, unspecified whether complicated, unspecified whether persistent (HHS-HCC)    Diabetes mellitus without complication (CMS/HHS-HCC)    Sleep apnea     Patient Active Problem List  Diagnosis   Adjustment disorder with mixed anxiety and depressed mood   Allergic rhinitis   Anxiety   Asthma (HHS-HCC)   Benign positional vertigo    Chronic diastolic heart failure (CMS/HHS-HCC)   Diabetes mellitus due to underlying condition with hyperosmolarity without coma, without long-term current use of insulin (CMS/HHS-HCC)   Depression   Diastolic dysfunction   DVT (deep venous thrombosis) (CMS/HHS-HCC)   Hyperlipidemia   Class 3 obesity with alveolar hypoventilation, serious comorbidity, and body mass index (BMI) of 50.0 to 59.9 in adult (CMS/HHS-HCC)   OSA on CPAP   PSVT (paroxysmal supraventricular tachycardia)   Seizure disorder, temporal lobe, intractable (CMS/HHS-HCC)   Severe obesity (BMI >= 40) (CMS/HHS-HCC)   SOB (shortness of breath)   Superficial thrombophlebitis    Past Surgical History:  Procedure Laterality Date   APPENDECTOMY     ARTHROSCOPIC ROTATOR CUFF REPAIR     LAPAROSCOPIC CHOLECYSTECTOMY       Allergies  Allergen Reactions   Penicillins Anaphylaxis   Iodinated Contrast Media Other (See Comments)    Bad head ahce   Acetaminophen Other (See Comments)    Stomach discomfort   Butalbital-Acetaminop-Caf-Cod Other (See Comments) and Nausea And Vomiting   Butalbital-Aspirin-Caffeine Other (See Comments)    unknown   Clarithromycin Angioedema   Fluticasone Propion-Salmeterol Other (See Comments)   Montelukast Sodium Other (See Comments)    Cannot recall   Moxifloxacin Angioedema   Mushroom Flavor Angioedema   Orphenadrine Citrate Other (See Comments) and Nausea And Vomiting   Shellfish Containing Products Angioedema   Sulfamethoxazole Angioedema    Current Outpatient Medications on File Prior to Visit  Medication Sig Dispense Refill   albuterol MDI, PROVENTIL, VENTOLIN, PROAIR, HFA 90  mcg/actuation inhaler Inhale 2 inhalations into the lungs     FLUoxetine (PROZAC) 20 MG capsule      gabapentin (NEURONTIN) 100 MG capsule      rizatriptan (MAXALT-MLT) 10 MG disintegrating tablet Take 10 mg by mouth as directed for Migraine May take a second dose after 2 hours if needed.     rosuvastatin  (CRESTOR) 10 MG tablet      No current facility-administered medications on file prior to visit.    Family History  Problem Relation Age of Onset   Obesity Father    Breast cancer Sister      Social History   Tobacco Use  Smoking Status Never  Smokeless Tobacco Never     Social History   Socioeconomic History   Marital status: Married  Tobacco Use   Smoking status: Never   Smokeless tobacco: Never  Substance and Sexual Activity   Alcohol use: Yes    Alcohol/week: 5.0 standard drinks of alcohol    Types: 5 Glasses of wine per week   Drug use: Never   Social Determinants of Health   Financial Resource Strain: Low Risk  (02/22/2023)   Received from [facility]   Overall Financial Resource Strain (CARDIA)    Difficulty of Paying Living Expenses: Not very hard  Food Insecurity: No Food Insecurity (02/22/2023)   Received from [facility]   Hunger Vital Sign    Worried About Running Out of Food in the Last Year: Never true    Ran Out of Food in the Last Year: Never true  Transportation Needs: No Transportation Needs (02/22/2023)   Received from [facility] - Transportation    Lack of Transportation (Medical): No    Lack of Transportation (Non-Medical): No  Physical Activity: Unknown (02/22/2023)   Received from [facility]   Exercise Vital Sign    Days of Exercise per Week: 0 days  Stress: Stress Concern Present (02/22/2023)   Received from [facility] - Occupational Stress Questionnaire    Feeling of Stress : Rather much  Social Connections: Socially Isolated (02/22/2023)   Received from [facility]   Social Connection and Isolation Panel [NHANES]    Frequency of Communication with Friends and Family: Once a week    Frequency of Social Gatherings with Friends and Family: Once a week    Attends Religious Services: Never    Database administrator or Organizations: No    Marital Status: Married    Objective:     Vitals:   03/07/23 1101  BP: 139/82  Pulse: 74  Temp: 36.7 C (98 F)  SpO2: 96%  Weight: (!) 128.4 kg (283 lb)  Height: 152.4 cm (5')    Body mass index is 55.27 kg/m.  Physical Exam   Constitutional:  WDWN in NAD, conversant, no obvious deformities; lying in bed comfortably Eyes:  Pupils equal, round; sclera anicteric; moist conjunctiva; no lid lag HENT:  Oral mucosa moist; good dentition  Neck:  No masses palpated, trachea midline; no thyromegaly Lungs:  CTA bilaterally; normal respiratory effort CV:  Regular rate and rhythm; no murmurs; extremities well-perfused with no edema Abd:  +bowel sounds, morbidly obese, soft,  no palpable organomegaly; protruding umbilical hernia at the upper edge of the umbilicus.  Not reducible.  Patient has some foul-smelling debris in the crevice around her umbilical hernia.  We cleaned this out and cleaned the area with ChloraPrep. Musc: Unsteady gait, walks with walker.  No apparent clubbing or cyanosis in extremities Lymphatic:  No palpable cervical or axillary lymphadenopathy Skin:  Warm, dry; no sign of jaundice Psychiatric - alert and oriented x 4; calm mood and affect   Labs, Imaging and Diagnostic Testing: Narrative & Impression  CLINICAL DATA:  Hernia suspected, abdominal wall pain and cellulitis at umbilicus   EXAM: CT ABDOMEN AND PELVIS WITH CONTRAST   TECHNIQUE: Multidetector CT imaging of the abdomen and pelvis was performed using the standard protocol following bolus administration of intravenous contrast.   RADIATION DOSE REDUCTION: This exam was performed according to the departmental dose-optimization program which includes automated exposure control, adjustment of the mA and/or kV according to patient size and/or use of iterative reconstruction technique.   CONTRAST:  OMNIPAQUE IOHEXOL 300 MG/ML  SOLN   COMPARISON:  None Available.   FINDINGS: Lower chest: No basilar airspace disease or pleural effusion.  4 mm pleural based left lower lobe nodule series 3, image 22.   Hepatobiliary: Diffusely decreased hepatic density typical of steatosis. No focal liver lesion. Clips in the gallbladder fossa postcholecystectomy. No biliary dilatation.   Pancreas: No ductal dilatation or inflammation.   Spleen: Normal in size without focal abnormality.   Adrenals/Urinary Tract: No adrenal nodule. No hydronephrosis or perinephric edema. Homogeneous renal enhancement with symmetric excretion on delayed phase imaging. No renal calculi. Small bilateral renal cysts, needing no further imaging follow-up. Urinary bladder is physiologically distended without wall thickening.   Stomach/Bowel: No bowel obstruction or inflammation. Prominent left colonic diverticulosis without diverticulitis. Moderate colonic stool burden. The appendix is not definitively seen.   Vascular/Lymphatic: Aortic atherosclerosis. No aneurysm. Patent portal vein. No enlarged lymph nodes in the abdomen or pelvis.   Reproductive: Uterus and bilateral adnexa are unremarkable.   Other: Umbilical hernia small to moderate in size with hernia neck measuring 2.5 x 2.2 cm. Hernia sac contains only fat and measures 5.5 x 3.4 x 4 cm. There is no bowel involvement. No significant surrounding fat stranding or inflammation. No subcutaneous collection. No abdominopelvic ascites. No free air.   Musculoskeletal: Moderate diffuse degenerative disc disease. Lower lumbar facet hypertrophy. There are no acute or suspicious osseous abnormalities.   IMPRESSION: 1. Small to moderate umbilical hernia containing only fat. No bowel involvement. No adjacent inflammatory changes. 2. Hepatic steatosis. 3. Colonic diverticulosis without diverticulitis.   Aortic Atherosclerosis (ICD10-I70.0).     Electronically Signed   By: Narda Rutherford M.D.   On: 02/23/2023 19:32      Assessment and Plan:  Diagnoses and all orders for this visit:  Umbilical  hernia without obstruction or gangrene    Despite the patient's significant comorbidities, would recommend umbilical hernia repair with mesh as the patient has become much more symptomatic.  She risks of incarceration and strangulation of the bowel if the hernia continues to enlarge.  Also, she is likely to develop skin breakdown due to inability to perform adequate hygiene in this area.  We will obtain cardiac clearance and then we will schedule her for umbilical hernia repair with mesh.  We will keep her overnight after surgery.   Anilah Huck Delbert Harness, MD  03/07/2023 11:51 AM

## 2023-03-07 NOTE — H&P (View-Only) (Signed)
Subjective   Chief Complaint: New Consultation ( Umbilical hernia,)     History of Present Illness: Nicole Mccann is a 77 y.o. female who is seen today as an office consultation at the request of Dr. Copland for evaluation of New Consultation ( Umbilical hernia,) .   This is a 77-year-old female with DM 2, morbid obesity, sleep apnea requiring CPAP who presents with recent onset of umbilical pain and erythema.  She was evaluated by Dr. Copland who diagnosed her with an umbilical hernia with some overlying cellulitis.  She was treated with oral antibiotics and the erythema has resolved.  CT scan showed an umbilical hernia containing fat with a fascial defect about 2.6 cm.  CT also showed some hepatic steatosis.  The patient is status post open cholecystectomy.  The patient reports pain in this area with palpation.  She did suffer a fall several months ago in which she landed on her abdomen.  Review of Systems: A complete review of systems was obtained from the patient.  I have reviewed this information and discussed as appropriate with the patient.  See HPI as well for other ROS.  Review of Systems  Constitutional: Negative.   HENT: Negative.    Eyes: Negative.   Respiratory: Negative.    Cardiovascular: Negative.   Gastrointestinal:  Positive for abdominal pain.  Genitourinary: Negative.   Musculoskeletal: Negative.   Skin: Negative.   Neurological: Negative.   Endo/Heme/Allergies: Negative.   Psychiatric/Behavioral: Negative.        Medical History: Past Medical History:  Diagnosis Date   Arthritis    Asthma, unspecified asthma severity, unspecified whether complicated, unspecified whether persistent (HHS-HCC)    Diabetes mellitus without complication (CMS/HHS-HCC)    Sleep apnea     Patient Active Problem List  Diagnosis   Adjustment disorder with mixed anxiety and depressed mood   Allergic rhinitis   Anxiety   Asthma (HHS-HCC)   Benign positional vertigo    Chronic diastolic heart failure (CMS/HHS-HCC)   Diabetes mellitus due to underlying condition with hyperosmolarity without coma, without long-term current use of insulin (CMS/HHS-HCC)   Depression   Diastolic dysfunction   DVT (deep venous thrombosis) (CMS/HHS-HCC)   Hyperlipidemia   Class 3 obesity with alveolar hypoventilation, serious comorbidity, and body mass index (BMI) of 50.0 to 59.9 in adult (CMS/HHS-HCC)   OSA on CPAP   PSVT (paroxysmal supraventricular tachycardia)   Seizure disorder, temporal lobe, intractable (CMS/HHS-HCC)   Severe obesity (BMI >= 40) (CMS/HHS-HCC)   SOB (shortness of breath)   Superficial thrombophlebitis    Past Surgical History:  Procedure Laterality Date   APPENDECTOMY     ARTHROSCOPIC ROTATOR CUFF REPAIR     LAPAROSCOPIC CHOLECYSTECTOMY       Allergies  Allergen Reactions   Penicillins Anaphylaxis   Iodinated Contrast Media Other (See Comments)    Bad head ahce   Acetaminophen Other (See Comments)    Stomach discomfort   Butalbital-Acetaminop-Caf-Cod Other (See Comments) and Nausea And Vomiting   Butalbital-Aspirin-Caffeine Other (See Comments)    unknown   Clarithromycin Angioedema   Fluticasone Propion-Salmeterol Other (See Comments)   Montelukast Sodium Other (See Comments)    Cannot recall   Moxifloxacin Angioedema   Mushroom Flavor Angioedema   Orphenadrine Citrate Other (See Comments) and Nausea And Vomiting   Shellfish Containing Products Angioedema   Sulfamethoxazole Angioedema    Current Outpatient Medications on File Prior to Visit  Medication Sig Dispense Refill   albuterol MDI, PROVENTIL, VENTOLIN, PROAIR, HFA 90   mcg/actuation inhaler Inhale 2 inhalations into the lungs     FLUoxetine (PROZAC) 20 MG capsule      gabapentin (NEURONTIN) 100 MG capsule      rizatriptan (MAXALT-MLT) 10 MG disintegrating tablet Take 10 mg by mouth as directed for Migraine May take a second dose after 2 hours if needed.     rosuvastatin  (CRESTOR) 10 MG tablet      No current facility-administered medications on file prior to visit.    Family History  Problem Relation Age of Onset   Obesity Father    Breast cancer Sister      Social History   Tobacco Use  Smoking Status Never  Smokeless Tobacco Never     Social History   Socioeconomic History   Marital status: Married  Tobacco Use   Smoking status: Never   Smokeless tobacco: Never  Substance and Sexual Activity   Alcohol use: Yes    Alcohol/week: 5.0 standard drinks of alcohol    Types: 5 Glasses of wine per week   Drug use: Never   Social Determinants of Health   Financial Resource Strain: Low Risk  (02/22/2023)   Received from [place]   Overall Financial Resource Strain (CARDIA)    Difficulty of Paying Living Expenses: Not very hard  Food Insecurity: No Food Insecurity (02/22/2023)   Received from [place]   Hunger Vital Sign    Worried About Running Out of Food in the Last Year: Never true    Ran Out of Food in the Last Year: Never true  Transportation Needs: No Transportation Needs (02/22/2023)   Received from [place]   [facility] - Transportation    Lack of Transportation (Medical): No    Lack of Transportation (Non-Medical): No  Physical Activity: Unknown (02/22/2023)   Received from [place]   Exercise Vital Sign    Days of Exercise per Week: 0 days  Stress: Stress Concern Present (02/22/2023)   Received from [place]   [employer] [facility] - Occupational Stress Questionnaire    Feeling of Stress : Rather much  Social Connections: Socially Isolated (02/22/2023)   Received from [place]   Social Connection and Isolation Panel [NHANES]    Frequency of Communication with Friends and Family: Once a week    Frequency of Social Gatherings with Friends and Family: Once a week    Attends Religious Services: Never    Active Member of Clubs or Organizations: No    Marital Status: Married    Objective:     Vitals:   03/07/23 1101  BP: 139/82  Pulse: 74  Temp: 36.7 C (98 F)  SpO2: 96%  Weight: (!) 128.4 kg (283 lb)  Height: 152.4 cm (5')    Body mass index is 55.27 kg/m.  Physical Exam   Constitutional:  WDWN in NAD, conversant, no obvious deformities; lying in bed comfortably Eyes:  Pupils equal, round; sclera anicteric; moist conjunctiva; no lid lag HENT:  Oral mucosa moist; good dentition  Neck:  No masses palpated, trachea midline; no thyromegaly Lungs:  CTA bilaterally; normal respiratory effort CV:  Regular rate and rhythm; no murmurs; extremities well-perfused with no edema Abd:  +bowel sounds, morbidly obese, soft,  no palpable organomegaly; protruding umbilical hernia at the upper edge of the umbilicus.  Not reducible.  Patient has some foul-smelling debris in the crevice around her umbilical hernia.  We cleaned this out and cleaned the area with ChloraPrep. Musc: Unsteady gait, walks with walker.    No apparent clubbing or cyanosis in extremities Lymphatic:  No palpable cervical or axillary lymphadenopathy Skin:  Warm, dry; no sign of jaundice Psychiatric - alert and oriented x 4; calm mood and affect   Labs, Imaging and Diagnostic Testing: Narrative & Impression  CLINICAL DATA:  Hernia suspected, abdominal wall pain and cellulitis at umbilicus   EXAM: CT ABDOMEN AND PELVIS WITH CONTRAST   TECHNIQUE: Multidetector CT imaging of the abdomen and pelvis was performed using the standard protocol following bolus administration of intravenous contrast.   RADIATION DOSE REDUCTION: This exam was performed according to the departmental dose-optimization program which includes automated exposure control, adjustment of the mA and/or kV according to patient size and/or use of iterative reconstruction technique.   CONTRAST:  125mL OMNIPAQUE IOHEXOL 300 MG/ML  SOLN   COMPARISON:  None Available.   FINDINGS: Lower chest: No basilar airspace disease or pleural effusion.  4 mm pleural based left lower lobe nodule series 3, image 22.   Hepatobiliary: Diffusely decreased hepatic density typical of steatosis. No focal liver lesion. Clips in the gallbladder fossa postcholecystectomy. No biliary dilatation.   Pancreas: No ductal dilatation or inflammation.   Spleen: Normal in size without focal abnormality.   Adrenals/Urinary Tract: No adrenal nodule. No hydronephrosis or perinephric edema. Homogeneous renal enhancement with symmetric excretion on delayed phase imaging. No renal calculi. Small bilateral renal cysts, needing no further imaging follow-up. Urinary bladder is physiologically distended without wall thickening.   Stomach/Bowel: No bowel obstruction or inflammation. Prominent left colonic diverticulosis without diverticulitis. Moderate colonic stool burden. The appendix is not definitively seen.   Vascular/Lymphatic: Aortic atherosclerosis. No aneurysm. Patent portal vein. No enlarged lymph nodes in the abdomen or pelvis.   Reproductive: Uterus and bilateral adnexa are unremarkable.   Other: Umbilical hernia small to moderate in size with hernia neck measuring 2.5 x 2.2 cm. Hernia sac contains only fat and measures 5.5 x 3.4 x 4 cm. There is no bowel involvement. No significant surrounding fat stranding or inflammation. No subcutaneous collection. No abdominopelvic ascites. No free air.   Musculoskeletal: Moderate diffuse degenerative disc disease. Lower lumbar facet hypertrophy. There are no acute or suspicious osseous abnormalities.   IMPRESSION: 1. Small to moderate umbilical hernia containing only fat. No bowel involvement. No adjacent inflammatory changes. 2. Hepatic steatosis. 3. Colonic diverticulosis without diverticulitis.   Aortic Atherosclerosis (ICD10-I70.0).     Electronically Signed   By: Melanie  Sanford M.D.   On: 02/23/2023 19:32      Assessment and Plan:  Diagnoses and all orders for this visit:  Umbilical  hernia without obstruction or gangrene    Despite the patient's significant comorbidities, would recommend umbilical hernia repair with mesh as the patient has become much more symptomatic.  She risks of incarceration and strangulation of the bowel if the hernia continues to enlarge.  Also, she is likely to develop skin breakdown due to inability to perform adequate hygiene in this area.  We will obtain cardiac clearance and then we will schedule her for umbilical hernia repair with mesh.  We will keep her overnight after surgery.   Bernardine Langworthy KAI Tanganika Barradas, MD  03/07/2023 11:51 AM 

## 2023-03-08 ENCOUNTER — Telehealth: Payer: Self-pay | Admitting: *Deleted

## 2023-03-08 NOTE — Telephone Encounter (Signed)
   Pre-operative Risk Assessment    Patient Name: Nicole Mccann  DOB: 1946/11/01 MRN: [id number]     Request for Surgical Clearance    Procedure:   Umbilical hernia surgery  Date of Surgery:  Clearance TBD                                 Surgeon:  Dr. Manus Rudd Surgeon's Group or Practice Name:  [facility] Phone number:  [phone] Fax number:  [phone]   Type of Clearance Requested:   - Medical    Type of Anesthesia:  General    Additional requests/questions:    Signed, Emmit Pomfret   03/08/2023, 8:17 AM

## 2023-03-08 NOTE — Progress Notes (Signed)
Cardiology Clinic Note   Patient Name: Nicole Mccann Date of Encounter: 03/11/2023  Primary Care Provider:  Pearline Cables, MD Primary Cardiologist:  Thomasene Ripple, DO  Patient Profile    Nicole Mccann 77 year old female with history of hyperlipidemia, anxiety, OSA on CPAP, type 2 diabetes, and asthma.  Was last seen in the office by Dr. Servando Salina on 05/12/2021 with complaints of shortness of breath.  She was advised to take her Lasix 3 times daily because of lower extremity edema.  She is here for cardiac preoperative evaluation to have umbilical hernia repair by [facility], Dr. Manus Rudd on TBD.   Past Medical History    Past Medical History:  Diagnosis Date   Adjustment disorder with mixed anxiety and depressed mood 03/14/2015   Dr Madaline Guthrie actively treating [place]    ALLERGIC RHINITIS 04/04/2009   Qualifier: Diagnosis of  By: Alwyn Ren MD, Chrissie Noa     Allergy    Anxiety    Dr Madaline Guthrie   Asthma    ASTHMA 04/04/2009   Qualifier: Diagnosis of  By: Alwyn Ren MD, [name]   Status Asthmaticus 10/2004     Atypical seizure (HCC) 06/07/2012   BENIGN POSITIONAL VERTIGO 11/06/2009   Qualifier: Diagnosis of  By: Linford Arnold MD, Catherine     Depression    Dr Madaline Guthrie   Diabetes mellitus    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 08/17/13 seen in the emergency room; imaging negative. No labs. Referred to Dr. Pearletha Forge who recommended nonsteroidals and elevation. 10/18-11/3/14 in [place] for land and river cruise trip.  08/29/13 seen in ER in [place]; topical antibiotics & oral Ciprobay Rxed.  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 Xarelto 15 mg initiated 06/05/12 by Dr Jeraldine Loots , [facility] and increased to one pill twice a day. Despite this she had persistent thrombosis in the cephalic vein in the elbow and the upper forearm areas prompting change to Lovenox 110 mg e   Pulmonary embolism (HCC) 07/09/2012   Presumed based on intermediate VQ 07/07/2012 and high clinical probability.  Definitive CT angiogram was not possible due to contrast  intolerance/allergy    Right shoulder pain 04/04/2015   Rotator cuff (capsule) sprain 03/13/2015   Seizure disorder, temporal lobe, intractable (HCC) 10/17/2014   Severe obesity (BMI >= 40) (HCC) 10/17/2014   Shingles 07/2013   Spinal stenosis of lumbar region 12/17/2012   S/P suboptimal response to Chiropractry ;Dr Kai Levins referred her  to Dr Sherwood [facility] [phone]; FAX [phone] MRI 12/15/12 : stenosis @ L3-4; L 4-5  in context of DDD ESI recommended . 12/17/12 Note: I recommended discussion  with [name],Neurologist; & Dr Margaretmary Bayley, Diabetologist    Type 2 diabetes, controlled, with peripheral neuropathy (HCC) 04/04/2009   Qualifier: Diagnosis of  By: Alwyn Ren MD, Chrissie Noa   Dr Margaretmary Bayley, Endo Dr Emily Filbert , Ophth seen anually     Variants of migraine, not elsewhere classified, without mention of intractable migraine without mention of status migrainosus 05/22/2013   Past Surgical History:  Procedure Laterality Date   APPENDECTOMY     ARTERY BIOPSY Left 04/14/2018   Procedure: BIOPSY TEMPORAL ARTERY;  Surgeon: Sherren Kerns, MD;   Location: [facility] OR;  Service: Vascular;  Laterality: Left;   BREAST EXCISIONAL BIOPSY Right 1998   CHOLECYSTECTOMY     COLONOSCOPY  2006   [facility]   rotator cuff surgery     TONSILLECTOMY       Allergies  Allergies  Allergen Reactions   Mushroom Ext Cmplx(Shiitake-Reishi-Mait) Anaphylaxis   Shellfish Allergy Anaphylaxis   Sulfonamide Derivatives Anaphylaxis   Vancomycin Anaphylaxis   Fluticasone-Salmeterol Other (See Comments)    Feels like something in the throat   Butalbital-Aspirin-Caffeine Other (See Comments)    UNSPECIFIED REACTION    Keflex [Cephalexin]     Nausea/vomitting   Montelukast Sodium Other (See Comments)    Feels like she's running out of breath   Penicillins     UNSPECIFIED REACTION      Childhood allergy Has patient had a PCN reaction causing immediate rash, facial/tongue/throat swelling, SOB or lightheadedness with hypotension: Unknown Has patient had a PCN reaction causing severe rash involving mucus membranes or skin necrosis: Unknown Has patient had a PCN reaction that required hospitalization: No Has patient had a PCN reaction occurring within the last 10 years: No If all of the above answers are "NO", then may proceed with Cephalosporin use.     Acetaminophen Other (See Comments)    Stomach discomfort   Clarithromycin Nausea And Vomiting   Iodinated Contrast Media Other (See Comments)    Bad headache; no prep needed   Levofloxacin Diarrhea and Nausea And Vomiting    Dizziness Believes it was due to a high dosage of this medication   Orphenadrine Nausea And Vomiting    History of Present Illness    Nicole Mccann comes today with her daughter, for preoperative evaluation for hernia repair.  The patient is not very active due to severe lumbar stenosis, and pain in her legs.  She uses a walker when she is out in public and a cane at home for ambulation.  She also has chronic asthma and does use inhaler on occasion.  She complains of "pain" which I  believe is related to her CPAP.  She states she awakens sometimes in the middle the night due to the gas pain sits up takes off her CPAP and allows gas to be expelled.  She feels better and is able to go back to sleep.  She denies any cardiac symptoms of severe chest pain, increased shortness of breath, dizziness, or significant fatigue.  She has chronic venous insufficiency from dependent edema.  Of note, the patient states during last surgery she had a significant anaphylaxis reaction upon wakening in recovery room.  Home Medications    Current Outpatient Medications  Medication Sig Dispense Refill   albuterol (VENTOLIN HFA) 108 (90 Base) MCG/ACT inhaler Inhale 2 puffs into the lungs every 6 (six) hours as needed for wheezing or shortness of breath. 18 g 6   Bacillus Coagulans-Inulin (PROBIOTIC) 1-250 BILLION-MG CAPS Take 1 tablet by  mouth daily at 6 (six) AM.     budesonide-formoterol (SYMBICORT) 160-4.5 MCG/ACT inhaler Inhale 2 puffs into the lungs 2 (two) times daily. 1 each 12   Cholecalciferol (VITAMIN D) 50 MCG (2000 UT) CAPS Take 1 capsule by mouth daily at 6 (six) AM.     dapagliflozin propanediol (FARXIGA) 10 MG TABS tablet Take 1 tablet (10 mg total) by mouth daily before breakfast. 90 tablet 3   FLUoxetine (PROZAC) 20 MG capsule TAKE 1 CAPSULE BY MOUTH DAILY 90 capsule 3   gabapentin (NEURONTIN) 100 MG capsule TAKE 1 CAPSULE BY MOUTH 3 TIMES  DAILY 270 capsule 3   glipiZIDE (GLUCOTROL XL) 2.5 MG 24 hr tablet Take 1 tablet (2.5 mg total) by mouth daily with supper. 30 tablet 3   loratadine (CLARITIN) 10 MG tablet Take 10 mg by mouth daily.     rizatriptan (MAXALT) 10 MG tablet Take 1 tablet (10 mg total) by mouth as needed for migraine. May repeat in 2 hours if needed 12 tablet 3   rosuvastatin (CRESTOR) 10 MG tablet TAKE 1 TABLET BY MOUTH DAILY 90 tablet 1   bacitracin ointment Apply 1 Application topically 2 (two) times daily. (Patient not taking: Reported on 03/11/2023) 120 g 0    Budeson-Glycopyrrol-Formoterol (BREZTRI AEROSPHERE) 160-9-4.8 MCG/ACT AERO Inhale 2 puffs into the lungs in the morning and at bedtime. (Patient not taking: Reported on 03/11/2023) 4.8 g 0   doxycycline (VIBRAMYCIN) 100 MG capsule Take 1 capsule (100 mg total) by mouth 2 (two) times daily. (Patient not taking: Reported on 03/11/2023) 20 capsule 0   traMADol (ULTRAM) 50 MG tablet Take 1 tablet (50 mg total) by mouth every 6 (six) hours as needed. (Patient not taking: Reported on 03/11/2023) 12 tablet 0   No current facility-administered medications for this visit.     Family History    Family History  Problem Relation Age of Onset   Stomach cancer Father    Coronary artery disease Father    Breast cancer Sister 45       breast cancer   Diabetes Maternal Aunt    Diabetes Paternal Aunt    Prostate cancer Brother    Cancer Daughter    Stroke Neg Hx    She indicated that her mother is deceased. She indicated that her father is deceased. She indicated that the status of her sister is unknown. She indicated that the status of her brother is unknown. She indicated that the status of her daughter is unknown. She indicated that the status of her maternal aunt is unknown. She indicated that the status of her paternal aunt is unknown. She indicated that the status of her neg hx is unknown.  Social History    Social History   Socioeconomic History   Marital status: Married    Spouse name: Lars Mage   Number of children: 2   Years of education: College   Highest education level: Bachelor's degree (e.g., BA, AB, BS)  Occupational History   Not on file  Tobacco Use   Smoking status: Never   Smokeless tobacco: Never  Substance and Sexual Activity   Alcohol use: Yes    Alcohol/week: 3.0 standard drinks of alcohol    Types: 3 Glasses of wine per week    Comment: 1/2 glass wine daily   Drug use: No   Sexual activity: Never  Other Topics Concern   Not on file  Social History Narrative   Patient is  married Lars Mage) and lives at home  with her husband.   Patient has two adult children.   Patient is retired.   Patient has a college education.   Patient is right-handed.   Patient drinks one cup of tea daily.   Social Determinants of Health   Financial Resource Strain: Low Risk  (02/22/2023)   Overall Financial Resource Strain (CARDIA)    Difficulty of Paying Living Expenses: Not very hard  Food Insecurity: No Food Insecurity (02/22/2023)   Hunger Vital Sign    Worried About Running Out of Food in the Last Year: Never true    Ran Out of Food in the Last Year: Never true  Transportation Needs: No Transportation Needs (02/22/2023)   PRAPARE - Administrator, Civil Service (Medical): No    Lack of Transportation (Non-Medical): No  Physical Activity: Unknown (02/22/2023)   Exercise Vital Sign    Days of Exercise per Week: 0 days    Minutes of Exercise per Session: Not on file  Stress: Stress Concern Present (02/22/2023)   [employer] of Occupational Health - Occupational Stress Questionnaire    Feeling of Stress : Rather much  Social Connections: Socially Isolated (02/22/2023)   Social Connection and Isolation Panel [NHANES]    Frequency of Communication with Friends and Family: Once a week    Frequency of Social Gatherings with Friends and Family: Once a week    Attends Religious Services: Never    Database administrator or Organizations: No    Attends Engineer, structural: Not on file    Marital Status: Married  Catering manager Violence: Not At Risk (06/09/2021)   Humiliation, Afraid, Rape, and Kick questionnaire    Fear of Current or Ex-Partner: No    Emotionally Abused: No    Physically Abused: No    Sexually Abused: No     Review of Systems    General:  No chills, fever, night sweats or weight changes.  Cardiovascular:  No chest pain, dyspnea on exertion, edema, orthopnea, palpitations, paroxysmal nocturnal dyspnea. Dermatological: No rash,  lesions/masses Respiratory: No cough, dyspnea, some diminished breathing during asthma attacks. Urologic: No hematuria, dysuria Abdominal:   No nausea, vomiting, diarrhea, bright red blood per rectum, melena, or hematemesis positive for epigastric gas pain on CPAP. Neurologic:  No visual changes, wkns, changes in mental status. All other systems reviewed and are otherwise negative except as noted above.     Physical Exam    VS:  BP 130/62 (BP Location: Left Arm, Patient Position: Sitting, Cuff Size: Large)   Pulse 73   Ht 5' (1.524 m)   Wt 280 lb 9.6 oz (127.3 kg)   SpO2 96%   BMI 54.80 kg/m  , BMI Body mass index is 54.8 kg/m.     GEN: Well nourished, well developed, in no acute distress.  Obese HEENT: normal. Neck: Supple, no JVD, carotid bruits, or masses. Cardiac: RRR, no murmurs, rubs, or gallops. No clubbing, cyanosis, 1+ to 2+ dependent edema.  Radials/DP/PT 2+ and equal bilaterally.  Respiratory:  Respirations regular and unlabored, clear to auscultation bilaterally.  No wheezes noted GI: Soft, nontender, nondistended, BS + x 4. MS: no deformity or atrophy. Skin: warm and dry, no rash. Neuro:  Strength and sensation are intact. Psych: Normal affect.  Accessory Clinical Findings    ECG personally reviewed by me today-normal sinus rhythm, heart rate of 73 bpm- No acute changes  Lab Results  Component Value Date   WBC 7.3 02/23/2023   HGB 15.2 (H)  02/23/2023   HCT 45.9 02/23/2023   MCV 93.4 02/23/2023   PLT 192.0 02/23/2023   Lab Results  Component Value Date   CREATININE 0.88 02/23/2023   BUN 15 02/23/2023   NA 139 02/23/2023   K 4.2 02/23/2023   CL 101 02/23/2023   CO2 29 02/23/2023   Lab Results  Component Value Date   ALT 20 02/23/2023   AST 17 02/23/2023   ALKPHOS 58 02/23/2023   BILITOT 0.5 02/23/2023   Lab Results  Component Value Date   CHOL 182 09/08/2022   HDL 55.70 09/08/2022   LDLCALC 89 09/08/2022   LDLDIRECT 92.0 09/05/2020   TRIG  189.0 (H) 09/08/2022   CHOLHDL 3 09/08/2022    Lab Results  Component Value Date   HGBA1C 6.4 (A) 11/26/2022    Review of Prior Studies: Echocardiogram 04/24/2021  1. Left ventricular ejection fraction, by estimation, is 70 to 75%. The  left ventricle has hyperdynamic function. The left ventricle has no  regional wall motion abnormalities. Left ventricular diastolic parameters  are consistent with Grade I diastolic  dysfunction (impaired relaxation). Elevated left atrial pressure.   2. Right ventricular systolic function is normal. The right ventricular  size is normal.   3. The mitral valve is normal in structure. Trivial mitral valve  regurgitation. No evidence of mitral stenosis.   4. The aortic valve has an indeterminant number of cusps. Aortic valve  regurgitation is not visualized. No aortic stenosis is present.   FINDINGS   Left Ventricle: Left ventricular ejection fraction, by estimation, is 70  to 75%. The left ventricle has hyperdynamic function. The left ventricle  has no regional wall motion abnormalities. The left ventricular internal  cavity size was normal in size.  There is no left ventricular hypertrophy. Left ventricular diastolic  parameters are consistent with Grade I diastolic dysfunction (impaired  relaxation). Elevated left atrial pressure.   Right Ventricle: The right ventricular size is normal. Right ventricular  systolic function is normal.   Left Atrium: Left atrial size was normal in size.   Right Atrium: Right atrial size was normal in size.   Pericardium: There is no evidence of pericardial effusion.   Mitral Valve: The mitral valve is normal in structure. Trivial mitral  valve regurgitation. No evidence of mitral valve stenosis.   Tricuspid Valve: The tricuspid valve is normal in structure. Tricuspid  valve regurgitation is trivial. No evidence of tricuspid stenosis.   Aortic Valve: The aortic valve has an indeterminant number of cusps.   Aortic valve regurgitation is not visualized. No aortic stenosis is  present.   Pulmonic Valve: The pulmonic valve was normal in structure. Pulmonic valve  regurgitation is not visualized. No evidence of pulmonic stenosis.   Aorta: The aortic root is normal in size and structure.   Venous: The inferior vena cava was not well visualized.    ZIO monitor 03/2021 Patch Wear Time:  5 days and 23 hours starting Mar 26, 2021. Indications: Palpitations   Patient had a min HR of 53 bpm, max HR of 152 bpm, and avg HR of 69 bpm.   Predominant underlying rhythm was Sinus Rhythm.   2 Supraventricular Tachycardia runs occurred, the run with the fastest interval lasting 5 beats with a max rate of 152 bpm (avg 127 bpm); the run with the fastest interval was also the longest.   Premature atrial complexes were rare. Premature ventricular complexes were rare.   Symptoms associated with sinus rhythm.   No ventricular  tachycardia, no pauses, no atrial fibrillation noted.   Conclusion: Rare asymptomatic paroxysmal supraventricular tachycardia.  Assessment & Plan   1.  Pre-Operative Cardiac Evaluation:   Chart reviewed as part of pre-operative protocol coverage. Given past medical history and time since last visit, based on ACC/AHA guidelines, Nicole Mccann would be at acceptable risk for the planned procedure without further cardiovascular testing.  Difficult to assess functional status due to obesity, chronic back pain significantly affecting ambulation.  Would recommend pulmonary evaluation in the setting of chronic asthma at the discretion of her surgeon.  No medications should be held.   Please note that she reports and anaphylactic reaction in recovery after prior surgery, from unknown medication and should be discussed more thoroughly with anesthesia.  I have reviewed her records and do not find documentation for clarification.  2.  Hyperlipidemia: She remains on rosuvastatin 10 mg daily.   Goal of LDL less than 100.  Labs are followed by PCP.  3.  Asthma: Followed by PCP.  Uses inhalers and nebulizer treatments.  Would recommend pulmonary evaluation at the discretion of anesthesia.  She is advised about switching out her mouth after using inhalers to prevent thrush.  She verbalizes understanding.  4.  Chronic dependent edema: Likely from sedentary lifestyle, related to chronic back pain and obesity.  She has tolerated taking extra doses of Lasix to allow for assistance in the edema.  Monitoring kidney function will be essential if she continues to have to take extra doses.     I will route this recommendation to the requesting party via Epic fax function and remove from pre-op pool.  Please call with questions.  Bettey Mare. Liborio Nixon, ANP, AACC  03/11/2023, 4:46 PM            Signed, Bettey Mare. Liborio Nixon, ANP, AACC   03/11/2023 4:44 PM      Office [phone] Fax [phone]  Notice: This dictation was prepared with Dragon dictation along with smaller phrase technology. Any transcriptional errors that result from this process are unintentional and may not be corrected upon review.

## 2023-03-08 NOTE — Telephone Encounter (Signed)
   Name: Nicole Mccann  DOB: 1946-04-21  MRN: [id number]  Primary Cardiologist: None  Chart reviewed as part of pre-operative protocol coverage. Because of Nicole Mccann's past medical history and time since last visit, she will require a follow-up in-office visit in order to better assess preoperative cardiovascular risk.  Pre-op covering staff: - Please schedule appointment and call patient to inform them. If patient already had an upcoming appointment within acceptable timeframe, please add "pre-op clearance" to the appointment notes so provider is aware. - Please contact requesting surgeon's office via preferred method (i.e, phone, fax) to inform them of need for appointment prior to surgery.  No medications requested for holding.  Napoleon Form, Leodis Rains, NP  03/08/2023, 8:23 AM

## 2023-03-08 NOTE — Telephone Encounter (Signed)
Spoke with the patients daughter and informed her that clearance can be discussed during the follow up on 03/11/23. Patients daughter agreed and voiced understanding.

## 2023-03-11 ENCOUNTER — Encounter: Payer: Self-pay | Admitting: Adult Health

## 2023-03-11 ENCOUNTER — Ambulatory Visit: Payer: Medicare Other | Attending: Adult Health | Admitting: Adult Health

## 2023-03-11 VITALS — BP 130/62 | HR 73 | Ht 60.0 in | Wt 280.6 lb

## 2023-03-11 DIAGNOSIS — E7800 Pure hypercholesterolemia, unspecified: Secondary | ICD-10-CM

## 2023-03-11 DIAGNOSIS — Z01818 Encounter for other preprocedural examination: Secondary | ICD-10-CM | POA: Diagnosis not present

## 2023-03-11 DIAGNOSIS — I471 Supraventricular tachycardia, unspecified: Secondary | ICD-10-CM

## 2023-03-11 DIAGNOSIS — R609 Edema, unspecified: Secondary | ICD-10-CM

## 2023-03-11 DIAGNOSIS — J4520 Mild intermittent asthma, uncomplicated: Secondary | ICD-10-CM

## 2023-03-11 NOTE — Patient Instructions (Signed)
Medication Instructions:  No Changes *If you need a refill on your cardiac medications before your next appointment, please call your pharmacy*   Lab Work: No Labs If you have labs (blood work) drawn today and your tests are completely normal, you will receive your results only by: MyChart Message (if you have MyChart) OR A paper copy in the mail If you have any lab test that is abnormal or we need to change your treatment, we will call you to review the results.   Testing/Procedures: No Testing   Follow-Up: At [facility], you and your health needs are our priority.  As part of our continuing mission to provide you with exceptional heart care, we have created designated Provider Care Teams.  These Care Teams include your primary Cardiologist (physician) and Advanced Practice Providers (APPs -  Physician Assistants and Nurse Practitioners) who all work together to provide you with the care you need, when you need it.  We recommend signing up for the patient portal called "MyChart".  Sign up information is provided on this After Visit Summary.  MyChart is used to connect with patients for Virtual Visits (Telemedicine).  Patients are able to view lab/test results, encounter notes, upcoming appointments, etc.  Non-urgent messages can be sent to your provider as well.   To learn more about what you can do with MyChart, go to [email or web address].    Your next appointment:   6 month(s)  Provider:   Joni Reining, DNP, ANP    or, Thomasene Ripple, DO

## 2023-03-20 NOTE — Progress Notes (Unsigned)
[facility] [address], [address] [place], [place] [address] [phone]  Date:  03/21/2023   Name:  Nicole Mccann   DOB:  [birth year]   MRN:  [id number]  PCP:  Pearline Cables, MD    Chief Complaint: No chief complaint on file.   History of Present Illness:  Nicole Mccann is a 77 y.o. very pleasant female patient who presents with the following:  Patient seen today for periodic follow-up Most recent visit with myself was about 1 month ago when she came in with concern of periumbilical pain and cellulitis History of controlled diabetes with polyneuropathy, obesity, hyperlipidemia, seizure disorder, sleep apnea on CPAP, venous insufficiency with swelling of bilateral lower extremities, asthma  She had a CT scan which showed an umbilical hernia, fat tissue incarcerated Dr Corliss Skains consulted and felt that she does need a hernia repair given her symptoms and risk of complications  Cardiology saw her for preop on May 3-cleared for surgery without further testing Need to update A1c prior to surgery  Lab Results  Component Value Date   HGBA1C 6.4 (A) 11/26/2022     Patient Active Problem List   Diagnosis Date Noted   Open wound of skin 12/22/2022   Class 3 obesity with alveolar hypoventilation, serious comorbidity, and body mass index (BMI) of 50.0 to 59.9 in adult (HCC) 08/10/2022   Gait disorder 08/10/2022   SOB (shortness of breath) 05/12/2021   Diastolic dysfunction 05/12/2021   Chronic diastolic heart failure (HCC) 05/12/2021   Morbid obesity (HCC) 05/12/2021   PSVT (paroxysmal supraventricular tachycardia) 05/12/2021   Hypertriglyceridemia 05/12/2021   Diabetes mellitus due to underlying condition with hyperosmolarity without coma, without long-term current use of insulin (HCC) 05/12/2021   Olfactory aura    Low blood pressure    Hyperlipidemia    Depression    Anxiety    Allergy    Bilateral headaches  12/09/2020   Mixed hyperlipidemia 07/10/2019   Dizziness and giddiness 12/22/2016   Osteopenia 09/21/2016   Low back pain 05/26/2016   Right shoulder pain 04/04/2015   Adjustment disorder with mixed anxiety and depressed mood 03/14/2015   Rotator cuff (capsule) sprain 03/13/2015   Pseudoptosis 11/18/2014   Seizure disorder, temporal lobe, intractable (HCC) 10/17/2014   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/2014   Severe obesity (BMI >= 40) (HCC) 10/17/2014   OSA on CPAP 10/17/2014   Hypersomnia with sleep apnea, unspecified 05/22/2014   Injury of left knee 08/22/2013   Shingles 07/2013   Variants of migraine, not elsewhere classified, without mention of intractable migraine without mention of status migrainosus 05/22/2013   Disturbances of sensation of smell and taste 05/22/2013   Spinal stenosis of lumbar region 12/17/2012   Pulmonary embolism (HCC) 07/09/2012   DVT (deep venous thrombosis) (HCC) 07/07/2012   Atypical seizure (HCC) 06/07/2012   Phlebitis following infusion 06/05/2012   BENIGN POSITIONAL VERTIGO 11/06/2009   Type 2 diabetes, controlled, with peripheral neuropathy (HCC) 04/04/2009   Allergic rhinitis 04/04/2009   ASTHMA 04/04/2009   Diverticulosis of colon 04/04/2009    Past Medical History:  Diagnosis Date   Adjustment disorder with mixed anxiety and depressed mood 03/14/2015   Dr Madaline Guthrie actively treating [place]    ALLERGIC RHINITIS 04/04/2009   Qualifier: Diagnosis of  By: Alwyn Ren MD, Chrissie Noa     Allergy    Anxiety    Dr Madaline Guthrie   Asthma    ASTHMA 04/04/2009   Qualifier:  Diagnosis of  By: Alwyn Ren MD, [name]   Status Asthmaticus 10/2004     Atypical seizure (HCC) 06/07/2012   BENIGN POSITIONAL VERTIGO 11/06/2009   Qualifier: Diagnosis of  By: Linford Arnold MD, Catherine     Depression    Dr Madaline Guthrie   Diabetes mellitus    Disturbances of sensation of smell and taste 05/22/2013   Diverticulosis of colon 04/04/2009   Qualifier: Diagnosis of  By: Alwyn Ren MD, Chrissie Noa      Dizziness and giddiness 12/22/2016   DVT (deep venous thrombosis) (HCC)    Headache(784.0)    Hyperlipidemia    Hypersomnia with sleep apnea, unspecified 05/22/2014   Injury of left knee 08/22/2013   08[place]15[place]13 [place] contusion and abrasion of left knee. 08/17/13 seen in the emergency room; imaging negative. No labs. Referred to Dr. Pearletha Forge who recommended nonsteroidals and elevation. 10/18-11/3/14 in [place] for land and river cruise trip.  08/29/13 seen in ER in [place]; topical antibiotics & oral Ciprobay Rxed. 10/23 at second ER I&D declined ; wound   Low back pain 05/26/2016   Low blood pressure    Mixed hyperlipidemia 07/10/2019   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/2014   Olfactory aura    OSA on CPAP    Osteopenia 09/21/2016   dexa 09/2016   Phlebitis following infusion 06/05/2012   Onset 06/01/12;S/P Dilantin infusion 05/30/12 @ [facility] for atypical seizure activity with response 06/05/12 DVT of Cephalic vein elbow- mid upper arm. Xarelto 15 mg initiated 06/05/12 by Dr Jeraldine Loots , [facility] and increased to one pill twice a day. Despite this she had persistent thrombosis in the cephalic vein in the elbow and the upper forearm areas prompting change to Lovenox 110 mg e   Pulmonary embolism (HCC) 07/09/2012   Presumed based on intermediate VQ 07/07/2012 and high clinical probability.  Definitive CT angiogram was not possible due to contrast  intolerance/allergy    Right shoulder pain 04/04/2015   Rotator cuff (capsule) sprain 03/13/2015   Seizure disorder, temporal lobe, intractable (HCC) 10/17/2014   Severe obesity (BMI >= 40) (HCC) 10/17/2014   Shingles 07/2013   Spinal stenosis of lumbar region 12/17/2012   S/P suboptimal response to Chiropractry ;Dr Kai Levins referred her  to Dr [facility] [phone]; FAX [phone] MRI 12/15/12 : stenosis @ L3-4; L 4-5  in context of DDD ESI recommended . 12/17/12 Note: I recommended discussion  with [name],Neurologist; & Dr Margaretmary Bayley, Diabetologist    Type 2 diabetes, controlled, with peripheral neuropathy (HCC) 04/04/2009   Qualifier: Diagnosis of  By: Alwyn Ren MD, Chrissie Noa   Dr Margaretmary Bayley, Endo Dr Emily Filbert , Ophth seen anually     Variants of migraine, not elsewhere classified, without mention of intractable migraine without mention of status migrainosus 05/22/2013    Past Surgical History:  Procedure Laterality Date   APPENDECTOMY     ARTERY BIOPSY Left 04/14/2018   Procedure: BIOPSY TEMPORAL ARTERY;  Surgeon: Sherren Kerns, MD;  Location: [facility] OR;  Service: Vascular;  Laterality: Left;   BREAST EXCISIONAL BIOPSY Right 1998   CHOLECYSTECTOMY     COLONOSCOPY  2006   [facility]   rotator cuff surgery     TONSILLECTOMY       Social History   Tobacco Use   Smoking status: Never   Smokeless tobacco: Never  Substance Use Topics   Alcohol use: Yes    Alcohol/week: 3.0 standard drinks of alcohol  Types: 3 Glasses of wine per week    Comment: 1/2 glass wine daily   Drug use: No    Family History  Problem Relation Age of Onset   Stomach cancer Father    Coronary artery disease Father    Breast cancer Sister 16       breast cancer   Diabetes Maternal Aunt    Diabetes Paternal Aunt    Prostate cancer Brother    Cancer Daughter    Stroke Neg Hx     Allergies  Allergen Reactions   Mushroom Ext Cmplx(Shiitake-Reishi-Mait) Anaphylaxis   Shellfish Allergy Anaphylaxis   Sulfonamide Derivatives Anaphylaxis   Vancomycin Anaphylaxis   Fluticasone-Salmeterol Other (See Comments)    Feels like something in the throat   Butalbital-Aspirin-Caffeine Other (See Comments)    UNSPECIFIED REACTION    Keflex [Cephalexin]     Nausea/vomitting   Montelukast Sodium Other (See Comments)    Feels like she's running out of breath   Penicillins     UNSPECIFIED REACTION      Childhood allergy Has patient had a PCN reaction causing immediate rash, facial/tongue/throat swelling,  SOB or lightheadedness with hypotension: Unknown Has patient had a PCN reaction causing severe rash involving mucus membranes or skin necrosis: Unknown Has patient had a PCN reaction that required hospitalization: No Has patient had a PCN reaction occurring within the last 10 years: No If all of the above answers are "NO", then may proceed with Cephalosporin use.     Acetaminophen Other (See Comments)    Stomach discomfort   Clarithromycin Nausea And Vomiting   Iodinated Contrast Media Other (See Comments)    Bad headache; no prep needed   Levofloxacin Diarrhea and Nausea And Vomiting    Dizziness Believes it was due to a high dosage of this medication   Orphenadrine Nausea And Vomiting    Medication list has been reviewed and updated.  Current Outpatient Medications on File Prior to Visit  Medication Sig Dispense Refill   albuterol (VENTOLIN HFA) 108 (90 Base) MCG/ACT inhaler Inhale 2 puffs into the lungs every 6 (six) hours as needed for wheezing or shortness of breath. 18 g 6   Bacillus Coagulans-Inulin (PROBIOTIC) 1-250 BILLION-MG CAPS Take 1 tablet by mouth daily at 6 (six) AM.     bacitracin ointment Apply 1 Application topically 2 (two) times daily. (Patient not taking: Reported on 03/11/2023) 120 g 0   Budeson-Glycopyrrol-Formoterol (BREZTRI AEROSPHERE) 160-9-4.8 MCG/ACT AERO Inhale 2 puffs into the lungs in the morning and at bedtime. (Patient not taking: Reported on 03/11/2023) 4.8 g 0   budesonide-formoterol (SYMBICORT) 160-4.5 MCG/ACT inhaler Inhale 2 puffs into the lungs 2 (two) times daily. 1 each 12   Cholecalciferol (VITAMIN D) 50 MCG (2000 UT) CAPS Take 1 capsule by mouth daily at 6 (six) AM.     dapagliflozin propanediol (FARXIGA) 10 MG TABS tablet Take 1 tablet (10 mg total) by mouth daily before breakfast. 90 tablet 3   doxycycline (VIBRAMYCIN) 100 MG capsule Take 1 capsule (100 mg total) by mouth 2 (two) times daily. (Patient not taking: Reported on 03/11/2023) 20 capsule  0   FLUoxetine (PROZAC) 20 MG capsule TAKE 1 CAPSULE BY MOUTH DAILY 90 capsule 3   gabapentin (NEURONTIN) 100 MG capsule TAKE 1 CAPSULE BY MOUTH 3 TIMES  DAILY 270 capsule 3   glipiZIDE (GLUCOTROL XL) 2.5 MG 24 hr tablet Take 1 tablet (2.5 mg total) by mouth daily with supper. 30 tablet 3   loratadine (CLARITIN)  10 MG tablet Take 10 mg by mouth daily.     rizatriptan (MAXALT) 10 MG tablet Take 1 tablet (10 mg total) by mouth as needed for migraine. May repeat in 2 hours if needed 12 tablet 3   rosuvastatin (CRESTOR) 10 MG tablet TAKE 1 TABLET BY MOUTH DAILY 90 tablet 1   traMADol (ULTRAM) 50 MG tablet Take 1 tablet (50 mg total) by mouth every 6 (six) hours as needed. (Patient not taking: Reported on 03/11/2023) 12 tablet 0   No current facility-administered medications on file prior to visit.    Review of Systems:  As per HPI- otherwise negative.   Physical Examination: There were no vitals filed for this visit. There were no vitals filed for this visit. There is no height or weight on file to calculate BMI. Ideal Body Weight:    GEN: no acute distress. HEENT: Atraumatic, Normocephalic.  Ears and Nose: No external deformity. CV: RRR, No M/G/R. No JVD. No thrill. No extra heart sounds. PULM: CTA B, no wheezes, crackles, rhonchi. No retractions. No resp. distress. No accessory muscle use. ABD: S, NT, ND, +BS. No rebound. No HSM. EXTR: No c/c/e PSYCH: Normally interactive. Conversant.    Assessment and Plan: ***  Signed Abbe Amsterdam, MD

## 2023-03-21 ENCOUNTER — Ambulatory Visit: Payer: Medicare Other | Admitting: Family Medicine

## 2023-03-21 VITALS — BP 124/80 | HR 76 | Temp 97.5°F | Resp 18 | Ht 60.0 in | Wt 277.8 lb

## 2023-03-21 DIAGNOSIS — I5032 Chronic diastolic (congestive) heart failure: Secondary | ICD-10-CM

## 2023-03-21 DIAGNOSIS — E1142 Type 2 diabetes mellitus with diabetic polyneuropathy: Secondary | ICD-10-CM

## 2023-03-21 DIAGNOSIS — J4520 Mild intermittent asthma, uncomplicated: Secondary | ICD-10-CM

## 2023-03-21 DIAGNOSIS — L03316 Cellulitis of umbilicus: Secondary | ICD-10-CM | POA: Diagnosis not present

## 2023-03-21 DIAGNOSIS — E782 Mixed hyperlipidemia: Secondary | ICD-10-CM

## 2023-03-21 DIAGNOSIS — R252 Cramp and spasm: Secondary | ICD-10-CM

## 2023-03-21 NOTE — Patient Instructions (Addendum)
It was good to see you today- let me know if you need any assistance prior to your upcoming surgery

## 2023-03-29 ENCOUNTER — Encounter: Payer: Self-pay | Admitting: Neurology

## 2023-03-31 NOTE — Progress Notes (Signed)
Surgical Instructions    Your procedure is scheduled on Tuesday Apr 05, 2023.  Report to [facility] Main Entrance "A" at 7:00 A.M., then check in with the Admitting office.  Call this number if you have problems the morning of surgery:  [phone]   If you have any questions prior to your surgery date call [phone]: Open Monday-Friday 8am-4pm If you experience any cold or flu symptoms such as cough, fever, chills, shortness of breath, etc. between now and your scheduled surgery, please notify us at the above number     Remember:  Do not eat after midnight the night before your surgery  You may drink clear liquids until 6:00 the morning of your surgery.   Clear liquids allowed are: Water, Non-Citrus Juices (without pulp), Carbonated Beverages, Clear Tea, Black Coffee ONLY (NO MILK, CREAM OR POWDERED CREAMER of any kind), and Gatorade.    Take these medicines the morning of surgery with A SIP OF WATER:  budesonide-formoterol (SYMBICORT) gabapentin (NEURONTIN)  loratadine (CLARITIN)  rosuvastatin (CRESTOR)   If Needed:  albuterol (VENTOLIN HFA)- Please bring all inhalers with you the day of surgery.  carboxymethylcellulose (REFRESH TEARS)  rizatriptan (MAXALT)  sodium chloride (OCEAN) nasal spray   As of today, STOP taking any Aspirin (unless otherwise instructed by your surgeon) Aleve, Naproxen, Ibuprofen, Motrin, Advil, Goody's, BC's, all herbal medications, fish oil,all vitamins, and diclofenac Sodium (VOLTAREN ARTHRITIS PAIN ).   Stop taking dapagliflozin propanediol (FARXIGA) 3 days prior to surgery. Last dose on 03/31/2022  WHAT DO I DO ABOUT MY DIABETES MEDICATION?   Do not take glipiZIDE (GLUCOTROL XL) the morning of surgery.   The day before surgery, do not take glipiZIDE (GLUCOTROL XL) dinner dose.   The day of surgery, do not take other diabetes injectables, including Byetta (exenatide), Bydureon (exenatide ER), Victoza (liraglutide), or Trulicity  (dulaglutide).  If your CBG is greater than 220 mg/dL, you may take  of your sliding scale (correction) dose of insulin.    HOW TO MANAGE YOUR DIABETES BEFORE AND AFTER SURGERY  Why is it important to control my blood sugar before and after surgery? Improving blood sugar levels before and after surgery helps healing and can limit problems. A way of improving blood sugar control is eating a healthy diet by:  Eating less sugar and carbohydrates  Increasing activity/exercise  Talking with your doctor about reaching your blood sugar goals High blood sugars (greater than 180 mg/dL) can raise your risk of infections and slow your recovery, so you will need to focus on controlling your diabetes during the weeks before surgery. Make sure that the doctor who takes care of your diabetes knows about your planned surgery including the date and location.  How do I manage my blood sugar before surgery? Check your blood sugar at least 4 times a day, starting 2 days before surgery, to make sure that the level is not too high or low.  Check your blood sugar the morning of your surgery when you wake up and every 2 hours until you get to the Short Stay unit.  If your blood sugar is less than 70 mg/dL, you will need to treat for low blood sugar: Do not take insulin. Treat a low blood sugar (less than 70 mg/dL) with  cup of clear juice (cranberry or apple), 4 glucose tablets, OR glucose gel. Recheck blood sugar in 15 minutes after treatment (to make sure it is greater than 70 mg/dL). If your blood sugar is not greater than  70 mg/dL on recheck, call [phone] for further instructions. Report your blood sugar to the short stay nurse when you get to Short Stay.  If you are admitted to the hospital after surgery: Your blood sugar will be checked by the staff and you will probably be given insulin after surgery (instead of oral diabetes medicines) to make sure you have good blood sugar levels. The goal for  blood sugar control after surgery is 80-180 mg/dL.   Special instructions:    Oral Hygiene is also important to reduce your risk of infection.  Remember - BRUSH YOUR TEETH THE MORNING OF SURGERY WITH YOUR REGULAR TOOTHPASTE   [facility]- Preparing For Surgery  Before surgery, you can play an important role. Because skin is not sterile, your skin needs to be as free of germs as possible. You can reduce the number of germs on your skin by washing with CHG (chlorahexidine gluconate) Soap before surgery.  CHG is an antiseptic cleaner which kills germs and bonds with the skin to continue killing germs even after washing.     Please do not use if you have an allergy to CHG or antibacterial soaps. If your skin becomes reddened/irritated stop using the CHG.  Do not shave (including legs and underarms) for at least 48 hours prior to first CHG shower. It is OK to shave your face.  Please follow these instructions carefully.     Shower the NIGHT BEFORE SURGERY and the MORNING OF SURGERY with CHG Soap.   If you chose to wash your hair, wash your hair first as usual with your normal shampoo. After you shampoo, rinse your hair and body thoroughly to remove the shampoo.  Then [employer] and genitals (private parts) with your normal soap and rinse thoroughly to remove soap.  After that Use CHG Soap as you would any other liquid soap. You can apply CHG directly to the skin and wash gently with a scrungie or a clean washcloth.   Apply the CHG Soap to your body ONLY FROM THE NECK DOWN.  Do not use on open wounds or open sores. Avoid contact with your eyes, ears, mouth and genitals (private parts). Wash Face and genitals (private parts)  with your normal soap.   Wash thoroughly, paying special attention to the area where your surgery will be performed.  Thoroughly rinse your body with warm water from the neck down.  DO NOT shower/wash with your normal soap after using and rinsing off the CHG Soap.  Pat  yourself dry with a CLEAN TOWEL.  Wear CLEAN PAJAMAS to bed the night before surgery  Place CLEAN SHEETS on your bed the night before your surgery  DO NOT SLEEP WITH PETS.   Day of Surgery:  Take a shower with CHG soap. Wear Clean/Comfortable clothing the morning of surgery Do not apply any deodorants/lotions.   Remember to brush your teeth WITH YOUR REGULAR TOOTHPASTE.  Do not wear jewelry or makeup. Do not wear lotions, powders, perfumes/cologne or deodorant. Do not shave 48 hours prior to surgery.  Men may shave face and neck. Do not bring valuables to the hospital. Do not wear nail polish, gel polish, artificial nails, or any other type of covering on natural nails (fingers and toes) If you have artificial nails or gel coating that need to be removed by a nail salon, please have this removed prior to surgery. Artificial nails or gel coating may interfere with anesthesia's ability to adequately monitor your vital signs.  [facility] is not responsible for any belongings or valuables.    Do NOT Smoke (Tobacco/Vaping)  24 hours prior to your procedure  If you use a CPAP at night, you may bring your mask for your overnight stay.   Contacts, glasses, hearing aids, dentures or partials may not be worn into surgery, please bring cases for these belongings   For patients admitted to the hospital, discharge time will be determined by your treatment team.   Patients discharged the day of surgery will not be allowed to drive home, and someone needs to stay with them for 24 hours.   SURGICAL WAITING ROOM VISITATION Patients having surgery or a procedure may have no more than 2 support people in the waiting area - these visitors may rotate.   Children under the age of 67 must have an adult with them who is not the patient. If the patient needs to stay at the hospital during part of their recovery, the visitor guidelines for inpatient rooms apply. Pre-op nurse will coordinate an  appropriate time for 1 support person to accompany patient in pre-op.  This support person may not rotate.   Please refer to [email or web address] for the visitor guidelines for Inpatients (after your surgery is over and you are in a regular room).   If you received a COVID test during your pre-op visit, it is requested that you wear a mask when out in public, stay away from anyone that may not be feeling well, and notify your surgeon if you develop symptoms. If you have been in contact with anyone that has tested positive in the last 10 days, please notify your surgeon.    Please read over the following fact sheets that you were given.

## 2023-04-01 ENCOUNTER — Other Ambulatory Visit: Payer: Self-pay

## 2023-04-01 ENCOUNTER — Encounter (HOSPITAL_COMMUNITY): Payer: Self-pay

## 2023-04-01 ENCOUNTER — Encounter (HOSPITAL_COMMUNITY)
Admission: RE | Admit: 2023-04-01 | Discharge: 2023-04-01 | Disposition: A | Discharge status: Home / Self Care | Payer: Medicare Other | Source: Ambulatory Visit | Attending: Surgery | Admitting: Surgery

## 2023-04-01 VITALS — BP 144/64 | HR 68 | Temp 97.8°F | Resp 18 | Ht 60.0 in | Wt 266.2 lb

## 2023-04-01 DIAGNOSIS — F419 Anxiety disorder, unspecified: Secondary | ICD-10-CM | POA: Insufficient documentation

## 2023-04-01 DIAGNOSIS — K429 Umbilical hernia without obstruction or gangrene: Secondary | ICD-10-CM | POA: Insufficient documentation

## 2023-04-01 DIAGNOSIS — Z86718 Personal history of other venous thrombosis and embolism: Secondary | ICD-10-CM | POA: Diagnosis not present

## 2023-04-01 DIAGNOSIS — J45909 Unspecified asthma, uncomplicated: Secondary | ICD-10-CM | POA: Diagnosis not present

## 2023-04-01 DIAGNOSIS — Z01812 Encounter for preprocedural laboratory examination: Secondary | ICD-10-CM | POA: Diagnosis present

## 2023-04-01 DIAGNOSIS — G4733 Obstructive sleep apnea (adult) (pediatric): Secondary | ICD-10-CM | POA: Diagnosis not present

## 2023-04-01 DIAGNOSIS — E0800 Diabetes mellitus due to underlying condition with hyperosmolarity without nonketotic hyperglycemic-hyperosmolar coma (NKHHC): Secondary | ICD-10-CM | POA: Diagnosis not present

## 2023-04-01 DIAGNOSIS — E782 Mixed hyperlipidemia: Secondary | ICD-10-CM | POA: Insufficient documentation

## 2023-04-01 DIAGNOSIS — Z7984 Long term (current) use of oral hypoglycemic drugs: Secondary | ICD-10-CM | POA: Diagnosis not present

## 2023-04-01 DIAGNOSIS — Z6841 Body Mass Index (BMI) 40.0 and over, adult: Secondary | ICD-10-CM | POA: Insufficient documentation

## 2023-04-01 DIAGNOSIS — Z01818 Encounter for other preprocedural examination: Secondary | ICD-10-CM

## 2023-04-01 HISTORY — DX: Other complications of anesthesia, initial encounter: T88.59XA

## 2023-04-01 HISTORY — DX: Dyspnea, unspecified: R06.00

## 2023-04-01 HISTORY — DX: Unspecified osteoarthritis, unspecified site: M19.90

## 2023-04-01 LAB — GLUCOSE, CAPILLARY: Glucose-Capillary: 135 mg/dL — ABNORMAL HIGH (ref 70–99)

## 2023-04-01 LAB — CBC
HCT: 47.4 % — ABNORMAL HIGH (ref 36.0–46.0)
Hemoglobin: 15.2 g/dL — ABNORMAL HIGH (ref 12.0–15.0)
MCH: 30.7 pg (ref 26.0–34.0)
MCHC: 32.1 g/dL (ref 30.0–36.0)
MCV: 95.8 fL (ref 80.0–100.0)
Platelets: 194 10*3/uL (ref 150–400)
RBC: 4.95 MIL/uL (ref 3.87–5.11)
RDW: 13 % (ref 11.5–15.5)
WBC: 7.8 10*3/uL (ref 4.0–10.5)
nRBC: 0 % (ref 0.0–0.2)

## 2023-04-01 LAB — BASIC METABOLIC PANEL
Anion gap: 9 (ref 5–15)
BUN: 13 mg/dL (ref 8–23)
CO2: 27 mmol/L (ref 22–32)
Calcium: 8.9 mg/dL (ref 8.9–10.3)
Chloride: 100 mmol/L (ref 98–111)
Creatinine, Ser: 0.89 mg/dL (ref 0.44–1.00)
GFR, Estimated: >60 mL/min (ref >60)
Glucose, Bld: 128 mg/dL — ABNORMAL HIGH (ref 70–99)
Potassium: 4.4 mmol/L (ref 3.5–5.1)
Sodium: 136 mmol/L (ref 135–145)

## 2023-04-01 NOTE — Progress Notes (Signed)
Anesthesia Chart Review:  Case: [id number] Date/Time: 04/05/23 0845   Procedure: HERNIA REPAIR UMBILICAL ADULT WITH MESH   Anesthesia type: General   Pre-op diagnosis: UMBILICAL HERNIA   Location: [facility] ROOM 02 / [facility]   Surgeons: Manus Rudd, MD       DISCUSSION: It is a 77 year old female scheduled for the above procedure.  History includes never smoker, HLD, DM2, asthma, anxiety, OSA (uses CPAP), migraine, atypical seizure (05/2012), DVT (left cephalic DVT 06/05/12), PE (based on intermediate risk VQ scan with recent LUE DVT 07/07/12), severe obesity, dyspnea. Anaphylactic type reaction believed secondary to Vancomycin on 04/14/18 after temporal artery biopsy (see below).   She has multiple allergies includes anaphylaxis with mushrooms, shellfish, sulfonamides, vancomycin. She has intolerances to cephalexin, montelukast sodium, levofloxacin, fluconazole-salmeterol, acetaminophen, clarithromycin, orphenadrine, butalbital-aspirin-caffeine.  She has headaches with iodinated contrast media.  She had a post-operative medication reaction on 04/14/18. S/p left temporal artery biopsy 04/14/18 which was negative for temporal arteritis. She received Versed, Fentanyl, vancomycin, Precedex. She had anaphylactic type reaction while in PACU. In review of notes, Vancomycin thought to be source. She developed sudden red rash on her face and back. She became very itchy. IV vancomycin discontinued and given Benadryl. She then developed SOB and felt like her throat was closing. She was placed on 6L O2 with sats 99%. Anesthesiologist and Rapid Response RN called. She was given Solumedrol and Pepcid with improvement, followed by a nebulizer treatment. Supplement O2 weaned and discharged home. Neurology follow-up notes indicate patient upset that she was given Vancomycin as she had known prior reaction, In addition, she reported her PIV infiltrated and she had multiple large bruises from IV attempts.   Last PCP visit with  Dr. Patsy Lager was on 03/21/23. Given patient's symptoms, she was in agreement that UHR was indicated. Updated A1c prior to surgery planned--done 04/01/23 but results is still in process.  Last cardiology evaluation was on 03/11/23 with Joni Reining, NP for preoperative evaluation. She wrote, "Pre-Operative Cardiac Evaluation:   Chart reviewed as part of pre-operative protocol coverage. Given past medical history and time since last visit, based on ACC/AHA guidelines, JISELE LERER would be at acceptable risk for the planned procedure without further cardiovascular testing.  Difficult to assess functional status due to obesity, chronic back pain significantly affecting ambulation.  Would recommend pulmonary evaluation in the setting of chronic asthma at the discretion of her surgeon.  No medications should be held." She noted that patient reported anaphylactic reaction in recovery after last surgery and was advised to discuss with anesthesia team.  See above for details.   Marcelline Deist on hold for 3 days, last dose 04/01/23. 04/01/23 A1c is still in process.  Anesthesia team to evaluate on the day of surgery.    VS: BP (!) 144/64   Pulse 68   Temp 36.6 C   Resp 18   Ht 5' (1.524 m)   Wt 120.7 kg   SpO2 96%   BMI 51.99 kg/m    PROVIDERS: Copland, Gwenlyn Found, MD is PCP  Thomasene Ripple, DO is cardiologist   LABS: Labs reviewed: Acceptable for surgery. A1c is still pending--was 6.4% on 11/26/22. (all labs ordered are listed, but only abnormal results are displayed)  Labs Reviewed  GLUCOSE, CAPILLARY - Abnormal; Notable for the following components:      Result Value   Glucose-Capillary 135 (*)    All other components within normal limits  BASIC METABOLIC PANEL - Abnormal; Notable for the following components:  Glucose, Bld 128 (*)    All other components within normal limits  CBC - Abnormal; Notable for the following components:   Hemoglobin 15.2 (*)    HCT 47.4 (*)    All other  components within normal limits  HEMOGLOBIN A1C    OTHER: Home Sleep Study 09/14/22: IMPRESSION: OSA (obstructive sleep apnea)  RECOMMENDATION:  This home sleep test demonstrates severe obstructive sleep apnea with a total AHI of 37.3/hour and O2 nadir of 86%. The Snore and position sensor were not showing signal. Ongoing treatment with positive airway pressure is highly recommended. The patient has been compliant with CPAP of 12 cm with EPR of 3. I will prescribe a new machine. A laboratory attended titration study can be considered in the future for optimization of his treatment and better tolerance of therapy.  Alternative treatment options are limited secondary to the severity of the patient's sleep disordered breathing, but may include surgical treatment with Inspire, hypoglossal nerve stimulator, in carefully selected candidates meeting criteria.  Concomitant weight loss is recommended, where clinically appropriate. Please note, that untreated obstructive sleep apnea may carry additional perioperative morbidity. Patients with significant obstructive sleep apnea should receive perioperative PAP therapy and the surgeons and particularly the anesthesiologist should be informed of the diagnosis and the severity of the sleep disordered breathing...  EEG 05/29/12 ([facility]):  INTERPRETATION OF EMU ADMISSION: This EEG only captures normal awake and sleep waveforms. There were no abnormal epileptiform activity noted during patient events. However, the day after a trial of dilantin load patient experienced spells with smaller duration.  CLINICAL CORRELATION: It is possible that patient spells might be epileptic given improvement after a trial of dilantin load, eventhough no abnormal interictal abnormalities were noted. Clinical correlation advised.    IMAGES: CT Abd/pelvis 02/23/23: IMPRESSION: 1. Small to moderate umbilical hernia containing only fat. No bowel involvement. No adjacent inflammatory  changes. 2. Hepatic steatosis. 3. Colonic diverticulosis without diverticulitis. - Aortic Atherosclerosis (ICD10-I70.0).  CXR 12/18/12: FINDINGS: Limited study secondary to patient body habitus and portable frontal technique. Low inspiratory volumes with bibasilar atelectasis. Probable cardiomegaly. Chronic bronchitic changes are similar compared to prior. Atherosclerotic calcifications present in the transverse aorta. No overt pulmonary edema, pneumothorax or pleural effusion. IMPRESSION: Limited portable radiograph. Low inspiratory volumes with bibasilar atelectasis. Mild cardiomegaly and aortic atherosclerosis.   EKG: 03/11/23: NSR   CV: LLE Venous US 02/23/23: IMPRESSION: No evidence of deep venous thrombosis.   Echo 04/24/21: IMPRESSIONS   1. Left ventricular ejection fraction, by estimation, is 70 to 75%. The  left ventricle has hyperdynamic function. The left ventricle has no  regional wall motion abnormalities. Left ventricular diastolic parameters  are consistent with Grade I diastolic  dysfunction (impaired relaxation). Elevated left atrial pressure.   2. Right ventricular systolic function is normal. The right ventricular  size is normal.   3. The mitral valve is normal in structure. Trivial mitral valve  regurgitation. No evidence of mitral stenosis.   4. The aortic valve has an indeterminant number of cusps. Aortic valve  regurgitation is not visualized. No aortic stenosis is present.    Long term 5 day heart monitor 03/26/21-03/31/21:  - Patient had a min HR of 53 bpm, max HR of 152 bpm, and avg HR of 69 bpm.  - Predominant underlying rhythm was Sinus Rhythm.  - 2 Supraventricular Tachycardia runs occurred, the run with the fastest interval lasting 5 beats with a max rate of 152 bpm (avg 127 bpm); the run with the  fastest interval was also the longest. - Premature atrial complexes were rare. - Premature ventricular complexes were rare. - Symptoms associated  with sinus rhythm. - No ventricular tachycardia, no pauses, no atrial fibrillation noted. Conclusion: Rare asymptomatic paroxysmal supraventricular tachycardia. -She declined use of propranolol.   Cardiac cath 06/20/03: IMPRESSION/RECOMMENDATIONS:  1. Angiographically normal coronary arteries.  2. Normal left ventricular size and systolic function.  3. No aortic stenosis or mitral regurgitation.   Past Medical History:  Diagnosis Date   Adjustment disorder with mixed anxiety and depressed mood 03/14/2015   Dr Madaline Guthrie actively treating [place]    ALLERGIC RHINITIS 04/04/2009   Qualifier: Diagnosis of  By: Alwyn Ren MD, Chrissie Noa     Allergy    Anxiety    Dr Madaline Guthrie   Arthritis    Asthma    ASTHMA 04/04/2009   Qualifier: Diagnosis of  By: Alwyn Ren MD, [name]   Status Asthmaticus 10/2004     Atypical seizure (HCC) 06/07/2012   BENIGN POSITIONAL VERTIGO 11/06/2009   Qualifier: Diagnosis of  By: Linford Arnold MD, Catherine     Complication of anesthesia    Depression    Dr Madaline Guthrie   Diabetes mellitus    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 08/17/13 seen in the emergency room; imaging negative. No labs. Referred to Dr. Pearletha Forge who recommended nonsteroidals and elevation. 10/18-11/3/14 in [place] for land and river cruise trip.  08/29/13 seen in ER in [place]; topical antibiotics & oral Ciprobay Rxed. 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 Xarelto 15 mg initiated 06/05/12 by Dr Jeraldine Loots , [facility] and increased to one pill twice a day. Despite this she had persistent thrombosis in the cephalic vein in the elbow and the upper forearm areas prompting change to Lovenox 110 mg e   Pulmonary embolism (HCC) 07/09/2012   Presumed based on intermediate VQ 07/07/2012 and high clinical probability.  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 12/17/12 Note: I recommended discussion with [name],Neurologist; & Dr Margaretmary Bayley, Diabetologist    Type 2 diabetes, controlled, with peripheral neuropathy (HCC) 04/04/2009   Qualifier: Diagnosis of  By: Alwyn Ren MD, Chrissie Noa   Dr Margaretmary Bayley, Endo Dr Emily Filbert , Ophth seen anually     Variants of migraine, not elsewhere classified, without mention of intractable migraine without mention of status migrainosus 05/22/2013    Past Surgical History:  Procedure Laterality Date   APPENDECTOMY     ARTERY BIOPSY Left 04/14/2018   Procedure: BIOPSY TEMPORAL ARTERY;  Surgeon: Sherren Kerns, MD;  Location: [facility] OR;  Service: Vascular;  Laterality: Left;   BREAST EXCISIONAL BIOPSY Right 1998   CHOLECYSTECTOMY     COLONOSCOPY  2006   [facility]   KNEE SURGERY Left     rotator cuff surgery     TONSILLECTOMY       MEDICATIONS:  albuterol (VENTOLIN HFA) 108 (90 Base) MCG/ACT inhaler   bacitracin ointment   budesonide-formoterol (SYMBICORT) 160-4.5 MCG/ACT inhaler   Calcium Carb-Cholecalciferol (CALCIUM 600+D3 PO)   carboxymethylcellulose (REFRESH TEARS) 0.5 % SOLN   cetirizine (ZYRTEC) 10 MG tablet   Cholecalciferol (VITAMIN D) 50 MCG (2000 UT) CAPS   dapagliflozin propanediol (FARXIGA) 10 MG TABS tablet   diclofenac Sodium (VOLTAREN ARTHRITIS PAIN) 1 % GEL   doxycycline (VIBRAMYCIN) 100 MG capsule   FLUoxetine (PROZAC) 20 MG capsule   gabapentin (NEURONTIN) 100 MG capsule   glipiZIDE (GLUCOTROL XL) 2.5 MG 24 hr tablet   ibuprofen (ADVIL) 200 MG tablet   lidocaine (LIDO KING) 4 %   loratadine (CLARITIN) 10 MG tablet   Omega-3 Fatty Acids (FISH OIL) 1200 MG CAPS   Probiotic Product (DIGESTIVE ADVANTAGE PO)   rizatriptan (MAXALT) 10 MG tablet   rosuvastatin (CRESTOR) 10 MG tablet   simethicone (MYLICON) 125 MG chewable tablet   sodium chloride (OCEAN) 0.65 % SOLN nasal spray   Soft Lens Products (SENSITIVE EYES SALINE) SOLN   traMADol (ULTRAM) 50 MG tablet   No current facility-administered medications for this encounter.  She is no longer on doxycycline.   Shonna Chock, PA-C Surgical Short Stay/Anesthesiology [facility] Phone [phone] [facility] Phone [phone] 04/01/2023 3:58 PM

## 2023-04-01 NOTE — Progress Notes (Signed)
PCP - Abbe Amsterdam, MD Cardiologist - Joni Reining, NP  PPM/ICD - Denies  Chest x-ray - 12/18/2022 EKG - 03/11/2023 Stress Test - Patient state during her stress test she had a reaction to the medication and the test was stopped. No results noted. ECHO - 04/24/2021 Cardiac Cath - 06/20/2003  Sleep Study - 09/14/2022 Home test CPAP - Yes, patient is unsure on the settings, but does where CPAP every night.  DM: Type II Fasting Blood Sugar - 120's Checks Blood Sugar ever 2-3 days  Patient instructed to hold Farxiga 3 days prior to surgery. Last dose on 04/01/2023  Blood Thinner Instructions: N/A Aspirin Instructions: N/A  ERAS Protcol - Yes PRE-SURGERY Ensure or G2- No drink  COVID TEST- No   Anesthesia review: Yes, cardiac clearance on 03/11/2023  Patient denies shortness of breath, fever, cough and chest pain at PAT appointment   All instructions explained to the patient, with a verbal understanding of the material. Patient agrees to go over the instructions while at home for a better understanding.The opportunity to ask questions was provided.

## 2023-04-01 NOTE — Anesthesia Preprocedure Evaluation (Signed)
Anesthesia Evaluation  Patient identified by MRN, date of birth, ID band Patient awake    Reviewed: Allergy & Precautions, H&P , NPO status , Patient's Chart, lab work & pertinent test results  History of Anesthesia Complications (+) history of anesthetic complications  Airway Mallampati: III  TM Distance: >3 FB Neck ROM: Full    Dental no notable dental hx. (+) Poor Dentition, Chipped, Missing, Partial Upper, Dental Advisory Given   Pulmonary neg pulmonary ROS, shortness of breath, asthma , sleep apnea and Continuous Positive Airway Pressure Ventilation    Pulmonary exam normal breath sounds clear to auscultation       Cardiovascular Exercise Tolerance: Good hypertension, + DVT  Normal cardiovascular exam Rhythm:Regular Rate:Normal     Neuro/Psych  Headaches, Seizures -,  PSYCHIATRIC DISORDERS Anxiety Depression     Neuromuscular disease negative neurological ROS  negative psych ROS   GI/Hepatic negative GI ROS, Neg liver ROS,,,  Endo/Other  diabetes, Well Controlled  Morbid obesity  Renal/GU negative Renal ROS  negative genitourinary   Musculoskeletal  (+) Arthritis ,    Abdominal   Peds negative pediatric ROS (+)  Hematology negative hematology ROS (+)   Anesthesia Other Findings   Reproductive/Obstetrics negative OB ROS                             Anesthesia Physical Anesthesia Plan  ASA: 4  Anesthesia Plan: General   Post-op Pain Management: Toradol IV (intra-op)* and Ofirmev IV (intra-op)*   Induction: Intravenous  PONV Risk Score and Plan: 3 and Ondansetron, Dexamethasone, TIVA and Treatment may vary due to age or medical condition  Airway Management Planned: Oral ETT and LMA  Additional Equipment: None  Intra-op Plan:   Post-operative Plan: Extubation in OR  Informed Consent: I have reviewed the patients History and Physical, chart, labs and discussed the procedure  including the risks, benefits and alternatives for the proposed anesthesia with the patient or authorized representative who has indicated his/her understanding and acceptance.       Plan Discussed with: Anesthesiologist and CRNA  Anesthesia Plan Comments: (See PAT note written 04/01/2023 by Shonna Chock, PA-C. DISCUSSION: It is a 77 year old female scheduled for the above procedure.   History includes never smoker, HLD, DM2, asthma, anxiety, OSA (uses CPAP), migraine, atypical seizure (05/2012), DVT (left cephalic DVT 06/05/12), PE (based on intermediate risk VQ scan with recent LUE DVT 07/07/12), severe obesity, dyspnea. Anaphylactic type reaction believed secondary to Vancomycin on 04/14/18 after temporal artery biopsy (see below).    She has multiple allergies includes anaphylaxis with mushrooms, shellfish, sulfonamides, vancomycin. She has intolerances to cephalexin, montelukast sodium, levofloxacin, fluconazole-salmeterol, acetaminophen, clarithromycin, orphenadrine, butalbital-aspirin-caffeine.  She has headaches with iodinated contrast media.   She had a post-operative medication reaction on 04/14/18. S/p left temporal artery biopsy 04/14/18 which was negative for temporal arteritis. She received Versed, Fentanyl, vancomycin, Precedex. She had anaphylactic type reaction while in PACU. In review of notes, Vancomycin thought to be source. She developed sudden red rash on her face and back. She became very itchy. IV vancomycin discontinued and given Benadryl. She then developed SOB and felt like her throat was closing. She was placed on 6L O2 with sats 99%. Anesthesiologist and Rapid Response RN called. She was given Solumedrol and Pepcid with improvement, followed by a nebulizer treatment. Supplement O2 weaned and discharged home. Neurology follow-up notes indicate patient upset that she was given Vancomycin as she had known prior reaction,  In addition, she reported her PIV infiltrated and she had  multiple large bruises from IV attempts.    Last PCP visit with Dr. Patsy Lager was on 03/21/23. Given patient's symptoms, she was in agreement that UHR was indicated. Updated A1c prior to surgery planned--done 04/01/23 but results is still in process.   Last cardiology evaluation was on 03/11/23 with Joni Reining, NP for preoperative evaluation. She wrote, "Pre-Operative Cardiac Evaluation:   Chart reviewed as part of pre-operative protocol coverage. Given past medical history and time since last visit, based on ACC/AHA guidelines, CLIDA OGLETREE would be at acceptable risk for the planned procedure without further cardiovascular testing.  Difficult to assess functional status due to obesity, chronic back pain significantly affecting ambulation.  Would recommend pulmonary evaluation in the setting of chronic asthma at the discretion of her surgeon.  No medications should be held." She noted that patient reported anaphylactic reaction in recovery after last surgery and was advised to discuss with anesthesia team.  See above for details.    Marcelline Deist on hold for 3 days, last dose 04/01/23. 04/01/23 A1c is still in process.   Home Sleep Study 09/14/22: IMPRESSION: OSA (obstructive sleep apnea)  RECOMMENDATION:  1. This home sleep test demonstrates severe obstructive sleep apnea with a total AHI of 37.3/hour and O2 nadir of 86%. The Snore and position sensor were not showing signal. Ongoing treatment with positive airway pressure is highly recommended. The patient has been compliant with CPAP of 12 cm with EPR of 3. I will prescribe a new machine. A laboratory attended titration study can be considered in the future for optimization of his treatment and better tolerance of therapy.  Alternative treatment options are limited secondary to the severity of the patient's sleep disordered breathing, but may include surgical treatment with Inspire, hypoglossal nerve stimulator, in carefully selected candidates meeting  criteria.  Concomitant weight loss is recommended, where clinically appropriate. Please note, that untreated obstructive sleep apnea may carry additional perioperative morbidity. Patients with significant obstructive sleep apnea should receive perioperative PAP therapy and the surgeons and particularly the anesthesiologist should be informed of the diagnosis and the severity of the sleep disordered breathing...   EEG 05/29/12 ([facility]):  INTERPRETATION OF EMU ADMISSION: This EEG only captures normal awake and sleep waveforms. There were no abnormal epileptiform activity noted during patient events. However, the day after a trial of dilantin load patient experienced spells with smaller duration.  CLINICAL CORRELATION: It is possible that patient spells might be epileptic given improvement after a trial of dilantin load, eventhough no abnormal interictal abnormalities were noted. Clinical correlation advised.   Echo 04/24/21: IMPRESSIONS   1. Left ventricular ejection fraction, by estimation, is 70 to 75%. The  left ventricle has hyperdynamic function. The left ventricle has no  regional wall motion abnormalities. Left ventricular diastolic parameters  are consistent with Grade I diastolic  dysfunction (impaired relaxation). Elevated left atrial pressure.   2. Right ventricular systolic function is normal. The right ventricular  size is normal.   3. The mitral valve is normal in structure. Trivial mitral valve  regurgitation. No evidence of mitral stenosis.   4. The aortic valve has an indeterminant number of cusps. Aortic valve  regurgitation is not visualized. No aortic stenosis is present.      Long term 5 day heart monitor 03/26/21-03/31/21:  - Patient had a min HR of 53 bpm, max HR of 152 bpm, and avg HR of 69 bpm.  - Predominant underlying rhythm  was Sinus Rhythm.  - 2 Supraventricular Tachycardia runs occurred, the run with the fastest interval lasting 5 beats with a max rate of 152 bpm  (avg 127 bpm); the run with the fastest interval was also the longest. - Premature atrial complexes were rare. - Premature ventricular complexes were rare. - Symptoms associated with sinus rhythm. - No ventricular tachycardia, no pauses, no atrial fibrillation noted. Conclusion: Rare asymptomatic paroxysmal supraventricular tachycardia. -She declined use of propranolol.     Cardiac cath 06/20/03: IMPRESSION/RECOMMENDATIONS:  1. Angiographically normal coronary arteries.  2. Normal left ventricular size and systolic function.  3. No aortic stenosis or mitral regurgitation.   Anesthesia team to evaluate on the day of surgery.    )       Anesthesia Quick Evaluation

## 2023-04-02 LAB — HEMOGLOBIN A1C
Hgb A1c MFr Bld: 7.2 % — ABNORMAL HIGH (ref 4.8–5.6)
Mean Plasma Glucose: 160 mg/dL

## 2023-04-05 ENCOUNTER — Ambulatory Visit (HOSPITAL_BASED_OUTPATIENT_CLINIC_OR_DEPARTMENT_OTHER): Payer: Medicare Other | Admitting: Anesthesiology

## 2023-04-05 ENCOUNTER — Encounter (HOSPITAL_COMMUNITY)
Admission: RE | Disposition: A | Discharge status: Home / Self Care | Payer: Self-pay | Source: Home / Self Care | Attending: Surgery

## 2023-04-05 ENCOUNTER — Ambulatory Visit (HOSPITAL_COMMUNITY): Payer: Medicare Other | Admitting: Vascular Surgery

## 2023-04-05 ENCOUNTER — Encounter (HOSPITAL_COMMUNITY): Payer: Self-pay | Admitting: Surgery

## 2023-04-05 ENCOUNTER — Other Ambulatory Visit: Payer: Self-pay

## 2023-04-05 ENCOUNTER — Observation Stay (HOSPITAL_COMMUNITY)
Admission: RE | Admit: 2023-04-05 | Discharge: 2023-04-06 | Disposition: A | Discharge status: Home / Self Care | Payer: Medicare Other | Attending: Surgery | Admitting: Surgery

## 2023-04-05 DIAGNOSIS — K429 Umbilical hernia without obstruction or gangrene: Secondary | ICD-10-CM

## 2023-04-05 DIAGNOSIS — I5032 Chronic diastolic (congestive) heart failure: Secondary | ICD-10-CM | POA: Insufficient documentation

## 2023-04-05 DIAGNOSIS — E119 Type 2 diabetes mellitus without complications: Secondary | ICD-10-CM | POA: Diagnosis not present

## 2023-04-05 DIAGNOSIS — J45909 Unspecified asthma, uncomplicated: Secondary | ICD-10-CM | POA: Insufficient documentation

## 2023-04-05 DIAGNOSIS — Z79899 Other long term (current) drug therapy: Secondary | ICD-10-CM | POA: Insufficient documentation

## 2023-04-05 DIAGNOSIS — Z86718 Personal history of other venous thrombosis and embolism: Secondary | ICD-10-CM | POA: Diagnosis not present

## 2023-04-05 DIAGNOSIS — G4733 Obstructive sleep apnea (adult) (pediatric): Secondary | ICD-10-CM

## 2023-04-05 DIAGNOSIS — E0800 Diabetes mellitus due to underlying condition with hyperosmolarity without nonketotic hyperglycemic-hyperosmolar coma (NKHHC): Secondary | ICD-10-CM

## 2023-04-05 DIAGNOSIS — E1149 Type 2 diabetes mellitus with other diabetic neurological complication: Secondary | ICD-10-CM | POA: Diagnosis not present

## 2023-04-05 DIAGNOSIS — Z9989 Dependence on other enabling machines and devices: Secondary | ICD-10-CM

## 2023-04-05 HISTORY — PX: INSERTION OF MESH: SHX5868

## 2023-04-05 HISTORY — PX: UMBILICAL HERNIA REPAIR: SHX196

## 2023-04-05 LAB — GLUCOSE, CAPILLARY
Glucose-Capillary: 128 mg/dL — ABNORMAL HIGH (ref 70–99)
Glucose-Capillary: 144 mg/dL — ABNORMAL HIGH (ref 70–99)
Glucose-Capillary: 145 mg/dL — ABNORMAL HIGH (ref 70–99)
Glucose-Capillary: 225 mg/dL — ABNORMAL HIGH (ref 70–99)
Glucose-Capillary: 279 mg/dL — ABNORMAL HIGH (ref 70–99)
Glucose-Capillary: 307 mg/dL — ABNORMAL HIGH (ref 70–99)
Glucose-Capillary: 318 mg/dL — ABNORMAL HIGH (ref 70–99)

## 2023-04-05 SURGERY — REPAIR, HERNIA, UMBILICAL, ADULT
Anesthesia: General | Site: Abdomen

## 2023-04-05 MED ORDER — OXYCODONE HCL 5 MG PO TABS: 5.0000 mg | ORAL_TABLET | Freq: Once | ORAL | Status: DC | PRN

## 2023-04-05 MED ORDER — LIDOCAINE 2% (20 MG/ML) 5 ML SYRINGE
INTRAMUSCULAR | Status: DC | PRN
  Administered 2023-04-05: 100 mg via INTRAVENOUS

## 2023-04-05 MED ORDER — PROPOFOL 10 MG/ML IV BOLUS
INTRAVENOUS | Status: AC
  Filled 2023-04-05: qty 20

## 2023-04-05 MED ORDER — ACETAMINOPHEN 325 MG PO TABS: 325.0000 mg | ORAL_TABLET | ORAL | Status: DC | PRN

## 2023-04-05 MED ORDER — SUGAMMADEX SODIUM 200 MG/2ML IV SOLN
INTRAVENOUS | Status: DC | PRN
  Administered 2023-04-05: 250 mg via INTRAVENOUS

## 2023-04-05 MED ORDER — DEXAMETHASONE SODIUM PHOSPHATE 10 MG/ML IJ SOLN
INTRAMUSCULAR | Status: AC
  Filled 2023-04-05: qty 1

## 2023-04-05 MED ORDER — GABAPENTIN 100 MG PO CAPS: 100.0000 mg | ORAL_CAPSULE | ORAL | Status: DC

## 2023-04-05 MED ORDER — CHLORHEXIDINE GLUCONATE CLOTH 2 % EX PADS: 6.0000 | MEDICATED_PAD | Freq: Once | CUTANEOUS | Status: DC

## 2023-04-05 MED ORDER — INSULIN ASPART 100 UNIT/ML IJ SOLN
0.0000 [IU] | Freq: Three times a day (TID) | INTRAMUSCULAR | Status: DC
  Administered 2023-04-05: 5 [IU] via SUBCUTANEOUS
  Administered 2023-04-06: 3 [IU] via SUBCUTANEOUS

## 2023-04-05 MED ORDER — ROSUVASTATIN CALCIUM 5 MG PO TABS
10.0000 mg | ORAL_TABLET | Freq: Every day | ORAL | Status: DC
  Administered 2023-04-06: 10 mg via ORAL
  Filled 2023-04-05: qty 2

## 2023-04-05 MED ORDER — ALBUTEROL SULFATE (2.5 MG/3ML) 0.083% IN NEBU: 2.5000 mg | INHALATION_SOLUTION | Freq: Four times a day (QID) | RESPIRATORY_TRACT | Status: DC | PRN

## 2023-04-05 MED ORDER — DIPHENHYDRAMINE HCL 12.5 MG/5ML PO ELIX: 12.5000 mg | ORAL_SOLUTION | Freq: Four times a day (QID) | ORAL | Status: DC | PRN

## 2023-04-05 MED ORDER — ENOXAPARIN SODIUM 40 MG/0.4ML IJ SOSY
40.0000 mg | PREFILLED_SYRINGE | INTRAMUSCULAR | Status: DC
  Filled 2023-04-05: qty 0.4

## 2023-04-05 MED ORDER — IBUPROFEN 400 MG PO TABS: 400.0000 mg | ORAL_TABLET | Freq: Four times a day (QID) | ORAL | Status: DC | PRN

## 2023-04-05 MED ORDER — GABAPENTIN 100 MG PO CAPS
100.0000 mg | ORAL_CAPSULE | Freq: Two times a day (BID) | ORAL | Status: DC
  Administered 2023-04-05 – 2023-04-06 (×2): 100 mg via ORAL
  Filled 2023-04-05 (×2): qty 1

## 2023-04-05 MED ORDER — ALBUTEROL SULFATE HFA 108 (90 BASE) MCG/ACT IN AERS
INHALATION_SPRAY | RESPIRATORY_TRACT | Status: DC | PRN
  Administered 2023-04-05: 2 via RESPIRATORY_TRACT

## 2023-04-05 MED ORDER — MEPERIDINE HCL 25 MG/ML IJ SOLN: 6.2500 mg | INTRAMUSCULAR | Status: DC | PRN

## 2023-04-05 MED ORDER — DAPAGLIFLOZIN PROPANEDIOL 10 MG PO TABS
10.0000 mg | ORAL_TABLET | Freq: Every day | ORAL | Status: DC
  Administered 2023-04-06: 10 mg via ORAL
  Filled 2023-04-05: qty 1

## 2023-04-05 MED ORDER — ROCURONIUM BROMIDE 10 MG/ML (PF) SYRINGE
PREFILLED_SYRINGE | INTRAVENOUS | Status: AC
  Filled 2023-04-05: qty 10

## 2023-04-05 MED ORDER — TRAMADOL HCL 50 MG PO TABS: 50.0000 mg | ORAL_TABLET | Freq: Four times a day (QID) | ORAL | Status: DC | PRN

## 2023-04-05 MED ORDER — 0.9 % SODIUM CHLORIDE (POUR BTL) OPTIME
TOPICAL | Status: DC | PRN
  Administered 2023-04-05: 1000 mL

## 2023-04-05 MED ORDER — ONDANSETRON HCL 4 MG/2ML IJ SOLN: 4.0000 mg | Freq: Once | INTRAMUSCULAR | Status: DC | PRN

## 2023-04-05 MED ORDER — PROPOFOL 500 MG/50ML IV EMUL
INTRAVENOUS | Status: DC | PRN
  Administered 2023-04-05: 125 ug/kg/min via INTRAVENOUS

## 2023-04-05 MED ORDER — INSULIN ASPART 100 UNIT/ML IJ SOLN: 0.0000 [IU] | INTRAMUSCULAR | Status: DC | PRN

## 2023-04-05 MED ORDER — BUPIVACAINE-EPINEPHRINE 0.25% -1:200000 IJ SOLN
INTRAMUSCULAR | Status: DC | PRN
  Administered 2023-04-05: 10 mL

## 2023-04-05 MED ORDER — DIPHENHYDRAMINE HCL 50 MG/ML IJ SOLN: 12.5000 mg | Freq: Four times a day (QID) | INTRAMUSCULAR | Status: DC | PRN

## 2023-04-05 MED ORDER — ONDANSETRON HCL 4 MG/2ML IJ SOLN
INTRAMUSCULAR | Status: DC | PRN
  Administered 2023-04-05: 4 mg via INTRAVENOUS

## 2023-04-05 MED ORDER — OXYCODONE HCL 5 MG PO TABS: 5.0000 mg | ORAL_TABLET | ORAL | Status: DC | PRN

## 2023-04-05 MED ORDER — SODIUM CHLORIDE 0.9 % IV SOLN: INTRAVENOUS | Status: DC

## 2023-04-05 MED ORDER — DIPHENHYDRAMINE HCL 50 MG/ML IJ SOLN
INTRAMUSCULAR | Status: AC
  Filled 2023-04-05: qty 1

## 2023-04-05 MED ORDER — DEXMEDETOMIDINE HCL IN NACL 80 MCG/20ML IV SOLN
INTRAVENOUS | Status: DC | PRN
  Administered 2023-04-05 (×2): 4 ug via INTRAVENOUS

## 2023-04-05 MED ORDER — BUPIVACAINE-EPINEPHRINE (PF) 0.25% -1:200000 IJ SOLN
INTRAMUSCULAR | Status: AC
  Filled 2023-04-05: qty 30

## 2023-04-05 MED ORDER — LORATADINE 10 MG PO TABS
10.0000 mg | ORAL_TABLET | Freq: Every morning | ORAL | Status: DC
  Administered 2023-04-06: 10 mg via ORAL
  Filled 2023-04-05: qty 1

## 2023-04-05 MED ORDER — ACETAMINOPHEN 160 MG/5ML PO SOLN: 325.0000 mg | ORAL | Status: DC | PRN

## 2023-04-05 MED ORDER — FENTANYL CITRATE (PF) 100 MCG/2ML IJ SOLN
INTRAMUSCULAR | Status: DC | PRN
  Administered 2023-04-05 (×2): 50 ug via INTRAVENOUS

## 2023-04-05 MED ORDER — ONDANSETRON HCL 4 MG/2ML IJ SOLN: 4.0000 mg | Freq: Four times a day (QID) | INTRAMUSCULAR | Status: DC | PRN

## 2023-04-05 MED ORDER — LACTATED RINGERS IV SOLN: INTRAVENOUS | Status: DC

## 2023-04-05 MED ORDER — INSULIN ASPART 100 UNIT/ML IV SOLN
5.0000 [IU] | Freq: Once | INTRAVENOUS | Status: AC
  Administered 2023-04-05: 5 [IU] via INTRAVENOUS

## 2023-04-05 MED ORDER — OXYCODONE HCL 5 MG/5ML PO SOLN: 5.0000 mg | Freq: Once | ORAL | Status: DC | PRN

## 2023-04-05 MED ORDER — FLUOXETINE HCL 20 MG PO CAPS
20.0000 mg | ORAL_CAPSULE | Freq: Every evening | ORAL | Status: DC
  Administered 2023-04-05: 20 mg via ORAL
  Filled 2023-04-05: qty 1

## 2023-04-05 MED ORDER — CHLORHEXIDINE GLUCONATE 0.12 % MT SOLN
15.0000 mL | Freq: Once | OROMUCOSAL | Status: AC
  Administered 2023-04-05: 15 mL via OROMUCOSAL
  Filled 2023-04-05: qty 15

## 2023-04-05 MED ORDER — FENTANYL CITRATE (PF) 250 MCG/5ML IJ SOLN
INTRAMUSCULAR | Status: AC
  Filled 2023-04-05: qty 5

## 2023-04-05 MED ORDER — ORAL CARE MOUTH RINSE: 15.0000 mL | Freq: Once | OROMUCOSAL | Status: AC

## 2023-04-05 MED ORDER — LIDOCAINE 2% (20 MG/ML) 5 ML SYRINGE
INTRAMUSCULAR | Status: AC
  Filled 2023-04-05: qty 5

## 2023-04-05 MED ORDER — ONDANSETRON HCL 4 MG/2ML IJ SOLN
INTRAMUSCULAR | Status: AC
  Filled 2023-04-05: qty 2

## 2023-04-05 MED ORDER — ALBUTEROL SULFATE HFA 108 (90 BASE) MCG/ACT IN AERS
INHALATION_SPRAY | RESPIRATORY_TRACT | Status: AC
  Filled 2023-04-05: qty 6.7

## 2023-04-05 MED ORDER — DEXAMETHASONE SODIUM PHOSPHATE 10 MG/ML IJ SOLN
INTRAMUSCULAR | Status: DC | PRN
  Administered 2023-04-05: 10 mg via INTRAVENOUS

## 2023-04-05 MED ORDER — GABAPENTIN 100 MG PO CAPS
200.0000 mg | ORAL_CAPSULE | Freq: Every day | ORAL | Status: DC
  Administered 2023-04-05: 200 mg via ORAL
  Filled 2023-04-05: qty 2

## 2023-04-05 MED ORDER — ONDANSETRON 4 MG PO TBDP: 4.0000 mg | ORAL_TABLET | Freq: Four times a day (QID) | ORAL | Status: DC | PRN

## 2023-04-05 MED ORDER — DIPHENHYDRAMINE HCL 50 MG/ML IJ SOLN
INTRAMUSCULAR | Status: DC | PRN
  Administered 2023-04-05: 6.25 mg via INTRAVENOUS

## 2023-04-05 MED ORDER — PROPOFOL 10 MG/ML IV BOLUS
INTRAVENOUS | Status: DC | PRN
  Administered 2023-04-05: 150 mg via INTRAVENOUS

## 2023-04-05 MED ORDER — FENTANYL CITRATE (PF) 100 MCG/2ML IJ SOLN
25.0000 ug | INTRAMUSCULAR | Status: DC | PRN
  Administered 2023-04-05: 50 ug via INTRAVENOUS

## 2023-04-05 MED ORDER — FENTANYL CITRATE (PF) 100 MCG/2ML IJ SOLN
INTRAMUSCULAR | Status: AC
  Filled 2023-04-05: qty 2

## 2023-04-05 MED ORDER — OXYCODONE HCL 5 MG PO TABS: 5.0000 mg | ORAL_TABLET | Freq: Four times a day (QID) | ORAL | 0 refills | Status: DC | PRN

## 2023-04-05 MED ORDER — SUMATRIPTAN SUCCINATE 50 MG PO TABS: 50.0000 mg | ORAL_TABLET | ORAL | Status: DC | PRN

## 2023-04-05 MED ORDER — MORPHINE SULFATE (PF) 2 MG/ML IV SOLN: 2.0000 mg | INTRAVENOUS | Status: DC | PRN

## 2023-04-05 MED ORDER — FLUTICASONE FUROATE-VILANTEROL 200-25 MCG/ACT IN AEPB
1.0000 | INHALATION_SPRAY | Freq: Every day | RESPIRATORY_TRACT | Status: DC
  Filled 2023-04-05: qty 28

## 2023-04-05 MED ORDER — FLUOXETINE HCL 20 MG PO CAPS
20.0000 mg | ORAL_CAPSULE | Freq: Every evening | ORAL | Status: DC
  Filled 2023-04-05: qty 1

## 2023-04-05 MED ORDER — CLINDAMYCIN PHOSPHATE 900 MG/50ML IV SOLN
900.0000 mg | INTRAVENOUS | Status: AC
  Administered 2023-04-05: 900 mg via INTRAVENOUS
  Filled 2023-04-05: qty 50

## 2023-04-05 MED ORDER — ROCURONIUM BROMIDE 100 MG/10ML IV SOLN
INTRAVENOUS | Status: DC | PRN
  Administered 2023-04-05: 40 mg via INTRAVENOUS

## 2023-04-05 SURGICAL SUPPLY — 39 items
APL PRP STRL LF DISP 70% ISPRP (MISCELLANEOUS) ×1
APL SKNCLS STERI-STRIP NONHPOA (GAUZE/BANDAGES/DRESSINGS) ×1
BAG COUNTER SPONGE SURGICOUNT (BAG) ×1 IMPLANT
BAG SPNG CNTER NS LX DISP (BAG)
BENZOIN TINCTURE PRP APPL 2/3 (GAUZE/BANDAGES/DRESSINGS) ×1 IMPLANT
BLADE CLIPPER SURG (BLADE) IMPLANT
CANISTER SUCT 3000ML PPV (MISCELLANEOUS) IMPLANT
CHLORAPREP W/TINT 26 (MISCELLANEOUS) ×1 IMPLANT
COVER SURGICAL LIGHT HANDLE (MISCELLANEOUS) ×1 IMPLANT
DRAPE LAPAROTOMY 100X72 PEDS (DRAPES) ×1 IMPLANT
DRSG TEGADERM 4X4.75 (GAUZE/BANDAGES/DRESSINGS) ×1 IMPLANT
ELECT CAUTERY BLADE 6.4 (BLADE) IMPLANT
ELECT REM PT RETURN 9FT ADLT (ELECTROSURGICAL) ×1
ELECTRODE REM PT RTRN 9FT ADLT (ELECTROSURGICAL) ×1 IMPLANT
GAUZE 4X4 16PLY ~~LOC~~+RFID DBL (SPONGE) ×1 IMPLANT
GAUZE SPONGE 2X2 8PLY STRL LF (GAUZE/BANDAGES/DRESSINGS) ×1 IMPLANT
GAUZE SPONGE 4X4 12PLY STRL (GAUZE/BANDAGES/DRESSINGS) IMPLANT
GLOVE BIO SURGEON STRL SZ7 (GLOVE) ×1 IMPLANT
GLOVE BIOGEL PI IND STRL 7.5 (GLOVE) ×1 IMPLANT
GOWN STRL REUS W/ TWL LRG LVL3 (GOWN DISPOSABLE) ×2 IMPLANT
GOWN STRL REUS W/TWL LRG LVL3 (GOWN DISPOSABLE) ×2
KIT BASIN OR (CUSTOM PROCEDURE TRAY) ×1 IMPLANT
KIT TURNOVER KIT B (KITS) ×1 IMPLANT
MESH VENTRALEX ST 2.5 CRC MED (Mesh General) IMPLANT
NDL HYPO 25GX1X1/2 BEV (NEEDLE) ×1 IMPLANT
NEEDLE HYPO 25GX1X1/2 BEV (NEEDLE) ×1 IMPLANT
NS IRRIG 1000ML POUR BTL (IV SOLUTION) ×1 IMPLANT
PACK GENERAL/GYN (CUSTOM PROCEDURE TRAY) ×1 IMPLANT
PAD ARMBOARD 7.5X6 YLW CONV (MISCELLANEOUS) ×1 IMPLANT
PENCIL SMOKE EVACUATOR (MISCELLANEOUS) ×1 IMPLANT
STRIP CLOSURE SKIN 1/2X4 (GAUZE/BANDAGES/DRESSINGS) ×1 IMPLANT
SUT MNCRL AB 4-0 PS2 18 (SUTURE) ×1 IMPLANT
SUT NOVA NAB DX-16 0-1 5-0 T12 (SUTURE) ×1 IMPLANT
SUT NOVA NAB GS-21 0 18 T12 DT (SUTURE) ×1 IMPLANT
SUT VIC AB 3-0 SH 27 (SUTURE) ×1
SUT VIC AB 3-0 SH 27X BRD (SUTURE) ×1 IMPLANT
SYR CONTROL 10ML LL (SYRINGE) ×1 IMPLANT
TOWEL GREEN STERILE (TOWEL DISPOSABLE) ×1 IMPLANT
TOWEL GREEN STERILE FF (TOWEL DISPOSABLE) ×1 IMPLANT

## 2023-04-05 NOTE — Op Note (Signed)
Indications:  This is a 77 year old female with DM 2, morbid obesity, sleep apnea requiring CPAP who presents with recent onset of umbilical pain and erythema. She was evaluated by Dr. Patsy Lager who diagnosed her with an umbilical hernia with some overlying cellulitis. She was treated with oral antibiotics and the erythema has resolved. CT scan showed an umbilical hernia containing fat with a fascial defect about 2.6 cm. The patient was examined and we recommended umbilical hernia repair with mesh.  Pre-operative diagnosis:  Umbilical hernia  Post-operative diagnosis:  Same  Procedure:  Umbilical hernia repair with mesh  Surgeon:  [name] Resident:  Dr. Evern Core I was personally present during the key and critical portions of this procedure and immediately available throughout the entire procedure, as documented in my operative note.   Procedure Details  The patient was seen again in the Holding Room. The risks, benefits, complications, treatment options, and expected outcomes were discussed with the patient. The possibilities of reaction to medication, pulmonary aspiration, perforation of viscus, bleeding, recurrent infection, the need for additional procedures, and development of a complication requiring transfusion or further operation were discussed with the patient and/or family. There was concurrence with the proposed plan, and informed consent was obtained. The site of surgery was properly noted/marked. The patient was taken to the Operating Room, identified as [facility], and the procedure verified as umbilical hernia repair. A Time Out was held and the above information confirmed.  After an adequate level of general anesthesia was obtained, the patient's abdomen was prepped with Chloraprep and draped in sterile fashion.  We made a transverse incision above the umbilicus.  Dissection was carried down to the hernia sac with cautery.  We dissected bluntly around the hernia sac  down to the edge of the fascial defect.  We reduced the hernia sac back into the pre-peritoneal space.  The fascial defect measured 3 cm.  We cleared the fascia in all directions.  A medium mesh was inserted into the pre-peritoneal space and was deployed.  The mesh was secured with four trans-fascial sutures of 1 Novofil.  The fascial defect was closed with multiple interrupted figure-of-eight 1 Novofil sutures.  The base of the umbilicus was tacked down with 3-0 Vicryl.  3-0 Vicryl was used to close the subcutaneous tissues and 4-0 Monocryl was used to close the skin.  Steri-strips and clean dressing were applied.  The patient was extubated and brought to the recovery room in stable condition.  All sponge, instrument, and needle counts were correct prior to closure and at the conclusion of the case.   Estimated Blood Loss: Minimal          Complications: None; patient tolerated the procedure well.         Disposition: PACU - hemodynamically stable.         Condition: stable  Wilmon Arms. Corliss Skains, MD, [facility]  General Surgery   04/05/2023 9:47 AM

## 2023-04-05 NOTE — Anesthesia Procedure Notes (Signed)
Procedure Name: Intubation Date/Time: 04/05/2023 9:01 AM  Performed by: Marny Lowenstein, CRNAPre-anesthesia Checklist: Patient identified, Emergency Drugs available, Suction available and Patient being monitored Patient Re-evaluated:Patient Re-evaluated prior to induction Oxygen Delivery Method: Circle system utilized Preoxygenation: Pre-oxygenation with 100% oxygen Induction Type: IV induction Ventilation: Mask ventilation without difficulty Laryngoscope Size: Miller and 2 Grade View: Grade II Tube type: Oral Tube size: 7.0 mm Number of attempts: 1 Airway Equipment and Method: Patient positioned with wedge pillow and Stylet Placement Confirmation: ETT inserted through vocal cords under direct vision, positive ETCO2 and breath sounds checked- equal and bilateral Secured at: 21 cm Tube secured with: Tape Dental Injury: Teeth and Oropharynx as per pre-operative assessment  Comments: Pt with very poor dentition; multiple chips/cracks to the front teeth prior to instrumenting airway. All teeth intact after intubation; soft bite block placed

## 2023-04-05 NOTE — Progress Notes (Signed)
Patient using home CPAP independently. Daughter stated they did not need any sterile water and she was able to set unit up and help her mother get on. RT told them to call if they needed anything.

## 2023-04-05 NOTE — Discharge Instructions (Signed)
[facility] _______[facility]  UMBILICAL HERNIA REPAIR: POST OP INSTRUCTIONS  Always review your discharge instruction sheet given to you by the facility where your surgery was performed. IF YOU HAVE DISABILITY OR FAMILY LEAVE FORMS, YOU MUST BRING THEM TO THE OFFICE FOR PROCESSING.   DO NOT GIVE THEM TO YOUR DOCTOR.  1. A  prescription for pain medication may be given to you upon discharge.  Take your pain medication as prescribed, if needed.  If narcotic pain medicine is not needed, then you may take acetaminophen (Tylenol) or ibuprofen (Advil) as needed. 2. Take your usually prescribed medications unless otherwise directed. If you need a refill on your pain medication, please contact your pharmacy.  They will contact our office to request authorization. Prescriptions will not be filled after 5 pm or on week-ends. 3. You should follow a light diet the first 24 hours after arrival home, such as soup and crackers, etc.  Be sure to include lots of fluids daily.  Resume your normal diet the day after surgery. 4.Most patients will experience some swelling and bruising around the umbilicus or in the groin and scrotum.  Ice packs and reclining will help.  Swelling and bruising can take several days to resolve.  6. It is common to experience some constipation if taking pain medication after surgery.  Increasing fluid intake and taking a stool softener (such as Colace) will usually help or prevent this problem from occurring.  A mild laxative (Milk of Magnesia or Miralax) should be taken according to package directions if there are no bowel movements after 48 hours. 7. Unless discharge instructions indicate otherwise, you may remove your bandages 24-48 hours after surgery, and you may shower at that time.  You may have steri-strips (small skin tapes) in place directly over the incision.  These strips should be left on the skin for 7-10 days.  If your surgeon used skin glue on the incision, you may  shower in 24 hours.  The glue will flake off over the next 2-3 weeks.  Any sutures or staples will be removed at the office during your follow-up visit. 8. ACTIVITIES:  You may resume regular (light) daily activities beginning the next day--such as daily self-care, walking, climbing stairs--gradually increasing activities as tolerated.  You may have sexual intercourse when it is comfortable.  Refrain from any heavy lifting or straining until approved by your doctor.  a.You may drive when you are no longer taking prescription pain medication, you can comfortably wear a seatbelt, and you can safely maneuver your car and apply brakes. b.RETURN TO WORK:   _____________________________________________  9.You should see your doctor in the office for a follow-up appointment approximately 2-3 weeks after your surgery.  Make sure that you call for this appointment within a day or two after you arrive home to insure a convenient appointment time. 10.OTHER INSTRUCTIONS: _________________________    _____________________________________  WHEN TO CALL YOUR DOCTOR: Fever over 101.0 Inability to urinate Nausea and/or vomiting Extreme swelling or bruising Continued bleeding from incision. Increased pain, redness, or drainage from the incision  The clinic staff is available to answer your questions during regular business hours.  Please don't hesitate to call and ask to speak to one of the nurses for clinical concerns.  If you have a medical emergency, go to the nearest emergency room or call 911.  A surgeon from [facility] is always on call at the hospital   [address], [address], [place], [place]    [address] ?  [address] [address], [place], [place]   [address] [phone] ? [phone] ? FAX [phone] Web site: [email or web address]  

## 2023-04-05 NOTE — Transfer of Care (Signed)
Immediate Anesthesia Transfer of Care Note  Patient: Nicole Mccann  Procedure(s) Performed: HERNIA REPAIR UMBILICAL ADULT WITH MESH (Abdomen) INSERTION OF MESH (Abdomen)  Patient Location: PACU  Anesthesia Type:General  Level of Consciousness: awake and patient cooperative  Airway & Oxygen Therapy: Patient Spontanous Breathing and Patient connected to face mask oxygen  Post-op Assessment: Report given to RN and Post -op Vital signs reviewed and stable  Post vital signs: Reviewed and stable  Last Vitals:  Vitals Value Taken Time  BP 145/64 04/05/23 1003  Temp    Pulse 72 04/05/23 1005  Resp 12 04/05/23 1005  SpO2 98 % 04/05/23 1005  Vitals shown include unvalidated device data.  Last Pain:  Vitals:   04/05/23 0752  PainSc: 0-No pain         Complications: No notable events documented.

## 2023-04-05 NOTE — Interval H&P Note (Signed)
History and Physical Interval Note:  04/05/2023 7:25 AM  Nicole Mccann  has presented today for surgery, with the diagnosis of UMBILICAL HERNIA.  The various methods of treatment have been discussed with the patient and family. After consideration of risks, benefits and other options for treatment, the patient has consented to  Procedure(s): HERNIA REPAIR UMBILICAL ADULT WITH MESH (N/A) as a surgical intervention.  The patient's history has been reviewed, patient examined, no change in status, stable for surgery.  I have reviewed the patient's chart and labs.  Questions were answered to the patient's satisfaction.     [name]

## 2023-04-05 NOTE — Anesthesia Postprocedure Evaluation (Signed)
Anesthesia Post Note  Patient: Nicole Mccann  Procedure(s) Performed: HERNIA REPAIR UMBILICAL ADULT WITH MESH (Abdomen) INSERTION OF MESH (Abdomen)     Patient location during evaluation: PACU Anesthesia Type: General Level of consciousness: awake and alert Pain management: pain level controlled Vital Signs Assessment: post-procedure vital signs reviewed and stable Respiratory status: spontaneous breathing, nonlabored ventilation, respiratory function stable and patient connected to nasal cannula oxygen Cardiovascular status: blood pressure returned to baseline and stable Postop Assessment: no apparent nausea or vomiting Anesthetic complications: no   No notable events documented.  Last Vitals:  Vitals:   04/05/23 1045 04/05/23 1112  BP: (!) 134/59 111/61  Pulse: 68 73  Resp: 14 15  Temp: 36.7 C 36.4 C  SpO2: 100% 92%    Last Pain:  Vitals:   04/05/23 1112  TempSrc: Oral  PainSc:                  [name]

## 2023-04-06 DIAGNOSIS — K429 Umbilical hernia without obstruction or gangrene: Secondary | ICD-10-CM | POA: Diagnosis not present

## 2023-04-06 LAB — GLUCOSE, CAPILLARY
Glucose-Capillary: 183 mg/dL — ABNORMAL HIGH (ref 70–99)
Glucose-Capillary: 214 mg/dL — ABNORMAL HIGH (ref 70–99)
Glucose-Capillary: 224 mg/dL — ABNORMAL HIGH (ref 70–99)
Glucose-Capillary: 253 mg/dL — ABNORMAL HIGH (ref 70–99)
Glucose-Capillary: 275 mg/dL — ABNORMAL HIGH (ref 70–99)

## 2023-04-06 MED ORDER — TRAMADOL HCL 50 MG PO TABS: 50.0000 mg | ORAL_TABLET | Freq: Four times a day (QID) | ORAL | 0 refills | Status: DC | PRN

## 2023-04-06 MED ORDER — INSULIN ASPART 100 UNIT/ML IV SOLN
5.0000 [IU] | Freq: Once | INTRAVENOUS | Status: AC
  Administered 2023-04-06: 5 [IU] via INTRAVENOUS

## 2023-04-06 NOTE — Discharge Summary (Signed)
Physician Discharge Summary  Patient ID: Nicole Mccann MRN: [id number] DOB/AGE: [birth year] 79 y.o.  Admit date: 04/05/2023 Discharge date: 04/06/2023  Admission Diagnoses:  Umbilical hernia  Discharge Diagnoses: Same Principal Problem:   Umbilical hernia   Discharged Condition: good  Hospital Course: Open repair of umbilical hernia with mesh 04/05/23.  Kept overnight because of comorbidities.  Did well overnight with minimal discomfort. Ready for discharge.  Treatments: surgery: Umbilical hernia repair with mesh  Discharge Exam: Blood pressure 138/67, pulse 63, temperature 98.4 F (36.9 C), temperature source Oral, resp. rate 17, height 5' (1.524 m), weight 120.7 kg, SpO2 95 %. General appearance: alert, cooperative, and no distress GI: soft, obese, incisional tenderness Dressing dry with no drainage  Disposition: Discharge disposition: 01-Home or Self Care       Discharge Instructions     Call MD for:  persistant nausea and vomiting   Complete by: As directed    Call MD for:  redness, tenderness, or signs of infection (pain, swelling, redness, odor or green/yellow discharge around incision site)   Complete by: As directed    Call MD for:  severe uncontrolled pain   Complete by: As directed    Call MD for:  temperature >100.4   Complete by: As directed    Diet general   Complete by: As directed    Driving Restrictions   Complete by: As directed    Do not drive while taking pain medications   Increase activity slowly   Complete by: As directed    May shower / Bathe   Complete by: As directed       Allergies as of 04/06/2023       Reactions   Mushroom Ext Cmplx(shiitake-reishi-mait) Anaphylaxis   Shellfish Allergy Anaphylaxis   Sulfonamide Derivatives Anaphylaxis   Vancomycin Anaphylaxis   Fluticasone-salmeterol Other (See Comments)   Feels like something in the throat   Butalbital-aspirin-caffeine Other (See Comments)   UNSPECIFIED REACTION    Keflex  [cephalexin]    Nausea/vomitting   Montelukast Sodium Other (See Comments)   Feels like she's running out of breath   Penicillins    UNSPECIFIED REACTION      Childhood allergy Has patient had a PCN reaction causing immediate rash, facial/tongue/throat swelling, SOB or lightheadedness with hypotension: Unknown Has patient had a PCN reaction causing severe rash involving mucus membranes or skin necrosis: Unknown Has patient had a PCN reaction that required hospitalization: No Has patient had a PCN reaction occurring within the last 10 years: No If all of the above answers are "NO", then may proceed with Cephalosporin use.   Acetaminophen Other (See Comments)   Stomach discomfort   Clarithromycin Nausea And Vomiting   Iodinated Contrast Media Other (See Comments)   Bad headache; no prep needed   Levofloxacin Diarrhea, Nausea And Vomiting   Dizziness Believes it was due to a high dosage of this medication   Orphenadrine Nausea And Vomiting        Medication List     STOP taking these medications    doxycycline 100 MG capsule Commonly known as: VIBRAMYCIN   sodium chloride 0.65 % Soln nasal spray Commonly known as: OCEAN       TAKE these medications    albuterol 108 (90 Base) MCG/ACT inhaler Commonly known as: VENTOLIN HFA Inhale 2 puffs into the lungs every 6 (six) hours as needed for wheezing or shortness of breath.   AYR SALINE NASAL RINSE NA Place into the nose every other day.  bacitracin ointment Apply 1 Application topically 2 (two) times daily. What changed:  when to take this reasons to take this   budesonide-formoterol 160-4.5 MCG/ACT inhaler Commonly known as: SYMBICORT Inhale 2 puffs into the lungs 2 (two) times daily. What changed: when to take this   CALCIUM 600+D3 PO Take 1 tablet by mouth in the morning.   cetirizine 10 MG tablet Commonly known as: ZYRTEC Take 10 mg by mouth at bedtime.   dapagliflozin propanediol 10 MG Tabs  tablet Commonly known as: Farxiga Take 1 tablet (10 mg total) by mouth daily before breakfast.   DIGESTIVE ADVANTAGE PO Take 1 capsule by mouth in the morning.   Fish Oil 1200 MG Caps Take 1,200 mg by mouth in the morning.   FLUoxetine 20 MG capsule Commonly known as: PROZAC TAKE 1 CAPSULE BY MOUTH DAILY What changed: when to take this   gabapentin 100 MG capsule Commonly known as: NEURONTIN TAKE 1 CAPSULE BY MOUTH 3 TIMES  DAILY What changed:  how much to take when to take this additional instructions   glipiZIDE 2.5 MG 24 hr tablet Commonly known as: GLUCOTROL XL Take 1 tablet (2.5 mg total) by mouth daily with supper. What changed:  when to take this reasons to take this   ibuprofen 200 MG tablet Commonly known as: ADVIL Take 400-600 mg by mouth every 8 (eight) hours as needed (pain.).   Lido King 4 % Generic drug: lidocaine Place 1 patch onto the skin daily as needed (pain.).   loratadine 10 MG tablet Commonly known as: CLARITIN Take 10 mg by mouth in the morning.   Refresh Tears 0.5 % Soln Generic drug: carboxymethylcellulose Place 1-2 drops into both eyes 3 (three) times daily as needed (dry/irritated eyes.).   rizatriptan 10 MG tablet Commonly known as: Maxalt Take 1 tablet (10 mg total) by mouth as needed for migraine. May repeat in 2 hours if needed What changed: when to take this   rosuvastatin 10 MG tablet Commonly known as: CRESTOR TAKE 1 TABLET BY MOUTH DAILY   Sensitive Eyes Saline Soln by Does not apply route.   simethicone 125 MG chewable tablet Commonly known as: MYLICON Chew 125-250 mg by mouth at bedtime as needed for flatulence.   traMADol 50 MG tablet Commonly known as: ULTRAM Take 1 tablet (50 mg total) by mouth every 6 (six) hours as needed (mild pain). What changed: reasons to take this   Vitamin D 50 MCG (2000 UT) Caps Take 2,000 Units by mouth in the morning.   Voltaren Arthritis Pain 1 % Gel Generic drug: diclofenac  Sodium Apply 1 Application topically 4 (four) times daily as needed (pain.).        Follow-up Information     Manus Rudd, MD Follow up in 3 week(s).   Specialty: General Surgery Contact information: [address] [place] [address] [place] [address] [phone]                 Signed: [name] 04/06/2023, 7:57 AM

## 2023-04-06 NOTE — TOC Transition Note (Signed)
Transition of Care [facility]) - CM/SW Discharge Note   Patient Details  Name: Nicole Mccann MRN: [id number] Date of Birth: 1946/03/30  Transition of [facility] CM/SW Contact:  Harriet Masson, RN Phone Number: 04/06/2023, 8:46 AM   Clinical Narrative:     Patient stable for discharge.  No TOC needs at this time.  Final next level of care: Home/Self Care Barriers to Discharge: Barriers Resolved   Patient Goals and CMS Choice  Return home    Discharge Placement                 home        Discharge Plan and Services Additional resources added to the After Visit Summary for                                       Social Determinants of Health (SDOH) Interventions SDOH Screenings   Food Insecurity: No Food Insecurity (04/05/2023)  Housing: Low Risk  (04/05/2023)  Transportation Needs: Unknown (04/05/2023)  Utilities: Not At Risk (04/05/2023)  Alcohol Screen: Low Risk  (02/22/2023)  Depression (PHQ2-9): Low Risk  (03/21/2023)  Financial Resource Strain: Low Risk  (02/22/2023)  Physical Activity: Unknown (02/22/2023)  Social Connections: Socially Isolated (02/22/2023)  Stress: Stress Concern Present (02/22/2023)  Tobacco Use: Low Risk  (04/05/2023)     Readmission Risk Interventions     No data to display

## 2023-04-06 NOTE — Progress Notes (Signed)
Pt discharged to home in stable condition.  All discharge instructions reviewed with and given to pt.  Pt verbalized complete understanding of discharge instructions and to pick up medication from designated pharmacy.  Pt transported off unit via wheelchair with NT x1 at chairside to private vehicle for transport home.

## 2023-04-06 NOTE — Progress Notes (Signed)
BS 318 at [phone]. On call MD Lovick notified. MD advised recheck. Recheck 307. MD advised give 5 units of novolog IV recheck 30 min (275) Recheck 60 min after insulin (259) rechecked prior to additional 5 units novolog IV (253) 30 min following insulin (224)

## 2023-04-07 ENCOUNTER — Telehealth: Payer: Self-pay

## 2023-04-07 ENCOUNTER — Encounter (HOSPITAL_COMMUNITY): Payer: Self-pay | Admitting: Surgery

## 2023-04-07 NOTE — Transitions of Care (Post Inpatient/ED Visit) (Signed)
04/07/2023  Name: Nicole Mccann MRN: [id number] DOB: 1946-10-05  Today's TOC FU Call Status: Today's TOC FU Call Status:: Successful TOC FU Call Competed TOC FU Call Complete Date: 04/07/23  Transition Care Management Follow-up Telephone Call Date of Discharge: 04/06/23 Discharge Facility: [facility]) Type of Discharge: Inpatient Admission Primary Inpatient Discharge Diagnosis:: DM How have you been since you were released from the hospital?: Better Any questions or concerns?: No  Items Reviewed: Did you receive and understand the discharge instructions provided?: Yes Medications obtained,verified, and reconciled?: Yes (Medications Reviewed) Any new allergies since your discharge?: No Dietary orders reviewed?: Yes Do you have support at home?: Yes People in Home: child(ren), adult  Medications Reviewed Today: Medications Reviewed Today     Reviewed by Karena Addison, LPN (Licensed Practical Nurse) on 04/07/23 at 0930  Med List Status: <None>   Medication Order Taking? Sig Documenting Provider Last Dose Status Informant  albuterol (VENTOLIN HFA) 108 (90 Base) MCG/ACT inhaler [id number] Yes Inhale 2 puffs into the lungs every 6 (six) hours as needed for wheezing or shortness of breath. Copland, Gwenlyn Found, MD Taking Active Family Member  bacitracin ointment [id number] Yes Apply 1 Application topically 2 (two) times daily.  Patient taking differently: Apply 1 Application topically 2 (two) times daily as needed for wound care.   Charlynne Pander, MD Taking Active Family Member  budesonide-formoterol [facility]) 160-4.5 MCG/ACT inhaler [id number] Yes Inhale 2 puffs into the lungs 2 (two) times daily.  Patient taking differently: Inhale 2 puffs into the lungs in the morning.   Hunsucker, Lesia Sago, MD Taking Active Family Member  Calcium Carb-Cholecalciferol (CALCIUM 600+D3 PO) [id number] Yes Take 1 tablet by mouth in the morning. [provider] Taking Active Family  Member  carboxymethylcellulose (REFRESH TEARS) 0.5 % SOLN [id number] Yes Place 1-2 drops into both eyes 3 (three) times daily as needed (dry/irritated eyes.). [provider] Taking Active Family Member  cetirizine (ZYRTEC) 10 MG tablet [id number] Yes Take 10 mg by mouth at bedtime. [provider] Taking Active Family Member  Cholecalciferol (VITAMIN D) 50 MCG (2000 UT) CAPS [id number] Yes Take 2,000 Units by mouth in the morning. [provider] Taking Active Family Member  dapagliflozin propanediol (FARXIGA) 10 MG TABS tablet [id number] Yes Take 1 tablet (10 mg total) by mouth daily before breakfast. Carlus Pavlov, MD Taking Active Family Member  diclofenac Sodium (VOLTAREN ARTHRITIS PAIN) 1 % GEL [id number] Yes Apply 1 Application topically 4 (four) times daily as needed (pain.). [provider] Taking Active Family Member  FLUoxetine (PROZAC) 20 MG capsule [id number] Yes TAKE 1 CAPSULE BY MOUTH DAILY  Patient taking differently: Take 20 mg by mouth every evening.   Copland, Gwenlyn Found, MD Taking Active Family Member  gabapentin (NEURONTIN) 100 MG capsule [id number] Yes TAKE 1 CAPSULE BY MOUTH 3 TIMES  DAILY  Patient taking differently: Take 100-200 mg by mouth See admin instructions. Take 1 capsule (100 mg) by mouth in the morning, take 1 capsule (100 mg) by mouth midday & take 2 capsules (200 mg) by mouth at bedtime.   Copland, Gwenlyn Found, MD Taking Active Family Member           Med Note Sueanne Margarita Mar 29, 2023 11:53 AM)    glipiZIDE (GLUCOTROL XL) 2.5 MG 24 hr tablet [id number] Yes Take 1 tablet (2.5 mg total) by mouth daily with supper.  Patient taking differently: Take 2.5 mg by mouth daily as needed (with sugar consumption).  Carlus Pavlov, MD Taking Active Family Member           Med Note Sueanne Margarita Mar 29, 2023 11:58 AM)    ibuprofen (ADVIL) 200 MG tablet [id number] Yes Take 400-600 mg by mouth every 8 (eight) hours as  needed (pain.). [provider] Taking Active Family Member  lidocaine (LIDO KING) 4 % [id number] Yes Place 1 patch onto the skin daily as needed (pain.). [provider] Taking Active Family Member  loratadine (CLARITIN) 10 MG tablet [id number] Yes Take 10 mg by mouth in the morning. [provider] Taking Active Family Member  Omega-3 Fatty Acids (FISH OIL) 1200 MG CAPS [id number] Yes Take 1,200 mg by mouth in the morning. [provider] Taking Active Family Member  Probiotic Product (DIGESTIVE ADVANTAGE PO) [id number] Yes Take 1 capsule by mouth in the morning. [provider] Taking Active Family Member  rizatriptan (MAXALT) 10 MG tablet [id number] Yes Take 1 tablet (10 mg total) by mouth as needed for migraine. May repeat in 2 hours if needed  Patient taking differently: Take 10 mg by mouth daily as needed for migraine. May repeat in 2 hours if needed   Copland, Gwenlyn Found, MD Taking Active Family Member  rosuvastatin (CRESTOR) 10 MG tablet [id number] Yes TAKE 1 TABLET BY MOUTH DAILY Carlus Pavlov, MD Taking Active Family Member  simethicone (MYLICON) 125 MG chewable tablet [id number] Yes Chew 125-250 mg by mouth at bedtime as needed for flatulence. [provider] Taking Active Family Member  Sodium Chloride-Sodium Bicarb (AYR SALINE NASAL RINSE NA) [id number] Yes Place into the nose every other day. [provider] Taking Active   Soft Lens Products (SENSITIVE EYES SALINE) SOLN [id number] Yes by Does not apply route. [provider] Taking Active Family Member  traMADol (ULTRAM) 50 MG tablet [id number] Yes Take 1 tablet (50 mg total) by mouth every 6 (six) hours as needed (mild pain). Manus Rudd, MD Taking Active             Home Care and Equipment/Supplies: Were Home Health Services Ordered?: NA Any new equipment or medical supplies ordered?: NA  Functional Questionnaire: Do you need assistance with  bathing/showering or dressing?: No Do you need assistance with meal preparation?: No Do you need assistance with eating?: No Do you have difficulty maintaining continence: No Do you need assistance with getting out of bed/getting out of a chair/moving?: No Do you have difficulty managing or taking your medications?: No  Follow up appointments reviewed: Specialist Hospital Follow-up appointment confirmed?: Yes Date of Specialist follow-up appointment?: 04/27/23 Follow-Up Specialty Provider:: Dr Corliss Skains Do you need transportation to your follow-up appointment?: No Do you understand care options if your condition(s) worsen?: Yes-patient verbalized understanding    SIGNATURE Karena Addison, LPN [facility] Nurse Health Advisor Direct Dial [phone]

## 2023-04-08 ENCOUNTER — Other Ambulatory Visit: Payer: Self-pay

## 2023-04-16 NOTE — Progress Notes (Unsigned)
Healthcare at [facility] [address] [place], [place] [address] [phone]  Date:  04/18/2023   Name:  Nicole Mccann   DOB:  01-20-1946   MRN:  [id number]  PCP:  Pearline Cables, MD    Chief Complaint: No chief complaint on file.   History of Present Illness:  Nicole Mccann is a 77 y.o. very pleasant female patient who presents with the following:  Patient seen today for follow-up-she was admitted and had a hernia repair at the end of May Admitted overnight 5/28 through 04/06/2023 for umbilical hernia with mesh repair Previous visit with myself was on May 13-Notes from that visit: Most recent visit with myself was about 1 month ago when she came in with concern of periumbilical pain and cellulitis.  History of controlled diabetes with polyneuropathy, obesity, hyperlipidemia, seizure disorder, sleep apnea on CPAP, venous insufficiency with swelling of bilateral lower extremities, asthma  She had a CT scan which showed an umbilical hernia, fat tissue incarcerated Dr Corliss Skains consulted and felt that she does need a hernia repair given her symptoms and risk of complications Cardiology saw her for preop on May 3-cleared for surgery without further testing [place] Sink wonders what I think about surgery.  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  She also occasionally has cramping in her legs  Labs from hospitalization for surgery shows stable renal function, stable hemoglobin  Lab Results  Component Value Date   HGBA1C 7.2 (H) 04/01/2023    Patient Active Problem List   Diagnosis Date Noted   Umbilical hernia  04/05/2023   Open wound of skin 12/22/2022   Class 3 obesity with alveolar hypoventilation, serious comorbidity, and body mass index (BMI) of 50.0 to 59.9 in adult (HCC) 08/10/2022   Gait disorder 08/10/2022   SOB (shortness of breath) 05/12/2021   Diastolic dysfunction 05/12/2021   Chronic diastolic heart failure (HCC) 05/12/2021   Morbid obesity (HCC) 05/12/2021   PSVT (paroxysmal supraventricular tachycardia) 05/12/2021   Hypertriglyceridemia 05/12/2021   Diabetes mellitus due to underlying condition with hyperosmolarity without coma, without long-term current use of insulin (HCC) 05/12/2021   Olfactory aura    Low blood pressure    Hyperlipidemia    Depression    Anxiety    Allergy    Bilateral headaches 12/09/2020   Mixed hyperlipidemia 07/10/2019   Dizziness and giddiness 12/22/2016   Osteopenia 09/21/2016   Low back pain 05/26/2016   Right shoulder pain 04/04/2015   Adjustment disorder with mixed anxiety and depressed mood 03/14/2015   Rotator cuff (capsule) sprain 03/13/2015   Pseudoptosis 11/18/2014   Seizure disorder, temporal lobe, intractable (HCC) 10/17/2014   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/2014   Severe obesity (BMI >= 40) (HCC) 10/17/2014   OSA on CPAP 10/17/2014   Hypersomnia with sleep apnea, unspecified 05/22/2014   Injury of left knee 08/22/2013   Shingles 07/2013   Variants of migraine, not elsewhere classified, without mention of intractable migraine without mention of status migrainosus 05/22/2013   Disturbances of sensation of smell and taste 05/22/2013   Spinal stenosis of lumbar region 12/17/2012   Pulmonary embolism (HCC)  07/09/2012   DVT (deep venous thrombosis) (HCC) 07/07/2012   Atypical seizure (HCC) 06/07/2012   Phlebitis following infusion 06/05/2012   BENIGN POSITIONAL VERTIGO 11/06/2009   Type 2 diabetes, controlled, with peripheral neuropathy (HCC) 04/04/2009   Allergic rhinitis 04/04/2009   ASTHMA 04/04/2009   Diverticulosis of colon  04/04/2009    Past Medical History:  Diagnosis Date   Adjustment disorder with mixed anxiety and depressed mood 03/14/2015   Dr Madaline Guthrie actively treating [place]    ALLERGIC RHINITIS 04/04/2009   Qualifier: Diagnosis of  By: Alwyn Ren MD, Chrissie Noa     Allergy    Anxiety    Dr Madaline Guthrie   Arthritis    Asthma    ASTHMA 04/04/2009   Qualifier: Diagnosis of  By: Alwyn Ren MD, [name]   Status Asthmaticus 10/2004     Atypical seizure (HCC) 06/07/2012   BENIGN POSITIONAL VERTIGO 11/06/2009   Qualifier: Diagnosis of  By: Linford Arnold MD, Catherine     Complication of anesthesia    Depression    Dr Madaline Guthrie   Diabetes mellitus    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 08/17/13 seen in the emergency room; imaging negative. No labs. Referred to Dr. Pearletha Forge who recommended nonsteroidals and elevation. 10/18-11/3/14 in [place] for land and river cruise trip.  08/29/13 seen in ER in [place]; topical antibiotics & oral Ciprobay Rxed. 10/23 at second ER I&D declined ; wound   Low back pain 05/26/2016   Low blood pressure    Mixed hyperlipidemia 07/10/2019   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/2014   Olfactory aura    OSA on CPAP    Osteopenia 09/21/2016   dexa 09/2016   Phlebitis following infusion 06/05/2012   Onset 06/01/12;S/P Dilantin infusion 05/30/12 @ [facility] for atypical seizure activity with response 06/05/12 DVT of Cephalic vein elbow- mid upper arm. Xarelto 15 mg initiated 06/05/12 by Dr Jeraldine Loots , [facility] and increased to one pill twice a day. Despite this she had persistent  thrombosis in the cephalic vein in the elbow and the upper forearm areas prompting change to Lovenox 110 mg e   Pulmonary embolism (HCC) 07/09/2012   Presumed based on intermediate VQ 07/07/2012 and high clinical probability.  Definitive CT angiogram was not possible due to contrast  intolerance/allergy    Right shoulder pain 04/04/2015   Rotator cuff (capsule) sprain 03/13/2015   Seizure disorder, temporal lobe, intractable (HCC) 10/17/2014   Severe obesity (BMI >= 40) (HCC) 10/17/2014   Shingles 07/2013   Spinal stenosis of lumbar region 12/17/2012   S/P suboptimal response to Chiropractry ;Dr Kai Levins referred her  to Dr Sherwood [facility] [phone]; FAX [phone] MRI 12/15/12 : stenosis @ L3-4; L 4-5  in context of DDD ESI recommended . 12/17/12 Note: I recommended discussion with [name],Neurologist; & Dr Margaretmary Bayley, Diabetologist    Type 2 diabetes, controlled, with peripheral neuropathy (HCC) 04/04/2009   Qualifier: Diagnosis of  By: Alwyn Ren MD, Chrissie Noa   Dr Margaretmary Bayley, Endo Dr Emily Filbert , Ophth seen anually  Variants of migraine, not elsewhere classified, without mention of intractable migraine without mention of status migrainosus 05/22/2013    Past Surgical History:  Procedure Laterality Date   APPENDECTOMY     ARTERY BIOPSY Left 04/14/2018   Procedure: BIOPSY TEMPORAL ARTERY;  Surgeon: Sherren Kerns, MD;  Location: [facility] OR;  Service: Vascular;  Laterality: Left;   BREAST EXCISIONAL BIOPSY Right 1998   CHOLECYSTECTOMY     COLONOSCOPY  2006   [facility]   INSERTION OF MESH N/A 04/05/2023   Procedure: INSERTION OF MESH;  Surgeon: Manus Rudd, MD;  Location: [facility] OR;  Service: General;  Laterality: N/A;   KNEE SURGERY Left    rotator cuff surgery     TONSILLECTOMY      UMBILICAL HERNIA REPAIR N/A 04/05/2023   Procedure: HERNIA REPAIR UMBILICAL ADULT WITH MESH;  Surgeon: Manus Rudd, MD;  Location: [facility] OR;  Service: General;  Laterality: N/A;    Social  History   Tobacco Use   Smoking status: Never   Smokeless tobacco: Never  Vaping Use   Vaping Use: Never used  Substance Use Topics   Alcohol use: Yes    Alcohol/week: 3.0 standard drinks of alcohol    Types: 3 Glasses of wine per week    Comment: 1/2 glass wine daily   Drug use: No    Family History  Problem Relation Age of Onset   Stomach cancer Father    Coronary artery disease Father    Breast cancer Sister 54       breast cancer   Diabetes Maternal Aunt    Diabetes Paternal Aunt    Prostate cancer Brother    Cancer Daughter    Stroke Neg Hx     Allergies  Allergen Reactions   Mushroom Ext Cmplx(Shiitake-Reishi-Mait) Anaphylaxis   Shellfish Allergy Anaphylaxis   Sulfonamide Derivatives Anaphylaxis   Vancomycin Anaphylaxis   Fluticasone-Salmeterol Other (See Comments)    Feels like something in the throat   Butalbital-Aspirin-Caffeine Other (See Comments)    UNSPECIFIED REACTION    Keflex [Cephalexin]     Nausea/vomitting   Montelukast Sodium Other (See Comments)    Feels like she's running out of breath   Penicillins     UNSPECIFIED REACTION      Childhood allergy Has patient had a PCN reaction causing immediate rash, facial/tongue/throat swelling, SOB or lightheadedness with hypotension: Unknown Has patient had a PCN reaction causing severe rash involving mucus membranes or skin necrosis: Unknown Has patient had a PCN reaction that required hospitalization: No Has patient had a PCN reaction occurring within the last 10 years: No If all of the above answers are "NO", then may proceed with Cephalosporin use.     Acetaminophen Other (See Comments)    Stomach discomfort   Clarithromycin Nausea And Vomiting   Iodinated Contrast Media Other (See Comments)    Bad headache; no prep needed   Levofloxacin Diarrhea and Nausea And Vomiting    Dizziness Believes it was due to a high dosage of this medication   Orphenadrine Nausea And Vomiting    Medication list  has been reviewed and updated.  Current Outpatient Medications on File Prior to Visit  Medication Sig Dispense Refill   albuterol (VENTOLIN HFA) 108 (90 Base) MCG/ACT inhaler Inhale 2 puffs into the lungs every 6 (six) hours as needed for wheezing or shortness of breath. 18 g 6   bacitracin ointment Apply 1 Application topically 2 (two) times daily. (Patient taking differently: Apply 1 Application  topically 2 (two) times daily as needed for wound care.) 120 g 0   budesonide-formoterol (SYMBICORT) 160-4.5 MCG/ACT inhaler Inhale 2 puffs into the lungs 2 (two) times daily. (Patient taking differently: Inhale 2 puffs into the lungs in the morning.) 1 each 12   Calcium Carb-Cholecalciferol (CALCIUM 600+D3 PO) Take 1 tablet by mouth in the morning.     carboxymethylcellulose (REFRESH TEARS) 0.5 % SOLN Place 1-2 drops into both eyes 3 (three) times daily as needed (dry/irritated eyes.).     cetirizine (ZYRTEC) 10 MG tablet Take 10 mg by mouth at bedtime.     Cholecalciferol (VITAMIN D) 50 MCG (2000 UT) CAPS Take 2,000 Units by mouth in the morning.     dapagliflozin propanediol (FARXIGA) 10 MG TABS tablet Take 1 tablet (10 mg total) by mouth daily before breakfast. 90 tablet 3   diclofenac Sodium (VOLTAREN ARTHRITIS PAIN) 1 % GEL Apply 1 Application topically 4 (four) times daily as needed (pain.).     FLUoxetine (PROZAC) 20 MG capsule TAKE 1 CAPSULE BY MOUTH DAILY (Patient taking differently: Take 20 mg by mouth every evening.) 90 capsule 3   gabapentin (NEURONTIN) 100 MG capsule TAKE 1 CAPSULE BY MOUTH 3 TIMES  DAILY (Patient taking differently: Take 100-200 mg by mouth See admin instructions. Take 1 capsule (100 mg) by mouth in the morning, take 1 capsule (100 mg) by mouth midday & take 2 capsules (200 mg) by mouth at bedtime.) 270 capsule 3   glipiZIDE (GLUCOTROL XL) 2.5 MG 24 hr tablet Take 1 tablet (2.5 mg total) by mouth daily with supper. (Patient taking differently: Take 2.5 mg by mouth daily as  needed (with sugar consumption).) 30 tablet 3   ibuprofen (ADVIL) 200 MG tablet Take 400-600 mg by mouth every 8 (eight) hours as needed (pain.).     lidocaine (LIDO KING) 4 % Place 1 patch onto the skin daily as needed (pain.).     loratadine (CLARITIN) 10 MG tablet Take 10 mg by mouth in the morning.     Omega-3 Fatty Acids (FISH OIL) 1200 MG CAPS Take 1,200 mg by mouth in the morning.     Probiotic Product (DIGESTIVE ADVANTAGE PO) Take 1 capsule by mouth in the morning.     rizatriptan (MAXALT) 10 MG tablet Take 1 tablet (10 mg total) by mouth as needed for migraine. May repeat in 2 hours if needed (Patient taking differently: Take 10 mg by mouth daily as needed for migraine. May repeat in 2 hours if needed) 12 tablet 3   rosuvastatin (CRESTOR) 10 MG tablet TAKE 1 TABLET BY MOUTH DAILY 90 tablet 1   simethicone (MYLICON) 125 MG chewable tablet Chew 125-250 mg by mouth at bedtime as needed for flatulence.     Sodium Chloride-Sodium Bicarb (AYR SALINE NASAL RINSE NA) Place into the nose every other day.     Soft Lens Products (SENSITIVE EYES SALINE) SOLN by Does not apply route.     traMADol (ULTRAM) 50 MG tablet Take 1 tablet (50 mg total) by mouth every 6 (six) hours as needed (mild pain). 20 tablet 0   No current facility-administered medications on file prior to visit.    Review of Systems:  As per HPI- otherwise negative.   Physical Examination: There were no vitals filed for this visit. There were no vitals filed for this visit. There is no height or weight on file to calculate BMI. Ideal Body Weight:    GEN: no acute distress. HEENT: Atraumatic, Normocephalic.  Ears and Nose: No external deformity. CV: RRR, No M/G/R. No JVD. No thrill. No extra heart sounds. PULM: CTA B, no wheezes, crackles, rhonchi. No retractions. No resp. distress. No accessory muscle use. ABD: S, NT, ND, +BS. No rebound. No HSM. EXTR: No c/c/e PSYCH: Normally interactive. Conversant.    Assessment  and Plan: ***  Signed Abbe Amsterdam, MD

## 2023-04-18 ENCOUNTER — Ambulatory Visit: Payer: Medicare Other | Admitting: Family Medicine

## 2023-04-18 VITALS — BP 118/80 | HR 69 | Temp 98.0°F | Resp 18 | Ht 60.0 in | Wt 277.2 lb

## 2023-04-18 DIAGNOSIS — Z8719 Personal history of other diseases of the digestive system: Secondary | ICD-10-CM | POA: Diagnosis not present

## 2023-04-18 DIAGNOSIS — Z9889 Other specified postprocedural states: Secondary | ICD-10-CM | POA: Diagnosis not present

## 2023-04-18 DIAGNOSIS — R102 Pelvic and perineal pain: Secondary | ICD-10-CM | POA: Diagnosis not present

## 2023-04-18 DIAGNOSIS — R600 Localized edema: Secondary | ICD-10-CM | POA: Diagnosis not present

## 2023-04-18 MED ORDER — FUROSEMIDE 20 MG PO TABS: 20.0000 mg | ORAL_TABLET | Freq: Every day | ORAL | 3 refills | Status: DC

## 2023-04-18 NOTE — Patient Instructions (Signed)
It was good to see you today, I am glad you did well with your surgery.  You are retaining some fluid and your weight is up from usual-this is likely due to not moving around as much after your operation  Wt Readings from Last 3 Encounters:  04/18/23 277 lb 3.2 oz (125.7 kg)  04/05/23 266 lb 3.2 oz (120.7 kg)  04/01/23 266 lb 3.2 oz (120.7 kg)   Please take the furosemide, 1 or 2 pills daily as needed to help reduce leg swelling and get your weight back down to baseline.  Please let me know if you are not dropping weight over the next 3-4 days   If you can get sequential compression devices for your legs at home that would be terrific.  Please go to your local medical supply store and see if they can help  .

## 2023-05-27 ENCOUNTER — Ambulatory Visit (INDEPENDENT_AMBULATORY_CARE_PROVIDER_SITE_OTHER): Payer: Medicare Other | Admitting: Internal Medicine

## 2023-05-27 ENCOUNTER — Encounter: Payer: Self-pay | Admitting: Internal Medicine

## 2023-05-27 VITALS — BP 130/76 | HR 86 | Ht 60.0 in | Wt 282.0 lb

## 2023-05-27 DIAGNOSIS — E1142 Type 2 diabetes mellitus with diabetic polyneuropathy: Secondary | ICD-10-CM | POA: Diagnosis not present

## 2023-05-27 DIAGNOSIS — E782 Mixed hyperlipidemia: Secondary | ICD-10-CM | POA: Diagnosis not present

## 2023-05-27 DIAGNOSIS — Z7984 Long term (current) use of oral hypoglycemic drugs: Secondary | ICD-10-CM | POA: Diagnosis not present

## 2023-05-27 DIAGNOSIS — E119 Type 2 diabetes mellitus without complications: Secondary | ICD-10-CM

## 2023-05-27 MED ORDER — GLIPIZIDE ER 2.5 MG PO TB24: 2.5000 mg | ORAL_TABLET | Freq: Two times a day (BID) | ORAL | 3 refills | Status: DC

## 2023-05-27 NOTE — Progress Notes (Signed)
Patient ID: Nicole Mccann, female   DOB: [birth year], 77 y.o.   MRN: [id number]   HPI: Nicole Mccann is a 77 y.o.-year-old female, presenting for follow-up for DM2, dx in initially in 1965 with GDM, then again GDM in 1974, non-insulin-dependent, controlled, with long-term complications (PN).  Last visit 6 months ago. She is here with her daughter who offers part of the history especially related to her blood sugars, diet, medications.  Interim history: No blurry vision, nausea, chest pain. She has shortness of breath (asthma).  She also mentions stress incontinence.  She wears Depends. She continues to not be very mobile at home.  She has leg swelling.  She also has back pain-on lidocaine patches. She had umbilical hernia repair sx 03/2023. Glu 307 then.  She has increased stress with her husband being sick.  Reviewed HbA1c levels: Lab Results  Component Value Date   HGBA1C 7.2 (H) 04/01/2023   HGBA1C 6.4 (A) 11/26/2022   HGBA1C 6.9 (H) 09/08/2022   HGBA1C 6.7 (A) 06/04/2022   HGBA1C 6.7 (A) 01/28/2022   HGBA1C 6.8 (H) 09/09/2021   HGBA1C 6.5 11/26/2020   HGBA1C 6.8 (A) 09/05/2020   HGBA1C 6.6 (A) 05/02/2020   HGBA1C 6.3 (A) 01/09/2019   HGBA1C 5.8 (A) 09/05/2018   HGBA1C 6.3 (A) 04/18/2018   HGBA1C 6.4 12/12/2017   HGBA1C 6.2 02/09/2017   HGBA1C 6.1 01/21/2016   HGBA1C 6.0 (H) 07/08/2012   HGBA1C 6.1 (H) 07/07/2012   HGBA1C 5.8 02/13/2010   HGBA1C 6.0 06/22/2007   HGBA1C 6.0 03/20/2007   She was on: - Glipizide XL 2.5-5 mg (approximately 40 minutes) before breakfast  SGLT2 inh were very expensive.  We had to stop Metformin ER due to significant diarrhea.  We changed to: - Farxiga 10 mg before b'fast - from PAP - Glipizide ER 2.5 mg before a larger meal   Pt checks her sugars once a day: - am:  131-157 >> 132-154 >> 128-154, 171 >> 140-150s - 2h after b'fast: n/c >> 186, 198 >> 120 >> 134 >> N/c - before lunch: 130-150 >> 144-184 >> 120, 170  >> n/c - 2h after  lunch: n/c >> 133 >> n/c >> 164, 185/225 >> n/c >> 140s - before dinner:  n/c >> 129-132 >> 114-146 >> 124 >> 140  - 2h after dinner: 127-139 >> n/c >> 184, 184/191 >> n/c >. <160 - bedtime: n/c - nighttime: n/c Lowest sugar was 90 ...   >> 128 >> 130 ; she has hypoglycemia awareness in the 70s. Highest: 167 >> 225 >> 171 >> 170.  Glucometer: One Touch Verio  Pt's meals are: - Breakfast: cereals, yoghurt (Activia), pastries, tea, honey >> f+ greek yoghurt + Kashi cereal - Lunch: salad, leftover sandwich, fruit - Dinner: meat, veggies, potato - Snacks: Sweets, sweet liquor Fredric Mare) >> stopped due to headaches  No CKD, last BUN/creatinine:  Lab Results  Component Value Date   BUN 13 04/01/2023   BUN 15 02/23/2023   CREATININE 0.89 04/01/2023   CREATININE 0.88 02/23/2023   + HL; last set of lipids: Lab Results  Component Value Date   CHOL 182 09/08/2022   HDL 55.70 09/08/2022   LDLCALC 89 09/08/2022   LDLDIRECT 92.0 09/05/2020   TRIG 189.0 (H) 09/08/2022   CHOLHDL 3 09/08/2022  She refused statins in the past but we were able to start rosuvastatin 10 mg daily, which she now tolerates well.  In 08/2020  we added fish oil 1 capsule of  1000 mg a day  - Last eye exam was on 09/23/2022: no DR, + cataracts. She will need palpebral lifting surgery and cataract sx.  - no numbness and tingling in her feet.  Last foot exam 11/2022. On Gabapentin.  She does have burning around her ankles and lower leg due to the leg swelling.  Pt has FH of DM in aunts.  She has OSA and is on a CPAP.  In summer 2019, she had hAs >> temporal artery Bx negative. She developed anaphylaxis to Vancomycin while in the hospital.  ROS: + see HPI  Past Medical History:  Diagnosis Date   Adjustment disorder with mixed anxiety and depressed mood 03/14/2015   Dr Madaline Guthrie actively treating [place]    ALLERGIC RHINITIS 04/04/2009   Qualifier: Diagnosis of  By: Alwyn Ren MD, Chrissie Noa     Allergy    Anxiety    Dr  Madaline Guthrie   Arthritis    Asthma    ASTHMA 04/04/2009   Qualifier: Diagnosis of  By: Alwyn Ren MD, [name]   Status Asthmaticus 10/2004     Atypical seizure (HCC) 06/07/2012   BENIGN POSITIONAL VERTIGO 11/06/2009   Qualifier: Diagnosis of  By: Linford Arnold MD, Catherine     Complication of anesthesia    Depression    Dr Madaline Guthrie   Diabetes mellitus    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 08/17/13 seen in the emergency room; imaging negative. No labs. Referred to Dr. Pearletha Forge who recommended nonsteroidals and elevation. 10/18-11/3/14 in [place] for land and river cruise trip.  08/29/13 seen in ER in [place]; topical antibiotics & oral Ciprobay Rxed. 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 Xarelto 15 mg initiated 06/05/12 by Dr Jeraldine Loots , [facility] and increased to one pill twice a day. Despite this she had persistent thrombosis in the cephalic vein in the elbow and the upper forearm areas prompting change to Lovenox 110 mg e   Pulmonary embolism (HCC) 07/09/2012   Presumed based on intermediate VQ 07/07/2012 and high clinical probability.  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 12/17/12 Note: I recommended discussion  with [name],Neurologist; & Dr Margaretmary Bayley, Diabetologist    Type 2 diabetes, controlled, with peripheral neuropathy (HCC) 04/04/2009   Qualifier: Diagnosis of  By: Alwyn Ren MD, Chrissie Noa   Dr Margaretmary Bayley, Endo Dr Emily Filbert , Ophth seen anually     Variants of migraine, not elsewhere classified, without mention of intractable migraine without mention of status migrainosus 05/22/2013   Past Surgical History:  Procedure Laterality Date   APPENDECTOMY     ARTERY BIOPSY Left 04/14/2018   Procedure: BIOPSY TEMPORAL ARTERY;  Surgeon: Sherren Kerns, MD;  Location[facility] OR;  Service: Vascular;  Laterality: Left;   BREAST EXCISIONAL BIOPSY Right 1998   CHOLECYSTECTOMY     COLONOSCOPY  2006   [facility]   INSERTION OF MESH N/A 04/05/2023   Procedure: INSERTION OF MESH;  Surgeon: Manus Rudd, MD;  Location: [facility] OR;  Service: General;  Laterality: N/A;   KNEE SURGERY Left    rotator cuff surgery     TONSILLECTOMY      UMBILICAL HERNIA REPAIR N/A 04/05/2023   Procedure: HERNIA REPAIR UMBILICAL ADULT WITH MESH;  Surgeon: Manus Rudd, MD;  Location: [facility] OR;  Service: General;  Laterality: N/A;   Social History   Socioeconomic History   Marital status: Married    Spouse name: Lars Mage   Number of children: 2   Years of education: [employer] education level: Bachelor's degree (e.g., BA, AB, BS)  Occupational History   Not on file  Tobacco Use   Smoking status: Never    Smokeless tobacco: Never  Vaping Use   Vaping status: Never Used  Substance and Sexual Activity   Alcohol use: Yes    Alcohol/week: 3.0 standard drinks of alcohol    Types: 3 Glasses of wine per week    Comment: 1/2 glass wine daily   Drug use: No   Sexual activity: Never  Other Topics Concern   Not on file  Social History Narrative   Patient is married Lars Mage) and lives at home with her husband.   Patient has two adult children.   Patient is retired.   Patient has a college education.   Patient is right-handed.   Patient drinks one cup of tea daily.   Social Determinants of Health   Financial Resource Strain: Low Risk  (02/22/2023)   Overall Financial Resource Strain (CARDIA)    Difficulty of Paying Living Expenses: Not very hard  Food Insecurity: No Food Insecurity (04/05/2023)   Hunger Vital Sign    Worried About Running Out of Food in the Last Year: Never true    Ran Out of Food in the Last Year: Never true  Transportation Needs: Unknown (04/05/2023)   PRAPARE - Administrator, Civil Service (Medical): Patient declined    Lack of Transportation (Non-Medical): No  Physical Activity: Unknown (02/22/2023)   Exercise Vital Sign    Days of Exercise per Week: 0 days    Minutes of Exercise per Session: Not on file  Stress: Stress Concern Present (02/22/2023)   [employer] of Occupational Health - Occupational Stress Questionnaire    Feeling of Stress : Rather much  Social Connections: Socially Isolated (02/22/2023)   Social Connection and Isolation Panel [NHANES]    Frequency of Communication with Friends and Family: Once a week    Frequency of Social Gatherings with Friends and Family: Once a week    Attends Religious Services: Never    Database administrator or Organizations: No  Attends Banker Meetings: Not on file    Marital Status: Married  Intimate Partner Violence: Not At Risk (04/05/2023)   Humiliation, Afraid, Rape, and Kick  questionnaire    Fear of Current or Ex-Partner: No    Emotionally Abused: No    Physically Abused: No    Sexually Abused: No   Current Outpatient Medications on File Prior to Visit  Medication Sig Dispense Refill   albuterol (VENTOLIN HFA) 108 (90 Base) MCG/ACT inhaler Inhale 2 puffs into the lungs every 6 (six) hours as needed for wheezing or shortness of breath. 18 g 6   bacitracin ointment Apply 1 Application topically 2 (two) times daily. (Patient taking differently: Apply 1 Application topically 2 (two) times daily as needed for wound care.) 120 g 0   budesonide-formoterol (SYMBICORT) 160-4.5 MCG/ACT inhaler Inhale 2 puffs into the lungs 2 (two) times daily. (Patient taking differently: Inhale 2 puffs into the lungs in the morning.) 1 each 12   Calcium Carb-Cholecalciferol (CALCIUM 600+D3 PO) Take 1 tablet by mouth in the morning.     carboxymethylcellulose (REFRESH TEARS) 0.5 % SOLN Place 1-2 drops into both eyes 3 (three) times daily as needed (dry/irritated eyes.).     cetirizine (ZYRTEC) 10 MG tablet Take 10 mg by mouth at bedtime.     Cholecalciferol (VITAMIN D) 50 MCG (2000 UT) CAPS Take 2,000 Units by mouth in the morning.     dapagliflozin propanediol (FARXIGA) 10 MG TABS tablet Take 1 tablet (10 mg total) by mouth daily before breakfast. 90 tablet 3   diclofenac Sodium (VOLTAREN ARTHRITIS PAIN) 1 % GEL Apply 1 Application topically 4 (four) times daily as needed (pain.).     FLUoxetine (PROZAC) 20 MG capsule TAKE 1 CAPSULE BY MOUTH DAILY (Patient taking differently: Take 20 mg by mouth every evening.) 90 capsule 3   furosemide (LASIX) 20 MG tablet Take 1-2 tablets (20-40 mg total) by mouth daily. Use as needed for lower extremity swelling 40 tablet 3   gabapentin (NEURONTIN) 100 MG capsule TAKE 1 CAPSULE BY MOUTH 3 TIMES  DAILY (Patient taking differently: Take 100-200 mg by mouth See admin instructions. Take 1 capsule (100 mg) by mouth in the morning, take 1 capsule (100 mg) by  mouth midday & take 2 capsules (200 mg) by mouth at bedtime.) 270 capsule 3   glipiZIDE (GLUCOTROL XL) 2.5 MG 24 hr tablet Take 1 tablet (2.5 mg total) by mouth daily with supper. (Patient taking differently: Take 2.5 mg by mouth daily as needed (with sugar consumption).) 30 tablet 3   ibuprofen (ADVIL) 200 MG tablet Take 400-600 mg by mouth every 8 (eight) hours as needed (pain.).     lidocaine (LIDO KING) 4 % Place 1 patch onto the skin daily as needed (pain.).     loratadine (CLARITIN) 10 MG tablet Take 10 mg by mouth in the morning.     Omega-3 Fatty Acids (FISH OIL) 1200 MG CAPS Take 1,200 mg by mouth in the morning.     Probiotic Product (DIGESTIVE ADVANTAGE PO) Take 1 capsule by mouth in the morning.     rizatriptan (MAXALT) 10 MG tablet Take 1 tablet (10 mg total) by mouth as needed for migraine. May repeat in 2 hours if needed (Patient taking differently: Take 10 mg by mouth daily as needed for migraine. May repeat in 2 hours if needed) 12 tablet 3   rosuvastatin (CRESTOR) 10 MG tablet TAKE 1 TABLET BY MOUTH DAILY 90 tablet 1  simethicone (MYLICON) 125 MG chewable tablet Chew 125-250 mg by mouth at bedtime as needed for flatulence.     Sodium Chloride-Sodium Bicarb (AYR SALINE NASAL RINSE NA) Place into the nose every other day.     Soft Lens Products (SENSITIVE EYES SALINE) SOLN by Does not apply route.     traMADol (ULTRAM) 50 MG tablet Take 1 tablet (50 mg total) by mouth every 6 (six) hours as needed (mild pain). 20 tablet 0   No current facility-administered medications on file prior to visit.   Allergies  Allergen Reactions   Mushroom Ext Cmplx(Shiitake-Reishi-Mait) Anaphylaxis   Shellfish Allergy Anaphylaxis   Sulfonamide Derivatives Anaphylaxis   Vancomycin Anaphylaxis   Fluticasone-Salmeterol Other (See Comments)    Feels like something in the throat   Butalbital-Aspirin-Caffeine Other (See Comments)    UNSPECIFIED REACTION    Keflex [Cephalexin]     Nausea/vomitting    Montelukast Sodium Other (See Comments)    Feels like she's running out of breath   Penicillins     UNSPECIFIED REACTION      Childhood allergy Has patient had a PCN reaction causing immediate rash, facial/tongue/throat swelling, SOB or lightheadedness with hypotension: Unknown Has patient had a PCN reaction causing severe rash involving mucus membranes or skin necrosis: Unknown Has patient had a PCN reaction that required hospitalization: No Has patient had a PCN reaction occurring within the last 10 years: No If all of the above answers are "NO", then may proceed with Cephalosporin use.     Acetaminophen Other (See Comments)    Stomach discomfort   Clarithromycin Nausea And Vomiting   Iodinated Contrast Media Other (See Comments)    Bad headache; no prep needed   Levofloxacin Diarrhea and Nausea And Vomiting    Dizziness Believes it was due to a high dosage of this medication   Orphenadrine Nausea And Vomiting   Family History  Problem Relation Age of Onset   Stomach cancer Father    Coronary artery disease Father    Breast cancer Sister 24       breast cancer   Diabetes Maternal Aunt    Diabetes Paternal Aunt    Prostate cancer Brother    Cancer Daughter    Stroke Neg Hx    PE: BP 130/76   Pulse 86   Ht 5' (1.524 m)   Wt 282 lb (127.9 kg)   SpO2 96%   BMI 55.07 kg/m  Wt Readings from Last 3 Encounters:  05/27/23 282 lb (127.9 kg)  04/18/23 277 lb 3.2 oz (125.7 kg)  04/05/23 266 lb 3.2 oz (120.7 kg)   Constitutional: overweight, in NAD, + using a walker Eyes: EOMI, no exophthalmos ENT:  no thyromegaly, no cervical lymphadenopathy Cardiovascular: RRR, No MRG,+ LE edema B Respiratory: CTA B Musculoskeletal: no deformities Skin: + stasis dermatitis bilateral legs -wears compression hoses  Neurological: no tremor with outstretched hands  ASSESSMENT: 1. DM2, non-insulin-dependent, now more controlled, with complications - PN  2. HL  3.  Obesity class  III  PLAN:  1. Patient with controlled DM2, with multiple med intolerances but overall reasonable control. -Diabetic regimen is limited by previous side effects and preferences: Metformin IR, and ER - she was taking these inconsistently due to diarrhea and fecal incontinence low-dose glipizide XL - she felt that this was also causing her GI symptoms and stopped it.  SGLT2 inhibitors were too expensive in the past.  I sent a new prescription for these to her pharmacy in 08/2020,  but she was not able to start them due to price. Daughter called the insurance and SGLT2 inhibitors were not covered. She refused injectables including insulin and GLP-1 receptor agonist. Actos is not ideal for her since this could cause more swelling. -we have her on Farxiga through PAP pgm. She tolerates this well. Also, low dose sulfonylurea.  -at last OV, HbA1c was 6.4%, improved, but since then she had a 7.2% HbA1c before her hernia repair sx in 03/2023. -At today's visit, sugars appear to be elevated in the morning, above target and they remain approximately stable throughout the rest of the day.  We discussed about continuing the dose of Farxiga but I also advised her to take the glipizide ER 2.5 mg in the morning before the first meal of the day, along with [place].  She can continue to crush the glipizide tablet before a larger dinner. - I suggested to:  Patient Instructions  Please continue: - Farxiga 10 mg before b'fast  Please take: - Glipizide ER 2.5 mg before b'fast and also as needed (crushed) before a larger meal  Please return in 4-6 months with your sugar log.   - we checked her HbA1c: 7.1% (slightly better) - advised to check sugars at different times of the day - 1x a day, rotating check times - advised for yearly eye exams >> she is UTD - return to clinic in 4-6 months  2.  HL -latest lipid panel reviewed from 09/2022: TG elevated: Lab Results  Component Value Date   CHOL 182 09/08/2022    HDL 55.70 09/08/2022   LDLCALC 89 09/08/2022   LDLDIRECT 92.0 09/05/2020   TRIG 189.0 (H) 09/08/2022   CHOLHDL 3 09/08/2022  - on Crestor 10 mg daily and fish oil 1000 mg daily - tolerated well  3.  Obesity class III -She continues on Farxiga which should also help with weight loss -she gained 2 lbs before last OV and lost 3 lbs net since then  Carlus Pavlov, MD PhD [facility] Endocrinology

## 2023-05-27 NOTE — Patient Instructions (Signed)
Please continue: - Farxiga 10 mg before b'fast  Please take: - Glipizide ER 2.5 mg before b'fast and also as needed (crushed) before a larger meal  Please return in 4-6 months with your sugar log.

## 2023-05-30 LAB — POCT GLYCOSYLATED HEMOGLOBIN (HGB A1C): Hemoglobin A1C: 7.1 % — AB (ref 4.0–5.6)

## 2023-05-30 NOTE — Addendum Note (Signed)
Addended by: Pollie Meyer on: 05/30/2023 09:23 AM   Modules accepted: Orders

## 2023-05-31 ENCOUNTER — Encounter: Payer: Self-pay | Admitting: Internal Medicine

## 2023-05-31 MED ORDER — GLIPIZIDE ER 2.5 MG PO TB24: 2.5000 mg | ORAL_TABLET | Freq: Two times a day (BID) | ORAL | 3 refills | Status: DC

## 2023-06-21 ENCOUNTER — Encounter: Payer: Self-pay | Admitting: Anesthesiology

## 2023-06-22 ENCOUNTER — Telehealth (INDEPENDENT_AMBULATORY_CARE_PROVIDER_SITE_OTHER): Payer: Medicare Other | Admitting: Adult Health

## 2023-06-22 ENCOUNTER — Encounter: Payer: Self-pay | Admitting: Adult Health

## 2023-06-22 DIAGNOSIS — G4733 Obstructive sleep apnea (adult) (pediatric): Secondary | ICD-10-CM

## 2023-06-22 NOTE — Progress Notes (Signed)
[facility] [address] [place]. [place] [address] [phone]       OFFICE FOLLOW UP NOTE  [name] Date of Birth:  [birth year] Medical Record Number:  [id number]   Reason for visit: Initial CPAP follow-up  Virtual Visit via Video Note  I connected with Nicole Mccann on 06/22/23 at  3:45 PM EDT by a video enabled telemedicine application and verified that I am speaking with the correct person using two identifiers.  Location: Patient: at home Provider: in office   I discussed the limitations of evaluation and management by telemedicine and the availability of in person appointments. The patient expressed understanding and agreed to proceed.    SUBJECTIVE:    HPI:   Nicole Mccann is a 77 y.o. female who is being followed in this office for OSA on CPAP.  Initially seen by Dr. Vickey Huger on 08/10/2022 with prior history of sleep apnea on CPAP machine no longer working appropriately.  Completed HST 11/7 which showed severe OSA with total AHI of 37.7/h.  Received new CPAP on 11/15.   Interval history:   Patient reports doing well on CPAP.  Tolerating well.  Daytime energy levels good. Sleeping good. Does have leaks but not bothersome, will reposition mask and will improve.  Routinely followed by D[facility].  No questions or concerns today.               ROS:   14 system review of systems performed and negative with exception of those listed in HPI  PMH:  Past Medical History:  Diagnosis Date   Adjustment disorder with mixed anxiety and depressed mood 03/14/2015   Dr Madaline Guthrie actively treating [place]    ALLERGIC RHINITIS 04/04/2009   Qualifier: Diagnosis of  By: Alwyn Ren MD, Chrissie Noa     Allergy    Anxiety    Dr Madaline Guthrie   Arthritis    Asthma    ASTHMA 04/04/2009   Qualifier: Diagnosis of  By: Alwyn Ren MD, [name]   Status Asthmaticus 10/2004     Atypical seizure (HCC) 06/07/2012   BENIGN POSITIONAL VERTIGO  11/06/2009   Qualifier: Diagnosis of  By: Linford Arnold MD, Catherine     Complication of anesthesia    Depression    Dr Madaline Guthrie   Diabetes mellitus    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 08/17/13 seen in the emergency room; imaging negative. No labs. Referred to Dr. Pearletha Forge who recommended nonsteroidals and elevation. 10/18-11/3/14 in [place] for land and river cruise trip.  08/29/13 seen in ER in [place]; topical antibiotics & oral Ciprobay Rxed. 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 Xarelto 15 mg initiated 06/05/12 by Dr Jeraldine Loots , [facility] and increased to one pill twice a day. Despite this she had persistent thrombosis in the cephalic vein in the elbow and the upper forearm areas prompting change to Lovenox 110 mg e   Pulmonary embolism (HCC) 07/09/2012   Presumed based on intermediate VQ 07/07/2012 and high clinical probability.  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 12/17/12 Note: I recommended discussion with [name],Neurologist; & Dr Margaretmary Bayley, Diabetologist    Type 2 diabetes, controlled, with peripheral neuropathy (HCC) 04/04/2009   Qualifier: Diagnosis of  By: Alwyn Ren MD, Chrissie Noa   Dr Margaretmary Bayley, Endo Dr Emily Filbert , Ophth seen anually     Variants of migraine, not elsewhere classified, without mention of intractable migraine without mention of status migrainosus 05/22/2013    PSH:  Past Surgical History:  Procedure Laterality Date   APPENDECTOMY     ARTERY BIOPSY Left 04/14/2018   Procedure: BIOPSY TEMPORAL ARTERY;  Surgeon: Sherren Kerns, MD;  Location: [facility] OR;  Service: Vascular;  Laterality: Left;   BREAST EXCISIONAL BIOPSY Right 1998   CHOLECYSTECTOMY     COLONOSCOPY  2006   [facility]   INSERTION OF MESH N/A 04/05/2023   Procedure: INSERTION OF MESH;  Surgeon: Manus Rudd, MD;  Location: [facility] OR;  Service: General;  Laterality: N/A;   KNEE SURGERY Left    rotator cuff surgery     TONSILLECTOMY      UMBILICAL HERNIA REPAIR N/A 04/05/2023   Procedure: HERNIA REPAIR UMBILICAL ADULT WITH MESH;  Surgeon: Manus Rudd, MD;  Location: [facility] OR;  Service: General;  Laterality: N/A;    Social History:  Social History   Socioeconomic History   Marital status: Married    Spouse name: Lars Mage   Number of children: 2   Years of education: [employer] education level: Bachelor's degree (e.g., BA, AB, BS)  Occupational History   Not on file  Tobacco Use   Smoking status: Never   Smokeless tobacco: Never  Vaping Use   Vaping status: Never Used  Substance and Sexual Activity   Alcohol use: Yes    Alcohol/week: 3.0 standard drinks of alcohol    Types:  3 Glasses of wine per week    Comment: 1/2 glass wine daily   Drug use: No   Sexual activity: Never  Other Topics Concern   Not on file  Social History Narrative   Patient is married Lars Mage) and lives at home with her husband.   Patient has two adult children.   Patient is retired.   Patient has a college education.   Patient is right-handed.   Patient drinks one cup of tea daily.   Social Determinants of Health   Financial Resource Strain: Low Risk  (02/22/2023)   Overall Financial Resource Strain (CARDIA)    Difficulty of Paying Living Expenses: Not very hard  Food Insecurity: No Food Insecurity (04/05/2023)  Hunger Vital Sign    Worried About Running Out of Food in the Last Year: Never true    Ran Out of Food in the Last Year: Never true  Transportation Needs: Unknown (04/05/2023)   PRAPARE - Administrator, Civil Service (Medical): Patient declined    Lack of Transportation (Non-Medical): No  Physical Activity: Unknown (02/22/2023)   Exercise Vital Sign    Days of Exercise per Week: 0 days    Minutes of Exercise per Session: Not on file  Stress: Stress Concern Present (02/22/2023)   [employer] of Occupational Health - Occupational Stress Questionnaire    Feeling of Stress : Rather much  Social Connections: Socially Isolated (02/22/2023)   Social Connection and Isolation Panel [NHANES]    Frequency of Communication with Friends and Family: Once a week    Frequency of Social Gatherings with Friends and Family: Once a week    Attends Religious Services: Never    Database administrator or Organizations: No    Attends Engineer, structural: Not on file    Marital Status: Married  Catering manager Violence: Not At Risk (04/05/2023)   Humiliation, Afraid, Rape, and Kick questionnaire    Fear of Current or Ex-Partner: No    Emotionally Abused: No    Physically Abused: No    Sexually Abused: No    Family History:  Family History  Problem Relation Age  of Onset   Stomach cancer Father    Coronary artery disease Father    Breast cancer Sister 42       breast cancer   Diabetes Maternal Aunt    Diabetes Paternal Aunt    Prostate cancer Brother    Cancer Daughter    Stroke Neg Hx     Medications:   Current Outpatient Medications on File Prior to Visit  Medication Sig Dispense Refill   albuterol (VENTOLIN HFA) 108 (90 Base) MCG/ACT inhaler Inhale 2 puffs into the lungs every 6 (six) hours as needed for wheezing or shortness of breath. 18 g 6   bacitracin ointment Apply 1 Application topically 2 (two) times daily. (Patient taking differently: Apply 1 Application topically 2 (two) times daily as needed for wound care.) 120 g 0   budesonide-formoterol (SYMBICORT) 160-4.5 MCG/ACT inhaler Inhale 2 puffs into the lungs 2 (two) times daily. (Patient taking differently: Inhale 2 puffs into the lungs in the morning.) 1 each 12   Calcium Carb-Cholecalciferol (CALCIUM 600+D3 PO) Take 1 tablet by mouth in the morning.     carboxymethylcellulose (REFRESH TEARS) 0.5 % SOLN Place 1-2 drops into both eyes 3 (three) times daily as needed (dry/irritated eyes.).     cetirizine (ZYRTEC) 10 MG tablet Take 10 mg by mouth at bedtime.     Cholecalciferol (VITAMIN D) 50 MCG (2000 UT) CAPS Take 2,000 Units by mouth in the morning.     dapagliflozin propanediol (FARXIGA) 10 MG TABS tablet Take 1 tablet (10 mg total) by mouth daily before breakfast. 90 tablet 3   diclofenac Sodium (VOLTAREN ARTHRITIS PAIN) 1 % GEL Apply 1 Application topically 4 (four) times daily as needed (pain.).     FLUoxetine (PROZAC) 20 MG capsule TAKE 1 CAPSULE BY MOUTH DAILY (Patient taking differently: Take 20 mg by mouth every evening.) 90 capsule 3   furosemide (LASIX) 20 MG tablet Take 1-2 tablets (20-40 mg total) by mouth daily. Use as needed for lower extremity swelling 40 tablet 3   gabapentin (NEURONTIN) 100 MG capsule TAKE  1 CAPSULE BY MOUTH 3 TIMES  DAILY (Patient taking differently:  Take 100-200 mg by mouth See admin instructions. Take 1 capsule (100 mg) by mouth in the morning, take 1 capsule (100 mg) by mouth midday & take 2 capsules (200 mg) by mouth at bedtime.) 270 capsule 3   glipiZIDE (GLUCOTROL XL) 2.5 MG 24 hr tablet Take 1 tablet (2.5 mg total) by mouth 2 (two) times daily before a meal. 180 tablet 3   ibuprofen (ADVIL) 200 MG tablet Take 400-600 mg by mouth every 8 (eight) hours as needed (pain.).     lidocaine (LIDO KING) 4 % Place 1 patch onto the skin daily as needed (pain.).     loratadine (CLARITIN) 10 MG tablet Take 10 mg by mouth in the morning.     Omega-3 Fatty Acids (FISH OIL) 1200 MG CAPS Take 1,200 mg by mouth in the morning.     Probiotic Product (DIGESTIVE ADVANTAGE PO) Take 1 capsule by mouth in the morning.     rizatriptan (MAXALT) 10 MG tablet Take 1 tablet (10 mg total) by mouth as needed for migraine. May repeat in 2 hours if needed (Patient taking differently: Take 10 mg by mouth daily as needed for migraine. May repeat in 2 hours if needed) 12 tablet 3   rosuvastatin (CRESTOR) 10 MG tablet TAKE 1 TABLET BY MOUTH DAILY 90 tablet 1   simethicone (MYLICON) 125 MG chewable tablet Chew 125-250 mg by mouth at bedtime as needed for flatulence.     Sodium Chloride-Sodium Bicarb (AYR SALINE NASAL RINSE NA) Place into the nose every other day.     No current facility-administered medications on file prior to visit.    Allergies:   Allergies  Allergen Reactions   Mushroom Ext Cmplx(Shiitake-Reishi-Mait) Anaphylaxis   Shellfish Allergy Anaphylaxis   Sulfonamide Derivatives Anaphylaxis   Vancomycin Anaphylaxis   Fluticasone-Salmeterol Other (See Comments)    Feels like something in the throat   Butalbital-Aspirin-Caffeine Other (See Comments)    UNSPECIFIED REACTION    Keflex [Cephalexin]     Nausea/vomitting   Montelukast Sodium Other (See Comments)    Feels like she's running out of breath   Penicillins     UNSPECIFIED REACTION      Childhood  allergy Has patient had a PCN reaction causing immediate rash, facial/tongue/throat swelling, SOB or lightheadedness with hypotension: Unknown Has patient had a PCN reaction causing severe rash involving mucus membranes or skin necrosis: Unknown Has patient had a PCN reaction that required hospitalization: No Has patient had a PCN reaction occurring within the last 10 years: No If all of the above answers are "NO", then may proceed with Cephalosporin use.     Acetaminophen Other (See Comments)    Stomach discomfort   Clarithromycin Nausea And Vomiting   Iodinated Contrast Media Other (See Comments)    Bad headache; no prep needed   Levofloxacin Diarrhea and Nausea And Vomiting    Dizziness Believes it was due to a high dosage of this medication   Orphenadrine Nausea And Vomiting      OBJECTIVE:  Physical Exam N/A d/t visit type        ASSESSMENT/PLAN: Nicole Mccann is a 77 y.o. year old female    OSA on CPAP : Compliance report shows satisfactory usage with optimal residual AHI.  Continue current pressure settings.  Discussed continued nightly usage with ensuring greater than 4 hours nightly for optimal benefit and per insurance purposes.  Continue to follow with DME  company for any needed supplies or CPAP related concerns     Follow up in 1 year or call earlier if needed   CC:  PCP: Copland, Gwenlyn Found, MD    I spent 16 minutes of face-to-face and non-face-to-face time with patient.  This included previsit chart review, lab review, study review, order entry, electronic health record documentation, patient education regarding diagnosis of sleep apnea with review and discussion of compliance report and answered all other questions to patient's satisfaction  Ihor Austin, [facility]  [facility] [address] [place], [place] [address]  Phone [phone] Fax [phone] Note: This document was prepared with digital dictation  and possible smart phrase technology. Any transcriptional errors that result from this process are unintentional.

## 2023-06-23 ENCOUNTER — Ambulatory Visit (INDEPENDENT_AMBULATORY_CARE_PROVIDER_SITE_OTHER): Payer: Medicare Other | Admitting: *Deleted

## 2023-06-23 VITALS — BP 115/58 | HR 71

## 2023-06-23 DIAGNOSIS — Z0000 Encounter for general adult medical examination without abnormal findings: Secondary | ICD-10-CM

## 2023-06-23 NOTE — Patient Instructions (Signed)
Nicole Mccann , Thank you for taking time to come for your Medicare Wellness Visit. I appreciate your ongoing commitment to your health goals. Please review the following plan we discussed and let me know if I can assist you in the future.     This is a list of the screening recommended for you and due dates:  Health Maintenance  Topic Date Due   COVID-19 Vaccine (6 - 2023-24 season) 09/29/2022   Flu Shot  06/09/2023   Yearly kidney health urinalysis for diabetes  09/09/2023   Eye exam for diabetics  09/24/2023   Complete foot exam   11/27/2023   Hemoglobin A1C  11/30/2023   Yearly kidney function blood test for diabetes  03/31/2024   Medicare Annual Wellness Visit  06/22/2024   DTaP/Tdap/Td vaccine (4 - Td or Tdap) 12/18/2032   Pneumonia Vaccine  Completed   DEXA scan (bone density measurement)  Completed   Hepatitis C Screening  Completed   Zoster (Shingles) Vaccine  Completed   HPV Vaccine  Aged Out    Next appointment: Follow up in one year for your annual wellness visit.   Preventive Care 40 Years and Older, Female Preventive care refers to lifestyle choices and visits with your health care provider that can promote health and wellness. What does preventive care include? A yearly physical exam. This is also called an annual well check. Dental exams once or twice a year. Routine eye exams. Ask your health care provider how often you should have your eyes checked. Personal lifestyle choices, including: Daily care of your teeth and gums. Regular physical activity. Eating a healthy diet. Avoiding tobacco and drug use. Limiting alcohol use. Practicing safe sex. Taking low-dose aspirin every day. Taking vitamin and mineral supplements as recommended by your health care provider. What happens during an annual well check? The services and screenings done by your health care provider during your annual well check will depend on your age, overall health, lifestyle risk factors,  and family history of disease. Counseling  Your health care provider may ask you questions about your: Alcohol use. Tobacco use. Drug use. Emotional well-being. Home and relationship well-being. Sexual activity. Eating habits. History of falls. Memory and ability to understand (cognition). Work and work Astronomer. Reproductive health. Screening  You may have the following tests or measurements: Height, weight, and BMI. Blood pressure. Lipid and cholesterol levels. These may be checked every 5 years, or more frequently if you are over 49 years old. Skin check. Lung cancer screening. You may have this screening every year starting at age 48 if you have a 30-pack-year history of smoking and currently smoke or have quit within the past 15 years. Fecal occult blood test (FOBT) of the stool. You may have this test every year starting at age 59. Flexible sigmoidoscopy or colonoscopy. You may have a sigmoidoscopy every 5 years or a colonoscopy every 10 years starting at age 78. Hepatitis C blood test. Hepatitis B blood test. Sexually transmitted disease (STD) testing. Diabetes screening. This is done by checking your blood sugar (glucose) after you have not eaten for a while (fasting). You may have this done every 1-3 years. Bone density scan. This is done to screen for osteoporosis. You may have this done starting at age 23. Mammogram. This may be done every 1-2 years. Talk to your health care provider about how often you should have regular mammograms. Talk with your health care provider about your test results, treatment options, and if necessary, the  need for more tests. Vaccines  Your health care provider may recommend certain vaccines, such as: Influenza vaccine. This is recommended every year. Tetanus, diphtheria, and acellular pertussis (Tdap, Td) vaccine. You may need a Td booster every 10 years. Zoster vaccine. You may need this after age 9. Pneumococcal 13-valent conjugate  (PCV13) vaccine. One dose is recommended after age 10. Pneumococcal polysaccharide (PPSV23) vaccine. One dose is recommended after age 61. Talk to your health care provider about which screenings and vaccines you need and how often you need them. This information is not intended to replace advice given to you by your health care provider. Make sure you discuss any questions you have with your health care provider. Document Released: 11/21/2015 Document Revised: 07/14/2016 Document Reviewed: 08/26/2015 Elsevier Interactive Patient Education  2017 [employer].  Fall Prevention in the Home Falls can cause injuries. They can happen to people of all ages. There are many things you can do to make your home safe and to help prevent falls. What can I do on the outside of my home? Regularly fix the edges of walkways and driveways and fix any cracks. Remove anything that might make you trip as you walk through a door, such as a raised step or threshold. Trim any bushes or trees on the path to your home. Use bright outdoor lighting. Clear any walking paths of anything that might make someone trip, such as rocks or tools. Regularly check to see if handrails are loose or broken. Make sure that both sides of any steps have handrails. Any raised decks and porches should have guardrails on the edges. Have any leaves, snow, or ice cleared regularly. Use sand or salt on walking paths during winter. Clean up any spills in your garage right away. This includes oil or grease spills. What can I do in the bathroom? Use night lights. Install grab bars by the toilet and in the tub and shower. Do not use towel bars as grab bars. Use non-skid mats or decals in the tub or shower. If you need to sit down in the shower, use a plastic, non-slip stool. Keep the floor dry. Clean up any water that spills on the floor as soon as it happens. Remove soap buildup in the tub or shower regularly. Attach bath mats securely with  double-sided non-slip rug tape. Do not have throw rugs and other things on the floor that can make you trip. What can I do in the bedroom? Use night lights. Make sure that you have a light by your bed that is easy to reach. Do not use any sheets or blankets that are too big for your bed. They should not hang down onto the floor. Have a firm chair that has side arms. You can use this for support while you get dressed. Do not have throw rugs and other things on the floor that can make you trip. What can I do in the kitchen? Clean up any spills right away. Avoid walking on wet floors. Keep items that you use a lot in easy-to-reach places. If you need to reach something above you, use a strong step stool that has a grab bar. Keep electrical cords out of the way. Do not use floor polish or wax that makes floors slippery. If you must use wax, use non-skid floor wax. Do not have throw rugs and other things on the floor that can make you trip. What can I do with my stairs? Do not leave any items on the  stairs. Make sure that there are handrails on both sides of the stairs and use them. Fix handrails that are broken or loose. Make sure that handrails are as long as the stairways. Check any carpeting to make sure that it is firmly attached to the stairs. Fix any carpet that is loose or worn. Avoid having throw rugs at the top or bottom of the stairs. If you do have throw rugs, attach them to the floor with carpet tape. Make sure that you have a light switch at the top of the stairs and the bottom of the stairs. If you do not have them, ask someone to add them for you. What else can I do to help prevent falls? Wear shoes that: Do not have high heels. Have rubber bottoms. Are comfortable and fit you well. Are closed at the toe. Do not wear sandals. If you use a stepladder: Make sure that it is fully opened. Do not climb a closed stepladder. Make sure that both sides of the stepladder are locked  into place. Ask someone to hold it for you, if possible. Clearly mark and make sure that you can see: Any grab bars or handrails. First and last steps. Where the edge of each step is. Use tools that help you move around (mobility aids) if they are needed. These include: Canes. Walkers. Scooters. Crutches. Turn on the lights when you go into a dark area. Replace any light bulbs as soon as they burn out. Set up your furniture so you have a clear path. Avoid moving your furniture around. If any of your floors are uneven, fix them. If there are any pets around you, be aware of where they are. Review your medicines with your doctor. Some medicines can make you feel dizzy. This can increase your chance of falling. Ask your doctor what other things that you can do to help prevent falls. This information is not intended to replace advice given to you by your health care provider. Make sure you discuss any questions you have with your health care provider. Document Released: 08/21/2009 Document Revised: 04/01/2016 Document Reviewed: 11/29/2014 Elsevier Interactive Patient Education  2017 [employer].

## 2023-06-23 NOTE — Progress Notes (Signed)
Subjective:   Nicole Mccann is a 77 y.o. female who presents for Medicare Annual (Subsequent) preventive examination.  Visit Complete: Virtual  I connected with  [name] on 06/23/23 by a audio enabled telemedicine application and verified that I am speaking with the correct person using two identifiers.  Patient Location: Home  Provider Location: Office/Clinic  I discussed the limitations of evaluation and management by telemedicine. The patient expressed understanding and agreed to proceed.  Patient Medicare AWV questionnaire was completed by the patient on 06/23/23; I have confirmed that all information answered by patient is correct and no changes since this date.  Review of Systems     Cardiac Risk Factors include: advanced age (>55men, >63 women);diabetes mellitus;dyslipidemia;obesity (BMI >30kg/m2)     Objective:   Pt reported vitals. Today's Vitals   06/23/23 1542  BP: (!) 115/58  Pulse: 71   There is no height or weight on file to calculate BMI.     06/23/2023    3:48 PM 04/05/2023   11:17 AM 04/01/2023    8:31 AM 06/14/2022    3:48 PM 06/09/2021    3:52 PM 07/07/2020    4:31 PM 05/22/2020    1:12 PM  Advanced Directives  Does Patient Have a Medical Advance Directive? Yes Yes Yes Yes Yes Yes Yes  Type of Estate agent of [place];Living will Healthcare Power of [place];Living will Healthcare Power of [place];Living will Healthcare Power of [place];Living will Healthcare Power of [place];Living will  Healthcare Power of [place];Living will  Does patient want to make changes to medical advance directive?  No - Patient declined No - Patient declined No - Patient declined  No - Patient declined No - Patient declined  Copy of Healthcare Power of Attorney in Chart? No - copy requested No - copy requested  No - copy requested No - copy requested  No - copy requested    Current Medications (verified) Outpatient Encounter Medications as  of 06/23/2023  Medication Sig   albuterol (VENTOLIN HFA) 108 (90 Base) MCG/ACT inhaler Inhale 2 puffs into the lungs every 6 (six) hours as needed for wheezing or shortness of breath.   bacitracin ointment Apply 1 Application topically 2 (two) times daily. (Patient taking differently: Apply 1 Application topically 2 (two) times daily as needed for wound care.)   budesonide-formoterol (SYMBICORT) 160-4.5 MCG/ACT inhaler Inhale 2 puffs into the lungs 2 (two) times daily. (Patient taking differently: Inhale 2 puffs into the lungs in the morning.)   Calcium Carb-Cholecalciferol (CALCIUM 600+D3 PO) Take 1 tablet by mouth in the morning.   carboxymethylcellulose (REFRESH TEARS) 0.5 % SOLN Place 1-2 drops into both eyes 3 (three) times daily as needed (dry/irritated eyes.).   cetirizine (ZYRTEC) 10 MG tablet Take 10 mg by mouth at bedtime.   Cholecalciferol (VITAMIN D) 50 MCG (2000 UT) CAPS Take 2,000 Units by mouth in the morning.   dapagliflozin propanediol (FARXIGA) 10 MG TABS tablet Take 1 tablet (10 mg total) by mouth daily before breakfast.   diclofenac Sodium (VOLTAREN ARTHRITIS PAIN) 1 % GEL Apply 1 Application topically 4 (four) times daily as needed (pain.).   FLUoxetine (PROZAC) 20 MG capsule TAKE 1 CAPSULE BY MOUTH DAILY (Patient taking differently: Take 20 mg by mouth every evening.)   furosemide (LASIX) 20 MG tablet Take 1-2 tablets (20-40 mg total) by mouth daily. Use as needed for lower extremity swelling   gabapentin (NEURONTIN) 100 MG capsule TAKE 1 CAPSULE BY MOUTH 3 TIMES  DAILY (Patient taking differently: Take 100-200 mg by mouth See admin instructions. Take 1 capsule (100 mg) by mouth in the morning, take 1 capsule (100 mg) by mouth midday & take 2 capsules (200 mg) by mouth at bedtime.)   glipiZIDE (GLUCOTROL XL) 2.5 MG 24 hr tablet Take 1 tablet (2.5 mg total) by mouth 2 (two) times daily before a meal.   ibuprofen (ADVIL) 200 MG tablet Take 400-600 mg by mouth every 8 (eight) hours  as needed (pain.).   lidocaine (LIDO KING) 4 % Place 1 patch onto the skin daily as needed (pain.).   loratadine (CLARITIN) 10 MG tablet Take 10 mg by mouth in the morning.   Omega-3 Fatty Acids (FISH OIL) 1200 MG CAPS Take 1,200 mg by mouth in the morning.   Probiotic Product (DIGESTIVE ADVANTAGE PO) Take 1 capsule by mouth in the morning.   rizatriptan (MAXALT) 10 MG tablet Take 1 tablet (10 mg total) by mouth as needed for migraine. May repeat in 2 hours if needed (Patient taking differently: Take 10 mg by mouth daily as needed for migraine. May repeat in 2 hours if needed)   rosuvastatin (CRESTOR) 10 MG tablet TAKE 1 TABLET BY MOUTH DAILY   simethicone (MYLICON) 125 MG chewable tablet Chew 125-250 mg by mouth at bedtime as needed for flatulence.   Sodium Chloride-Sodium Bicarb (AYR SALINE NASAL RINSE NA) Place into the nose every other day.   No facility-administered encounter medications on file as of 06/23/2023.    Allergies (verified) Mushroom ext cmplx(shiitake-reishi-mait), Shellfish allergy, Sulfonamide derivatives, Vancomycin, Fluticasone-salmeterol, Butalbital-aspirin-caffeine, Keflex [cephalexin], Montelukast sodium, Penicillins, Acetaminophen, Clarithromycin, Iodinated contrast media, Levofloxacin, and Orphenadrine   History: Past Medical History:  Diagnosis Date   Adjustment disorder with mixed anxiety and depressed mood 03/14/2015   Dr Madaline Guthrie actively treating [place]    ALLERGIC RHINITIS 04/04/2009   Qualifier: Diagnosis of  By: Alwyn Ren MD, Chrissie Noa     Allergy    Anxiety    Dr Madaline Guthrie   Arthritis    Asthma    ASTHMA 04/04/2009   Qualifier: Diagnosis of  By: Alwyn Ren MD, [name]   Status Asthmaticus 10/2004     Atypical seizure (HCC) 06/07/2012   BENIGN POSITIONAL VERTIGO 11/06/2009   Qualifier: Diagnosis of  By: Linford Arnold MD, Santina Evans     Cataract    Complication of anesthesia    Depression    Dr Madaline Guthrie   Diabetes mellitus    Disturbances of sensation of smell and  taste 05/22/2013   Diverticulosis of colon 04/04/2009   Qualifier: Diagnosis of  By: Alwyn Ren MD, [name]     Dizziness and giddiness 12/22/2016   DVT (deep venous thrombosis) (HCC)    Dyspnea    Headache(784.0)    Hyperlipidemia    Hypersomnia with sleep apnea, unspecified 05/22/2014   Injury of left knee 08/22/2013   08[place]15[place]13 [place] contusion and abrasion of left knee. 08/17/13 seen in the emergency room; imaging negative. No labs. Referred to Dr. Pearletha Forge who recommended nonsteroidals and elevation. 10/18-11/3/14 in [place] for land and river cruise trip.  08/29/13 seen in ER in [place]; topical antibiotics & oral Ciprobay Rxed. 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 Xarelto 15 mg initiated 06/05/12 by Dr Jeraldine Loots , [facility] and increased to one pill twice a day. Despite this she had persistent thrombosis in the cephalic vein in the elbow and the upper forearm areas prompting change to Lovenox 110 mg e   Pulmonary embolism (HCC) 07/09/2012   Presumed based on intermediate VQ 07/07/2012 and high clinical probability.  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 12/17/12 Note: I recommended discussion with [name],Neurologist; & Dr Margaretmary Bayley, Diabetologist    Type 2 diabetes, controlled, with peripheral neuropathy (HCC) 04/04/2009   Qualifier: Diagnosis of  By: Alwyn Ren MD, Chrissie Noa   Dr Margaretmary Bayley, Endo Dr Emily Filbert , Ophth seen anually     Variants of migraine, not elsewhere classified, without mention of intractable migraine without mention of status migrainosus 05/22/2013   Past Surgical History:  Procedure Laterality Date   APPENDECTOMY     ARTERY BIOPSY Left 04/14/2018   Procedure: BIOPSY TEMPORAL ARTERY;  Surgeon: Sherren Kerns, MD;  Location: [facility] OR;  Service: Vascular;  Laterality: Left;   BREAST EXCISIONAL BIOPSY Right 1998   CHOLECYSTECTOMY     COLONOSCOPY  2006   [facility]   INSERTION OF MESH N/A 04/05/2023   Procedure: INSERTION OF MESH;  Surgeon: Manus Rudd, MD;  Location: [facility] OR;  Service: General;  Laterality: N/A;   KNEE SURGERY Left    rotator cuff surgery     TONSILLECTOMY      UMBILICAL HERNIA REPAIR N/A 04/05/2023   Procedure: HERNIA REPAIR UMBILICAL ADULT WITH MESH;  Surgeon: Manus Rudd, MD;  Location: [facility] OR;  Service: General;  Laterality: N/A;   Family History  Problem Relation Age of Onset   Stomach cancer Father    Coronary artery disease Father    Cancer Father    Obesity Father    Breast cancer Sister 68       breast cancer   Cancer Sister    Diabetes Maternal Aunt    Diabetes Paternal Aunt    Prostate cancer Brother    Cancer Brother    Obesity Brother    Cancer Daughter    Diabetes Daughter    Obesity Daughter    Obesity Daughter    Stroke Neg Hx    Social History   Socioeconomic History   Marital status: Married    Spouse name: Lars Mage   Number of children: 2   Years of education: College   Highest education level: Bachelor's degree (e.g., BA, AB, BS)  Occupational History    Not on file  Tobacco Use   Smoking status: Never   Smokeless tobacco: Never  Vaping Use   Vaping status: Never Used  Substance and Sexual Activity   Alcohol use: Yes    Alcohol/week: 3.0 standard drinks of alcohol  Types: 3 Glasses of wine per week    Comment: 1/2 glass wine daily   Drug use: No   Sexual activity: Never  Other Topics Concern   Not on file  Social History Narrative   Patient is married Lars Mage) and lives at home with her husband.   Patient has two adult children.   Patient is retired.   Patient has a college education.   Patient is right-handed.   Patient drinks one cup of tea daily.   Social Determinants of Health   Financial Resource Strain: Low Risk  (06/23/2023)   Overall Financial Resource Strain (CARDIA)    Difficulty of Paying Living Expenses: Not very hard  Food Insecurity: No Food Insecurity (06/23/2023)   Hunger Vital Sign    Worried About Running Out of Food in the Last Year: Never true    Ran Out of Food in the Last Year: Never true  Transportation Needs: No Transportation Needs (06/23/2023)   PRAPARE - Administrator, Civil Service (Medical): No    Lack of Transportation (Non-Medical): No  Physical Activity: Inactive (06/23/2023)   Exercise Vital Sign    Days of Exercise per Week: 0 days    Minutes of Exercise per Session: 0 min  Stress: No Stress Concern Present (06/23/2023)   [employer] of Occupational Health - Occupational Stress Questionnaire    Feeling of Stress : Only a little  Social Connections: Moderately Isolated (06/23/2023)   Social Connection and Isolation Panel [NHANES]    Frequency of Communication with Friends and Family: Never    Frequency of Social Gatherings with Friends and Family: Once a week    Attends Religious Services: Never    Database administrator or Organizations: Yes    Attends Banker Meetings: Never    Marital Status: Married    Tobacco Counseling Counseling given: Not  Answered   Clinical Intake:  Pre-visit preparation completed: Yes  Pain : No/denies pain  Nutritional Risks: None Diabetes: Yes CBG done?: No Did pt. bring in CBG monitor from home?: No  How often do you need to have someone help you when you read instructions, pamphlets, or other written materials from your doctor or pharmacy?: 4 - Often  Interpreter Needed?: No  Information entered by :: [employer]   Activities of Daily Living    06/23/2023    3:00 PM 04/05/2023   11:17 AM  In your present state of health, do you have any difficulty performing the following activities:  Hearing? 0 0  Vision? 0 1  Difficulty concentrating or making decisions? 0 0  Walking or climbing stairs? 1 1  Dressing or bathing? 0 0  Doing errands, shopping? 1 0  Comment daughter assists   Preparing Food and eating ? N   Using the Toilet? N   In the past six months, have you accidently leaked urine? Y   Do you have problems with loss of bowel control? N   Managing your Medications? N   Managing your Finances? Y   Housekeeping or managing your Housekeeping? Y   Comment daughter assists     Patient Care Team: Copland, Gwenlyn Found, MD as PCP - General (Family Medicine) Thomasene Ripple, DO as PCP - Cardiology (Cardiology) Dohmeier, Porfirio Mylar, MD as Consulting Physician (Neurology) Carlus Pavlov, MD as Consulting Physician (Internal Medicine)  Indicate any recent Medical Services you may have received from other than [facility] providers in the past year (date may be approximate).  Assessment:   This is a routine wellness examination for [place].  Hearing/Vision screen No results found.  Dietary issues and exercise activities discussed:     Goals Addressed   None    Depression Screen    06/23/2023    3:47 PM 03/21/2023    4:05 PM 06/14/2022    3:49 PM 09/09/2021   11:07 AM 06/09/2021    3:57 PM 04/27/2021    4:07 PM 05/22/2020    1:21 PM  PHQ 2/9 Scores  PHQ - 2 Score 0 0 0 0 1 0 0   PHQ- 9 Score  0  0       Fall Risk    06/23/2023    3:00 PM 06/14/2022    3:49 PM 06/09/2021    3:55 PM 04/27/2021    4:07 PM 07/07/2020    4:31 PM  Fall Risk   Falls in the past year? 1 0 0 0 0  Number falls in past yr: 1 0 0 0   Injury with Fall? 1 0 0 0   Risk for fall due to : History of fall(s) No Fall Risks     Follow up Falls evaluation completed Falls evaluation completed Falls prevention discussed Falls evaluation completed     MEDICARE RISK AT HOME:   TIMED UP AND GO:  Was the test performed?  No    Cognitive Function:    06/23/2023    3:50 PM 01/03/2017    3:08 PM  MMSE - Mini Mental State Exam  Not completed: Unable to complete   Orientation to time  5  Orientation to Place  5  Registration  3  Attention/ Calculation  5  Recall  3  Language- name 2 objects  2  Language- repeat  1  Language- follow 3 step command  3  Language- read & follow direction  1  Write a sentence  1  Copy design  1  Total score  30      06/03/2016    9:38 AM  Montreal Cognitive Assessment   Visuospatial/ Executive (0/5) 5  Naming (0/3) 3  Attention: Read list of digits (0/2) 2  Attention: Read list of letters (0/1) 1  Attention: Serial 7 subtraction starting at 100 (0/3) 3  Language: Repeat phrase (0/2) 1  Language : Fluency (0/1) 0  Abstraction (0/2) 2  Delayed Recall (0/5) 4  Orientation (0/6) 5  Total 26  Adjusted Score (based on education) 26      06/14/2022    4:02 PM  6CIT Screen  What Year? 0 points  What month? 0 points  What time? 0 points  Count back from 20 0 points  Months in reverse 4 points  Repeat phrase 2 points  Total Score 6 points    Immunizations Immunization History  Administered Date(s) Administered   Fluad Quad(high Dose 65+) 08/05/2020   Influenza Split 07/25/2012, 08/04/2022   Influenza Whole 09/27/2007, 08/21/2008, 09/17/2009   Influenza, High Dose Seasonal PF 09/05/2018   Influenza,inj,Quad PF,6+ Mos 07/23/2015, 08/08/2016    Influenza-Unspecified 09/02/2021   Moderna SARS-COV2 Booster Vaccination 02/11/2021, 02/20/2021   PFIZER(Purple Top)SARS-COV-2 Vaccination 01/01/2020, 01/22/2020, 08/11/2020   Pfizer Covid-19 Vaccine Bivalent Booster 66yrs & up 07/20/2021   Pneumococcal Conjugate-13 03/13/2015   Pneumococcal Polysaccharide-23 05/22/2020   Td 11/08/2001   Tdap 08/17/2013, 12/18/2022   Unspecified SARS-COV-2 Vaccination 08/04/2022   Zoster Recombinant(Shingrix) 01/14/2022, 04/02/2022    TDAP status: Up to date  Flu Vaccine status: Due, Education has been  provided regarding the importance of this vaccine. Advised may receive this vaccine at local pharmacy or Health Dept. Aware to provide a copy of the vaccination record if obtained from local pharmacy or Health Dept. Verbalized acceptance and understanding.  Pneumococcal vaccine status: Up to date  Covid-19 vaccine status: Information provided on how to obtain vaccines.   Qualifies for Shingles Vaccine? Yes   Zostavax completed No   Shingrix Completed?: Yes  Screening Tests Health Maintenance  Topic Date Due   COVID-19 Vaccine (6 - 2023-24 season) 09/29/2022   Medicare Annual Wellness (AWV)  06/15/2023   INFLUENZA VACCINE  06/09/2023   Diabetic kidney evaluation - Urine ACR  09/09/2023   OPHTHALMOLOGY EXAM  09/24/2023   FOOT EXAM  11/27/2023   HEMOGLOBIN A1C  11/30/2023   Diabetic kidney evaluation - eGFR measurement  03/31/2024   DTaP/Tdap/Td (4 - Td or Tdap) 12/18/2032   Pneumonia Vaccine 50+ Years old  Completed   DEXA SCAN  Completed   Hepatitis C Screening  Completed   Zoster Vaccines- Shingrix  Completed   HPV VACCINES  Aged Out    Health Maintenance  Health Maintenance Due  Topic Date Due   COVID-19 Vaccine (6 - 2023-24 season) 09/29/2022   Medicare Annual Wellness (AWV)  06/15/2023   INFLUENZA VACCINE  06/09/2023    Colorectal cancer screening: No longer required.   Mammogram status: Completed 02/18/23. Repeat every  year  Bone Density status: Completed 07/20/21. Results reflect: Bone density results: OSTEOPENIA. Repeat every 2 years.  Lung Cancer Screening: (Low Dose CT Chest recommended if Age 21-80 years, 20 pack-year currently smoking OR have quit w/in 15years.) does not qualify.   Additional Screening:  Hepatitis C Screening: does qualify; Completed 05/05/16  Vision Screening: Recommended annual ophthalmology exams for early detection of glaucoma and other disorders of the eye. Is the patient up to date with their annual eye exam?  Yes  Who is the provider or what is the name of the office in which the patient attends annual eye exams? [facility] If pt is not established with a provider, would they like to be referred to a provider to establish care? No .   Dental Screening: Recommended annual dental exams for proper oral hygiene  Diabetic Foot Exam: Diabetic Foot Exam: Completed 11/26/22  Community Resource Referral / Chronic Care Management: CRR required this visit?  No   CCM required this visit?  No     Plan:     I have personally reviewed and noted the following in the patient's chart:   Medical and social history Use of alcohol, tobacco or illicit drugs  Current medications and supplements including opioid prescriptions. Patient is not currently taking opioid prescriptions. Functional ability and status Nutritional status Physical activity Advanced directives List of other physicians Hospitalizations, surgeries, and ER visits in previous 12 months Vitals Screenings to include cognitive, depression, and falls Referrals and appointments  In addition, I have reviewed and discussed with patient certain preventive protocols, quality metrics, and best practice recommendations. A written personalized care plan for preventive services as well as general preventive health recommendations were provided to patient.     Donne Anon, CMA   06/23/2023   After Visit  Summary: (MyChart) Due to this being a telephonic visit, the after visit summary with patients personalized plan was offered to patient via MyChart   Nurse Notes: None

## 2023-07-07 ENCOUNTER — Other Ambulatory Visit: Payer: Self-pay

## 2023-07-07 MED ORDER — DAPAGLIFLOZIN PROPANEDIOL 10 MG PO TABS: 10.0000 mg | ORAL_TABLET | Freq: Every day | ORAL | 3 refills | Status: DC

## 2023-08-01 ENCOUNTER — Ambulatory Visit: Payer: Medicare Other | Admitting: Family Medicine

## 2023-08-07 NOTE — Patient Instructions (Incomplete)
It was good to see you again today Recommend COVID booster this fall if not done already Flu shot today  For diabetes and weight management-  Lets start you on Mounjaro 2.5 mg weekly.  If you have any question of how to use the injection device we are glad to see you for teaching session As we discussed, we will increase your dose of Mounjaro once a month as needed and tolerated.  When you are getting to the end of your month of 2.5 mg let me know and I will send in the 5 mg strength  When you start on the [facility] you can stop using glipizide! I don't think you will need both   Please let me know how this goes for you!

## 2023-08-07 NOTE — Progress Notes (Unsigned)
Healthcare at [facility] [address] [place], [place] [address] [phone]  Date:  08/10/2023   Name:  Nicole Mccann   DOB:  [birth year]   MRN:  [id number]  PCP:  Pearline Cables, MD    Chief Complaint: No chief complaint on file.   History of Present Illness:  Nicole Mccann is a 77 y.o. very pleasant female patient who presents with the following:  Pt seen today for periodic recheck-here today with her daughter History of controlled diabetes with polyneuropathy, obesity, hyperlipidemia, seizure disorder, sleep apnea on CPAP, venous insufficiency with swelling of bilateral lower extremities, asthma   Most recent visit with myself was in June-that time she had recently undergone an umbilical hernia repair with mesh at the end of May.  She was having some discomfort at the surgical site still as well as lower extremity swelling and a weight increase of about 10 pounds I had her use furosemide 20 to 40 mg daily until her weight returned to baseline  Flu vaccine-will do today Recommend COVID booster if not done already-she plans to do at pharmacy  They note her husband is getting more ill- his dementia is quite severe, he is no longer able to get   She takes lasix just on occasion- she may take once a week or so They wonder if she needs to take potassium with her Lasix, I advised if she is taking Lasix more regularly yes but if occasional use likely not needed  Patient is frustrated with her weight, she would like to lose weight so she can get around more easily and be more comfortable. No contra to GLP1  She would like to try [facility].  Discussed how to start on this medication Lab Results  Component Value Date   HGBA1C 7.1 (A) 05/30/2023    Patient Active Problem List   Diagnosis Date Noted   Umbilical hernia 04/05/2023   Open wound of skin 12/22/2022   Class 3 obesity with alveolar hypoventilation, serious  comorbidity, and body mass index (BMI) of 50.0 to 59.9 in adult (HCC) 08/10/2022   Gait disorder 08/10/2022   SOB (shortness of breath) 05/12/2021   Diastolic dysfunction 05/12/2021   Chronic diastolic heart failure (HCC) 05/12/2021   Morbid obesity (HCC) 05/12/2021   PSVT (paroxysmal supraventricular tachycardia) (HCC) 05/12/2021   Hypertriglyceridemia 05/12/2021   Diabetes mellitus due to underlying condition with hyperosmolarity without coma, without long-term current use of insulin (HCC) 05/12/2021   Olfactory aura    Low blood pressure    Hyperlipidemia    Depression    Anxiety    Allergy    Bilateral headaches 12/09/2020   Mixed hyperlipidemia 07/10/2019   Dizziness and giddiness 12/22/2016   Osteopenia 09/21/2016   Low back pain 05/26/2016   Right shoulder pain 04/04/2015   Adjustment disorder with mixed anxiety and depressed mood 03/14/2015   Rotator cuff (capsule) sprain 03/13/2015   Pseudoptosis 11/18/2014   Seizure disorder, temporal lobe, intractable (HCC) 10/17/2014   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/2014   Severe obesity (BMI >= 40) (HCC) 10/17/2014   OSA on CPAP 10/17/2014   Hypersomnia with sleep apnea 05/22/2014   Injury of left knee 08/22/2013   Shingles 07/2013   Migraine variant 05/22/2013   Disturbances of sensation of smell and taste 05/22/2013   Spinal stenosis of lumbar region 12/17/2012   Pulmonary embolism (HCC) 07/09/2012   DVT (deep venous thrombosis) (HCC) 07/07/2012  Atypical seizure (HCC) 06/07/2012   Phlebitis following infusion 06/05/2012   BENIGN POSITIONAL VERTIGO 11/06/2009   Type 2 diabetes, controlled, with peripheral neuropathy (HCC) 04/04/2009   Allergic rhinitis 04/04/2009   ASTHMA 04/04/2009   Diverticulosis of colon 04/04/2009    Past Medical History:  Diagnosis Date   Adjustment disorder with mixed anxiety and depressed mood 03/14/2015   Dr Madaline Guthrie actively treating [place]    ALLERGIC RHINITIS 04/04/2009   Qualifier:  Diagnosis of  By: Alwyn Ren MD, Chrissie Noa     Allergy    Anxiety    Dr Madaline Guthrie   Arthritis    Asthma    ASTHMA 04/04/2009   Qualifier: Diagnosis of  By: Alwyn Ren MD, [name]   Status Asthmaticus 10/2004     Atypical seizure (HCC) 06/07/2012   BENIGN POSITIONAL VERTIGO 11/06/2009   Qualifier: Diagnosis of  By: Linford Arnold MD, Santina Evans     Cataract    Complication of anesthesia    Depression    Dr Madaline Guthrie   Diabetes mellitus    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 08/17/13 seen in the emergency room; imaging negative. No labs. Referred to Dr. Pearletha Forge who recommended nonsteroidals and elevation. 10/18-11/3/14 in [place] for land and river cruise trip.  08/29/13 seen in ER in [place]; topical antibiotics & oral Ciprobay Rxed. 10/23 at second ER I&D declined ; wound   Low back pain 05/26/2016   Low blood pressure    Mixed hyperlipidemia 07/10/2019   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/2014   Olfactory aura    OSA on CPAP    Osteopenia 09/21/2016   dexa 09/2016   Phlebitis following infusion 06/05/2012   Onset 06/01/12;S/P Dilantin infusion 05/30/12 @ [facility] for atypical seizure activity with response 06/05/12 DVT of Cephalic vein elbow- mid upper arm. Xarelto 15 mg initiated 06/05/12 by Dr Jeraldine Loots , [facility] and increased to one pill twice a day. Despite this she had persistent thrombosis in the cephalic vein in the elbow and the upper forearm areas prompting change to Lovenox 110 mg e   Pulmonary embolism (HCC) 07/09/2012   Presumed based on intermediate VQ 07/07/2012 and  high clinical probability.  Definitive CT angiogram was not possible due to contrast  intolerance/allergy    Right shoulder pain 04/04/2015   Rotator cuff (capsule) sprain 03/13/2015   Seizure disorder, temporal lobe, intractable (HCC) 10/17/2014   Severe obesity (BMI >= 40) (HCC) 10/17/2014   Shingles 07/2013   Sleep apnea    Spinal stenosis of lumbar region 12/17/2012   S/P suboptimal response to Chiropractry ;Dr Kai Levins referred her  to Dr Sher[facility] [phone]; FAX [phone] MRI 12/15/12 : stenosis @ L3-4; L 4-5  in context of DDD ESI recommended . 12/17/12 Note: I recommended discussion with [name],Neurologist; & Dr Margaretmary Bayley, Diabetologist    Type 2 diabetes, controlled, with peripheral neuropathy (HCC) 04/04/2009   Qualifier: Diagnosis of  By: Alwyn Ren MD, Chrissie Noa   Dr Margaretmary Bayley, Endo Dr Emily Filbert , Ophth seen anually     Variants  of migraine, not elsewhere classified, without mention of intractable migraine without mention of status migrainosus 05/22/2013    Past Surgical History:  Procedure Laterality Date   APPENDECTOMY     ARTERY BIOPSY Left 04/14/2018   Procedure: BIOPSY TEMPORAL ARTERY;  Surgeon: Sherren Kerns, MD;  Location: [facility] OR;  Service: Vascular;  Laterality: Left;   BREAST EXCISIONAL BIOPSY Right 1998   CHOLECYSTECTOMY     COLONOSCOPY  2006   [facility]   INSERTION OF MESH N/A 04/05/2023   Procedure: INSERTION OF MESH;  Surgeon: Manus Rudd, MD;  Location: [facility] OR;  Service: General;  Laterality: N/A;   KNEE SURGERY Left    rotator cuff surgery     TONSILLECTOMY      UMBILICAL HERNIA REPAIR N/A 04/05/2023   Procedure: HERNIA REPAIR UMBILICAL ADULT WITH MESH;  Surgeon: Manus Rudd, MD;  Location: [facility] OR;  Service: General;  Laterality: N/A;    Social History   Tobacco Use   Smoking status: Never   Smokeless tobacco: Never  Vaping Use   Vaping status: Never Used  Substance Use Topics   Alcohol use: Yes    Alcohol/week: 3.0  standard drinks of alcohol    Types: 3 Glasses of wine per week    Comment: 1/2 glass wine daily   Drug use: No    Family History  Problem Relation Age of Onset   Stomach cancer Father    Coronary artery disease Father    Cancer Father    Obesity Father    Breast cancer Sister 76       breast cancer   Cancer Sister    Diabetes Maternal Aunt    Diabetes Paternal Aunt    Prostate cancer Brother    Cancer Brother    Obesity Brother    Cancer Daughter    Diabetes Daughter    Obesity Daughter    Obesity Daughter    Stroke Neg Hx     Allergies  Allergen Reactions   Mushroom Ext Cmplx(Shiitake-Reishi-Mait) Anaphylaxis   Shellfish Allergy Anaphylaxis   Sulfonamide Derivatives Anaphylaxis   Vancomycin Anaphylaxis   Fluticasone-Salmeterol Other (See Comments)    Feels like something in the throat   Butalbital-Aspirin-Caffeine Other (See Comments)    UNSPECIFIED REACTION    Keflex [Cephalexin]     Nausea/vomitting   Montelukast Sodium Other (See Comments)    Feels like she's running out of breath   Penicillins     UNSPECIFIED REACTION      Childhood allergy Has patient had a PCN reaction causing immediate rash, facial/tongue/throat swelling, SOB or lightheadedness with hypotension: Unknown Has patient had a PCN reaction causing severe rash involving mucus membranes or skin necrosis: Unknown Has patient had a PCN reaction that required hospitalization: No Has patient had a PCN reaction occurring within the last 10 years: No If all of the above answers are "NO", then may proceed with Cephalosporin use.     Acetaminophen Other (See Comments)    Stomach discomfort   Clarithromycin Nausea And Vomiting   Iodinated Contrast Media Other (See Comments)    Bad headache; no prep needed   Levofloxacin Diarrhea and Nausea And Vomiting    Dizziness Believes it was due to a high dosage of this medication   Orphenadrine Nausea And Vomiting    Medication list has been reviewed and  updated.  Current Outpatient Medications on File Prior to Visit  Medication Sig Dispense Refill   albuterol (VENTOLIN HFA) 108 (90 Base) MCG/ACT inhaler  Inhale 2 puffs into the lungs every 6 (six) hours as needed for wheezing or shortness of breath. 18 g 6   bacitracin ointment Apply 1 Application topically 2 (two) times daily. (Patient taking differently: Apply 1 Application topically 2 (two) times daily as needed for wound care.) 120 g 0   budesonide-formoterol (SYMBICORT) 160-4.5 MCG/ACT inhaler Inhale 2 puffs into the lungs 2 (two) times daily. (Patient taking differently: Inhale 2 puffs into the lungs in the morning.) 1 each 12   Calcium Carb-Cholecalciferol (CALCIUM 600+D3 PO) Take 1 tablet by mouth in the morning.     carboxymethylcellulose (REFRESH TEARS) 0.5 % SOLN Place 1-2 drops into both eyes 3 (three) times daily as needed (dry/irritated eyes.).     cetirizine (ZYRTEC) 10 MG tablet Take 10 mg by mouth at bedtime.     Cholecalciferol (VITAMIN D) 50 MCG (2000 UT) CAPS Take 2,000 Units by mouth in the morning.     dapagliflozin propanediol (FARXIGA) 10 MG TABS tablet Take 1 tablet (10 mg total) by mouth daily before breakfast. 90 tablet 3   diclofenac Sodium (VOLTAREN ARTHRITIS PAIN) 1 % GEL Apply 1 Application topically 4 (four) times daily as needed (pain.).     FLUoxetine (PROZAC) 20 MG capsule TAKE 1 CAPSULE BY MOUTH DAILY (Patient taking differently: Take 20 mg by mouth every evening.) 90 capsule 3   furosemide (LASIX) 20 MG tablet Take 1-2 tablets (20-40 mg total) by mouth daily. Use as needed for lower extremity swelling 40 tablet 3   gabapentin (NEURONTIN) 100 MG capsule TAKE 1 CAPSULE BY MOUTH 3 TIMES  DAILY (Patient taking differently: Take 100-200 mg by mouth See admin instructions. Take 1 capsule (100 mg) by mouth in the morning, take 1 capsule (100 mg) by mouth midday & take 2 capsules (200 mg) by mouth at bedtime.) 270 capsule 3   glipiZIDE (GLUCOTROL XL) 2.5 MG 24 hr tablet  Take 1 tablet (2.5 mg total) by mouth 2 (two) times daily before a meal. 180 tablet 3   ibuprofen (ADVIL) 200 MG tablet Take 400-600 mg by mouth every 8 (eight) hours as needed (pain.).     lidocaine (LIDO KING) 4 % Place 1 patch onto the skin daily as needed (pain.).     loratadine (CLARITIN) 10 MG tablet Take 10 mg by mouth in the morning.     Omega-3 Fatty Acids (FISH OIL) 1200 MG CAPS Take 1,200 mg by mouth in the morning.     Probiotic Product (DIGESTIVE ADVANTAGE PO) Take 1 capsule by mouth in the morning.     rizatriptan (MAXALT) 10 MG tablet Take 1 tablet (10 mg total) by mouth as needed for migraine. May repeat in 2 hours if needed (Patient taking differently: Take 10 mg by mouth daily as needed for migraine. May repeat in 2 hours if needed) 12 tablet 3   rosuvastatin (CRESTOR) 10 MG tablet TAKE 1 TABLET BY MOUTH DAILY 90 tablet 1   simethicone (MYLICON) 125 MG chewable tablet Chew 125-250 mg by mouth at bedtime as needed for flatulence.     Sodium Chloride-Sodium Bicarb (AYR SALINE NASAL RINSE NA) Place into the nose every other day.     No current facility-administered medications on file prior to visit.    Review of Systems:  As per HPI- otherwise negative.   Physical Examination: Vitals:   08/10/23 1615  BP: (!) 124/58  Pulse: 75  Resp: 16  Temp: 98.2 F (36.8 C)  SpO2: 95%   Vitals:  08/10/23 1615  Weight: 281 lb (127.5 kg)   Body mass index is 54.88 kg/m. Ideal Body Weight:    GEN: no acute distress.  Central obesity, otherwise looks well HEENT: Atraumatic, Normocephalic.  Ears and Nose: No external deformity. CV: RRR, No M/G/R. No JVD. No thrill. No extra heart sounds. PULM: CTA B, no wheezes, crackles, rhonchi. No retractions. No resp. distress. No accessory muscle use. ABD: S, NT, ND, +BS. No rebound. No HSM. EXTR: No c/c/e PSYCH: Normally interactive. Conversant.   BP Readings from Last 3 Encounters:  08/10/23 (!) 124/58  06/23/23 (!) 115/58   05/27/23 130/76    Assessment and Plan: Type 2 diabetes, controlled, with peripheral neuropathy (HCC) - Plan: tirzepatide (MOUNJARO) 2.5 MG/0.5ML Pen  Need for influenza vaccination - Plan: Flu Vaccine Trivalent High Dose (Fluad)  Caregiver stress  Morbid obesity (HCC) Patient seen today for follow-up.  Discussed issues involving her husband's worsening health, this is documented in a separate note in his chart  Will have her start on Mounjaro for weight control and to assist with diabetes control.  I will update her endocrinologist.  Advised we will likely want to increase her dosage after about 1 month, they will let me know when they are ready for any increased  Gave flu shot today  I asked her to see me in 4 to 5 months  Signed Abbe Amsterdam, MD

## 2023-08-10 ENCOUNTER — Ambulatory Visit: Payer: Medicare Other | Admitting: Family Medicine

## 2023-08-10 VITALS — BP 124/58 | HR 75 | Temp 98.2°F | Resp 16 | Wt 281.0 lb

## 2023-08-10 DIAGNOSIS — Z636 Dependent relative needing care at home: Secondary | ICD-10-CM | POA: Diagnosis not present

## 2023-08-10 DIAGNOSIS — Z7985 Long-term (current) use of injectable non-insulin antidiabetic drugs: Secondary | ICD-10-CM

## 2023-08-10 DIAGNOSIS — Z23 Encounter for immunization: Secondary | ICD-10-CM | POA: Diagnosis not present

## 2023-08-10 DIAGNOSIS — E1142 Type 2 diabetes mellitus with diabetic polyneuropathy: Secondary | ICD-10-CM

## 2023-08-10 MED ORDER — TIRZEPATIDE 2.5 MG/0.5ML ~~LOC~~ SOAJ: 2.5000 mg | SUBCUTANEOUS | 2 refills | Status: DC

## 2023-08-12 ENCOUNTER — Encounter: Payer: Self-pay | Admitting: Family Medicine

## 2023-08-15 ENCOUNTER — Other Ambulatory Visit: Payer: Self-pay | Admitting: Internal Medicine

## 2023-08-15 DIAGNOSIS — E1142 Type 2 diabetes mellitus with diabetic polyneuropathy: Secondary | ICD-10-CM

## 2023-09-01 ENCOUNTER — Encounter: Payer: Self-pay | Admitting: Family Medicine

## 2023-09-01 DIAGNOSIS — E1142 Type 2 diabetes mellitus with diabetic polyneuropathy: Secondary | ICD-10-CM

## 2023-09-01 MED ORDER — TIRZEPATIDE 5 MG/0.5ML ~~LOC~~ SOAJ
5.0000 mg | SUBCUTANEOUS | 1 refills | Status: DC
Start: 2023-09-01 — End: 2023-11-01

## 2023-09-01 NOTE — Addendum Note (Signed)
Addended by: Abbe Amsterdam C on: 09/01/2023 08:34 PM   Modules accepted: Orders

## 2023-09-29 LAB — HM DIABETES EYE EXAM

## 2023-09-30 ENCOUNTER — Encounter: Payer: Self-pay | Admitting: Family Medicine

## 2023-10-02 NOTE — Progress Notes (Unsigned)
Cardiology Office Note:  .   Date:  10/02/2023  ID:  Nicole Mccann, DOB July 16, 1946, MRN [id number] PCP: Pearline Cables, MD  [facility] Providers Cardiologist:  Thomasene Ripple, DO }   History of Present Illness: .   Nicole Mccann is a 77 y.o. female with history of hyperlipidemia, anxiety, OSA on CPAP,, type 2 diabetes, and asthma last seen in the office by me on 03/11/2023 for preoperative evaluation for hernia repair.  She has noted to have chronic venous insufficiency from dependent edema.  She remains on Lasix and would take an extra dose for worsening lower extremity edema.  She is here for 29-month follow-up  She comes today with complaints of left temporal headache after eating.  She continues to have some venous insufficiency skin discoloration which is concerning to her.  There is no significant edema.  She talks about her husband who has progressive Parkinson's and is now on palliative care.  This is causing him good bit of stress for her and her family.  She is medically compliant denies any new symptoms of chest pain, dyspnea on exertion, or profound fatigue.  She has been started on Mounjaro 5 mg daily for weight loss.  She is being followed by PCP for management.  ROS: As above otherwise negative  Studies Reviewed: .    Echocardiogram 04/24/2021  1. Left ventricular ejection fraction, by estimation, is 70 to 75%. The  left ventricle has hyperdynamic function. The left ventricle has no  regional wall motion abnormalities. Left ventricular diastolic parameters  are consistent with Grade I diastolic  dysfunction (impaired relaxation). Elevated left atrial pressure.   2. Right ventricular systolic function is normal. The right ventricular  size is normal.   3. The mitral valve is normal in structure. Trivial mitral valve  regurgitation. No evidence of mitral stenosis.   4. The aortic valve has an indeterminant number of cusps. Aortic valve  regurgitation is not  visualized. No aortic stenosis is present.      Physical Exam:   VS:  There were no vitals taken for this visit.   Wt Readings from Last 3 Encounters:  08/10/23 281 lb (127.5 kg)  05/27/23 282 lb (127.9 kg)  04/18/23 277 lb 3.2 oz (125.7 kg)    GEN: Well nourished, well developed in no acute distress NECK: No JVD; No carotid bruits CARDIAC: RRR, no murmurs, rubs, gallops RESPIRATORY:  Clear to auscultation without rales, wheezing or rhonchi  ABDOMEN: Soft, non-tender, non-distended EXTREMITIES:  No edema; No deformity venous stasis skin changes.  ASSESSMENT AND PLAN: .    Chronic diastolic heart failure: No evidence of volume overload today.  Will continue her on her current medication regimen.  She is having her labs closely followed by PCP.  Continue furosemide 1 to 2 tablets daily as needed for worsening lower extremity edema.  Also Farxiga 10 mg daily.  She is also encouraged to use her inhalers which she has not been using consistently to assist her with breathing.  2.  Hypercholesterolemia: She remains on statin therapy with rosuvastatin 10 mg daily.  Goal of LDL less than 100.  3.  Morbid obesity: Has recently been started on Mounjaro to help with weight loss.  She is excited about being able to lose some weight which make it easier on her for ambulation.  She will be followed closely by her primary care.  4.  Chronic venous insufficiency: I have advised her to wear compression hose and to  keep her feet elevated is much as possible.         Signed, Bettey Mare. Liborio Nixon, ANP, AACC

## 2023-10-05 ENCOUNTER — Ambulatory Visit: Payer: Medicare Other | Attending: Adult Health | Admitting: Adult Health

## 2023-10-05 ENCOUNTER — Encounter: Payer: Self-pay | Admitting: Adult Health

## 2023-10-05 VITALS — BP 126/68 | HR 81 | Temp 81.0°F | Wt 274.4 lb

## 2023-10-05 DIAGNOSIS — E662 Morbid (severe) obesity with alveolar hypoventilation: Secondary | ICD-10-CM

## 2023-10-05 DIAGNOSIS — I5032 Chronic diastolic (congestive) heart failure: Secondary | ICD-10-CM | POA: Diagnosis not present

## 2023-10-05 DIAGNOSIS — I872 Venous insufficiency (chronic) (peripheral): Secondary | ICD-10-CM

## 2023-10-05 DIAGNOSIS — Z6841 Body Mass Index (BMI) 40.0 and over, adult: Secondary | ICD-10-CM

## 2023-10-05 DIAGNOSIS — E1142 Type 2 diabetes mellitus with diabetic polyneuropathy: Secondary | ICD-10-CM | POA: Diagnosis not present

## 2023-10-05 DIAGNOSIS — E66813 Obesity, class 3: Secondary | ICD-10-CM | POA: Diagnosis not present

## 2023-10-05 MED ORDER — ROSUVASTATIN CALCIUM 10 MG PO TABS
10.0000 mg | ORAL_TABLET | Freq: Every day | ORAL | 3 refills | Status: DC
Start: 2023-10-05 — End: 2023-10-05

## 2023-10-05 MED ORDER — ROSUVASTATIN CALCIUM 10 MG PO TABS
10.0000 mg | ORAL_TABLET | Freq: Every day | ORAL | 3 refills | Status: DC
Start: 2023-10-05 — End: 2024-07-26

## 2023-10-05 NOTE — Patient Instructions (Signed)
Medication Instructions:  No Changes *If you need a refill on your cardiac medications before your next appointment, please call your pharmacy*   Lab Work: No Labs  If you have labs (blood work) drawn today and your tests are completely normal, you will receive your results only by: MyChart Message (if you have MyChart) OR A paper copy in the mail If you have any lab test that is abnormal or we need to change your treatment, we will call you to review the results.   Testing/Procedures: No Testing   Follow-Up: At [facility], you and your health needs are our priority.  As part of our continuing mission to provide you with exceptional heart care, we have created designated Provider Care Teams.  These Care Teams include your primary Cardiologist (physician) and Advanced Practice Providers (APPs -  Physician Assistants and Nurse Practitioners) who all work together to provide you with the care you need, when you need it.  We recommend signing up for the patient portal called "MyChart".  Sign up information is provided on this After Visit Summary.  MyChart is used to connect with patients for Virtual Visits (Telemedicine).  Patients are able to view lab/test results, encounter notes, upcoming appointments, etc.  Non-urgent messages can be sent to your provider as well.   To learn more about what you can do with MyChart, go to [email or web address].    Your next appointment:   6 month(s)  Provider:   Thomasene Ripple, DO

## 2023-11-01 ENCOUNTER — Other Ambulatory Visit: Payer: Self-pay | Admitting: Family Medicine

## 2023-11-01 DIAGNOSIS — E1142 Type 2 diabetes mellitus with diabetic polyneuropathy: Secondary | ICD-10-CM

## 2023-11-03 ENCOUNTER — Encounter: Payer: Self-pay | Admitting: Family Medicine

## 2023-11-03 NOTE — Telephone Encounter (Signed)
We do not have any samples of any dose.

## 2023-11-25 ENCOUNTER — Encounter: Payer: Self-pay | Admitting: Internal Medicine

## 2023-11-25 ENCOUNTER — Telehealth (INDEPENDENT_AMBULATORY_CARE_PROVIDER_SITE_OTHER): Payer: Medicare Other | Admitting: Internal Medicine

## 2023-11-25 DIAGNOSIS — E1142 Type 2 diabetes mellitus with diabetic polyneuropathy: Secondary | ICD-10-CM | POA: Diagnosis not present

## 2023-11-25 DIAGNOSIS — Z7984 Long term (current) use of oral hypoglycemic drugs: Secondary | ICD-10-CM | POA: Diagnosis not present

## 2023-11-25 DIAGNOSIS — E782 Mixed hyperlipidemia: Secondary | ICD-10-CM

## 2023-11-25 NOTE — Progress Notes (Signed)
Patient ID: [name], female   DOB: November 03, 1946, 78 y.o.   MRN: [id number]   Patient location: Home My location: Office Persons participating in the virtual visit: patient, provider  Referring Provider: Pearline Cables, MD  I connected with the patient on 11/25/23 at  11:50 AM EST by a video enabled telemedicine application and verified that I am speaking with the correct person.   I discussed the limitations of evaluation and management by telemedicine and the availability of in person appointments. The patient expressed understanding and agreed to proceed.   Details of the encounter are shown below.  HPI: Nicole Mccann is a 78 y.o.-year-old female, presenting for follow-up for DM2, dx in initially in 1965 with GDM, then again GDM in 1974, non-insulin-dependent, controlled, with long-term complications (PN).  Last visit 6 months ago. Appointment is conducted along with her daughter who offers part of the history especially related to her blood sugars, diet, medications.  Interim history: No blurry vision, chest pain she has stress incontinence.  She wears Depends. She continues to not be very mobile at home.  She has intermittent leg swelling.  She also has back pain-on lidocaine patches. She has N/AP from [facility] - getting better. Her husband is quite sick and she is taking care of him.  Her daughter is helping.  Reviewed HbA1c levels: Lab Results  Component Value Date   HGBA1C 7.1 (A) 05/30/2023   HGBA1C 7.2 (H) 04/01/2023   HGBA1C 6.4 (A) 11/26/2022   HGBA1C 6.9 (H) 09/08/2022   HGBA1C 6.7 (A) 06/04/2022   HGBA1C 6.7 (A) 01/28/2022   HGBA1C 6.8 (H) 09/09/2021   HGBA1C 6.5 11/26/2020   HGBA1C 6.8 (A) 09/05/2020   HGBA1C 6.6 (A) 05/02/2020   HGBA1C 6.3 (A) 01/09/2019   HGBA1C 5.8 (A) 09/05/2018   HGBA1C 6.3 (A) 04/18/2018   HGBA1C 6.4 12/12/2017   HGBA1C 6.2 02/09/2017   HGBA1C 6.1 01/21/2016   HGBA1C 6.0 (H) 07/08/2012   HGBA1C 6.1 (H) 07/07/2012    HGBA1C 5.8 02/13/2010   HGBA1C 6.0 06/22/2007   She was on: - Glipizide XL 2.5-5 mg (approximately 40 minutes) before breakfast  SGLT2 inh were very expensive.  We had to stop Metformin ER due to significant diarrhea.  We changed to: - Farxiga 10 mg before b'fast - from PAP - Glipizide ER 2.5 mg before a larger meal >> now Mounjaro 2.5 >> 5 mg weekly  Pt checks her sugars once a day: - am:  128-154, 171 >> 140-150s >> 112, 130-140, 154 - 2h after b'fast: 186, 198 >> 120 >> 134 >> N/c - before lunch: 130-150 >> 144-184 >> 120, 170  >> n/c - 2h after lunch: 164, 185/225 >> n/c >> 140s >> n/c - before dinner: 129-132 >> 114-146 >> 124 >> 140 >> n/c - 2h after dinner:184, 184/191 >> n/c >> <160 >> n/c - bedtime: n/c - nighttime: n/c Lowest sugar was 90 ...   >> 128 >> 130 >> 112; she has hypoglycemia awareness in the 70s. Highest: 225 >> 171 >> 170 >> 154.  Glucometer: One Touch Verio  Pt's meals are: - Breakfast: cereals, yoghurt (Activia), pastries, tea, honey >> f+ greek yoghurt + Kashi cereal - Lunch: salad, leftover sandwich, fruit - Dinner: meat, veggies, potato - Snacks: Sweets, sweet liquor Nicole Mccann) >> stopped due to headaches  No CKD, last BUN/creatinine:  Lab Results  Component Value Date   BUN 13 04/01/2023   BUN 15 02/23/2023   CREATININE 0.89 04/01/2023  CREATININE 0.88 02/23/2023   Lab Results  Component Value Date   MICRALBCREAT 1.8 09/08/2022   MICRALBCREAT 3.6 05/02/2020   MICRALBCREAT 14.0 02/13/2010   + HL; last set of lipids: Lab Results  Component Value Date   CHOL 182 09/08/2022   HDL 55.70 09/08/2022   LDLCALC 89 09/08/2022   LDLDIRECT 92.0 09/05/2020   TRIG 189.0 (H) 09/08/2022   CHOLHDL 3 09/08/2022  She refused statins in the past but we were able to start rosuvastatin 10 mg daily, which she now tolerates well.  In 08/2020  we added fish oil 1 capsule of 1000 mg a day  - Last eye exam was on 09/29/2023: no DR, + cataracts. She will need  palpebral lifting surgery and cataract sx.  - no numbness and tingling in her feet.  Last foot exam 11/26/2022. On Gabapentin.  She does have burning around her ankles and lower leg due to the leg swelling.  Pt has FH of DM in aunts.  She has OSA and is on a CPAP.  In summer 2019, she had hAs >> temporal artery Bx negative. She developed anaphylaxis to Vancomycin while in the hospital.  ROS: + see HPI  Past Medical History:  Diagnosis Date   Adjustment disorder with mixed anxiety and depressed mood 03/14/2015   [name] actively treating [place]    ALLERGIC RHINITIS 04/04/2009   Qualifier: Diagnosis of  By: Nicole Ren MD, Nicole Mccann     Allergy    Anxiety    [name]   Arthritis    Asthma    ASTHMA 04/04/2009   Qualifier: Diagnosis of  By: Nicole Ren MD, Nicole Mccann   Status Asthmaticus 10/2004     Atypical seizure (HCC) 06/07/2012   BENIGN POSITIONAL VERTIGO 11/06/2009   Qualifier: Diagnosis of  By: Nicole Arnold MD, Nicole Mccann     Cataract    Complication of anesthesia    Depression    [name]   Diabetes mellitus    Disturbances of sensation of smell and taste 05/22/2013   Diverticulosis of colon 04/04/2009   Qualifier: Diagnosis of  By: Nicole Ren MD, Nicole Mccann     Dizziness and giddiness 12/22/2016   DVT (deep venous thrombosis) (HCC)    Dyspnea    Headache(784.0)    Hyperlipidemia    Hypersomnia with sleep apnea, unspecified 05/22/2014   Injury of left knee 08/22/2013   08[place]15[place]13 [place] contusion and abrasion of left knee. 08/17/13 seen in the emergency room; imaging negative. No labs. Referred to Dr. Pearletha Mccann who recommended nonsteroidals and elevation. 10/18-11/3/14 in [place] for land and river cruise trip.  08/29/13 seen in ER in [place]; topical antibiotics & oral Ciprobay Rxed. 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 Xarelto 15 mg initiated 06/05/12 by Dr Jeraldine Loots , [facility] and increased to one pill twice a day. Despite this she had persistent thrombosis in the cephalic vein in the elbow and the upper forearm areas prompting change to Lovenox 110 mg e   Pulmonary embolism (HCC) 07/09/2012   Presumed  based on intermediate VQ 07/07/2012 and high clinical probability.  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 12/17/12 Note: I recommended discussion with [name],Neurologist; & Dr Margaretmary Bayley, Diabetologist    Type 2 diabetes, controlled, with peripheral neuropathy (HCC) 04/04/2009   Qualifier: Diagnosis of  By: Nicole Ren MD, Nicole Mccann   Dr Margaretmary Bayley, Endo Dr Emily Filbert , Ophth seen anually     Variants of migraine, not elsewhere classified, without mention of intractable migraine without mention of status migrainosus 05/22/2013   Past Surgical History:  Procedure Laterality Date   APPENDECTOMY     ARTERY BIOPSY Left 04/14/2018   Procedure: BIOPSY TEMPORAL ARTERY;  Surgeon: Sherren Kerns, MD;  Location: [facility] OR;  Service: Vascular;  Laterality: Left;   BREAST EXCISIONAL BIOPSY Right 1998   CHOLECYSTECTOMY     COLONOSCOPY  2006    [facility]   INSERTION OF MESH N/A 04/05/2023   Procedure: INSERTION OF MESH;  Surgeon: Manus Rudd, MD;  Location: [facility] OR;  Service: General;  Laterality: N/A;   KNEE SURGERY Left    rotator cuff surgery     TONSILLECTOMY      UMBILICAL HERNIA REPAIR N/A 04/05/2023   Procedure: HERNIA REPAIR UMBILICAL ADULT WITH MESH;  Surgeon: Manus Rudd, MD;  Location[facility] OR;  Service: General;  Laterality: N/A;   Social History   Socioeconomic History   Marital status: Married    Spouse name: Lars Mage   Number of children: 2   Years of education: [employer] education level: Bachelor's degree (e.g., BA, AB, BS)  Occupational History   Not on file  Tobacco Use   Smoking status: Never   Smokeless tobacco: Never  Vaping Use   Vaping status: Never Used  Substance and Sexual Activity   Alcohol use: Yes    Alcohol/week: 3.0 standard drinks of alcohol    Types: 3 Glasses of wine per week    Comment: 1/2 glass wine daily   Drug use: No   Sexual activity: Never  Other Topics Concern   Not on file  Social History Narrative   Patient is married Lars Mage) and lives at home with her husband.   Patient has two adult children.   Patient is retired.   Patient has a college education.   Patient is right-handed.   Patient drinks one cup of tea daily.   Social Drivers of Corporate investment banker Strain: Low Risk  (06/23/2023)   Overall Financial Resource Strain (CARDIA)    Difficulty of Paying Living Expenses: Not very hard  Food Insecurity: No Food Insecurity (06/23/2023)   Hunger Vital Sign    Worried About Running Out of Food in the Last Year: Never true    Ran Out of Food in the Last Year: Never true  Transportation Needs: No Transportation Needs (06/23/2023)   PRAPARE - Administrator, Civil Service (Medical): No    Lack of Transportation (Non-Medical): No  Physical Activity:  Inactive (06/23/2023)   Exercise Vital Sign    Days of Exercise per Week: 0 days    Minutes of  Exercise per Session: 0 min  Stress: No Stress Concern Present (06/23/2023)   [employer] of Occupational Health - Occupational Stress Questionnaire    Feeling of Stress : Only a little  Social Connections: Moderately Isolated (06/23/2023)   Social Connection and Isolation Panel [NHANES]    Frequency of Communication with Friends and Family: Never    Frequency of Social Gatherings with Friends and Family: Once a week    Attends Religious Services: Never    Database administrator or Organizations: Yes    Attends Banker Meetings: Never    Marital Status: Married  Catering manager Violence: Not At Risk (06/23/2023)   Humiliation, Afraid, Rape, and Kick questionnaire    Fear of Current or Ex-Partner: No    Emotionally Abused: No    Physically Abused: No    Sexually Abused: No   Current Outpatient Medications on File Prior to Visit  Medication Sig Dispense Refill   albuterol (VENTOLIN HFA) 108 (90 Base) MCG/ACT inhaler Inhale 2 puffs into the lungs every 6 (six) hours as needed for wheezing or shortness of breath. 18 g 6   bacitracin ointment Apply 1 Application topically 2 (two) times daily. (Patient taking differently: Apply 1 Application topically 2 (two) times daily as needed for wound care.) 120 g 0   budesonide-formoterol (SYMBICORT) 160-4.5 MCG/ACT inhaler Inhale 2 puffs into the lungs 2 (two) times daily. (Patient taking differently: Inhale 2 puffs into the lungs in the morning.) 1 each 12   Calcium Carb-Cholecalciferol (CALCIUM 600+D3 PO) Take 1 tablet by mouth in the morning.     carboxymethylcellulose (REFRESH TEARS) 0.5 % SOLN Place 1-2 drops into both eyes 3 (three) times daily as needed (dry/irritated eyes.).     cetirizine (ZYRTEC) 10 MG tablet Take 10 mg by mouth at bedtime.     Cholecalciferol (VITAMIN D) 50 MCG (2000 UT) CAPS Take 2,000 Units by mouth in the morning.     dapagliflozin propanediol (FARXIGA) 10 MG TABS tablet Take 1 tablet (10 mg total) by  mouth daily before breakfast. 90 tablet 3   diclofenac Sodium (VOLTAREN ARTHRITIS PAIN) 1 % GEL Apply 1 Application topically 4 (four) times daily as needed (pain.).     FLUoxetine (PROZAC) 20 MG capsule TAKE 1 CAPSULE BY MOUTH DAILY (Patient taking differently: Take 20 mg by mouth every evening.) 90 capsule 3   furosemide (LASIX) 20 MG tablet Take 1-2 tablets (20-40 mg total) by mouth daily. Use as needed for lower extremity swelling 40 tablet 3   gabapentin (NEURONTIN) 100 MG capsule TAKE 1 CAPSULE BY MOUTH 3 TIMES  DAILY (Patient taking differently: Take 100-200 mg by mouth See admin instructions. Take 1 capsule (100 mg) by mouth in the morning, take 1 capsule (100 mg) by mouth midday & take 2 capsules (200 mg) by mouth at bedtime.) 270 capsule 3   glipiZIDE (GLUCOTROL XL) 2.5 MG 24 hr tablet Take 1 tablet (2.5 mg total) by mouth 2 (two) times daily before a meal. (Patient not taking: Reported on 10/05/2023) 180 tablet 3   ibuprofen (ADVIL) 200 MG tablet Take 400-600 mg by mouth every 8 (eight) hours as needed (pain.).     lidocaine (LIDO KING) 4 % Place 1 patch onto the skin daily as needed (pain.).     loratadine (CLARITIN) 10 MG tablet Take 10 mg by mouth in  the morning.     Omega-3 Fatty Acids (FISH OIL) 1200 MG CAPS Take 1,200 mg by mouth in the morning.     Probiotic Product (DIGESTIVE ADVANTAGE PO) Take 1 capsule by mouth in the morning.     rizatriptan (MAXALT) 10 MG tablet Take 1 tablet (10 mg total) by mouth as needed for migraine. May repeat in 2 hours if needed (Patient taking differently: Take 10 mg by mouth daily as needed for migraine. May repeat in 2 hours if needed) 12 tablet 3   rosuvastatin (CRESTOR) 10 MG tablet Take 1 tablet (10 mg total) by mouth daily. 90 tablet 3   simethicone (MYLICON) 125 MG chewable tablet Chew 125-250 mg by mouth at bedtime as needed for flatulence.     Sodium Chloride-Sodium Bicarb (AYR SALINE NASAL RINSE NA) Place into the nose every other day.      tirzepatide (MOUNJARO) 5 MG/0.5ML Pen INJECT 5 MG SUBCUTANEOUSLY WEEKLY 6 mL 1   No current facility-administered medications on file prior to visit.   Allergies  Allergen Reactions   Mushroom Ext Cmplx(Shiitake-Reishi-Mait) Anaphylaxis   Shellfish Allergy Anaphylaxis   Sulfonamide Derivatives Anaphylaxis   Vancomycin Anaphylaxis   Fluticasone-Salmeterol Other (See Comments)    Feels like something in the throat   Butalbital-Aspirin-Caffeine Other (See Comments)    UNSPECIFIED REACTION    Keflex [Cephalexin]     Nausea/vomitting   Montelukast Sodium Other (See Comments)    Feels like she's running out of breath   Penicillins     UNSPECIFIED REACTION      Childhood allergy Has patient had a PCN reaction causing immediate rash, facial/tongue/throat swelling, SOB or lightheadedness with hypotension: Unknown Has patient had a PCN reaction causing severe rash involving mucus membranes or skin necrosis: Unknown Has patient had a PCN reaction that required hospitalization: No Has patient had a PCN reaction occurring within the last 10 years: No If all of the above answers are "NO", then may proceed with Cephalosporin use.     Acetaminophen Other (See Comments)    Stomach discomfort   Clarithromycin Nausea And Vomiting   Iodinated Contrast Media Other (See Comments)    Bad headache; no prep needed   Levofloxacin Diarrhea and Nausea And Vomiting    Dizziness Believes it was due to a high dosage of this medication   Orphenadrine Nausea And Vomiting   Family History  Problem Relation Age of Onset   Stomach cancer Father    Coronary artery disease Father    Cancer Father    Obesity Father    Breast cancer Sister 17       breast cancer   Cancer Sister    Diabetes Maternal Aunt    Diabetes Paternal Aunt    Prostate cancer Brother    Cancer Brother    Obesity Brother    Cancer Daughter    Diabetes Daughter    Obesity Daughter    Obesity Daughter    Stroke Neg Hx     PE: There were no vitals taken for this visit. Last wt 256 lbs. Wt Readings from Last 3 Encounters:  10/05/23 274 lb 6.4 oz (124.5 kg)  08/10/23 281 lb (127.5 kg)  05/27/23 282 lb (127.9 kg)   Constitutional:  in NAD  The physical exam was not performed (virtual visit).  ASSESSMENT: 1. DM2, non-insulin-dependent, now more controlled, with complications - PN  2. HL  3.  Obesity class III  PLAN:  1. Patient with fairly well-controlled type 2 diabetes  with multi medication intolerances: -Diabetic regimen is limited by previous side effects and preferences: Metformin IR, and ER - she was taking these inconsistently due to diarrhea and fecal incontinence low-dose glipizide XL - she felt that this was also causing her GI symptoms and stopped it.  SGLT2 inhibitors were too expensive in the past.  I sent a new prescription for these to her pharmacy in 08/2020, but she was not able to start them due to price. Daughter called the insurance and SGLT2 inhibitors were not covered. She refused injectables including insulin and GLP-1 receptor agonist. Actos is not ideal for her since this could cause more swelling. -We have her on Farxiga through the PAP program.  She tolerates this well. -At last visit, sugars were elevated in the morning, above target and they remained approximately stable throughout the rest of the day.  We discussed about continuing the same dose of Farxiga but to take the glipizide ER in the morning before the first meal of the day along with [place].  Also, I advised her that she could continue to crush the tablet of glipizide before larger dinner.  However, to help with weight loss, PCP recommended to start Mounjaro at a low dose and increase as tolerated.  She is now off glipizide and only on the GLP-1/GIP receptor agonist and SGLT2 inhibitor. -She is checking blood sugars only in the morning and they are lower than before but still slightly above goal.  She is not checking  later in the day and I did advise her to try to rotate the check times.  Otherwise, we can continue the current regimen.  I suspect that with her significant weight loss, her sugars have also improved.  - I suggested to:  Patient Instructions  Please continue: - Farxiga 10 mg before b'fast - Mounjaro 5 mg weekly  Please return in 4-6 months with your sugar log.   - we will recheck her HbA1c at next visit  - advised to check sugars at different times of the day - 1x a day, rotating check times - advised for yearly eye exams >> she is UTD - she has an appointment with PCP for an annual physical exam in 12/2023.  She will have annual labs done. - she will also need a foot exam either at next visit with PCP or when she returns to the clinic - return to clinic in 4-6 months  2.  HL -Latest lipid panel 11/2021: Fractions at goal with the exception of an elevated LDL and triglyceride level: Lab Results  Component Value Date   CHOL 182 09/08/2022   HDL 55.70 09/08/2022   LDLCALC 89 09/08/2022   LDLDIRECT 92.0 09/05/2020   TRIG 189.0 (H) 09/08/2022   CHOLHDL 3 09/08/2022  -She continues Crestor 10 mg daily and fish oil 1000 mg daily tolerated well  3.  Obesity class III -She  continues on Farxiga which should also help with weight loss. -PCP also started [facility] and stopped Glipizide.  Both of these measures should help with weight loss. -Weight was approximately stable at last visit, but she lost ~25 pounds since then  Carlus Pavlov, MD PhD [facility]

## 2023-11-25 NOTE — Patient Instructions (Signed)
Please continue: - Farxiga 10 mg before b'fast - Mounjaro 5 mg weekly  Please return in 4-6 months with your sugar log.

## 2023-12-14 ENCOUNTER — Encounter: Payer: Self-pay | Admitting: Family Medicine

## 2023-12-28 NOTE — Progress Notes (Addendum)
 [facility] [address], [address] [place], [place] [address] [phone]  Date:  12/29/2023   Name:  Nicole Mccann   DOB:  [birth year]   MRN:  [id number]  PCP:  Pearline Cables, MD    Chief Complaint: Follow-up (Pt lost her husband, trouble staying asleep /Pt is concerned of both legs / Mounjaro sent through [facility], 3 month supply )   History of Present Illness:  Nicole Mccann is a 78 y.o. very pleasant female patient who presents with the following:  Pt seen today for periodic recheck-and to discuss recent loss of her husband Last seen by myself in October History of controlled diabetes with polyneuropathy, obesity, hyperlipidemia, seizure disorder, sleep apnea on CPAP, venous insufficiency with swelling of bilateral lower extremities, asthma  Occasional lasix use We started mounjaro at our last visit  Seen by cardiology in November: Chronic diastolic heart failure: No evidence of volume overload today.  Will continue her on her current medication regimen.  She is having her labs closely followed by PCP.  Continue furosemide 1 to 2 tablets daily as needed for worsening lower extremity edema.  Also Farxiga 10 mg daily.  She is also encouraged to use her inhalers which she has not been using consistently to assist her with breathing. 2.  Hypercholesterolemia: She remains on statin therapy with rosuvastatin 10 mg daily.  Goal of LDL less than 100. 3.  Morbid obesity: Has recently been started on Mounjaro to help with weight loss.  She is excited about being able to lose some weight which make it easier on her for ambulation.  She will be followed closely by her primary care. 4.  Chronic venous insufficiency: I have advised her to wear compression hose and to keep her feet elevated is much as possible.  Seen by endocrinology about one month ago: PLAN:  1. Patient with fairly well-controlled type 2 diabetes with multi  medication intolerances: -Diabetic regimen is limited by previous side effects and preferences: Metformin IR, and ER - she was taking these inconsistently due to diarrhea and fecal incontinence low-dose glipizide XL - she felt that this was also causing her GI symptoms and stopped it.  SGLT2 inhibitors were too expensive in the past.  I sent a new prescription for these to her pharmacy in 08/2020, but she was not able to start them due to price. Daughter called the insurance and SGLT2 inhibitors were not covered. She refused injectables including insulin and GLP-1 receptor agonist. Actos is not ideal for her since this could cause more swelling. -We have her on Farxiga through the PAP program.  She tolerates this well. -At last visit, sugars were elevated in the morning, above target and they remained approximately stable throughout the rest of the day.  We discussed about continuing the same dose of Farxiga but to take the glipizide ER in the morning before the first meal of the day along with [place].  Also, I advised her that she could continue to crush the tablet of glipizide before larger dinner.  However, to help with weight loss, PCP recommended to start Mounjaro at a low dose and increase as tolerated.  She is now off glipizide and only on the GLP-1/GIP receptor agonist and SGLT2 inhibitor. -She is checking blood sugars only in the morning and they are lower than before but still slightly above goal.  She is not checking later in the day and I  did advise her to try to rotate the check times.  Otherwise, we can continue the current regimen.  I suspect that with her significant weight loss, her sugars have also improved.   Foot exam due Covid booster Urine micro can be updated  A1c can be updated Lab Results  Component Value Date   HGBA1C 7.1 (A) May 31, 2023  Her husband died just about 4 weeks ago- they were married for 60 years  Patient and her daughter are tearful today discussing this  loss She is having a hard time sleeping- melatonin did not really help   She is taking mounjaro- it may leave her with a bruise from her shot but otherwise no major side effects.  She is losing weight She is now on Mounjaro 5 mg- she has another month of medication at home.  They will let me know when next refill is due and I can increase to 7.5  She just used her albuterol prior to her visit today which is likely why her pulse is mildly elevated  Also, they note some skin breakdown of her anterior shins, right worse than left over the last several weeks.  She does have history of venous stasis dermatitis but this is a change  Wt Readings from Last 3 Encounters:  12/29/23 264 lb 6.4 oz (119.9 kg)  10/05/23 274 lb 6.4 oz (124.5 kg)  08/10/23 281 lb (127.5 kg)     Patient Active Problem List   Diagnosis Date Noted   Umbilical hernia 04/05/2023   Open wound of skin 12/22/2022   Class 3 obesity with alveolar hypoventilation, serious comorbidity, and body mass index (BMI) of 50.0 to 59.9 in adult (HCC) 08/10/2022   Gait disorder 08/10/2022   SOB (shortness of breath) 05/12/2021   Diastolic dysfunction 05/12/2021   Chronic diastolic heart failure (HCC) 05/12/2021   Morbid obesity (HCC) 05/12/2021   PSVT (paroxysmal supraventricular tachycardia) (HCC) 05/12/2021   Hypertriglyceridemia 05/12/2021   Diabetes mellitus due to underlying condition with hyperosmolarity without coma, without long-term current use of insulin (HCC) 05/12/2021   Olfactory aura    Low blood pressure    Hyperlipidemia    Depression    Anxiety    Allergy    Bilateral headaches 12/09/2020   Mixed hyperlipidemia 07/10/2019   Dizziness and giddiness 12/22/2016   Osteopenia 09/21/2016   Low back pain 05/26/2016   Right shoulder pain 04/04/2015   Adjustment disorder with mixed anxiety and depressed mood 03/14/2015   Rotator cuff (capsule) sprain 03/13/2015   Pseudoptosis 11/18/2014   Seizure disorder, temporal  lobe, intractable (HCC) 10/17/2014   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/2014   Severe obesity (BMI >= 40) (HCC) 10/17/2014   OSA on CPAP 10/17/2014   Hypersomnia with sleep apnea 05/22/2014   Injury of left knee 08/22/2013   Shingles 07/2013   Migraine variant 05/22/2013   Disturbances of sensation of smell and taste 05/22/2013   Spinal stenosis of lumbar region 12/17/2012   Pulmonary embolism (HCC) 07/09/2012   DVT (deep venous thrombosis) (HCC) 07/07/2012   Atypical seizure (HCC) 06/07/2012   Phlebitis following infusion 06/05/2012   BENIGN POSITIONAL VERTIGO 11/06/2009   Type 2 diabetes, controlled, with peripheral neuropathy (HCC) 04/04/2009   Allergic rhinitis 04/04/2009   ASTHMA 04/04/2009   Diverticulosis of colon 04/04/2009    Past Medical History:  Diagnosis Date   Adjustment disorder with mixed anxiety and depressed mood 03/14/2015   Dr Madaline Guthrie actively treating [place]    ALLERGIC RHINITIS 04/04/2009   Qualifier:  Diagnosis of  By: Alwyn Ren MD, Chrissie Noa     Allergy    Anxiety    Dr Madaline Guthrie   Arthritis    Asthma    ASTHMA 04/04/2009   Qualifier: Diagnosis of  By: Alwyn Ren MD, [name]   Status Asthmaticus 10/2004     Atypical seizure (HCC) 06/07/2012   BENIGN POSITIONAL VERTIGO 11/06/2009   Qualifier: Diagnosis of  By: Linford Arnold MD, Santina Evans     Cataract    Complication of anesthesia    Depression    Dr Madaline Guthrie   Diabetes mellitus    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 08/17/13 seen in the emergency room; imaging negative. No labs. Referred to Dr.  Pearletha Forge who recommended nonsteroidals and elevation. 10/18-11/3/14 in [place] for land and river cruise trip.  08/29/13 seen in ER in [place]; topical antibiotics & oral Ciprobay Rxed. 10/23 at second ER I&D declined ; wound   Low back pain 05/26/2016   Low blood pressure    Mixed hyperlipidemia 07/10/2019   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/2014   Olfactory aura    OSA on CPAP    Osteopenia 09/21/2016   dexa 09/2016   Phlebitis following infusion 06/05/2012   Onset 06/01/12;S/P Dilantin infusion 05/30/12 @ [facility] for atypical seizure activity with response 06/05/12 DVT of Cephalic vein elbow- mid upper arm. Xarelto 15 mg initiated 06/05/12 by Dr Jeraldine Loots , [facility] and increased to one pill twice a day. Despite this she had persistent thrombosis in the cephalic vein in the elbow and the upper forearm areas prompting change to Lovenox 110 mg e   Pulmonary embolism (HCC) 07/09/2012   Presumed based on intermediate VQ 07/07/2012 and high clinical probability.  Definitive CT angiogram was not possible due to contrast  intolerance/allergy    Right shoulder pain 04/04/2015   Rotator cuff (capsule) sprain 03/13/2015   Seizure disorder, temporal lobe, intractable (HCC) 10/17/2014   Severe obesity (BMI >= 40) (HCC) 10/17/2014   Shingles 07/2013   Sleep apnea    Spinal stenosis of lumbar region 12/17/2012   S/P suboptimal response to Chiropractry ;Dr Kai Levins referred her  to Dr Sherwood [facility] [phone]; FAX [phone] MRI 12/15/12 : stenosis @ L3-4; L 4-5  in context of DDD ESI recommended . 12/17/12 Note: I recommended discussion with [name],Neurologist; & Dr Margaretmary Bayley, Diabetologist    Type 2 diabetes, controlled, with peripheral neuropathy (HCC) 04/04/2009   Qualifier: Diagnosis of  By: Alwyn Ren MD, Chrissie Noa   Dr Margaretmary Bayley, Endo Dr Emily Filbert , Ophth seen anually     Variants of migraine, not elsewhere classified, without mention of intractable migraine without mention  of status migrainosus 05/22/2013    Past Surgical History:  Procedure Laterality Date   APPENDECTOMY     ARTERY BIOPSY Left 04/14/2018   Procedure: BIOPSY TEMPORAL ARTERY;  Surgeon: Sherren Kerns, MD;  Location: [facility] OR;  Service: Vascular;  Laterality: Left;   BREAST EXCISIONAL BIOPSY Right 1998   CHOLECYSTECTOMY     COLONOSCOPY  2006  [facility]   INSERTION OF MESH N/A 04/05/2023   Procedure: INSERTION OF MESH;  Surgeon: Manus Rudd, MD;  Location: [facility] OR;  Service: General;  Laterality: N/A;   KNEE SURGERY Left    rotator cuff surgery     TONSILLECTOMY      UMBILICAL HERNIA REPAIR N/A 04/05/2023   Procedure: HERNIA REPAIR UMBILICAL ADULT WITH MESH;  Surgeon: Manus Rudd, MD;  Location: [facility] OR;  Service: General;  Laterality: N/A;    Social History   Tobacco Use   Smoking status: Never   Smokeless tobacco: Never  Vaping Use   Vaping status: Never Used  Substance Use Topics   Alcohol use: Yes    Alcohol/week: 3.0 standard drinks of alcohol    Types: 3 Glasses of wine per week    Comment: 1/2 glass wine daily   Drug use: No    Family History  Problem Relation Age of Onset   Stomach cancer Father    Coronary artery disease Father    Cancer Father    Obesity Father    Breast cancer Sister 8       breast cancer   Cancer Sister    Diabetes Maternal Aunt    Diabetes Paternal Aunt    Prostate cancer Brother    Cancer Brother    Obesity Brother    Cancer Daughter    Diabetes Daughter    Obesity Daughter    Obesity Daughter    Stroke Neg Hx     Allergies  Allergen Reactions   Mushroom Ext Cmplx(Shiitake-Reishi-Mait) Anaphylaxis   Shellfish Allergy Anaphylaxis   Sulfonamide Derivatives Anaphylaxis   Vancomycin Anaphylaxis   Fluticasone-Salmeterol Other (See Comments)    Feels like something in the throat   Butalbital-Aspirin-Caffeine Other (See Comments)    UNSPECIFIED REACTION    Keflex [Cephalexin]     Nausea/vomitting   Montelukast Sodium Other  (See Comments)    Feels like she's running out of breath   Penicillins     UNSPECIFIED REACTION      Childhood allergy Has patient had a PCN reaction causing immediate rash, facial/tongue/throat swelling, SOB or lightheadedness with hypotension: Unknown Has patient had a PCN reaction causing severe rash involving mucus membranes or skin necrosis: Unknown Has patient had a PCN reaction that required hospitalization: No Has patient had a PCN reaction occurring within the last 10 years: No If all of the above answers are "NO", then may proceed with Cephalosporin use.     Acetaminophen Other (See Comments)    Stomach discomfort   Clarithromycin Nausea And Vomiting   Iodinated Contrast Media Other (See Comments)    Bad headache; no prep needed   Levofloxacin Diarrhea and Nausea And Vomiting    Dizziness Believes it was due to a high dosage of this medication   Orphenadrine Nausea And Vomiting    Medication list has been reviewed and updated.  Current Outpatient Medications on File Prior to Visit  Medication Sig Dispense Refill   albuterol (VENTOLIN HFA) 108 (90 Base) MCG/ACT inhaler Inhale 2 puffs into the lungs every 6 (six) hours as needed for wheezing or shortness of breath. 18 g 6   bacitracin ointment Apply 1 Application topically 2 (two) times daily. (Patient taking differently: Apply 1 Application topically 2 (two) times daily as needed for wound care.) 120 g 0   budesonide-formoterol (SYMBICORT) 160-4.5 MCG/ACT inhaler Inhale 2 puffs into the lungs 2 (two) times daily. (Patient taking differently: Inhale 2 puffs into the lungs in  the morning.) 1 each 12   Calcium Carb-Cholecalciferol (CALCIUM 600+D3 PO) Take 1 tablet by mouth in the morning.     carboxymethylcellulose (REFRESH TEARS) 0.5 % SOLN Place 1-2 drops into both eyes 3 (three) times daily as needed (dry/irritated eyes.).     cetirizine (ZYRTEC) 10 MG tablet Take 10 mg by mouth at bedtime.     Cholecalciferol (VITAMIN D) 50  MCG (2000 UT) CAPS Take 2,000 Units by mouth in the morning.     dapagliflozin propanediol (FARXIGA) 10 MG TABS tablet Take 1 tablet (10 mg total) by mouth daily before breakfast. 90 tablet 3   diclofenac Sodium (VOLTAREN ARTHRITIS PAIN) 1 % GEL Apply 1 Application topically 4 (four) times daily as needed (pain.).     FLUoxetine (PROZAC) 20 MG capsule TAKE 1 CAPSULE BY MOUTH DAILY (Patient taking differently: Take 20 mg by mouth every evening.) 90 capsule 3   furosemide (LASIX) 20 MG tablet Take 1-2 tablets (20-40 mg total) by mouth daily. Use as needed for lower extremity swelling 40 tablet 3   gabapentin (NEURONTIN) 100 MG capsule TAKE 1 CAPSULE BY MOUTH 3 TIMES  DAILY (Patient taking differently: Take 100-200 mg by mouth See admin instructions. Take 1 capsule (100 mg) by mouth in the morning, take 1 capsule (100 mg) by mouth midday & take 2 capsules (200 mg) by mouth at bedtime.) 270 capsule 3   glipiZIDE (GLUCOTROL XL) 2.5 MG 24 hr tablet Take 1 tablet (2.5 mg total) by mouth 2 (two) times daily before a meal. 180 tablet 3   ibuprofen (ADVIL) 200 MG tablet Take 400-600 mg by mouth every 8 (eight) hours as needed (pain.).     lidocaine (LIDO KING) 4 % Place 1 patch onto the skin daily as needed (pain.).     loratadine (CLARITIN) 10 MG tablet Take 10 mg by mouth in the morning.     Omega-3 Fatty Acids (FISH OIL) 1200 MG CAPS Take 1,200 mg by mouth in the morning.     Probiotic Product (DIGESTIVE ADVANTAGE PO) Take 1 capsule by mouth in the morning.     rizatriptan (MAXALT) 10 MG tablet Take 1 tablet (10 mg total) by mouth as needed for migraine. May repeat in 2 hours if needed (Patient taking differently: Take 10 mg by mouth daily as needed for migraine. May repeat in 2 hours if needed) 12 tablet 3   rosuvastatin (CRESTOR) 10 MG tablet Take 1 tablet (10 mg total) by mouth daily. 90 tablet 3   simethicone (MYLICON) 125 MG chewable tablet Chew 125-250 mg by mouth at bedtime as needed for flatulence.      Sodium Chloride-Sodium Bicarb (AYR SALINE NASAL RINSE NA) Place into the nose every other day.     tirzepatide (MOUNJARO) 5 MG/0.5ML Pen INJECT 5 MG SUBCUTANEOUSLY WEEKLY 6 mL 1   No current facility-administered medications on file prior to visit.    Review of Systems:  As per HPI- otherwise negative.   Physical Examination: Vitals:   12/29/23 1620  BP: 138/84  Pulse: (!) 112  SpO2: 99%   Vitals:   12/29/23 1620  Weight: 264 lb 6.4 oz (119.9 kg)  Height: 5' (1.524 m)   Body mass index is 51.64 kg/m. Ideal Body Weight: Weight in (lb) to have BMI = 25: 127.7  GEN: no acute distress.  Obese but has lost a bit of weight HEENT: Atraumatic, Normocephalic.  Ears and Nose: No external deformity. CV: RRR, No M/G/R. No JVD. No thrill. No  extra heart sounds. PULM: CTA B, no wheezes, crackles, rhonchi. No retractions. No resp. distress. No accessory muscle use. ABD: S, NT, ND, +BS. No rebound. No HSM. EXTR: No c/c/e PSYCH: Normally interactive. Conversant.  1+ swelling to bilateral lower extremities, venous stasis dermatitis with some minimal patchy, scaly skin breakdown across the anterior shins   Pulse Readings from Last 3 Encounters:  12/29/23 (!) 112  10/05/23 81  08/10/23 75    Assessment and Plan: Type 2 diabetes, controlled, with peripheral neuropathy (HCC) - Plan: Hemoglobin A1c, Microalbumin / creatinine urine ratio, CBC, Comprehensive metabolic panel  Morbid obesity (HCC)  Mixed hyperlipidemia  Adjustment insomnia - Plan: traZODone (DESYREL) 50 MG tablet  Stasis dermatitis - Plan: bacitracin 500 UNIT/GM ointment  Grief  Patient seen today for follow-up.  She is now taking Mounjaro for diabetes control, we suspect her lab work is likely improved.  Patient was seen too late to get lab work today, they will come back for a lab visit in the next few days at their convenience We will plan next visit pending her lab work Prescribed trazodone to try for  sleep-discussed how to use this medication We can increase your mounjaro to 7.5 when you are due for the next refill Try to elevate your legs to the level of your heart for a while daily to help with swelling Use the antibiotic ointment once or twice a day, and also can use moisturizer as needed to keep things soft If getting worse please let me know  Please schedule a lab visit only at your convenience and we will get your A1c, etc  I am so sorry for your loss!     Signed Abbe Amsterdam, MD  Addendum 2/26, received labs as below.  Message to patient    Chemistry      Component Value Date/Time   NA 141 01/04/2024 0811   NA 141 01/21/2016 0000   K 4.2 01/04/2024 0811   CL 102 01/04/2024 0811   CO2 30 01/04/2024 0811   BUN 13 01/04/2024 0811   BUN 16 01/21/2016 0000   CREATININE 0.88 01/04/2024 0811   GLU 104 01/21/2016 0000      Component Value Date/Time   CALCIUM 9.2 01/04/2024 0811   ALKPHOS 54 01/04/2024 0811   AST 20 01/04/2024 0811   ALT 23 01/04/2024 0811   BILITOT 0.5 01/04/2024 0811     Lab Results  Component Value Date   HGBA1C 6.4 01/04/2024   Lab Results  Component Value Date   WBC 8.4 01/04/2024   HGB 15.5 (H) 01/04/2024   HCT 47.2 (H) 01/04/2024   MCV 94.7 01/04/2024   PLT 214.0 01/04/2024

## 2023-12-29 ENCOUNTER — Encounter: Payer: Self-pay | Admitting: Family Medicine

## 2023-12-29 ENCOUNTER — Ambulatory Visit: Payer: Medicare Other | Admitting: Family Medicine

## 2023-12-29 VITALS — BP 138/84 | HR 105 | Ht 60.0 in | Wt 264.4 lb

## 2023-12-29 DIAGNOSIS — I872 Venous insufficiency (chronic) (peripheral): Secondary | ICD-10-CM

## 2023-12-29 DIAGNOSIS — Z7985 Long-term (current) use of injectable non-insulin antidiabetic drugs: Secondary | ICD-10-CM

## 2023-12-29 DIAGNOSIS — F4321 Adjustment disorder with depressed mood: Secondary | ICD-10-CM

## 2023-12-29 DIAGNOSIS — E1142 Type 2 diabetes mellitus with diabetic polyneuropathy: Secondary | ICD-10-CM

## 2023-12-29 DIAGNOSIS — E782 Mixed hyperlipidemia: Secondary | ICD-10-CM

## 2023-12-29 DIAGNOSIS — F5102 Adjustment insomnia: Secondary | ICD-10-CM | POA: Diagnosis not present

## 2023-12-29 MED ORDER — BACITRACIN 500 UNIT/GM EX OINT
1.0000 | TOPICAL_OINTMENT | Freq: Two times a day (BID) | CUTANEOUS | 1 refills | Status: DC
Start: 2023-12-29 — End: 2024-04-27

## 2023-12-29 MED ORDER — TRAZODONE HCL 50 MG PO TABS
25.0000 mg | ORAL_TABLET | Freq: Every evening | ORAL | 3 refills | Status: DC | PRN
Start: 2023-12-29 — End: 2024-01-23

## 2023-12-29 NOTE — Patient Instructions (Addendum)
 Try the trazodone as needed for sleep- I hope it is helpful for you  We can increase your mounjaro to 7.5 when you are due for the next refill Try to elevate your legs to the level of your heart for a while daily to help with swelling Use the antibiotic ointment once or twice a day, and also can use moisturizer as needed to keep things soft If getting worse please let me know  Please schedule a lab visit only at your convenience and we will get your A1c, etc  I am so sorry for your loss!

## 2024-01-04 ENCOUNTER — Encounter: Payer: Self-pay | Admitting: Family Medicine

## 2024-01-04 ENCOUNTER — Other Ambulatory Visit (INDEPENDENT_AMBULATORY_CARE_PROVIDER_SITE_OTHER): Payer: Medicare Other

## 2024-01-04 DIAGNOSIS — E1142 Type 2 diabetes mellitus with diabetic polyneuropathy: Secondary | ICD-10-CM

## 2024-01-04 LAB — CBC
HCT: 47.2 % — ABNORMAL HIGH (ref 36.0–46.0)
Hemoglobin: 15.5 g/dL — ABNORMAL HIGH (ref 12.0–15.0)
MCHC: 32.8 g/dL (ref 30.0–36.0)
MCV: 94.7 fl (ref 78.0–100.0)
Platelets: 214 10*3/uL (ref 150.0–400.0)
RBC: 4.99 Mil/uL (ref 3.87–5.11)
RDW: 13.8 % (ref 11.5–15.5)
WBC: 8.4 10*3/uL (ref 4.0–10.5)

## 2024-01-04 LAB — COMPREHENSIVE METABOLIC PANEL
ALT: 23 U/L (ref 0–35)
AST: 20 U/L (ref 0–37)
Albumin: 4 g/dL (ref 3.5–5.2)
Alkaline Phosphatase: 54 U/L (ref 39–117)
BUN: 13 mg/dL (ref 6–23)
CO2: 30 meq/L (ref 19–32)
Calcium: 9.2 mg/dL (ref 8.4–10.5)
Chloride: 102 meq/L (ref 96–112)
Creatinine, Ser: 0.88 mg/dL (ref 0.40–1.20)
GFR: 62.24 mL/min (ref >60.00)
Glucose, Bld: 139 mg/dL — ABNORMAL HIGH (ref 70–99)
Potassium: 4.2 meq/L (ref 3.5–5.1)
Sodium: 141 meq/L (ref 135–145)
Total Bilirubin: 0.5 mg/dL (ref 0.2–1.2)
Total Protein: 6.7 g/dL (ref 6.0–8.3)

## 2024-01-04 LAB — HEMOGLOBIN A1C: Hgb A1c MFr Bld: 6.4 % (ref 4.6–6.5)

## 2024-01-04 LAB — MICROALBUMIN / CREATININE URINE RATIO
Creatinine,U: 69.7 mg/dL
Microalb Creat Ratio: 11.5 mg/g (ref 0.0–30.0)
Microalb, Ur: 0.8 mg/dL (ref 0.0–1.9)

## 2024-01-05 ENCOUNTER — Other Ambulatory Visit: Payer: Self-pay | Admitting: Family Medicine

## 2024-01-05 DIAGNOSIS — Z1231 Encounter for screening mammogram for malignant neoplasm of breast: Secondary | ICD-10-CM

## 2024-01-12 ENCOUNTER — Encounter: Payer: Self-pay | Admitting: Family Medicine

## 2024-01-12 DIAGNOSIS — R32 Unspecified urinary incontinence: Secondary | ICD-10-CM

## 2024-01-12 DIAGNOSIS — E1142 Type 2 diabetes mellitus with diabetic polyneuropathy: Secondary | ICD-10-CM

## 2024-01-12 DIAGNOSIS — F5102 Adjustment insomnia: Secondary | ICD-10-CM

## 2024-01-12 NOTE — Telephone Encounter (Signed)
 Called and spoke with Nicole Mccann and [name] She notes her mom is "not feeling too well," she feels fatigued but no other particular symptoms She is not particularly short of breath Her pulse is still over 100 off and on She has not used albuterol recently  I recommended having her seen in the ER if she is not feeling well.  However patient declines at this time.  She also declines an appointment in the office tomorrow.  Her daughter will keep a close eye on her, if she is getting worse or not feeling better she will take her to the ER.  I am also glad to see her in the office next week if her symptoms persist

## 2024-01-18 MED ORDER — MIRABEGRON ER 50 MG PO TB24: 50.0000 mg | ORAL_TABLET | Freq: Every day | ORAL | 3 refills | Status: DC

## 2024-01-18 NOTE — Addendum Note (Signed)
 Addended by: Abbe Amsterdam C on: 01/18/2024 10:42 AM   Modules accepted: Orders

## 2024-01-20 ENCOUNTER — Other Ambulatory Visit: Payer: Self-pay | Admitting: Family Medicine

## 2024-01-20 DIAGNOSIS — F339 Major depressive disorder, recurrent, unspecified: Secondary | ICD-10-CM

## 2024-01-20 DIAGNOSIS — E1142 Type 2 diabetes mellitus with diabetic polyneuropathy: Secondary | ICD-10-CM

## 2024-01-21 ENCOUNTER — Other Ambulatory Visit: Payer: Self-pay | Admitting: Family Medicine

## 2024-01-21 DIAGNOSIS — F5102 Adjustment insomnia: Secondary | ICD-10-CM

## 2024-01-23 MED ORDER — TIRZEPATIDE 7.5 MG/0.5ML ~~LOC~~ SOAJ
7.5000 mg | SUBCUTANEOUS | 1 refills | Status: DC
Start: 2024-01-23 — End: 2024-04-10

## 2024-01-23 MED ORDER — TRAZODONE HCL 50 MG PO TABS
50.0000 mg | ORAL_TABLET | Freq: Every evening | ORAL | 3 refills | Status: DC | PRN
Start: 2024-01-23 — End: 2024-07-05

## 2024-01-23 NOTE — Addendum Note (Signed)
 Addended by: Pearline Cables on: 01/23/2024 06:54 PM   Modules accepted: Orders

## 2024-02-24 ENCOUNTER — Ambulatory Visit
Admission: RE | Admit: 2024-02-24 | Discharge: 2024-02-24 | Disposition: A | Discharge status: Home / Self Care | Payer: Medicare Other | Source: Ambulatory Visit | Attending: Family Medicine | Admitting: Family Medicine

## 2024-02-24 DIAGNOSIS — Z1231 Encounter for screening mammogram for malignant neoplasm of breast: Secondary | ICD-10-CM

## 2024-02-27 ENCOUNTER — Encounter: Payer: Self-pay | Admitting: Family Medicine

## 2024-02-27 DIAGNOSIS — R300 Dysuria: Secondary | ICD-10-CM

## 2024-04-08 ENCOUNTER — Other Ambulatory Visit: Payer: Self-pay | Admitting: Family Medicine

## 2024-04-08 DIAGNOSIS — E1142 Type 2 diabetes mellitus with diabetic polyneuropathy: Secondary | ICD-10-CM

## 2024-04-09 ENCOUNTER — Other Ambulatory Visit: Payer: Self-pay | Admitting: Family Medicine

## 2024-04-09 DIAGNOSIS — R32 Unspecified urinary incontinence: Secondary | ICD-10-CM

## 2024-04-10 ENCOUNTER — Other Ambulatory Visit: Payer: Self-pay | Admitting: Family Medicine

## 2024-04-10 DIAGNOSIS — R32 Unspecified urinary incontinence: Secondary | ICD-10-CM

## 2024-04-10 DIAGNOSIS — E1142 Type 2 diabetes mellitus with diabetic polyneuropathy: Secondary | ICD-10-CM

## 2024-04-10 MED ORDER — MIRABEGRON ER 50 MG PO TB24
50.0000 mg | ORAL_TABLET | Freq: Every day | ORAL | 3 refills | Status: DC
Start: 2024-04-10 — End: 2024-09-17

## 2024-04-10 MED ORDER — TIRZEPATIDE 7.5 MG/0.5ML ~~LOC~~ SOAJ: 7.5000 mg | SUBCUTANEOUS | 3 refills | Status: DC

## 2024-04-11 ENCOUNTER — Telehealth: Payer: Self-pay

## 2024-04-11 ENCOUNTER — Other Ambulatory Visit (HOSPITAL_BASED_OUTPATIENT_CLINIC_OR_DEPARTMENT_OTHER): Payer: Self-pay

## 2024-04-11 NOTE — Telephone Encounter (Signed)
 PA not needed- refill too soon.

## 2024-04-11 NOTE — Telephone Encounter (Signed)
 Pharmacy Patient Advocate Encounter   Received notification from RX Request Messages that prior authorization for Myrbetriq  50mg  is required/requested.   Insurance verification completed.   The patient is insured through [employer] .   Per test claim: Refill too soon. PA is not needed at this time. Medication was filled 04/10/24. Next eligible fill date is 06/17/24.

## 2024-04-27 ENCOUNTER — Encounter: Payer: Self-pay | Admitting: Internal Medicine

## 2024-04-27 ENCOUNTER — Ambulatory Visit (INDEPENDENT_AMBULATORY_CARE_PROVIDER_SITE_OTHER): Payer: Medicare Other | Admitting: Internal Medicine

## 2024-04-27 VITALS — BP 120/70 | HR 81 | Ht 60.0 in | Wt 264.4 lb

## 2024-04-27 DIAGNOSIS — Z7984 Long term (current) use of oral hypoglycemic drugs: Secondary | ICD-10-CM | POA: Diagnosis not present

## 2024-04-27 DIAGNOSIS — E1142 Type 2 diabetes mellitus with diabetic polyneuropathy: Secondary | ICD-10-CM

## 2024-04-27 DIAGNOSIS — Z7985 Long-term (current) use of injectable non-insulin antidiabetic drugs: Secondary | ICD-10-CM

## 2024-04-27 DIAGNOSIS — E782 Mixed hyperlipidemia: Secondary | ICD-10-CM | POA: Diagnosis not present

## 2024-04-27 NOTE — Patient Instructions (Addendum)
 Please continue: - Farxiga  10 mg before b'fast - Mounjaro  5 mg weekly  Please try Mylanta for 1-2 days after injecting Mounjaro .  Please return in 4-6 months with your sugar log.

## 2024-04-27 NOTE — Progress Notes (Signed)
 Patient ID: CLEORA Mccann, female   DOB: 09-11-1946, 78 y.o.   MRN: [id number]   HPI: Nicole Mccann is a 78 y.o.-year-old female, presenting for follow-up for DM2, dx in initially in 1965 with GDM, then again GDM in 1974, non-insulin -dependent, controlled, with long-term complications (PN).  Last visit 6 months ago (virtual). Appointment is conducted along with her daughter who offers part of the history especially related to her blood sugars, diet, medications.  Interim history: No blurry vision, chest pain. She has stress incontinence.  She wears Depends. She continues to not be very mobile at home.  She has intermittent leg swelling.  She also has back pain. She describes bloating after meals with Mounjaro . [name] is helping. At our last visit, she was very stressed about her husband, was quite sick.  He passed away in 2024-01-09.  They are having a hard time grieving and daughter is trying to organize all the paperwork.  Reviewed HbA1c levels: Lab Results  Component Value Date   HGBA1C 6.4 01/04/2024   HGBA1C 7.1 (A) 05/30/2023   HGBA1C 7.2 (H) 04/01/2023   HGBA1C 6.4 (A) 11/26/2022   HGBA1C 6.9 (H) 09/08/2022   HGBA1C 6.7 (A) 06/04/2022   HGBA1C 6.7 (A) 01/28/2022   HGBA1C 6.8 (H) 09/09/2021   HGBA1C 6.5 11/26/2020   HGBA1C 6.8 (A) 09/05/2020   HGBA1C 6.6 (A) 05/02/2020   HGBA1C 6.3 (A) 01/09/2019   HGBA1C 5.8 (A) 09/05/2018   HGBA1C 6.3 (A) 04/18/2018   HGBA1C 6.4 12/12/2017   HGBA1C 6.2 02/09/2017   HGBA1C 6.1 01/21/2016   HGBA1C 6.0 (H) 07/08/2012   HGBA1C 6.1 (H) 07/07/2012   HGBA1C 5.8 02/13/2010   She was on: - Glipizide  XL 2.5-5 mg (approximately 40 minutes) before breakfast  SGLT2 inh were very expensive.  We had to stop Metformin ER due to significant diarrhea.  We changed to: - Farxiga  10 mg before b'fast - from PAP - Mounjaro  2.5 >> 5 mg weekly Glipizide  ER was stopped when she started Mounjaro .  Pt checks her sugars once a day: - am: 140-150s >> 112,  130-140, 154 >> 112-138, 147 - 2h after b'fast: 186, 198 >> 120 >> 134 >> N/c - before lunch: 130-150 >> 144-184 >> 120, 170  >> n/c  - 2h after lunch: 164, 185/225 >> n/c >> 140s >> n/c - before dinner: 114-146 >> 124 >> 140 >> n/c >> 101-114 - 2h after dinner:184, 184/191 >> n/c >> <160 >> n/c >> 123-141 - bedtime: n/c >> 149, 150 - nighttime: n/c Lowest sugar was 90 ...   >> 128 >> 130 >> 112 >> 101; she has hypoglycemia awareness in the 70s. Highest: 225 >> 171 >> 170 >> 154>> 147  Glucometer: One Touch Verio  Pt's meals are: - Breakfast: cereals, yoghurt (Activia), pastries, tea, honey >> f+ greek yoghurt + Kashi cereal - Lunch: salad, leftover sandwich, fruit - Dinner: meat, veggies, potato - Snacks: Sweets, sweet liquor [employer]) >> stopped due to headaches  No CKD, last BUN/creatinine:  Lab Results  Component Value Date   BUN 13 01/04/2024   BUN 13 04/01/2023   CREATININE 0.88 01/04/2024   CREATININE 0.89 04/01/2023   Lab Results  Component Value Date   MICRALBCREAT 11.5 01/04/2024   MICRALBCREAT 1.8 09/08/2022   MICRALBCREAT 3.6 05/02/2020   MICRALBCREAT 14.0 02/13/2010   + HL; last set of lipids: Lab Results  Component Value Date   CHOL 182 09/08/2022   HDL 55.70 09/08/2022   LDLCALC  89 09/08/2022   LDLDIRECT 92.0 09/05/2020   TRIG 189.0 (H) 09/08/2022   CHOLHDL 3 09/08/2022  She refused statins in the past but we were able to start rosuvastatin  10 mg daily, which she now tolerates well.  In 08/2020  we added fish oil 1 capsule of 1000 mg a day  - Last eye exam was on 09/29/2023: no DR, + cataracts. She will need palpebral lifting surgery and cataract sx.  - no numbness and tingling in her feet.  Last foot exam 11/26/2022. On Gabapentin .  She does have burning around her ankles and lower leg due to the leg swelling.  Pt has FH of DM in aunts.  She has OSA and is on a CPAP.  In summer 2019, she had hAs >> temporal artery Bx negative. She developed  anaphylaxis to Vancomycin  while in the hospital.  ROS: + see HPI  Past Medical History:  Diagnosis Date   Adjustment disorder with mixed anxiety and depressed mood 03/14/2015   Dr Garnet Just actively treating Walker    ALLERGIC RHINITIS 04/04/2009   Qualifier: Diagnosis of  By: Donnice Gale MD, Sammie Crigler     Allergy    Anxiety    Dr Garnet Just   Arthritis    Asthma    ASTHMA 04/04/2009   Qualifier: Diagnosis of  By: Donnice Gale MD, [name]   Status Asthmaticus 10/2004     Atypical seizure (HCC) 06/07/2012   BENIGN POSITIONAL VERTIGO 11/06/2009   Qualifier: Diagnosis of  By: Greer Leak MD, Bynum Cassis     Cataract    Complication of anesthesia    Depression    Dr Garnet Just   Diabetes mellitus    Disturbances of sensation of smell and taste 05/22/2013   Diverticulosis of colon 04/04/2009   Qualifier: Diagnosis of  By: Donnice Gale MD, [name]     Dizziness and giddiness 12/22/2016   DVT (deep venous thrombosis) (HCC)    Dyspnea    Headache(784.0)    Hyperlipidemia    Hypersomnia with sleep apnea, unspecified 05/22/2014   Injury of left knee 08/22/2013   [place]15[place]13 [place] contusion and abrasion of left knee. 08/17/13 seen in the emergency room; imaging negative. No labs. Referred to Dr. Peggy Bowens who recommended nonsteroidals and elevation. 10/18-11/3/14 in [place] for land and river cruise trip.  08/29/13 seen in ER in [place]; topical antibiotics & oral Ciprobay Rxed. 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 Xarelto  15 mg initiated 06/05/12 by Dr Liam Redhead , [facility] and  increased to one pill twice a day. Despite this she had persistent thrombosis in the cephalic vein in the elbow and the upper forearm areas prompting change to Lovenox  110 mg e   Pulmonary embolism (HCC) 07/09/2012   Presumed based on intermediate VQ 07/07/2012 and high clinical probability.  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 12/17/12 Note: I recommended discussion with [name],Neurologist; & Dr Gladys Lamp, Diabetologist    Type 2 diabetes, controlled, with peripheral neuropathy (HCC) 04/04/2009   Qualifier: Diagnosis of  By: Donnice Gale MD, Sammie Crigler   Dr Gladys Lamp, Endo Dr Joanne Muckle , Ophth seen anually     Variants of migraine, not elsewhere classified, without mention of intractable migraine without mention of status migrainosus 05/22/2013   Past Surgical History:  Procedure Laterality Date   APPENDECTOMY     ARTERY BIOPSY Left 04/14/2018   Procedure: BIOPSY TEMPORAL ARTERY;  Surgeon: Richrd Char, MD;  Location[facility];  Service: Vascular;  Laterality: Left;   BREAST EXCISIONAL BIOPSY Right 1998   CHOLECYSTECTOMY     COLONOSCOPY  2006   [facility]   INSERTION OF MESH N/A 04/05/2023   Procedure: INSERTION OF MESH;  Surgeon: Dareen Ebbing, MD;  Location: [facility] OR;  Service: General;  Laterality: N/A;   KNEE SURGERY Left    rotator cuff surgery     TONSILLECTOMY      UMBILICAL HERNIA REPAIR N/A 04/05/2023   Procedure: HERNIA REPAIR UMBILICAL ADULT WITH MESH;  Surgeon: Dareen Ebbing, MD;  Location: [facility] OR;  Service: General;  Laterality: N/A;   Social History   Socioeconomic History   Marital status: Married    Spouse name: Claudis Cumber   Number of children: 2   Years of education: [employer] education level: Bachelor's degree (e.g., BA, AB, BS)  Occupational History   Not on file  Tobacco Use   Smoking status: Never   Smokeless tobacco: Never  Vaping Use   Vaping status: Never Used  Substance and Sexual Activity   Alcohol use: Yes    Alcohol/week: 3.0 standard drinks of alcohol    Types: 3 Glasses of wine per week    Comment: 1/2 glass wine daily   Drug use: No   Sexual activity: Never  Other Topics Concern   Not on file  Social History Narrative   Patient is married Claudis Cumber) and lives at home with her husband.   Patient has two adult children.   Patient is retired.   Patient has a college education.   Patient is right-handed.   Patient drinks one cup of tea daily.   Social Drivers of Corporate investment banker Strain: Low Risk  (12/27/2023)   Overall Financial Resource Strain (CARDIA)    Difficulty of Paying Living Expenses: Not very hard  Food Insecurity: No Food Insecurity (12/27/2023)   Hunger Vital Sign    Worried About Running Out of Food in the Last Year: Never true    Ran Out of Food in the Last Year: Never true  Transportation Needs: No Transportation Needs (12/27/2023)   PRAPARE - Administrator, Civil Service (Medical): No    Lack of Transportation (Non-Medical): No  Physical Activity: Inactive (12/27/2023)   Exercise Vital Sign    Days of Exercise per Week: 0 days    Minutes of Exercise per Session: 0 min  Stress: Stress Concern Present (12/27/2023)   [employer] of Occupational Health - Occupational Stress Questionnaire    Feeling of Stress : Very much  Social  Connections: Socially Isolated (12/27/2023)   Social Connection and Isolation Panel    Frequency of Communication with Friends and Family: Once a week     Frequency of Social Gatherings with Friends and Family: Once a week    Attends Religious Services: Never    Database administrator or Organizations: No    Attends Banker Meetings: Never    Marital Status: Widowed  Intimate Partner Violence: Not At Risk (06/23/2023)   Humiliation, Afraid, Rape, and Kick questionnaire    Fear of Current or Ex-Partner: No    Emotionally Abused: No    Physically Abused: No    Sexually Abused: No   Current Outpatient Medications on File Prior to Visit  Medication Sig Dispense Refill   albuterol  (VENTOLIN  HFA) 108 (90 Base) MCG/ACT inhaler Inhale 2 puffs into the lungs every 6 (six) hours as needed for wheezing or shortness of breath. 18 g 6   bacitracin  500 UNIT/GM ointment Apply 1 Application topically 2 (two) times daily. 15 g 1   bacitracin  ointment Apply 1 Application topically 2 (two) times daily. (Patient taking differently: Apply 1 Application topically 2 (two) times daily as needed for wound care.) 120 g 0   budesonide -formoterol  (SYMBICORT ) 160-4.5 MCG/ACT inhaler Inhale 2 puffs into the lungs 2 (two) times daily. (Patient taking differently: Inhale 2 puffs into the lungs in the morning.) 1 each 12   Calcium  Carb-Cholecalciferol (CALCIUM  600+D3 PO) Take 1 tablet by mouth in the morning.     carboxymethylcellulose (REFRESH TEARS) 0.5 % SOLN Place 1-2 drops into both eyes 3 (three) times daily as needed (dry/irritated eyes.).     cetirizine (ZYRTEC) 10 MG tablet Take 10 mg by mouth at bedtime.     Cholecalciferol (VITAMIN D) 50 MCG (2000 UT) CAPS Take 2,000 Units by mouth in the morning.     dapagliflozin  propanediol (FARXIGA ) 10 MG TABS tablet Take 1 tablet (10 mg total) by mouth daily before breakfast. 90 tablet 3   diclofenac  Sodium (VOLTAREN  ARTHRITIS PAIN) 1 % GEL Apply 1 Application topically 4 (four) times daily as needed (pain.).     FLUoxetine  (PROZAC ) 20 MG capsule TAKE 1 CAPSULE BY MOUTH DAILY 90 capsule 3   furosemide  (LASIX )  20 MG tablet Take 1-2 tablets (20-40 mg total) by mouth daily. Use as needed for lower extremity swelling 40 tablet 3   gabapentin  (NEURONTIN ) 100 MG capsule TAKE 1 CAPSULE BY MOUTH 3 TIMES  DAILY 270 capsule 3   glipiZIDE  (GLUCOTROL  XL) 2.5 MG 24 hr tablet Take 1 tablet (2.5 mg total) by mouth 2 (two) times daily before a meal. 180 tablet 3   ibuprofen  (ADVIL ) 200 MG tablet Take 400-600 mg by mouth every 8 (eight) hours as needed (pain.).     lidocaine  (LIDO KING) 4 % Place 1 patch onto the skin daily as needed (pain.).     loratadine  (CLARITIN ) 10 MG tablet Take 10 mg by mouth in the morning.     mirabegron  ER (MYRBETRIQ ) 50 MG TB24 tablet Take 1 tablet (50 mg total) by mouth daily. 90 tablet 3   Omega-3 Fatty Acids (FISH OIL) 1200 MG CAPS Take 1,200 mg by mouth in the morning.     Probiotic Product (DIGESTIVE ADVANTAGE PO) Take 1 capsule by mouth in the morning.     rizatriptan  (MAXALT ) 10 MG tablet Take 1 tablet (10 mg total) by mouth as needed for migraine. May repeat in 2 hours if needed (Patient taking differently: Take 10 mg  by mouth daily as needed for migraine. May repeat in 2 hours if needed) 12 tablet 3   rosuvastatin  (CRESTOR ) 10 MG tablet Take 1 tablet (10 mg total) by mouth daily. 90 tablet 3   simethicone (MYLICON) 125 MG chewable tablet Chew 125-250 mg by mouth at bedtime as needed for flatulence.     Sodium Chloride -Sodium Bicarb (AYR SALINE NASAL RINSE NA) Place into the nose every other day.     tirzepatide  (MOUNJARO ) 7.5 MG/0.5ML Pen Inject 7.5 mg into the skin once a week. 6 mL 3   traZODone  (DESYREL ) 50 MG tablet Take 1 tablet (50 mg total) by mouth at bedtime as needed for sleep. 90 tablet 3   No current facility-administered medications on file prior to visit.   Allergies  Allergen Reactions   Mushroom Ext Cmplx(Shiitake-Reishi-Mait) Anaphylaxis   Shellfish Allergy Anaphylaxis   Sulfonamide Derivatives Anaphylaxis   Vancomycin  Anaphylaxis   Fluticasone -Salmeterol  Other (See Comments)    Feels like something in the throat   Butalbital-Aspirin-Caffeine Other (See Comments)    UNSPECIFIED REACTION    Keflex  [Cephalexin ]     Nausea/vomitting   Montelukast Sodium Other (See Comments)    Feels like she's running out of breath   Penicillins     UNSPECIFIED REACTION      Childhood allergy Has patient had a PCN reaction causing immediate rash, facial/tongue/throat swelling, SOB or lightheadedness with hypotension: Unknown Has patient had a PCN reaction causing severe rash involving mucus membranes or skin necrosis: Unknown Has patient had a PCN reaction that required hospitalization: No Has patient had a PCN reaction occurring within the last 10 years: No If all of the above answers are NO, then may proceed with Cephalosporin use.     Acetaminophen  Other (See Comments)    Stomach discomfort   Clarithromycin Nausea And Vomiting   Iodinated Contrast Media Other (See Comments)    Bad headache; no prep needed   Levofloxacin Diarrhea and Nausea And Vomiting    Dizziness Believes it was due to a high dosage of this medication   Orphenadrine Nausea And Vomiting   Family History  Problem Relation Age of Onset   Stomach cancer Father    Coronary artery disease Father    Cancer Father    Obesity Father    Breast cancer Sister 60       breast cancer   Cancer Sister    Diabetes Maternal Aunt    Diabetes Paternal Aunt    Prostate cancer Brother    Cancer Brother    Obesity Brother    Cancer Daughter    Diabetes Daughter    Obesity Daughter    Obesity Daughter    Stroke Neg Hx    PE: BP 120/70   Pulse 81   Ht 5' (1.524 m)   Wt 264 lb 6.4 oz (119.9 kg)   SpO2 93%   BMI 51.64 kg/m   Wt Readings from Last 3 Encounters:  04/27/24 264 lb 6.4 oz (119.9 kg)  12/29/23 264 lb 6.4 oz (119.9 kg)  10/05/23 274 lb 6.4 oz (124.5 kg)   Constitutional: overweight, in NAD Eyes:  EOMI, no exophthalmos ENT: no neck masses, no cervical  lymphadenopathy Cardiovascular: RRR, No MRG Respiratory: CTA B Musculoskeletal: no deformities Skin:no rashes Neurological: no tremor with outstretched hands  ASSESSMENT: 1. DM2, non-insulin -dependent, now more controlled, with complications - PN  2. HL  3.  Obesity class III  PLAN:  1. Patient with fairly well-controlled type 2 diabetes  with multiple medication intolerances: - Her diabetic regimen is limited by previous side effects and preferences: Metformin IR, and ER - she was taking these inconsistently due to diarrhea and fecal incontinence low-dose glipizide  XL - she felt that this was also causing her GI symptoms and stopped it.  SGLT2 inhibitors were too expensive in the past.  I sent a new prescription for these to her pharmacy in 08/2020, but she was not able to start them due to price. Daughter called the insurance and SGLT2 inhibitors were not covered. She refused injectables including insulin  and GLP-1 receptor agonist.  However, she is now on the GLP-1/GIP receptor agonist. Actos is not ideal for her since this could cause more swelling. - She is on Farxiga  through the patient assistance program, tolerated well.  At last visit, she was only checking blood sugars in the morning and they were lower than before but still slightly above goal.  She was not taking it during the day and I advised her to try to locate the check times.  We did not change the regimen at that time.  PCP started her on Mounjaro , which we continued as she was tolerating it well except for bloating with meals.  No nausea or vomiting.   She had an HbA1c 4 months ago which was lower, at 6.4%.  At today's visit, HbA1c is even lower (see below).  Sugars appear to be improved especially later in the day and she has occasional slightly higher values in the morning, possibly due to late dinners.  They would want to continue with Mounjaro  and we discussed about trying to take Mylanta and the day of injection and  maybe the next day.  Will also continue Farxiga . - I suggested to:  Patient Instructions  Please continue: - Farxiga  10 mg before b'fast - Mounjaro  5 mg weekly  Please return in 4-6 months with your sugar log.   - we checked her HbA1c: 6.0% (lower) - advised to check sugars at different times of the day - 1x a day, rotating check times - advised for yearly eye exams >> she is UTD - return to clinic in 4-6 months  2.  HL - Latest lipid panel from 09/2022 showed an LDL above our target of less than 70, and triglycerides also slightly high: Lab Results  Component Value Date   CHOL 182 09/08/2022   HDL 55.70 09/08/2022   LDLCALC 89 09/08/2022   LDLDIRECT 92.0 09/05/2020   TRIG 189.0 (H) 09/08/2022   CHOLHDL 3 09/08/2022  -She continues Crestor  10 mg daily and fish oil 1000 mg daily, tolerated well -will  3.  Obesity class III - She  continues on Farxiga  which should also help with weight loss. - She lost approximately 25 pounds before last visit, after stopping glipizide  and starting Mounjaro  (by PCP) -weight is stable in last 4 mo,   Emilie Harden, MD PhD [facility]

## 2024-04-28 LAB — LIPID PANEL W/REFLEX DIRECT LDL
Cholesterol: 143 mg/dL (ref <200)
HDL: 51 mg/dL (ref >=50)
LDL Cholesterol (Calc): 71 mg/dL
Non-HDL Cholesterol (Calc): 92 mg/dL (ref <130)
Total CHOL/HDL Ratio: 2.8 (calc) (ref <5.0)
Triglycerides: 131 mg/dL (ref <150)

## 2024-04-30 ENCOUNTER — Ambulatory Visit: Payer: Self-pay | Admitting: Internal Medicine

## 2024-04-30 NOTE — Addendum Note (Signed)
 Addended by: TRIXIE FILE on: 04/30/2024 11:49 AM   Modules accepted: Orders

## 2024-06-20 NOTE — Progress Notes (Signed)
 [facility] [address] [place]. [place] [address] [phone]       OFFICE FOLLOW UP NOTE  [name] Date of Birth:  02/11/1946 Medical Record Number:  [id number]   Reason for visit: CPAP follow-up  Virtual Visit via Video Note  I connected with Nicole Mccann on 06/20/24 at  3:45 PM EDT by a video enabled telemedicine application and verified that I am speaking with the correct person using two identifiers.  Location: Patient: at home Provider: in office   I discussed the limitations of evaluation and management by telemedicine and the availability of in person appointments. The patient expressed understanding and agreed to proceed.    SUBJECTIVE:  Follow-up visit:  Prior visit: 06/22/2023  HPI:   Nicole Mccann is a 78 y.o. female who is being followed in this office for OSA on CPAP.  Initially seen by Dr. Chalice on 08/10/2022 with prior history of sleep apnea on CPAP machine no longer working appropriately.  Completed HST 11/7 which showed severe OSA with total AHI of 37.7/h.  Received new CPAP on 11/15.   Interval history:   Patient returns for follow-up visit via MyChart video visit.  She continues to do well with CPAP therapy.  Continues to tolerate well and reports continued benefit.  She does have high leak rate but she does not feel any leaks. ESS 8/24. Routinely followed by DME [employer] [facility].  No questions or concerns today.               ROS:   14 system review of systems performed and negative with exception of those listed in HPI  PMH:  Past Medical History:  Diagnosis Date   Adjustment disorder with mixed anxiety and depressed mood 03/14/2015   Dr Lucyann actively treating [place]    ALLERGIC RHINITIS 04/04/2009   Qualifier: Diagnosis of  By: Tish MD, Elsie     Allergy    Anxiety    Dr Lucyann   Arthritis    Asthma    ASTHMA 04/04/2009   Qualifier: Diagnosis of  By: Tish MD, [name]    Status Asthmaticus 10/2004     Atypical seizure (HCC) 06/07/2012   BENIGN POSITIONAL VERTIGO 11/06/2009   Qualifier: Diagnosis of  By: Alvan MD, Dorothyann     Cataract    Complication of anesthesia    Depression    Dr Lucyann   Diabetes mellitus    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 08/17/13 seen in the emergency room; imaging negative. No labs. Referred to Dr. Cleatrice who recommended nonsteroidals and elevation. 10/18-11/3/14 in [place] for land and river cruise trip.  08/29/13 seen in ER in [place]; topical antibiotics & oral Ciprobay Rxed. 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 Xarelto  15 mg initiated 06/05/12 by Dr Garrick , [facility] and increased to one pill twice a day. Despite this she had persistent thrombosis in the cephalic vein in the elbow and the upper forearm areas prompting change to Lovenox  110 mg e   Pulmonary embolism (HCC) 07/09/2012   Presumed based on intermediate VQ 07/07/2012 and high clinical probability.  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 12/17/12 Note: I recommended discussion with [name],Neurologist; & Dr Bonnie Gaskins, Diabetologist    Type 2 diabetes, controlled, with peripheral neuropathy (HCC) 04/04/2009   Qualifier: Diagnosis of  By: Tish MD, Elsie   Dr Bonnie Gaskins, Endo Dr Robinson , Ophth seen anually     Variants of migraine, not elsewhere classified, without mention of intractable migraine without mention of status migrainosus 05/22/2013    PSH:  Past Surgical History:  Procedure Laterality Date   APPENDECTOMY     ARTERY BIOPSY Left 04/14/2018   Procedure: BIOPSY TEMPORAL ARTERY;  Surgeon: Harvey Carlin BRAVO, MD;  Location: [facility] OR;  Service: Vascular;  Laterality: Left;   BREAST EXCISIONAL BIOPSY Right 1998   CHOLECYSTECTOMY     COLONOSCOPY  2006   [facility]   INSERTION OF MESH N/A 04/05/2023   Procedure: INSERTION OF MESH;  Surgeon: Belinda Cough, MD;  Location: [facility] OR;  Service: General;  Laterality: N/A;   KNEE SURGERY Left    rotator cuff surgery     TONSILLECTOMY      UMBILICAL HERNIA REPAIR N/A 04/05/2023   Procedure: HERNIA REPAIR UMBILICAL ADULT WITH MESH;  Surgeon: Belinda Cough, MD;  Location: [facility] OR;  Service: General;  Laterality: N/A;    Social History:  Social History   Socioeconomic History   Marital status: Married    Spouse name: Curlee   Number of children: 2   Years of education: [employer] education level: Bachelor's degree (e.g., BA, AB, BS)  Occupational History   Not on file  Tobacco Use   Smoking status: Never   Smokeless tobacco: Never  Vaping Use   Vaping  status: Never Used  Substance and Sexual Activity   Alcohol use: Yes    Alcohol/week: 3.0 standard drinks of alcohol    Types: 3 Glasses of wine per week    Comment: 1/2 glass wine daily   Drug use: No   Sexual activity: Never  Other Topics Concern   Not on file  Social History Narrative   Patient is married [place]) and lives at home with her husband.   Patient has two adult children.   Patient is retired.   Patient has a college education.   Patient is right-handed.   Patient drinks one cup of tea daily.   Social Drivers of Health   Financial Resource Strain: Low Risk  (12/27/2023)  Overall Financial Resource Strain (CARDIA)    Difficulty of Paying Living Expenses: Not very hard  Food Insecurity: No Food Insecurity (12/27/2023)   Hunger Vital Sign    Worried About Running Out of Food in the Last Year: Never true    Ran Out of Food in the Last Year: Never true  Transportation Needs: No Transportation Needs (12/27/2023)   PRAPARE - Administrator, Civil Service (Medical): No    Lack of Transportation (Non-Medical): No  Physical Activity: Inactive (12/27/2023)   Exercise Vital Sign    Days of Exercise per Week: 0 days    Minutes of Exercise per Session: 0 min  Stress: Stress Concern Present (12/27/2023)   [employer] of Occupational Health - Occupational Stress Questionnaire    Feeling of Stress : Very much  Social Connections: Socially Isolated (12/27/2023)   Social Connection and Isolation Panel    Frequency of Communication with Friends and Family: Once a week    Frequency of Social Gatherings with Friends and Family: Once a week    Attends Religious Services: Never    Database administrator or Organizations: No    Attends Banker Meetings: Never    Marital Status: Widowed  Intimate Partner Violence: Not At Risk (06/23/2023)   Humiliation, Afraid, Rape, and Kick questionnaire    Fear of Current or Ex-Partner: No    Emotionally Abused: No     Physically Abused: No    Sexually Abused: No    Family History:  Family History  Problem Relation Age of Onset   Stomach cancer Father    Coronary artery disease Father    Cancer Father    Obesity Father    Breast cancer Sister 72       breast cancer   Cancer Sister    Diabetes Maternal Aunt    Diabetes Paternal Aunt    Prostate cancer Brother    Cancer Brother    Obesity Brother    Cancer Daughter    Diabetes Daughter    Obesity Daughter    Obesity Daughter    Stroke Neg Hx     Medications:   Current Outpatient Medications on File Prior to Visit  Medication Sig Dispense Refill   albuterol (VENTOLIN HFA) 108 (90 Base) MCG/ACT inhaler Inhale 2 puffs into the lungs every 6 (six) hours as needed for wheezing or shortness of breath. 18 g 6   bacitracin ointment Apply 1 Application topically 2 (two) times daily. (Patient not taking: Reported on 04/27/2024) 120 g 0   budesonide-formoterol (SYMBICORT) 160-4.5 MCG/ACT inhaler Inhale 2 puffs into the lungs 2 (two) times daily. (Patient taking differently: Inhale 2 puffs into the lungs in the morning.) 1 each 12   Calcium Carb-Cholecalciferol (CALCIUM 600+D3 PO) Take 1 tablet by mouth in the morning.     carboxymethylcellulose (REFRESH TEARS) 0.5 % SOLN Place 1-2 drops into both eyes 3 (three) times daily as needed (dry/irritated eyes.).     cetirizine (ZYRTEC) 10 MG tablet Take 10 mg by mouth at bedtime.     Cholecalciferol (VITAMIN D) 50 MCG (2000 UT) CAPS Take 2,000 Units by mouth in the morning.     dapagliflozin propanediol (FARXIGA) 10 MG TABS tablet Take 1 tablet (10 mg total) by mouth daily before breakfast. 90 tablet 3   diclofenac Sodium (VOLTAREN ARTHRITIS PAIN) 1 % GEL Apply 1 Application topically 4 (four) times daily as needed (pain.).     FLUoxetine (PROZAC) 20 MG capsule TAKE  1 CAPSULE BY MOUTH DAILY 90 capsule 3   furosemide  (LASIX ) 20 MG tablet Take 1-2 tablets (20-40 mg total) by mouth daily. Use as needed for lower  extremity swelling 40 tablet 3   gabapentin  (NEURONTIN ) 100 MG capsule TAKE 1 CAPSULE BY MOUTH 3 TIMES  DAILY 270 capsule 3   ibuprofen  (ADVIL ) 200 MG tablet Take 400-600 mg by mouth every 8 (eight) hours as needed (pain.).     lidocaine  (LIDO KING) 4 % Place 1 patch onto the skin daily as needed (pain.).     loratadine  (CLARITIN ) 10 MG tablet Take 10 mg by mouth in the morning.     mirabegron  ER (MYRBETRIQ ) 50 MG TB24 tablet Take 1 tablet (50 mg total) by mouth daily. 90 tablet 3   Omega-3 Fatty Acids (FISH OIL) 1200 MG CAPS Take 1,200 mg by mouth in the morning.     Probiotic Product (DIGESTIVE ADVANTAGE PO) Take 1 capsule by mouth in the morning.     rizatriptan  (MAXALT ) 10 MG tablet Take 1 tablet (10 mg total) by mouth as needed for migraine. May repeat in 2 hours if needed (Patient taking differently: Take 10 mg by mouth daily as needed for migraine. May repeat in 2 hours if needed) 12 tablet 3   rosuvastatin  (CRESTOR ) 10 MG tablet Take 1 tablet (10 mg total) by mouth daily. 90 tablet 3   simethicone (MYLICON) 125 MG chewable tablet Chew 125-250 mg by mouth at bedtime as needed for flatulence.     Sodium Chloride -Sodium Bicarb (AYR SALINE NASAL RINSE NA) Place into the nose every other day.     tirzepatide  (MOUNJARO ) 7.5 MG/0.5ML Pen Inject 7.5 mg into the skin once a week. 6 mL 3   traZODone  (DESYREL ) 50 MG tablet Take 1 tablet (50 mg total) by mouth at bedtime as needed for sleep. 90 tablet 3   No current facility-administered medications on file prior to visit.    Allergies:   Allergies  Allergen Reactions   Mushroom Ext Cmplx(Shiitake-Reishi-Mait) Anaphylaxis   Shellfish Allergy Anaphylaxis   Sulfonamide Derivatives Anaphylaxis   Vancomycin  Anaphylaxis   Fluticasone -Salmeterol Other (See Comments)    Feels like something in the throat   Butalbital-Aspirin-Caffeine Other (See Comments)    UNSPECIFIED REACTION    Keflex  [Cephalexin ]     Nausea/vomitting   Montelukast Sodium  Other (See Comments)    Feels like she's running out of breath   Penicillins     UNSPECIFIED REACTION      Childhood allergy Has patient had a PCN reaction causing immediate rash, facial/tongue/throat swelling, SOB or lightheadedness with hypotension: Unknown Has patient had a PCN reaction causing severe rash involving mucus membranes or skin necrosis: Unknown Has patient had a PCN reaction that required hospitalization: No Has patient had a PCN reaction occurring within the last 10 years: No If all of the above answers are NO, then may proceed with Cephalosporin use.     Acetaminophen  Other (See Comments)    Stomach discomfort   Clarithromycin Nausea And Vomiting   Iodinated Contrast Media Other (See Comments)    Bad headache; no prep needed   Levofloxacin Diarrhea and Nausea And Vomiting    Dizziness Believes it was due to a high dosage of this medication   Orphenadrine Nausea And Vomiting      OBJECTIVE:  Physical Exam N/A d/t visit type        ASSESSMENT/PLAN: Nicole Mccann is a 78 y.o. year old female    OSA  on CPAP :  Compliance report shows satisfactory usage with optimal residual AHI.   Continue current pressure settings of 5-15 with EPR 3 Discussed continued nightly usage with ensuring greater than 4 hours nightly for optimal benefit and per insurance purposes.   DME [employer], continue to follow with DME company for any needed supplies or CPAP related concerns CPAP set up 09/2022      Follow up in 1 year via MyChart video visit or call earlier if needed   At the end of visit, she requests scheduling a follow up visit with Dr. Chalice prior to the 1 yr follow up as it has been a while since she has seen her and is still having left temporal pain. This symptom has been previously mentioned in Dr. Cleatrice note. Will have scheduling staff reach out to patient/daughter to schedule follow up visit.    CC:  PCP: Copland, Harlene BROCKS, MD    I  personally spent a total of 15 minutes in the care of the patient today including preparing to see the patient, performing a medically appropriate exam/evaluation, counseling and educating, placing orders, and documenting clinical information in the EHR.   Harlene Bogaert, AGNP-BC  [facility] [address] [place], [place] [address]  Phone [phone] Fax [phone] Note: This document was prepared with digital dictation and possible smart phrase technology. Any transcriptional errors that result from this process are unintentional.

## 2024-06-21 ENCOUNTER — Telehealth (INDEPENDENT_AMBULATORY_CARE_PROVIDER_SITE_OTHER): Payer: Medicare Other | Admitting: Adult Health

## 2024-06-21 ENCOUNTER — Encounter: Payer: Self-pay | Admitting: Adult Health

## 2024-06-21 DIAGNOSIS — G4733 Obstructive sleep apnea (adult) (pediatric): Secondary | ICD-10-CM

## 2024-06-21 NOTE — Progress Notes (Signed)
 Nicole Mccann D, CMA  Joylene Bradley; Garcia, Patricia; Ziegler, Melissa; Tucker, Dolanda; Cain, [place] New orders have been placed for the above pt, DOB: 03-Dec-2045 Thanks

## 2024-06-26 ENCOUNTER — Ambulatory Visit (INDEPENDENT_AMBULATORY_CARE_PROVIDER_SITE_OTHER)

## 2024-06-26 VITALS — Ht 60.0 in | Wt 256.0 lb

## 2024-06-26 DIAGNOSIS — Z0000 Encounter for general adult medical examination without abnormal findings: Secondary | ICD-10-CM | POA: Diagnosis not present

## 2024-06-26 NOTE — Patient Instructions (Signed)
 Nicole Mccann , Thank you for taking time out of your busy schedule to complete your Annual Wellness Visit with me. I enjoyed our conversation and look forward to speaking with you again next year. I, as well as your care team,  appreciate your ongoing commitment to your health goals. Please review the following plan we discussed and let me know if I can assist you in the future. Your Game plan/ To Do List    Referrals: If you haven't heard from the office you've been referred to, please reach out to them at the phone provided.   Follow up Visits: We will see or speak with you next year for your Next Medicare AWV with our clinical staff 07/02/25 @ 3:50p  Have you seen your provider in the last 6 months (3 months if uncontrolled diabetes)?   Clinician Recommendations:  Aim for 30 minutes of exercise or brisk walking, 6-8 glasses of water, and 5 servings of fruits and vegetables each day.       This is a list of the screenings recommended for you:  Health Maintenance  Topic Date Due   COVID-19 Vaccine (6 - 2024-25 season) 07/10/2023   Flu Shot  06/08/2024   Hemoglobin A1C  07/03/2024   Eye exam for diabetics  09/28/2024   Yearly kidney function blood test for diabetes  01/03/2025   Yearly kidney health urinalysis for diabetes  01/03/2025   Complete foot exam   04/27/2025   Medicare Annual Wellness Visit  06/26/2025   DTaP/Tdap/Td vaccine (4 - Td or Tdap) 12/18/2032   Pneumococcal Vaccine for age over 76  Completed   DEXA scan (bone density measurement)  Completed   Zoster (Shingles) Vaccine  Completed   HPV Vaccine  Aged Out   Meningitis B Vaccine  Aged Out   Pneumococcal Vaccine  Discontinued   Hepatitis C Screening  Discontinued    Advanced directives: (Declined) Advance directive discussed with you today. Even though you declined this today, please call our office should you change your mind, and we can give you the proper paperwork for you to fill out. Advance Care Planning is  important because it:  [x]  Makes sure you receive the medical care that is consistent with your values, goals, and preferences  [x]  It provides guidance to your family and loved ones and reduces their decisional burden about whether or not they are making the right decisions based on your wishes.  Follow the link provided in your after visit summary or read over the paperwork we have mailed to you to help you started getting your Advance Directives in place. If you need assistance in completing these, please reach out to us  so that we can help you!  See attachments for Preventive Care and Fall Prevention Tips.

## 2024-06-26 NOTE — Progress Notes (Signed)
 Subjective:   Nicole Mccann is a 78 y.o. who presents for a Medicare Wellness preventive visit.  As a reminder, Annual Wellness Visits don't include a physical exam, and some assessments may be limited, especially if this visit is performed virtually. We may recommend an in-person follow-up visit with your provider if needed.  Visit Complete: Virtual I connected with  Nicole Mccann on 06/26/24 by a audio enabled telemedicine application and verified that I am speaking with the correct person using two identifiers.  Patient Location: Home  Provider Location: Home Office  I discussed the limitations of evaluation and management by telemedicine. The patient expressed understanding and agreed to proceed.  Vital Signs: Because this visit was a virtual/telehealth visit, some criteria may be missing or patient reported. Any vitals not documented were not able to be obtained and vitals that have been documented are patient reported.    Persons Participating in Visit: Patient.  AWV Questionnaire: Yes: Patient Medicare AWV questionnaire was completed by the patient on 06/26/24; I have confirmed that all information answered by patient is correct and no changes since this date.  Cardiac Risk Factors include: advanced age (>32men, >55 women);diabetes mellitus     Objective:    Today's Vitals   06/26/24 1555  Weight: 256 lb (116.1 kg)  Height: 5' (1.524 m)   Body mass index is 50 kg/m.     06/26/2024    4:16 PM 06/23/2023    3:48 PM 04/05/2023   11:17 AM 04/01/2023    8:31 AM 06/14/2022    3:48 PM 06/09/2021    3:52 PM 07/07/2020    4:31 PM  Advanced Directives  Does Patient Have a Medical Advance Directive? No Yes Yes Yes Yes Yes Yes  Type of Furniture conservator/restorer;Living will Healthcare Power of [place];Living will Healthcare Power of [place];Living will Healthcare Power of [place];Living will Healthcare Power of [place];Living will   Does patient  want to make changes to medical advance directive?   No - Patient declined No - Patient declined No - Patient declined  No - Patient declined  Copy of Healthcare Power of Attorney in Chart?  No - copy requested No - copy requested  No - copy requested No - copy requested   Would patient like information on creating a medical advance directive? No - Patient declined          Current Medications (verified) Outpatient Encounter Medications as of 06/26/2024  Medication Sig   albuterol  (VENTOLIN  HFA) 108 (90 Base) MCG/ACT inhaler Inhale 2 puffs into the lungs every 6 (six) hours as needed for wheezing or shortness of breath.   bacitracin  ointment Apply 1 Application topically 2 (two) times daily. (Patient not taking: Reported on 04/27/2024)   budesonide -formoterol  (SYMBICORT ) 160-4.5 MCG/ACT inhaler Inhale 2 puffs into the lungs 2 (two) times daily. (Patient taking differently: Inhale 2 puffs into the lungs in the morning.)   Calcium  Carb-Cholecalciferol (CALCIUM  600+D3 PO) Take 1 tablet by mouth in the morning.   carboxymethylcellulose (REFRESH TEARS) 0.5 % SOLN Place 1-2 drops into both eyes 3 (three) times daily as needed (dry/irritated eyes.).   cetirizine (ZYRTEC) 10 MG tablet Take 10 mg by mouth at bedtime.   Cholecalciferol (VITAMIN D) 50 MCG (2000 UT) CAPS Take 2,000 Units by mouth in the morning.   dapagliflozin  propanediol (FARXIGA ) 10 MG TABS tablet Take 1 tablet (10 mg total) by mouth daily before breakfast.   diclofenac  Sodium (VOLTAREN  ARTHRITIS PAIN) 1 %  GEL Apply 1 Application topically 4 (four) times daily as needed (pain.).   FLUoxetine  (PROZAC ) 20 MG capsule TAKE 1 CAPSULE BY MOUTH DAILY   furosemide  (LASIX ) 20 MG tablet Take 1-2 tablets (20-40 mg total) by mouth daily. Use as needed for lower extremity swelling   gabapentin  (NEURONTIN ) 100 MG capsule TAKE 1 CAPSULE BY MOUTH 3 TIMES  DAILY   ibuprofen  (ADVIL ) 200 MG tablet Take 400-600 mg by mouth every 8 (eight) hours as needed  (pain.).   lidocaine  (LIDO KING) 4 % Place 1 patch onto the skin daily as needed (pain.).   loratadine  (CLARITIN ) 10 MG tablet Take 10 mg by mouth in the morning.   mirabegron  ER (MYRBETRIQ ) 50 MG TB24 tablet Take 1 tablet (50 mg total) by mouth daily.   Omega-3 Fatty Acids (FISH OIL) 1200 MG CAPS Take 1,200 mg by mouth in the morning.   Probiotic Product (DIGESTIVE ADVANTAGE PO) Take 1 capsule by mouth in the morning.   rizatriptan  (MAXALT ) 10 MG tablet Take 1 tablet (10 mg total) by mouth as needed for migraine. May repeat in 2 hours if needed (Patient taking differently: Take 10 mg by mouth daily as needed for migraine. May repeat in 2 hours if needed)   rosuvastatin  (CRESTOR ) 10 MG tablet Take 1 tablet (10 mg total) by mouth daily.   simethicone (MYLICON) 125 MG chewable tablet Chew 125-250 mg by mouth at bedtime as needed for flatulence.   Sodium Chloride -Sodium Bicarb (AYR SALINE NASAL RINSE NA) Place into the nose every other day.   tirzepatide  (MOUNJARO ) 7.5 MG/0.5ML Pen Inject 7.5 mg into the skin once a week.   traZODone  (DESYREL ) 50 MG tablet Take 1 tablet (50 mg total) by mouth at bedtime as needed for sleep.   No facility-administered encounter medications on file as of 06/26/2024.    Allergies (verified) Mushroom ext cmplx(shiitake-reishi-mait), Shellfish allergy, Sulfonamide derivatives, Vancomycin , Fluticasone -salmeterol, Butalbital-aspirin-caffeine, Keflex  [cephalexin ], Montelukast sodium, Penicillins, Acetaminophen , Clarithromycin, Iodinated contrast media, Levofloxacin, and Orphenadrine   History: Past Medical History:  Diagnosis Date   Adjustment disorder with mixed anxiety and depressed mood 03/14/2015   Dr Lucyann actively treating [place]    ALLERGIC RHINITIS 04/04/2009   Qualifier: Diagnosis of  By: Tish MD, Elsie     Allergy    Anxiety    Dr Lucyann   Arthritis    Asthma    ASTHMA 04/04/2009   Qualifier: Diagnosis of  By: Tish MD, [name]   Status  Asthmaticus 10/2004     Atypical seizure (HCC) 06/07/2012   BENIGN POSITIONAL VERTIGO 11/06/2009   Qualifier: Diagnosis of  By: Alvan MD, Dorothyann     Cataract    Complication of anesthesia    Depression    Dr Lucyann   Diabetes mellitus    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 08/17/13 seen in the emergency room; imaging negative. No labs. Referred to Dr. Cleatrice who recommended nonsteroidals and elevation. 10/18-11/3/14 in [place] for land and river cruise trip.  08/29/13 seen in ER in [place]; topical antibiotics &  oral Ciprobay Rxed. 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 Xarelto  15 mg initiated 06/05/12 by Dr Garrick , [facility] and increased to one pill twice a day. Despite this she had persistent thrombosis in the cephalic vein in the elbow and the upper forearm areas prompting change to Lovenox  110 mg e   Pulmonary embolism (HCC) 07/09/2012   Presumed based on intermediate VQ 07/07/2012 and high clinical probability.  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 12/17/12 Note: I recommended discussion with [name],Neurologist; & Dr Bonnie Gaskins, Diabetologist    Type 2 diabetes, controlled, with peripheral neuropathy (HCC) 04/04/2009   Qualifier: Diagnosis of  By: Tish MD, Elsie   Dr Bonnie Gaskins, Endo Dr Robinson , Ophth seen anually     Variants of migraine, not elsewhere classified, without mention of intractable migraine without mention of status migrainosus 05/22/2013   Past Surgical History:  Procedure Laterality Date   APPENDECTOMY     ARTERY BIOPSY Left 04/14/2018   Procedure: BIOPSY TEMPORAL ARTERY;  Surgeon: Harvey Carlin BRAVO, MD;  Location: [facility] OR;  Service: Vascular;  Laterality: Left;   BREAST EXCISIONAL BIOPSY Right 1998   CHOLECYSTECTOMY     COLONOSCOPY  2006   [facility]   INSERTION OF MESH N/A 04/05/2023   Procedure: INSERTION OF MESH;  Surgeon: Belinda Cough, MD;  Location: [facility] OR;  Service: General;  Laterality: N/A;   KNEE SURGERY Left    rotator cuff surgery     TONSILLECTOMY      UMBILICAL HERNIA REPAIR N/A 04/05/2023   Procedure: HERNIA REPAIR UMBILICAL ADULT WITH MESH;  Surgeon: Belinda Cough, MD;  Location: [facility] OR;  Service: General;  Laterality: N/A;   Family History  Problem Relation Age of Onset   Stomach cancer Father    Coronary artery disease Father    Cancer Father    Obesity Father    Breast cancer Sister 38       breast cancer   Cancer Sister    Diabetes Maternal Aunt    Diabetes Paternal Aunt    Prostate cancer Brother    Cancer Brother    Obesity Brother    Cancer Daughter    Diabetes Daughter     Obesity Daughter    Obesity Daughter    Stroke Neg Hx    Social History   Socioeconomic History   Marital status: Married    Spouse name: Curlee   Number of children: 2   Years of education: College   Highest education level: Bachelor's  degree (e.g., BA, AB, BS)  Occupational History   Not on file  Tobacco Use   Smoking status: Never   Smokeless tobacco: Never  Vaping Use   Vaping status: Never Used  Substance and Sexual Activity   Alcohol use: Yes    Alcohol/week: 3.0 standard drinks of alcohol    Types: 3 Glasses of wine per week    Comment: 1/2 glass wine daily   Drug use: No   Sexual activity: Never  Other Topics Concern   Not on file  Social History Narrative   Patient is married Nicole Mccann) and lives at home with her husband.   Patient has two adult children.   Patient is retired.   Patient has a college education.   Patient is right-handed.   Patient drinks one cup of tea daily.   Social Drivers of Health   Financial Resource Strain: Medium Risk (06/26/2024)   Overall Financial Resource Strain (CARDIA)    Difficulty of Paying Living Expenses: Somewhat hard  Food Insecurity: No Food Insecurity (06/26/2024)   Hunger Vital Sign    Worried About Running Out of Food in the Last Year: Never true    Ran Out of Food in the Last Year: Never true  Transportation Needs: No Transportation Needs (06/26/2024)   PRAPARE - Administrator, Civil Service (Medical): No    Lack of Transportation (Non-Medical): No  Physical Activity: Inactive (06/26/2024)   Exercise Vital Sign    Days of Exercise per Week: 0 days    Minutes of Exercise per Session: Not on file  Stress: Stress Concern Present (06/26/2024)   [employer] of Occupational Health - Occupational Stress Questionnaire    Feeling of Stress: Rather much  Social Connections: Socially Isolated (06/26/2024)   Social Connection and Isolation Panel    Frequency of Communication with Friends and Family: Never     Frequency of Social Gatherings with Friends and Family: Once a week    Attends Religious Services: Never    Database administrator or Organizations: Yes    Attends Banker Meetings: 1 to 4 times per year    Marital Status: Widowed    Tobacco Counseling Counseling given: Not Answered    Clinical Intake:  Pre-visit preparation completed: Yes  Pain : No/denies pain     BMI - recorded: 50 Nutritional Status: BMI > 30  Obese Nutritional Risks: None Diabetes: Yes CBG done?: No Did pt. bring in CBG monitor from home?: No  Lab Results  Component Value Date   HGBA1C 6.4 01/04/2024   HGBA1C 7.1 (A) 05/30/2023   HGBA1C 7.2 (H) 04/01/2023     How often do you need to have someone help you when you read instructions, pamphlets, or other written materials from your doctor or pharmacy?: 3 - Sometimes (Daughter assist)  Interpreter Needed?: No  Information entered by :: Rojelio Blush LPN   Activities of Daily Living     06/26/2024    4:11 PM 06/26/2024   12:21 PM  In your present state of health, do you have any difficulty performing the following activities:  Hearing? 0 0  Vision? 0 0  Difficulty concentrating or making decisions? 0 0  Walking or climbing stairs? 1 1  Comment Uses a Restaurant manager, fast food   Dressing or bathing? 0 0  Doing errands, shopping? 1 1  Comment Daughter Advice worker and eating ? N N  Using the Toilet? N N  In the past  six months, have you accidently leaked urine? Nicole Mccann  Comment Wears Pads. Followed by PCP   Do you have problems with loss of bowel control? N N  Managing your Medications? Nicole Mccann  Comment Daughter assist   Managing your Finances? Y Y  Housekeeping or managing your Housekeeping? Nicole Mccann  Comment Daughter assist     Patient Care Team: Copland, Harlene BROCKS, MD as PCP - General (Family Medicine) Tobb, Kardie, DO as PCP - Cardiology (Cardiology) Dohmeier, Dedra, MD as Consulting Physician (Neurology) Trixie File,  MD as Consulting Physician (Internal Medicine)  I have updated your Care Teams any recent Medical Services you may have received from other providers in the past year.     Assessment:   This is a routine wellness examination for Nicole Mccann.  Hearing/Vision screen Hearing Screening - Comments:: Denies hearing difficulties   Vision Screening - Comments:: Wears rx glasses - up to date with routine eye exams with  Dr Robinson   Goals Addressed               This Visit's Progress     Lose weight (pt-stated)        Get more active.       Depression Screen     06/26/2024    4:07 PM 06/23/2023    3:47 PM 03/21/2023    4:05 PM 06/14/2022    3:49 PM 09/09/2021   11:07 AM 06/09/2021    3:57 PM 04/27/2021    4:07 PM  PHQ 2/9 Scores  PHQ - 2 Score 0 0 0 0 0 1 0  PHQ- 9 Score   0  0      Fall Risk     06/26/2024    4:13 PM 06/26/2024   12:21 PM 06/23/2023    3:00 PM 06/14/2022    3:49 PM 06/09/2021    3:55 PM  Fall Risk   Falls in the past year? 1 1 1  0 0  Number falls in past yr: 0 0 1 0 0  Injury with Fall? 0 0 1 0 0  Comment Followed by medical attention      Risk for fall due to : No Fall Risks  History of fall(s) No Fall Risks   Follow up Falls evaluation completed  Falls evaluation completed Falls evaluation completed  Falls prevention discussed      Data saved with a previous flowsheet row definition    MEDICARE RISK AT HOME:  Medicare Risk at Home Any stairs in or around the home?: Yes If so, are there any without handrails?: No Home free of loose throw rugs in walkways, pet beds, electrical cords, etc?: No Adequate lighting in your home to reduce risk of falls?: Yes Life alert?: No Use of a cane, walker or w/c?: Yes Grab bars in the bathroom?: Yes Shower chair or bench in shower?: Yes Elevated toilet seat or a handicapped toilet?: No  TIMED UP AND GO:  Was the test performed?  No  Cognitive Function: 6CIT completed    06/23/2023    3:50 PM 01/03/2017    3:08 PM  MMSE  - Mini Mental State Exam  Not completed: Unable to complete   Orientation to time  5   Orientation to Place  5   Registration  3   Attention/ Calculation  5   Recall  3   Language- name 2 objects  2   Language- repeat  1  Language- follow 3 step command  3  Language- read & follow direction  1   Write a sentence  1   Copy design  1   Total score  30      Data saved with a previous flowsheet row definition      06/03/2016    9:38 AM  Montreal Cognitive Assessment   Visuospatial/ Executive (0/5) 5  Naming (0/3) 3  Attention: Read list of digits (0/2) 2  Attention: Read list of letters (0/1) 1  Attention: Serial 7 subtraction starting at 100 (0/3) 3  Language: Repeat phrase (0/2) 1  Language : Fluency (0/1) 0  Abstraction (0/2) 2  Delayed Recall (0/5) 4  Orientation (0/6) 5  Total 26  Adjusted Score (based on education) 26      06/26/2024    4:16 PM 06/14/2022    4:02 PM  6CIT Screen  What Year? 0 points 0 points  What month? 0 points 0 points  What time? 0 points 0 points  Count back from 20 0 points 0 points  Months in reverse 0 points 4 points  Repeat phrase 0 points 2 points  Total Score 0 points 6 points    Immunizations Immunization History  Administered Date(s) Administered   Fluad Quad(high Dose 65+) 08/05/2020   Fluad Trivalent(High Dose 65+) 08/10/2023   Influenza Split 07/25/2012, 08/04/2022   Influenza Whole 09/27/2007, 08/21/2008, 09/17/2009   Influenza, High Dose Seasonal PF 09/05/2018   Influenza,inj,Quad PF,6+ Mos 07/23/2015, 08/08/2016   Influenza-Unspecified 09/02/2021   Moderna SARS-COV2 Booster Vaccination 02/11/2021, 02/20/2021   PFIZER(Purple Top)SARS-COV-2 Vaccination 01/01/2020, 01/22/2020, 08/11/2020   Pfizer Covid-19 Vaccine Bivalent Booster 68yrs & up 07/20/2021   Pneumococcal Conjugate-13 03/13/2015   Pneumococcal Polysaccharide-23 05/22/2020   Td 11/08/2001   Tdap 08/17/2013, 12/18/2022   Unspecified SARS-COV-2 Vaccination  08/04/2022   Zoster Recombinant(Shingrix) 01/14/2022, 04/02/2022    Screening Tests Health Maintenance  Topic Date Due   COVID-19 Vaccine (6 - 2024-25 season) 07/10/2023   INFLUENZA VACCINE  06/08/2024   HEMOGLOBIN A1C  07/03/2024   OPHTHALMOLOGY EXAM  09/28/2024   Diabetic kidney evaluation - eGFR measurement  01/03/2025   Diabetic kidney evaluation - Urine ACR  01/03/2025   FOOT EXAM  04/27/2025   Medicare Annual Wellness (AWV)  06/26/2025   DTaP/Tdap/Td (4 - Td or Tdap) 12/18/2032   Pneumococcal Vaccine: 50+ Years  Completed   DEXA SCAN  Completed   Zoster Vaccines- Shingrix  Completed   HPV VACCINES  Aged Out   Meningococcal B Vaccine  Aged Out   Pneumococcal Vaccine  Discontinued   Hepatitis C Screening  Discontinued    Health Maintenance  Health Maintenance Due  Topic Date Due   COVID-19 Vaccine (6 - 2024-25 season) 07/10/2023   INFLUENZA VACCINE  06/08/2024   Health Maintenance Items Addressed:   Additional Screening:  Vision Screening: Recommended annual ophthalmology exams for early detection of glaucoma and other disorders of the eye. Would you like a referral to an eye doctor? No    Dental Screening: Recommended annual dental exams for proper oral hygiene  Community Resource Referral / Chronic Care Management: CRR required this visit?  No   CCM required this visit?  No   Plan:    I have personally reviewed and noted the following in the patient's chart:   Medical and social history Use of alcohol, tobacco or illicit drugs  Current medications and supplements including opioid prescriptions. Patient is not currently taking opioid prescriptions. Functional ability and status Nutritional status Physical activity Advanced directives List  of other physicians Hospitalizations, surgeries, and ER visits in previous 12 months Vitals Screenings to include cognitive, depression, and falls Referrals and appointments  In addition, I have reviewed and  discussed with patient certain preventive protocols, quality metrics, and best practice recommendations. A written personalized care plan for preventive services as well as general preventive health recommendations were provided to patient.   Rojelio LELON Blush, LPN   [id number]   After Visit Summary: (MyChart) Due to this being a telephonic visit, the after visit summary with patients personalized plan was offered to patient via MyChart   Notes: Nothing significant to report at this time.

## 2024-07-05 ENCOUNTER — Other Ambulatory Visit: Payer: Self-pay | Admitting: Family Medicine

## 2024-07-05 ENCOUNTER — Encounter: Payer: Self-pay | Admitting: *Deleted

## 2024-07-05 DIAGNOSIS — F5102 Adjustment insomnia: Secondary | ICD-10-CM

## 2024-07-10 NOTE — Progress Notes (Addendum)
 [facility] [address], [address] [place], [place] [address] [phone]  Date:  07/12/2024   Name:  Nicole Mccann   DOB:  [birth year]   MRN:  [id number]  PCP:  Watt Harlene BROCKS, MD    Chief Complaint: No chief complaint on file.   History of Present Illness:  Nicole Mccann is a 78 y.o. very pleasant female patient who presents with the following:  Patient seen today for medication follow-up.  I saw her most recently in February-at that time her husband had recently died, they were married for 60 years  History of controlled diabetes with polyneuropathy, obesity, hyperlipidemia, seizure disorder, sleep apnea on CPAP, venous insufficiency with swelling of bilateral lower extremities, asthma  Occasional lasix  use  Discussed the use of AI scribe software for clinical note transcription with the patient, who gave verbal consent to proceed.  History of Present Illness    Patient Active Problem List   Diagnosis Date Noted   Umbilical hernia 04/05/2023   Open wound of skin 12/22/2022   Class 3 obesity with alveolar hypoventilation, serious comorbidity, and body mass index (BMI) of 50.0 to 59.9 in adult (HCC) 08/10/2022   Gait disorder 08/10/2022   SOB (shortness of breath) 05/12/2021   Diastolic dysfunction 05/12/2021   Chronic diastolic heart failure (HCC) 05/12/2021   Morbid obesity (HCC) 05/12/2021   PSVT (paroxysmal supraventricular tachycardia) (HCC) 05/12/2021   Hypertriglyceridemia 05/12/2021   Diabetes mellitus due to underlying condition with hyperosmolarity without coma, without long-term current use of insulin  (HCC) 05/12/2021   Olfactory aura    Low blood pressure    Hyperlipidemia    Depression    Anxiety    Allergy    Bilateral headaches 12/09/2020   Mixed hyperlipidemia 07/10/2019   Dizziness and giddiness 12/22/2016   Osteopenia 09/21/2016   Low back pain 05/26/2016   Right shoulder pain  04/04/2015   Adjustment disorder with mixed anxiety and depressed mood 03/14/2015   Rotator cuff (capsule) sprain 03/13/2015   Pseudoptosis 11/18/2014   Seizure disorder, temporal lobe, intractable (HCC) 10/17/2014   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/2014   Severe obesity (BMI >= 40) (HCC) 10/17/2014   OSA on CPAP 10/17/2014   Hypersomnia with sleep apnea 05/22/2014   Injury of left knee 08/22/2013   Shingles 07/2013   Migraine variant 05/22/2013   Disturbances of sensation of smell and taste 05/22/2013   Spinal stenosis of lumbar region 12/17/2012   Pulmonary embolism (HCC) 07/09/2012   DVT (deep venous thrombosis) (HCC) 07/07/2012   Atypical seizure (HCC) 06/07/2012   Phlebitis following infusion 06/05/2012   BENIGN POSITIONAL VERTIGO 11/06/2009   Type 2 diabetes, controlled, with peripheral neuropathy (HCC) 04/04/2009   Allergic rhinitis 04/04/2009   ASTHMA 04/04/2009   Diverticulosis of colon 04/04/2009    Past Medical History:  Diagnosis Date   Adjustment disorder with mixed anxiety and depressed mood 03/14/2015   Dr Lucyann actively treating [place]    ALLERGIC RHINITIS 04/04/2009   Qualifier: Diagnosis of  By: Tish MD, Elsie     Allergy    Anxiety    Dr Lucyann   Arthritis    Asthma    ASTHMA 04/04/2009   Qualifier: Diagnosis of  By: Tish MD, [name]   Status Asthmaticus 10/2004     Atypical seizure (HCC) 06/07/2012   BENIGN POSITIONAL VERTIGO 11/06/2009   Qualifier: Diagnosis of  By: Alvan MD, Dorothyann     Cataract  Complication of anesthesia    Depression    Dr Lucyann   Diabetes mellitus    Disturbances of sensation of smell and taste 05/22/2013   Diverticulosis of colon 04/04/2009   Qualifier: Diagnosis of  By: Tish MD, Elsie     Dizziness and giddiness 12/22/2016   DVT (deep venous thrombosis) (HCC)    Dyspnea    Headache(784.0)    Hyperlipidemia    Hypersomnia with sleep apnea, unspecified 05/22/2014   Injury of left knee 08/22/2013    08[place]15/13 [place] contusion and abrasion of left knee. 08/17/13 seen in the emergency room; imaging negative. No labs. Referred to Dr. Cleatrice who recommended nonsteroidals and elevation. 10/18-11/3/14 in [place] for land and river cruise trip.  08/29/13 seen in ER in [place]; topical antibiotics & oral Ciprobay Rxed. 10/23 at second ER I&D declined ; wound   Low back pain 05/26/2016   Low blood pressure    Mixed hyperlipidemia 07/10/2019   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/2014   Olfactory aura    OSA on CPAP    Osteopenia 09/21/2016   dexa 09/2016   Phlebitis following infusion 06/05/2012   Onset 06/01/12;S/P Dilantin infusion 05/30/12 @ [facility] for atypical seizure activity with response 06/05/12 DVT of Cephalic vein elbow- mid upper arm. Xarelto  15 mg initiated 06/05/12 by Dr Garrick , [facility] and increased to one pill twice a day. Despite this she had persistent thrombosis in the cephalic vein in the elbow and the upper forearm areas prompting change to Lovenox  110 mg e   Pulmonary embolism (HCC) 07/09/2012   Presumed based on intermediate VQ 07/07/2012 and high clinical probability.  Definitive CT angiogram was not possible due to contrast  intolerance/allergy    Right shoulder pain 04/04/2015   Rotator cuff (capsule) sprain 03/13/2015   Seizure disorder, temporal lobe, intractable (HCC) 10/17/2014   Severe obesity (BMI >= 40) (HCC) 10/17/2014   Shingles 07/2013   Sleep apnea    Spinal stenosis of lumbar region 12/17/2012   S/P suboptimal response to Chiropractry ;Dr Toni referred her  to Dr Cordella [facility]annial [facility] [phone]; FAX [phone] MRI 12/15/12 : stenosis @ L3-4; L 4-5  in context of DDD ESI recommended . 12/17/12 Note: I recommended discussion with [name],Neurologist; & Dr Bonnie Gaskins, Diabetologist    Type 2 diabetes, controlled, with peripheral neuropathy (HCC) 04/04/2009   Qualifier: Diagnosis of  By:  Tish MD, Elsie   Dr Bonnie Gaskins, Endo Dr Robinson , Ophth seen anually     Variants of migraine, not elsewhere classified, without mention of intractable migraine without mention of status migrainosus 05/22/2013    Past Surgical History:  Procedure Laterality Date   APPENDECTOMY     ARTERY BIOPSY Left 04/14/2018   Procedure: BIOPSY TEMPORAL ARTERY;  Surgeon: Harvey Carlin BRAVO, MD;  Location: [facility] OR;  Service: Vascular;  Laterality: Left;   BREAST EXCISIONAL BIOPSY Right 1998   CHOLECYSTECTOMY     COLONOSCOPY  2006   [facility]   INSERTION OF MESH N/A 04/05/2023   Procedure: INSERTION OF MESH;  Surgeon: Belinda Cough, MD;  Location: [facility] OR;  Service: General;  Laterality: N/A;   KNEE SURGERY Left    rotator cuff surgery     TONSILLECTOMY      UMBILICAL HERNIA REPAIR N/A 04/05/2023   Procedure: HERNIA REPAIR UMBILICAL ADULT WITH MESH;  Surgeon: Belinda Cough, MD;  Location: [facility] OR;  Service: General;  Laterality: N/A;  Social History   Tobacco Use   Smoking status: Never   Smokeless tobacco: Never  Vaping Use   Vaping status: Never Used  Substance Use Topics   Alcohol use: Yes    Alcohol/week: 3.0 standard drinks of alcohol    Types: 3 Glasses of wine per week    Comment: 1/2 glass wine daily   Drug use: No    Family History  Problem Relation Age of Onset   Stomach cancer Father    Coronary artery disease Father    Cancer Father    Obesity Father    Breast cancer Sister 20       breast cancer   Cancer Sister    Diabetes Maternal Aunt    Diabetes Paternal Aunt    Prostate cancer Brother    Cancer Brother    Obesity Brother    Cancer Daughter    Diabetes Daughter    Obesity Daughter    Obesity Daughter    Stroke Neg Hx     Allergies  Allergen Reactions   Mushroom Ext Cmplx(Shiitake-Reishi-Mait) Anaphylaxis   Shellfish Allergy Anaphylaxis   Sulfonamide Derivatives Anaphylaxis   Vancomycin  Anaphylaxis   Fluticasone -Salmeterol Other (See Comments)    Feels  like something in the throat   Butalbital-Aspirin-Caffeine Other (See Comments)    UNSPECIFIED REACTION    Keflex  [Cephalexin ]     Nausea/vomitting   Montelukast Sodium Other (See Comments)    Feels like she's running out of breath   Penicillins     UNSPECIFIED REACTION      Childhood allergy Has patient had a PCN reaction causing immediate rash, facial/tongue/throat swelling, SOB or lightheadedness with hypotension: Unknown Has patient had a PCN reaction causing severe rash involving mucus membranes or skin necrosis: Unknown Has patient had a PCN reaction that required hospitalization: No Has patient had a PCN reaction occurring within the last 10 years: No If all of the above answers are NO, then may proceed with Cephalosporin use.     Acetaminophen  Other (See Comments)    Stomach discomfort   Clarithromycin Nausea And Vomiting   Iodinated Contrast Media Other (See Comments)    Bad headache; no prep needed   Levofloxacin Diarrhea and Nausea And Vomiting    Dizziness Believes it was due to a high dosage of this medication   Orphenadrine Nausea And Vomiting    Medication list has been reviewed and updated.  Current Outpatient Medications on File Prior to Visit  Medication Sig Dispense Refill   albuterol  (VENTOLIN  HFA) 108 (90 Base) MCG/ACT inhaler Inhale 2 puffs into the lungs every 6 (six) hours as needed for wheezing or shortness of breath. 18 g 6   bacitracin  ointment Apply 1 Application topically 2 (two) times daily. (Patient not taking: Reported on 04/27/2024) 120 g 0   budesonide -formoterol  (SYMBICORT ) 160-4.5 MCG/ACT inhaler Inhale 2 puffs into the lungs 2 (two) times daily. (Patient taking differently: Inhale 2 puffs into the lungs in the morning.) 1 each 12   Calcium  Carb-Cholecalciferol (CALCIUM  600+D3 PO) Take 1 tablet by mouth in the morning.     carboxymethylcellulose (REFRESH TEARS) 0.5 % SOLN Place 1-2 drops into both eyes 3 (three) times daily as needed  (dry/irritated eyes.).     cetirizine (ZYRTEC) 10 MG tablet Take 10 mg by mouth at bedtime.     Cholecalciferol (VITAMIN D) 50 MCG (2000 UT) CAPS Take 2,000 Units by mouth in the morning.     dapagliflozin  propanediol (FARXIGA ) 10 MG TABS tablet Take  1 tablet (10 mg total) by mouth daily before breakfast. 90 tablet 3   diclofenac  Sodium (VOLTAREN  ARTHRITIS PAIN) 1 % GEL Apply 1 Application topically 4 (four) times daily as needed (pain.).     FLUoxetine  (PROZAC ) 20 MG capsule TAKE 1 CAPSULE BY MOUTH DAILY 90 capsule 3   furosemide  (LASIX ) 20 MG tablet Take 1-2 tablets (20-40 mg total) by mouth daily. Use as needed for lower extremity swelling 40 tablet 3   gabapentin  (NEURONTIN ) 100 MG capsule TAKE 1 CAPSULE BY MOUTH 3 TIMES  DAILY 270 capsule 3   ibuprofen  (ADVIL ) 200 MG tablet Take 400-600 mg by mouth every 8 (eight) hours as needed (pain.).     lidocaine  (LIDO KING) 4 % Place 1 patch onto the skin daily as needed (pain.).     loratadine  (CLARITIN ) 10 MG tablet Take 10 mg by mouth in the morning.     mirabegron  ER (MYRBETRIQ ) 50 MG TB24 tablet Take 1 tablet (50 mg total) by mouth daily. 90 tablet 3   Omega-3 Fatty Acids (FISH OIL) 1200 MG CAPS Take 1,200 mg by mouth in the morning.     Probiotic Product (DIGESTIVE ADVANTAGE PO) Take 1 capsule by mouth in the morning.     rizatriptan  (MAXALT ) 10 MG tablet Take 1 tablet (10 mg total) by mouth as needed for migraine. May repeat in 2 hours if needed (Patient taking differently: Take 10 mg by mouth daily as needed for migraine. May repeat in 2 hours if needed) 12 tablet 3   rosuvastatin  (CRESTOR ) 10 MG tablet Take 1 tablet (10 mg total) by mouth daily. 90 tablet 3   simethicone (MYLICON) 125 MG chewable tablet Chew 125-250 mg by mouth at bedtime as needed for flatulence.     Sodium Chloride -Sodium Bicarb (AYR SALINE NASAL RINSE NA) Place into the nose every other day.     tirzepatide  (MOUNJARO ) 7.5 MG/0.5ML Pen Inject 7.5 mg into the skin once a week.  6 mL 3   traZODone  (DESYREL ) 50 MG tablet TAKE 0.5-1 TABLETS BY MOUTH AT BEDTIME AS NEEDED FOR SLEEP. 90 tablet 0   No current facility-administered medications on file prior to visit.    Review of Systems:  ***  Physical Examination: There were no vitals filed for this visit. There were no vitals filed for this visit. There is no height or weight on file to calculate BMI. Ideal Body Weight:    ***  Assessment and Plan: No diagnosis found.  Assessment & Plan   Signed Harlene Schroeder, MD

## 2024-07-12 ENCOUNTER — Ambulatory Visit: Admitting: Family Medicine

## 2024-07-12 ENCOUNTER — Encounter: Payer: Self-pay | Admitting: Family Medicine

## 2024-07-12 VITALS — BP 120/74 | HR 77 | Temp 97.9°F | Ht 60.0 in | Wt 258.2 lb

## 2024-07-12 DIAGNOSIS — F339 Major depressive disorder, recurrent, unspecified: Secondary | ICD-10-CM

## 2024-07-12 DIAGNOSIS — E782 Mixed hyperlipidemia: Secondary | ICD-10-CM

## 2024-07-12 DIAGNOSIS — Z23 Encounter for immunization: Secondary | ICD-10-CM

## 2024-07-12 DIAGNOSIS — E1142 Type 2 diabetes mellitus with diabetic polyneuropathy: Secondary | ICD-10-CM | POA: Diagnosis not present

## 2024-07-12 DIAGNOSIS — Z7985 Long-term (current) use of injectable non-insulin antidiabetic drugs: Secondary | ICD-10-CM | POA: Diagnosis not present

## 2024-07-12 DIAGNOSIS — Z1329 Encounter for screening for other suspected endocrine disorder: Secondary | ICD-10-CM | POA: Diagnosis not present

## 2024-07-12 DIAGNOSIS — R5383 Other fatigue: Secondary | ICD-10-CM

## 2024-07-12 MED ORDER — TIRZEPATIDE 10 MG/0.5ML ~~LOC~~ SOAJ: 10.0000 mg | SUBCUTANEOUS | 2 refills | Status: AC

## 2024-07-12 NOTE — Patient Instructions (Addendum)
 It was good to see you today, I will be in touch with your labs as soon as possible  Flu shot today Also recommend a COVID booster this fall  We will try going up to 10 mg mounjaro  - let me know if your stomach symptoms are worse!  You might try imodium if needed for diarrhea   Please reach out to our counseling dept and set up an appointment to discuss grief and coping  Address: [address] [place], [place] [address] Phone: [phone]

## 2024-07-13 ENCOUNTER — Encounter: Payer: Self-pay | Admitting: Family Medicine

## 2024-07-13 DIAGNOSIS — J4520 Mild intermittent asthma, uncomplicated: Secondary | ICD-10-CM

## 2024-07-13 DIAGNOSIS — F339 Major depressive disorder, recurrent, unspecified: Secondary | ICD-10-CM

## 2024-07-13 DIAGNOSIS — E1142 Type 2 diabetes mellitus with diabetic polyneuropathy: Secondary | ICD-10-CM

## 2024-07-13 LAB — COMPREHENSIVE METABOLIC PANEL WITH GFR
ALT: 20 U/L (ref 0–35)
AST: 20 U/L (ref 0–37)
Albumin: 4 g/dL (ref 3.5–5.2)
Alkaline Phosphatase: 49 U/L (ref 39–117)
BUN: 11 mg/dL (ref 6–23)
CO2: 28 meq/L (ref 19–32)
Calcium: 9.4 mg/dL (ref 8.4–10.5)
Chloride: 102 meq/L (ref 96–112)
Creatinine, Ser: 0.8 mg/dL (ref 0.40–1.20)
GFR: 69.52 mL/min (ref >60.00)
Glucose, Bld: 93 mg/dL (ref 70–99)
Potassium: 4.4 meq/L (ref 3.5–5.1)
Sodium: 140 meq/L (ref 135–145)
Total Bilirubin: 0.7 mg/dL (ref 0.2–1.2)
Total Protein: 6.7 g/dL (ref 6.0–8.3)

## 2024-07-13 LAB — CBC
HCT: 45.3 % (ref 36.0–46.0)
Hemoglobin: 15.2 g/dL — ABNORMAL HIGH (ref 12.0–15.0)
MCHC: 33.6 g/dL (ref 30.0–36.0)
MCV: 92.3 fl (ref 78.0–100.0)
Platelets: 187 K/uL (ref 150.0–400.0)
RBC: 4.91 Mil/uL (ref 3.87–5.11)
RDW: 13.5 % (ref 11.5–15.5)
WBC: 7.7 K/uL (ref 4.0–10.5)

## 2024-07-13 LAB — TSH: TSH: 3.14 u[IU]/mL (ref 0.35–5.50)

## 2024-07-13 LAB — HEMOGLOBIN A1C: Hgb A1c MFr Bld: 6.3 % (ref 4.6–6.5)

## 2024-07-26 MED ORDER — ALBUTEROL SULFATE HFA 108 (90 BASE) MCG/ACT IN AERS: 2.0000 | INHALATION_SPRAY | Freq: Four times a day (QID) | RESPIRATORY_TRACT | 6 refills | Status: AC | PRN

## 2024-07-26 MED ORDER — DAPAGLIFLOZIN PROPANEDIOL 10 MG PO TABS: 10.0000 mg | ORAL_TABLET | Freq: Every day | ORAL | 3 refills | Status: AC

## 2024-07-26 MED ORDER — GABAPENTIN 100 MG PO CAPS: 100.0000 mg | ORAL_CAPSULE | Freq: Three times a day (TID) | ORAL | 1 refills | Status: AC

## 2024-07-26 MED ORDER — FLUOXETINE HCL 20 MG PO CAPS: 20.0000 mg | ORAL_CAPSULE | Freq: Every day | ORAL | 3 refills | Status: AC

## 2024-07-26 MED ORDER — ROSUVASTATIN CALCIUM 10 MG PO TABS: 10.0000 mg | ORAL_TABLET | Freq: Every day | ORAL | 3 refills | Status: AC

## 2024-07-26 MED ORDER — BUDESONIDE-FORMOTEROL FUMARATE 160-4.5 MCG/ACT IN AERO: 2.0000 | INHALATION_SPRAY | Freq: Every morning | RESPIRATORY_TRACT | 12 refills | Status: AC

## 2024-08-09 ENCOUNTER — Ambulatory Visit: Admitting: Neurology

## 2024-08-13 ENCOUNTER — Ambulatory Visit (INDEPENDENT_AMBULATORY_CARE_PROVIDER_SITE_OTHER): Admitting: Clinical

## 2024-08-13 ENCOUNTER — Encounter: Payer: Self-pay | Admitting: Clinical

## 2024-08-13 DIAGNOSIS — Z0389 Encounter for observation for other suspected diseases and conditions ruled out: Secondary | ICD-10-CM

## 2024-08-13 NOTE — Progress Notes (Signed)
 [facility] Counselor Adult Comprehensive Clinical Assessment - TELEMEDICINE   Name: Nicole Mccann Date: 08/13/2024 MRN: [id number] DOB: 13-May-1946 PCP: Watt Harlene BROCKS, MD  Session Time start: 4:05pm End time: 4:20pm Total time: 15 min  Client[place]Family location: [place] Therapist location: [facility] [place] office - [place], [place] All persons participating in visit: Client, & this therapist  I connected with client and/or family via Video Enabled Telemedicine Application  (Video is Caregility application) and verified that I am speaking with the correct person using two identifiers. Discussed confidentiality: Yes   I informed client that this therapist will be unable to complete the video visit since they are not located in the state of [place].  And this clinician is not licensed in any other districts or states to provide psycho therapy.    They acknowledged understanding.  Types of Service: Video visit and Introduction only  No charge for this visit due to brief amount of time and client was not in the state of  .   Reason for referral in patient/family's own words:  Grief and relationship with her other daughter  Client likes to be called [name].  Primary language at home is [place] and [place].   Diagnosis: No diagnosis  Recommendations for Services/Supports/Treatments: Scheduled another new patient visit in-person in the office and follow up visits can be virtual.  [email or web address] - send to email address for video visits  Next appointment: 08/24/2024 2pm at [facility] P. Trudy, MSW, LCSW [employer] Therapist Main Office: [phone]

## 2024-08-24 ENCOUNTER — Ambulatory Visit: Admitting: Clinical

## 2024-08-24 DIAGNOSIS — F4323 Adjustment disorder with mixed anxiety and depressed mood: Secondary | ICD-10-CM

## 2024-08-24 NOTE — Progress Notes (Unsigned)
 [facility] Counselor Initial Adult In-Person - Comprehensive Clinical Assessment  Name: Nicole Mccann Date: 08/24/2024 MRN: [id number] DOB: 02/18/1946 PCP: Watt Harlene BROCKS, MD  Session Time start: 2:04 pm End time: 3pm Total time: 56 min  Types of Service: Comprehensive Clinical Assessment (CCA)  Guardian/Payee:  Self    Paperwork requested: No   Nicole Mccann participated from office with therapist and consented to treatment. We reviewed the limits of confidentiality prior to the start of the evaluation. Nicole Mccann expressed understanding and agreed to proceed.   Reason for referral in patient/family's own words:  Trying to cope with the death of her husband and daughter's health condition. Husband died December 08, 2023 Client likes to be called [name].  Primary language at home is [place] & Spanish.  SUBJECTIVE:  Risk Assessment: Danger to Self:  No Self-injurious Behavior: No Danger to Others: No Duty to Warn:no Physical Aggression / Violence:No  Access to Firearms a concern: Did not ask  Client and/or legal guardian was educated about steps to take if suicide or homicide risk level increases between visits: n/a While future psychiatric events cannot be accurately predicted, the patient does not currently require acute inpatient psychiatric care and does not currently meet [place]  involuntary commitment criteria.  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  - Enjoys [place] Network engineer system- adult daughter, friends to play card over the weekend  Current Health  Habits: Sleep:   Uses CPAP for last 2 years and eye mask to help with her sleep, still wakes up 2-3 times/night to use the bathroom, hard to go to sleep sometimes  Physical Activities: Difficulties with physical activities   Eating/Appetite: Takes insulin  shots- controlling how she eats, trying to eat slower since she use to eat too fast  Current Medications and therapies:  Psychotropic medications: Current medications: Fluoxetine  20 mg Client taking medications as prescribed:  Yes Side effects reported: No  Therapies:  None  Psychiatric Review of systems: Insomnia: Wakes up at night but sleeping better Changes in appetite: Due to diabetes Decreased need for sleep: No Hallucinations: No   Paranoia: No    Psychiatric History: Past psychiatry diagnosis: Depression Patient currently being seen by therapist/psychiatrist:  No Prior Suicide Attempts: No Past psychiatry Hospitalization(s): No Past history of violence: No  Social History:  Living situation: Lives with her youngest daughter Relationship status: Married when she was 78 yo, married for 60 years, almost 22 years - Married and left her country to be with him in the [place]  (lived in [place]) - Dated for a year, he was about 6 years older than her, the original plan was for her husband to study in the [place]  and come back to [place].  In [place]- she was married by proxy in a civil ceremony and then married in the church when she came to the [place] , moved to [place] after she got married. GLENWOOD Crate (husband) was an Art gallery manager, helped her with math -passing math exam - Involved in the [place] [employer] on the weekends, husband played soccer - Husband had parkinson's & dementia  Number of Children if applicable: 2 daughters (miscarried in between the  daughters at 5.5 months pregnant). During her pregnancy with her 2nd daughter she was on bed rest for a few months 1st daughter - lives in [place] in nursing home due to a stroke (5  yo) 2nd daughter lives between [place] & [place] since 2020 due to Covid 19 pandemic   Employment: Retired - Wellsite geologist Education: [employer] in [place] to have a degree- became an Wellsite geologist. Military history: No Legal History/Concerns: No Religious/spiritual beliefs or involvement: Catholic - important to her, went to see Niki Leech when he came to the [place]  (client's father was an Emergency planning/management officer)  Any cultural differences that may affect / interfere with treatment:  not applicable   Current or History of Alcohol/Substance use: Does client seem concerned about dependence or abuse of any substance? no  Traumatic Experiences/Abuse history: Have you ever been exposed, witnessed or experienced any form of abuse, what type?  None reported  Have you ever been exposed, witnessed or experienced something traumatic, describe?  None reported  Family of Origin (Childhood History) Both client & her husband is of [place] descent. Their families lived in [place] Was close to father in law and conflictual relationship with mother-in-law since he was the only son of 4 kids and they moved out of the country.  Client thinks she was similar to mother-in-law   Family History:  Family History  Problem Relation Age of Onset   Stomach cancer Father    Coronary artery disease Father    Cancer Father    Obesity Father    Breast cancer Sister 11       breast cancer   Cancer Sister    Diabetes Maternal Aunt    Diabetes Paternal Aunt    Prostate cancer Brother    Cancer Brother    Obesity Brother    Cancer Daughter    Diabetes Daughter    Obesity Daughter    Obesity Daughter    Stroke Neg Hx      Client Medical history  Medical History/Surgical History: reviewed' Past Medical History:  Diagnosis Date   Adjustment disorder with mixed anxiety and depressed mood 03/14/2015   Dr Lucyann actively treating Johnson    ALLERGIC RHINITIS 04/04/2009   Qualifier: Diagnosis of   By: Tish MD, Elsie     Allergy    Anxiety    Dr Lucyann   Arthritis    Asthma    ASTHMA 04/04/2009   Qualifier: Diagnosis of  By: Tish MD, [name]   Status Asthmaticus 10/2004     Atypical seizure (HCC) 06/07/2012   BENIGN POSITIONAL VERTIGO 11/06/2009   Qualifier: Diagnosis of  By: Alvan MD, Dorothyann     Cataract    Complication of anesthesia    Depression    Dr Lucyann   Diabetes mellitus    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 08/17/13 seen in the emergency room; imaging negative. No labs. Referred to Dr. Cleatrice who recommended nonsteroidals and elevation. 10/18-11/3/14 in [place] for land and river cruise trip.  08/29/13  seen in ER in [place]; topical antibiotics & oral Ciprobay Rxed. 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 Xarelto  15 mg initiated 06/05/12 by Dr Garrick , [facility] and increased to one pill twice a day. Despite this she had persistent thrombosis in the cephalic vein in the elbow and the upper forearm areas prompting change to Lovenox  110 mg e   Pulmonary embolism (HCC) 07/09/2012   Presumed based on intermediate VQ 07/07/2012 and high clinical  probability.  Definitive CT angiogram was not possible due to contrast  intolerance/allergy    Right shoulder pain 04/04/2015   Rotator cuff (capsule) sprain 03/13/2015   Seizure disorder, temporal lobe, intractable (HCC) 10/17/2014   Severe obesity (BMI >= 40) (HCC) 10/17/2014   Shingles 07/2013   Sleep apnea    Spinal stenosis of lumbar region 12/17/2012   S/P suboptimal response to Chiropractry ;Dr Toni referred her  to Dr Cordella [facility]ann[facility] [phone]; FAX [phone] MRI 12/15/12 : stenosis @ L3-4; L 4-5  in context of DDD ESI recommended . 12/17/12 Note: I recommended discussion with [name],Neurologist; & Dr Bonnie Gaskins, Diabetologist    Type 2 diabetes, controlled, with peripheral neuropathy (HCC) 04/04/2009   Qualifier: Diagnosis of  By: Tish MD, Elsie   Dr Bonnie Gaskins, Endo Dr Robinson , Ophth seen anually     Variants of migraine, not elsewhere classified, without mention of intractable migraine without mention of status migrainosus 05/22/2013    Past Surgical History:  Procedure Laterality Date   APPENDECTOMY     ARTERY BIOPSY Left 04/14/2018   Procedure: BIOPSY TEMPORAL ARTERY;  Surgeon: Harvey Carlin BRAVO, MD;  Location: [facility] OR;  Service: Vascular;  Laterality: Left;   BREAST EXCISIONAL BIOPSY Right 1998   CHOLECYSTECTOMY     COLONOSCOPY  2006   [facility]   INSERTION OF MESH N/A 04/05/2023   Procedure: INSERTION OF MESH;  Surgeon: Belinda Cough, MD;  Location: [facility] OR;  Service: General;  Laterality: N/A;   KNEE SURGERY Left    rotator cuff surgery     TONSILLECTOMY      UMBILICAL HERNIA REPAIR N/A 04/05/2023   Procedure: HERNIA REPAIR UMBILICAL ADULT WITH MESH;  Surgeon: Belinda Cough, MD;  Location: [facility] OR;  Service: General;  Laterality: N/A;    Medications: Current Outpatient Medications  Medication Sig Dispense Refill   albuterol  (VENTOLIN  HFA) 108 (90 Base) MCG/ACT inhaler Inhale 2 puffs into the lungs every 6 (six) hours as needed for  wheezing or shortness of breath. 18 g 6   bacitracin  ointment Apply 1 Application topically 2 (two) times daily. (Patient not taking: Reported on 07/12/2024) 120 g 0   budesonide -formoterol  (SYMBICORT ) 160-4.5 MCG/ACT inhaler Inhale 2 puffs into the lungs in the morning. 10.2 g 12   Calcium  Carb-Cholecalciferol (CALCIUM  600+D3 PO) Take 1 tablet by mouth in the morning.     carboxymethylcellulose (REFRESH TEARS) 0.5 % SOLN Place 1-2 drops into both eyes 3 (three) times daily as needed (dry/irritated eyes.).     cetirizine (ZYRTEC) 10 MG tablet Take 10 mg  by mouth at bedtime.     Cholecalciferol (VITAMIN D) 50 MCG (2000 UT) CAPS Take 2,000 Units by mouth in the morning.     dapagliflozin  propanediol (FARXIGA ) 10 MG TABS tablet Take 1 tablet (10 mg total) by mouth daily before breakfast. 90 tablet 3   diclofenac  Sodium (VOLTAREN  ARTHRITIS PAIN) 1 % GEL Apply 1 Application topically 4 (four) times daily as needed (pain.).     FLUoxetine  (PROZAC ) 20 MG capsule Take 1 capsule (20 mg total) by mouth daily. 90 capsule 3   furosemide  (LASIX ) 20 MG tablet Take 1-2 tablets (20-40 mg total) by mouth daily. Use as needed for lower extremity swelling 40 tablet 3   gabapentin  (NEURONTIN ) 100 MG capsule Take 1 capsule (100 mg total) by mouth 3 (three) times daily. 270 capsule 1   ibuprofen  (ADVIL ) 200 MG tablet Take 400-600 mg by mouth every 8 (eight) hours as needed (pain.).     lidocaine  (LIDO KING) 4 % Place 1 patch onto the skin daily as needed (pain.).     loratadine  (CLARITIN ) 10 MG tablet Take 10 mg by mouth in the morning.     mirabegron  ER (MYRBETRIQ ) 50 MG TB24 tablet Take 1 tablet (50 mg total) by mouth daily. 90 tablet 3   Omega-3 Fatty Acids (FISH OIL) 1200 MG CAPS Take 1,200 mg by mouth in the morning.     Probiotic Product (DIGESTIVE ADVANTAGE PO) Take 1 capsule by mouth in the morning.     rizatriptan  (MAXALT ) 10 MG tablet Take 1 tablet (10 mg total) by mouth as needed for migraine. May repeat in 2  hours if needed (Patient taking differently: Take 10 mg by mouth daily as needed for migraine. May repeat in 2 hours if needed) 12 tablet 3   rosuvastatin  (CRESTOR ) 10 MG tablet Take 1 tablet (10 mg total) by mouth daily. 90 tablet 3   simethicone (MYLICON) 125 MG chewable tablet Chew 125-250 mg by mouth at bedtime as needed for flatulence.     Sodium Chloride -Sodium Bicarb (AYR SALINE NASAL RINSE NA) Place into the nose every other day.     tirzepatide  (MOUNJARO ) 10 MG/0.5ML Pen Inject 10 mg into the skin once a week. 6 mL 2   traZODone  (DESYREL ) 50 MG tablet TAKE 0.5-1 TABLETS BY MOUTH AT BEDTIME AS NEEDED FOR SLEEP. 90 tablet 0   No current facility-administered medications for this visit.    Allergies  Allergen Reactions   Mushroom Ext Cmplx(Shiitake-Reishi-Mait) Anaphylaxis   Shellfish Allergy Anaphylaxis   Sulfonamide Derivatives Anaphylaxis   Vancomycin  Anaphylaxis   Fluticasone -Salmeterol Other (See Comments)    Feels like something in the throat   Butalbital-Aspirin-Caffeine Other (See Comments)    UNSPECIFIED REACTION    Keflex  [Cephalexin ]     Nausea/vomitting   Montelukast Sodium Other (See Comments)    Feels like she's running out of breath   Penicillins     UNSPECIFIED REACTION      Childhood allergy Has patient had a PCN reaction causing immediate rash, facial/tongue/throat swelling, SOB or lightheadedness with hypotension: Unknown Has patient had a PCN reaction causing severe rash involving mucus membranes or skin necrosis: Unknown Has patient had a PCN reaction that required hospitalization: No Has patient had a PCN reaction occurring within the last 10 years: No If all of the above answers are NO, then may proceed with Cephalosporin use.     Acetaminophen  Other (See Comments)    Stomach discomfort   Clarithromycin Nausea And Vomiting   Iodinated  Contrast Media Other (See Comments)    Bad headache; no prep needed   Levofloxacin Diarrhea and Nausea And Vomiting     Dizziness Believes it was due to a high dosage of this medication   Orphenadrine Nausea And Vomiting     Interventions: Interventions utilized: This Clinician reviewed information on new patient paperwork, explained services, identified presenting concerns, explored goals and built rapport. Obtained additional information for comprehensive assessment and developed general plan of care.    Client Family Response:  Ms. Mellott shared a lot of information about herself and her family.  She reported she feels good talking about things.  She wants to continue with individual therapy.  Mental status exam:   General Appearance Siegfried:  Neat Eye Contact:  Good Motor Behavior:  Normal Speech:  Normal Level of Consciousness:  Alert Mood:  Anxious Affect:  Appropriate Anxiety Level:  Minimal Thought Process:  Coherent Thought Content:  WNL Perception:  Normal Judgment:  Good Insight:  Present   Clinical Assessment: Ms. Stegmann is a 78 yo female who has experienced various stressors and losses, including the death of her husband earlier this year.  Ms. Donathan would benefit from individual psycho therapy to learn additional coping strategies and process the loss of her husband through grief counseling.   Diagnosis: Adjustment disorder with mixed anxiety and depressed mood  Coordination of Care: Written progress or summary reports in medical chart   Recommendations for Services/Supports/Treatments: Outpatient individual psycho therapy   Follow up Plan: A follow-up was scheduled to create a more specific treatment plan and begin treatment. Therapist answered all questions during the evaluation and contact information was provided.   Kishia Shackett P. Trudy, MSW, LCSW [employer] Therapist Main Office: [phone]

## 2024-08-28 ENCOUNTER — Ambulatory Visit: Admitting: Clinical

## 2024-08-28 DIAGNOSIS — F4323 Adjustment disorder with mixed anxiety and depressed mood: Secondary | ICD-10-CM | POA: Diagnosis not present

## 2024-08-28 NOTE — Progress Notes (Unsigned)
 [facility] Counselor Progress Note - TELEMEDICINE VISIT  Patient ID: Nicole Mccann, MRN: [id number]    Date: 08/28/2024  Time Spent: 11:00am   - 11:58  : 58 Minutes  Types of Service: Individual psychotherapy and Video visit  Client and/or Legal Guardian location: Client's home - [place], [place] Therapist location: Remote work - [place], [place] All persons participating in visit: Client & this therapist  I connected with client and/or legal guardian via Engineer, civil (consulting)  (Video is Caregility application) and verified that I am speaking with the correct person using two identifiers. Discussed confidentiality: Yes   I discussed the limitations of telemedicine and the availability of in person appointments.  Discussed there is a possibility of technology failure and discussed alternative modes of communication if that failure occurs.  I discussed that engaging in this telemedicine visit, they consent to the provision of behavioral healthcare and the services will be billed under their insurance.  Client and/or legal guardian expressed understanding and consented to Telemedicine visit: Yes    Presenting Concerns:  - Taking care of things after her husband's death and trying to adjust without him in her life  Mental Status Exam: Appearance:  Well Groomed     Behavior: Appropriate  Motor: Normal  Speech/Language:  Normal Rate  Affect: Appropriate  Mood: sad  Thought process: normal  Thought content:   WNL  Sensory/Perceptual disturbances:   WNL  Orientation: oriented to person, place, time/date, and situation  Attention: Good  Concentration: Good  Memory: WNL  Fund of knowledge:  Good  Insight:   Good  Judgment:  Good  Impulse Control: Good   Risk Assessment: Danger to Self:  No Self-injurious Behavior: No Danger to Others: No Duty to Warn:no   Subjective:  Nicole Mccann initially talked about increased anxiety with being around a lot of  people so she doesn't go out a lot, especially going to stores.  Nicole Mccann is planning to go out today to take care of things after her husband's death.  Interventions: Grief Therapy- Active listening & supportive counseling.  Client Response: Nicole Mccann reported she wants to have something to do each day, otherwise she doesn't feel good, eg arts/crafts projects. She is looking forward to a project that her daughter told her about, which is to make things for an upcoming church event.  Nicole Mccann reported she's lived in their home since 56. She started to talk about the things that were important to her husband and the memories she had with him.  She reported her husband had a lot of tools.  He was a Comptroller and had a shop to fix cars.  He also taught her jewelry making class how to use the tools correctly helped with improving their tools.  She reported that was one of the best times in her life.   Diagnosis:  Adjustment disorder with mixed anxiety and depressed mood   Goals, Assessment & Plan:   Nicole Mccann is sharing her thoughts & feelings about her situation.    Treatment Level 3-4 times a month, either Tele-Medicine or InPerson.  Modality - TeleMedicine - Video  Goal:  Client will process and express grief-related emotions in a safe and supportive environment.  Target Date: 08/24/2025  Progress: Ongoing     Goals: Identify and implement healthy coping strategies as client adjusts to the loss of her husband.  Target Date: 08/24/2025  Progress: Ongoing    Kenlyn Lose P. Trudy, MSW, LCSW [employer]  [facility] Therapist Main Office: [phone]

## 2024-09-04 ENCOUNTER — Ambulatory Visit (INDEPENDENT_AMBULATORY_CARE_PROVIDER_SITE_OTHER): Admitting: Clinical

## 2024-09-04 DIAGNOSIS — F4323 Adjustment disorder with mixed anxiety and depressed mood: Secondary | ICD-10-CM

## 2024-09-04 NOTE — Progress Notes (Unsigned)
  Behavioral Health Counselor Progress Note - TELEMEDICINE VISIT  Patient ID: Nicole Mccann, MRN: [id number]    Date: 09/04/2024  Time Spent: 3pm   - 3:46pm  : 46 Minutes  Types of Service: Individual psychotherapy and Video visit  Client and/or Legal Guardian location: Client's home -[place], [place] Therapist location: [facility], [place] All persons participating in visit: Client & this therapist  I connected with client and/or legal guardian via Video Enabled Telemedicine Application  (Video is Caregility application) and verified that I am speaking with the correct person using two identifiers. Discussed confidentiality: Yes   I discussed the limitations of telemedicine and the availability of in person appointments.  Discussed there is a possibility of technology failure and discussed alternative modes of communication if that failure occurs.  I discussed that engaging in this telemedicine visit, they consent to the provision of behavioral healthcare and the services will be billed under their insurance.  Client and/or legal guardian expressed understanding and consented to Telemedicine visit: Yes    Presenting Concerns:  - Grieving the loss of her husband and adjusting to daily life without him  Mental Status Exam: Appearance:  Casual     Behavior: Appropriate  Motor: Normal  Speech/Language:  Normal Rate  Affect: Appropriate  Mood: sad  Thought process: normal  Thought content:   WNL  Sensory/Perceptual disturbances:   WNL  Orientation: oriented to person, place, time/date, situation, and day of week  Attention: Good  Concentration: Good  Memory: WNL  Fund of knowledge:  Good  Insight:   Good  Judgment:  Good  Impulse Control: Good   Risk Assessment: Danger to Self:  No Self-injurious Behavior: No Danger to Others: No Duty to Warn:no   Subjective:  Nicole Mccann has to do things differently and depend more on her daughter since her  husband died.  She also misses him and the things he use to do for her.  Interventions: Grief Therapy  Client Response: Nicole Mccann was sewing by hand a dress for her daughter. She doesn't use a sewing machine anymore since her husband would help her with the sewing machine when she has problems with it.  She reported it's been 9 months since her husband died and his birthday is on [birth year]. She reported that her daughter is planning on something for them to do for his birthday. Nicole Mccann reported she would like to go to church since she does that on special occasions. 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 She continued to share what her husband taught her throughout the years about various things, especially about different types of tools.  Diagnosis:  Adjustment disorder with mixed anxiety and depressed mood   Goals, Assessment & Plan:   Continue psycho therapy to process her loss and adjust to current life situation.  Treatment Level 2-3 times a month  Modality - TeleMedicine - Video  Goal:  Client will process and express grief-related emotions in a safe and supportive environment. - Nicole Mccann appears to be more comfortable in sharing her thoughts & emotions during the visits.   Target Date: 08/24/2025  Progress: Ongoing      Goals: Identify and implement healthy coping strategies as client adjusts to the loss of her husband.  - Nicole Mccann  is sewing which she enjoys since she's making something for her daughter and it keeps her mind occupied throughout the day.  Target Date: 08/24/2025  Progress: Ongoing      Arnulfo Batson P. Trudy, MSW, LCSW [employer] Therapist Main Office: [phone]

## 2024-09-10 ENCOUNTER — Ambulatory Visit: Admitting: Clinical

## 2024-09-17 ENCOUNTER — Ambulatory Visit (INDEPENDENT_AMBULATORY_CARE_PROVIDER_SITE_OTHER): Admitting: Neurology

## 2024-09-17 ENCOUNTER — Ambulatory Visit: Admitting: Neurology

## 2024-09-17 ENCOUNTER — Encounter: Payer: Self-pay | Admitting: Neurology

## 2024-09-17 VITALS — BP 150/75 | HR 75 | Ht 60.0 in | Wt 258.0 lb

## 2024-09-17 DIAGNOSIS — G4733 Obstructive sleep apnea (adult) (pediatric): Secondary | ICD-10-CM | POA: Diagnosis not present

## 2024-09-17 DIAGNOSIS — R519 Headache, unspecified: Secondary | ICD-10-CM

## 2024-09-17 DIAGNOSIS — G40219 Localization-related (focal) (partial) symptomatic epilepsy and epileptic syndromes with complex partial seizures, intractable, without status epilepticus: Secondary | ICD-10-CM

## 2024-09-17 DIAGNOSIS — G43801 Other migraine, not intractable, with status migrainosus: Secondary | ICD-10-CM

## 2024-09-17 DIAGNOSIS — E1142 Type 2 diabetes mellitus with diabetic polyneuropathy: Secondary | ICD-10-CM

## 2024-09-17 DIAGNOSIS — Z7984 Long term (current) use of oral hypoglycemic drugs: Secondary | ICD-10-CM

## 2024-09-17 MED ORDER — RIZATRIPTAN BENZOATE 10 MG PO TABS: 10.0000 mg | ORAL_TABLET | ORAL | 0 refills | Status: DC | PRN

## 2024-09-17 MED ORDER — RIZATRIPTAN BENZOATE 10 MG PO TABS: 10.0000 mg | ORAL_TABLET | ORAL | 1 refills | Status: AC | PRN

## 2024-09-17 NOTE — Progress Notes (Signed)
 Provider:  Dedra Gores, MD  Primary Care Physician:  Watt Harlene BROCKS, MD [address] [place] [address]     Referring Provider: Watt Harlene BROCKS, Md [address] [place],  [place] [address]          Chief Complaint according to patient   Patient presents with:                HISTORY OF PRESENT ILLNESS:  Nicole Mccann is a 78 y.o. female patient who is here for revisit 09/17/2024 for   OSA on CPAP, but here to be seen  for left temporal head pain, described a pounding, throbbing, hot and a palpable hard nod can be felt. She is grieving her husband's death after 63 years of marriage. Insomnia is part of grief , trazodone  has been taken prn, has nocturia  2-3 times.   She still has the same risk factors of OSA , Obesity, neck size, upper airway.  She uses CPAP daily with an eye mask under the headgear.  New CPAP from 09/22/2022.    Chief concern according to patient :   sleeping poorly and having throbbing left temporal pain.   No recent sed rate or C reactive protein , CMET normal on 07-12-2024, CBC  hemoglobin elevated  ( since 03-2022) , Hba1c 6.3 ( good for her ) TSH 3.14 .  No myositis or dermatitis changes noted.  Giant cell arteritis ,        HPI:   06/21/2024 NP McCue  Nicole Mccann is a 78 y.o. female who is being followed in this office for OSA on CPAP.  Initially seen by Dr. Gores on 08/10/2022 with prior history of sleep apnea on CPAP machine no longer working appropriately.  Completed HST 11/7 which showed severe OSA with total AHI of 37.7/h.  Received new CPAP on 11/15.     Interval history:    Patient returns for follow-up visit via MyChart video visit.  She continues to do well with CPAP therapy.  Continues to tolerate well and reports continued benefit.  She does have high leak rate but she does not feel any leaks. ESS 8/24. Routinely followed by [facility].  No questions or concerns today.     Review of Systems: Out of a complete 14 system review, the patient complains of only the following symptoms, and all other reviewed systems are negative.:    Insomnia   SLEEPINESS ?  How likely are you to doze in the following situations: 0 = not likely, 1 = slight chance, 2 = moderate chance, 3 = high chance  Sitting and Reading? Watching Television? Sitting inactive in a public place (theater or meeting)? Lying down in the afternoon when circumstances permit? Sitting and talking to someone? Sitting quietly after lunch without alcohol? In a car, while stopped for a few minutes in traffic? As a passenger in a car for an hour without a break?  Total =  6/ 24  on CPAP   GDS 10/ 15 ,         Social History   Socioeconomic History   Marital status: Widowed January 2025     Spouse name: Curlee   Number of children: 2   Years of education: [employer] education level: Bachelor's degree (e.g., BA, AB, BS)  Occupational History   Not on file  Tobacco Use  Smoking status: Never   Smokeless tobacco: Never  Vaping Use   Vaping status: Never Used  Substance and Sexual Activity   Alcohol use: Yes    Alcohol/week: 3.0 standard drinks of alcohol    Types: 3 Glasses of wine per week    Comment: 1/2 glass wine daily   Drug use: No   Sexual activity: Never  Other Topics Concern   Not on file  Social History Narrative   Patient is married [place]) and lives at home with her husband.   Patient has two adult children.   Patient is retired.   Patient has a college education.   Patient is right-handed.   Patient drinks one cup of tea daily.   Social Drivers of Health   Financial Resource Strain: Medium Risk (06/26/2024)   Overall Financial Resource Strain (CARDIA)    Difficulty of Paying Living Expenses: Somewhat hard  Food Insecurity: No Food Insecurity (06/26/2024)   Hunger Vital Sign    Worried About Running Out of Food in the Last Year: Never true    Ran Out of  Food in the Last Year: Never true  Transportation Needs: No Transportation Needs (06/26/2024)   PRAPARE - Administrator, Civil Service (Medical): No    Lack of Transportation (Non-Medical): No  Physical Activity: Inactive (06/26/2024)   Exercise Vital Sign    Days of Exercise per Week: 0 days    Minutes of Exercise per Session: Not on file  Stress: Stress Concern Present (06/26/2024)   [employer] of Occupational Health - Occupational Stress Questionnaire    Feeling of Stress: Rather much  Social Connections: Socially Isolated (06/26/2024)   Social Connection and Isolation Panel    Frequency of Communication with Friends and Family: Never    Frequency of Social Gatherings with Friends and Family: Once a week    Attends Religious Services: Never    Database Administrator or Organizations: Yes    Attends Banker Meetings: 1 to 4 times per year    Marital Status: Widowed    Family History  Problem Relation Age of Onset   Stomach cancer Father    Coronary artery disease Father    Cancer Father    Obesity Father    Breast cancer Sister 70       breast cancer   Cancer Sister    Diabetes Maternal Aunt    Diabetes Paternal Aunt    Prostate cancer Brother    Cancer Brother    Obesity Brother    Cancer Daughter    Diabetes Daughter    Obesity Daughter    Obesity Daughter    Stroke Neg Hx     Past Medical History:  Diagnosis Date   Adjustment disorder with mixed anxiety and depressed mood 03/14/2015   Dr Lucyann actively treating [facility]    ALLERGIC RHINITIS 04/04/2009   Qualifier: Diagnosis of  By: Tish MD, Elsie     Allergy    Anxiety    Dr Lucyann   Arthritis    Asthma    ASTHMA 04/04/2009   Qualifier: Diagnosis of  By: Tish MD, [name]   Status Asthmaticus 10/2004     Atypical seizure (HCC) 06/07/2012   BENIGN POSITIONAL VERTIGO 11/06/2009   Qualifier: Diagnosis of  By: Alvan MD, Catherine     Cataract    Complication of anesthesia     Depression    Dr Lucyann   Diabetes mellitus    Disturbances of sensation  of smell and taste 05/22/2013   Diverticulosis of colon 04/04/2009   Qualifier: Diagnosis of  By: Tish MD, Elsie     Dizziness and giddiness 12/22/2016   DVT (deep venous thrombosis) (HCC)    Dyspnea    Headache(784.0)    Hyperlipidemia    Hypersomnia with sleep apnea, unspecified 05/22/2014   Injury of left knee 08/22/2013   [place]15[place]13 [place] contusion and abrasion of left knee. 08/17/13 seen in the emergency room; imaging negative. No labs. Referred to Dr. Cleatrice who recommended nonsteroidals and elevation. 10/18-11/3/14 in [place] for land and river cruise trip.  08/29/13 seen in ER in [place]; topical antibiotics & oral Ciprobay Rxed. 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 Xarelto  15 mg initiated 06/05/12 by Dr Garrick , [facility] and increased to one pill twice a day. Despite this she had persistent thrombosis in the cephalic vein in the elbow and the upper forearm areas prompting change to Lovenox  110 mg e   Pulmonary embolism (HCC) 07/09/2012   Presumed based on intermediate VQ 07/07/2012 and high clinical probability.  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 12/17/12 Note: I recommended discussion with [name],Neurologist; & Dr Bonnie Gaskins, Diabetologist    Type 2 diabetes, controlled, with peripheral neuropathy (HCC) 04/04/2009   Qualifier: Diagnosis of  By: Tish MD, Elsie   Dr Bonnie Gaskins, Endo Dr Robinson , Ophth seen anually     Variants of migraine, not elsewhere classified, without mention of intractable migraine without mention of status migrainosus 05/22/2013    Past Surgical History:  Procedure Laterality Date   APPENDECTOMY     ARTERY BIOPSY Left 04/14/2018   Procedure: BIOPSY TEMPORAL ARTERY;  Surgeon: Harvey Carlin BRAVO, MD;  [facility]: [facility];  Service: Vascular;  Laterality: Left;   BREAST EXCISIONAL BIOPSY Right 1998   CHOLECYSTECTOMY     COLONOSCOPY  2006   [facility]   INSERTION OF MESH N/A 04/05/2023   Procedure: INSERTION OF MESH;  Surgeon: Belinda Cough, MD;  Location: [facility] OR;  Service: General;  Laterality: N/A;   KNEE SURGERY Left    rotator cuff surgery     TONSILLECTOMY      UMBILICAL HERNIA REPAIR N/A 04/05/2023   Procedure: HERNIA REPAIR UMBILICAL ADULT WITH MESH;  Surgeon: Belinda Cough, MD;  Location: [facility] OR;  Service: General;  Laterality: N/A;     Current Outpatient Medications on File Prior to Visit  Medication Sig Dispense Refill   albuterol  (VENTOLIN  HFA) 108 (  90 Base) MCG/ACT inhaler Inhale 2 puffs into the lungs every 6 (six) hours as needed for wheezing or shortness of breath. 18 g 6   budesonide -formoterol  (SYMBICORT ) 160-4.5 MCG/ACT inhaler Inhale 2 puffs into the lungs in the morning. 10.2 g 12   Calcium  Carb-Cholecalciferol (CALCIUM  600+D3 PO) Take 1 tablet by mouth in the morning.     carboxymethylcellulose (REFRESH TEARS) 0.5 % SOLN Place 1-2 drops into both eyes 3 (three) times daily as needed  (dry/irritated eyes.).     cetirizine (ZYRTEC) 10 MG tablet Take 10 mg by mouth at bedtime.     Cholecalciferol (VITAMIN D) 50 MCG (2000 UT) CAPS Take 2,000 Units by mouth in the morning.     dapagliflozin  propanediol (FARXIGA ) 10 MG TABS tablet Take 1 tablet (10 mg total) by mouth daily before breakfast. 90 tablet 3   FLUoxetine  (PROZAC ) 20 MG capsule Take 1 capsule (20 mg total) by mouth daily. 90 capsule 3   furosemide  (LASIX ) 20 MG tablet Take 1-2 tablets (20-40 mg total) by mouth daily. Use as needed for lower extremity swelling 40 tablet 3   gabapentin  (NEURONTIN ) 100 MG capsule Take 1 capsule (100 mg total) by mouth 3 (three) times daily. 270 capsule 1   ibuprofen  (ADVIL ) 200 MG tablet Take 400-600 mg by mouth every 8 (eight) hours as needed (pain.).     lidocaine  (LIDO KING) 4 % Place 1 patch onto the skin daily as needed (pain.).     loratadine  (CLARITIN ) 10 MG tablet Take 10 mg by mouth in the morning.     Omega-3 Fatty Acids (FISH OIL) 1200 MG CAPS Take 1,200 mg by mouth in the morning.     Probiotic Product (DIGESTIVE ADVANTAGE PO) Take 1 capsule by mouth in the morning.     rizatriptan  (MAXALT ) 10 MG tablet Take 1 tablet (10 mg total) by mouth as needed for migraine. May repeat in 2 hours if needed (Patient taking differently: Take 10 mg by mouth daily as needed for migraine. May repeat in 2 hours if needed) 12 tablet 3   rosuvastatin  (CRESTOR ) 10 MG tablet Take 1 tablet (10 mg total) by mouth daily. 90 tablet 3   simethicone (MYLICON) 125 MG chewable tablet Chew 125-250 mg by mouth at bedtime as needed for flatulence.     Sodium Chloride -Sodium Bicarb (AYR SALINE NASAL RINSE NA) Place into the nose every other day.     tirzepatide  (MOUNJARO ) 10 MG/0.5ML Pen Inject 10 mg into the skin once a week. 6 mL 2   No current facility-administered medications on file prior to visit.    Allergies  Allergen Reactions   Mushroom Ext Cmplx(Shiitake-Reishi-Mait) Anaphylaxis   Shellfish Allergy  Anaphylaxis   Sulfonamide Derivatives Anaphylaxis   Vancomycin  Anaphylaxis   Fluticasone -Salmeterol Other (See Comments)    Feels like something in the throat   Butalbital-Aspirin-Caffeine Other (See Comments)    UNSPECIFIED REACTION    Keflex  [Cephalexin ]     Nausea/vomitting   Montelukast Sodium Other (See Comments)    Feels like she's running out of breath   Penicillins     UNSPECIFIED REACTION      Childhood allergy Has patient had a PCN reaction causing immediate rash, facial/tongue/throat swelling, SOB or lightheadedness with hypotension: Unknown Has patient had a PCN reaction causing severe rash involving mucus membranes or skin necrosis: Unknown Has patient had a PCN reaction that required hospitalization: No Has patient had a PCN reaction occurring within the last 10 years: No If all of  the above answers are NO, then may proceed with Cephalosporin use.     Acetaminophen  Other (See Comments)    Stomach discomfort   Clarithromycin Nausea And Vomiting   Iodinated Contrast Media Other (See Comments)    Bad headache; no prep needed   Levofloxacin Diarrhea and Nausea And Vomiting    Dizziness Believes it was due to a high dosage of this medication   Orphenadrine Nausea And Vomiting     DIAGNOSTIC DATA (LABS, IMAGING, TESTING) - I reviewed patient records, labs, notes, testing and imaging myself where available.  Lab Results  Component Value Date   WBC 7.7 07/12/2024   HGB 15.2 (H) 07/12/2024   HCT 45.3 07/12/2024   MCV 92.3 07/12/2024   PLT 187.0 07/12/2024      Component Value Date/Time   NA 140 07/12/2024 1614   NA 141 01/21/2016 0000   K 4.4 07/12/2024 1614   CL 102 07/12/2024 1614   CO2 28 07/12/2024 1614   GLUCOSE 93 07/12/2024 1614   BUN 11 07/12/2024 1614   BUN 16 01/21/2016 0000   CREATININE 0.80 07/12/2024 1614   CALCIUM  9.4 07/12/2024 1614   PROT 6.7 07/12/2024 1614   ALBUMIN  4.0 07/12/2024 1614   AST 20 07/12/2024 1614   ALT 20 07/12/2024 1614    ALKPHOS 49 07/12/2024 1614   BILITOT 0.7 07/12/2024 1614   GFRNONAA >60 04/01/2023 0838   GFRAA >60 11/09/2017 1447   Lab Results  Component Value Date   CHOL 143 04/27/2024   HDL 51 04/27/2024   LDLCALC 71 04/27/2024   LDLDIRECT 92.0 09/05/2020   TRIG 131 04/27/2024   CHOLHDL 2.8 04/27/2024   Lab Results  Component Value Date   HGBA1C 6.3 07/12/2024   No results found for: VITAMINB12 Lab Results  Component Value Date   TSH 3.14 07/12/2024    PHYSICAL EXAM:  Vitals:   09/17/24 1413  BP: (!) 150/75  Pulse: 75   No data found. Body mass index is 50.39 kg/m.   Wt Readings from Last 3 Encounters:  09/17/24 258 lb (117 kg)  07/12/24 258 lb 3.2 oz (117.1 kg)  06/26/24 256 lb (116.1 kg)     Ht Readings from Last 3 Encounters:  09/17/24 5' (1.524 m)  07/12/24 5' (1.524 m)  06/26/24 5' (1.524 m)      General: The patient is awake, alert and appears not in acute distress and groomed. Head: Normocephalic, atraumatic.  Neck is supple. Mallampati 3,  neck circumference:16 inches .   Nasal airflow is patent.    She is seated, not  in any evident distress, walks with walker, happy.. smiling and reporting that she has no new concerns.  Head: normocephalic and atraumatic.Oropharynx benign Neck: supple with no carotid  bruits Cardiovascular: regular rate and rhythm, no murmurs Mental Status: Awake and fully alert. Oriented to place and time.  Follows all commands. Speech and language normal.   Cranial Nerves: Fundoscopic exam with evidence of cataract-  Pupils equal, briskly reactive to light.  Extraocular movements full with coarse saccades, not nystagmus. Unable to follow in smooth pursuit. Visual fields - she reports trouble seeing the floor.  Hearing intact and symmetric to finger snap. Facial sensation intact.  Face, tongue, palate move normally and symmetrically.  Neck flexion and extension normal.  Motor: no rigor, no cogwheeling.  Sensory : loss of  vibration in toes. Diabetic neuropathy.  Tingling and pin and needle dyseasthesias.  Coordination: Rapid alternating movements normal in all extremities.  Finger-to-nose performed accurately bilaterally. Gait and Station: Arises from chair with some difficulty. Braces herself. Stance is wide-based, she ambulates with assistance but relays on a walker outside of the office.    Reflexes: attenuated and symmetric.   ASSESSMENT AND PLAN :   78 y.o. year old female  here with:    1)  OSA on CPAO, complaint and doing well   2) Insomnia related to depression , grief.   3) temporal pain seems not to be a symptom of Giant cell arteritis.  She feels the area is warm and  tense.   No jaw pain.  No myositis,      I suspect atypical migraines, will  order MRI brain with MRA.   Rizatriptan  to start for migraines.      I ordered a sed rate and CRP; level, CK CKMB>   RV in 6 months with NP.   I would like to thank Copland, Harlene BROCKS, MD and Copland, Harlene BROCKS, Md [address] [place],  [place] [address] for allowing me to meet with this pleasant patient.      The patient will be seen in follow-up in the sleep clinic at [facility] for discussion of test results, sleep related symptoms and treatment compliance review, further management strategies, etc.   The referring provider will be notified of the test results.   The patient's condition requires frequent monitoring and adjustments in the treatment plan, reflecting the ongoing complexity of care.  This provider is the continuing focal point for all needed services for this condition.  After spending a total time of  40  minutes face to face and time for  history taking, physical and neurologic examination, review of laboratory studies,  personal review of imaging studies, reports and results of other testing and review of referral information / records as far as provided in visit,   Electronically signed by: Dedra Gores,  MD 09/17/2024 2:26 PM  [facility] Board certified by The [employer] of Sleep Medicine and Diplomate of the [employer] of Sleep Medicine. Board certified In Neurology through the ABPN, Fellow of the [employer] of Neurology.

## 2024-09-18 ENCOUNTER — Telehealth: Payer: Self-pay | Admitting: Neurology

## 2024-09-18 LAB — CK TOTAL AND CKMB (NOT AT ARMC)
CK-MB Index: 1.7 ng/mL (ref 0.0–5.3)
Total CK: 57 U/L (ref 32–182)

## 2024-09-18 LAB — C-REACTIVE PROTEIN: CRP: 4 mg/L (ref 0–10)

## 2024-09-18 LAB — SEDIMENTATION RATE: Sed Rate: 16 mm/h (ref 0–40)

## 2024-09-18 NOTE — Telephone Encounter (Signed)
 no auth required sent to GI [phone]

## 2024-09-19 ENCOUNTER — Ambulatory Visit (INDEPENDENT_AMBULATORY_CARE_PROVIDER_SITE_OTHER): Admitting: Clinical

## 2024-09-19 DIAGNOSIS — F4323 Adjustment disorder with mixed anxiety and depressed mood: Secondary | ICD-10-CM | POA: Diagnosis not present

## 2024-09-19 NOTE — Progress Notes (Signed)
 [facility] Counselor Progress Note - TELEMEDICINE VISIT  Patient ID: Nicole Mccann, MRN: [id number]    Date: 09/19/2024  Time Spent: 4pm   - 4:59pm  : 59 min Minutes  Types of Service: Individual psychotherapy and Video visit  Client and/or Legal Guardian location: Client's home- [place], [place] Therapist location: [facility] [place], [place] All persons participating in visit: Client & this therapist  I connected with client and/or legal guardian via Video Enabled Telemedicine Application  (Video is Caregility application) and verified that I am speaking with the correct person using two identifiers. Discussed confidentiality: Yes   I discussed the limitations of telemedicine and the availability of in person appointments.  Discussed there is a possibility of technology failure and discussed alternative modes of communication if that failure occurs.  I discussed that engaging in this telemedicine visit, they consent to the provision of behavioral healthcare and the services will be billed under their insurance.  Client and/or legal guardian expressed understanding and consented to Telemedicine visit: Yes    Presenting Concerns:  Increased stress with a recent situation with her eldest daughter.  Mental Status Exam: Appearance:  Casual     Behavior: Appropriate  Motor: Restless  Speech/Language:  Normal Rate  Affect: Congruent and Tearful  Mood: angry and anxious  Thought process: normal  Thought content:   WNL  Sensory/Perceptual disturbances:   WNL  Orientation: oriented to person, place, time/date, situation, and day of week  Attention: Good  Concentration: Good  Memory: WNL  Fund of knowledge:  Good  Insight:   Good  Judgment:  Good  Impulse Control: Good   Risk Assessment: Danger to Self:  No Self-injurious Behavior: No Danger to Others: No Duty to Warn:no   Subjective:  Nicole Mccann reported increased stress with a recent situation  about her eldest daughter.  And then she shared a history of multiple situations about her eldest daughter that continues to be affect her today.  Interventions: Cognitive Behavioral Therapy - identified thoughts, emotions & actions, what she can & cannot control. Identified strengths and accomplishments as a parent.  Client Response: Nicole Mccann shared various feelings and questioning how her parenting may have affected the eldest daughter's decisions throughout her life.   Nicole Mccann has experienced multiple events that were stressful for the whole family since her eldest daughter was a teenager.  Her eldest daughter had a stroke a couple years ago and lives in a skilled nursing facility.  Nicole Mccann reported feeling better after talking about her feelings today and acknowledged her strengths as a parent for both children, even with the difficulties she experienced. Nicole Mccann did not have any extended family support or friends when her eldest daughter was born and she never took care of a baby in her entire life until her first child was born.   Diagnosis:  Adjustment disorder with mixed anxiety and depressed mood   Goals, Assessment & Plan:   [name] is experiencing increased stress and grief related emotions due recent situation with her eldest daughter.  Treatment Level 2-3 times a month  Modality - TeleMedicine - Video  Goal:  Client will process and express grief-related emotions in a safe and supportive environment. - [name] was able to verbalize more of her thoughts & feelings about her eldest daughter that brought up grief-related experiences.  At one point in their life, her eldest daughter was not part of the client's life, even when her husband became more sick.   [employer]  Date: 08/24/2025  Progress: Ongoing        Nicole Mccann P. Trudy, MSW, LCSW [employer] Therapist Main Office: [phone]

## 2024-09-21 ENCOUNTER — Ambulatory Visit: Payer: Self-pay | Admitting: Neurology

## 2024-09-26 ENCOUNTER — Ambulatory Visit: Admitting: Clinical

## 2024-10-09 ENCOUNTER — Encounter: Payer: Self-pay | Admitting: Internal Medicine

## 2024-10-09 ENCOUNTER — Ambulatory Visit: Admitting: Internal Medicine

## 2024-10-09 VITALS — BP 110/78 | Resp 18 | Ht 60.0 in | Wt 256.0 lb

## 2024-10-09 DIAGNOSIS — E1142 Type 2 diabetes mellitus with diabetic polyneuropathy: Secondary | ICD-10-CM

## 2024-10-09 DIAGNOSIS — Z7985 Long-term (current) use of injectable non-insulin antidiabetic drugs: Secondary | ICD-10-CM | POA: Diagnosis not present

## 2024-10-09 DIAGNOSIS — E782 Mixed hyperlipidemia: Secondary | ICD-10-CM

## 2024-10-09 DIAGNOSIS — Z7984 Long term (current) use of oral hypoglycemic drugs: Secondary | ICD-10-CM

## 2024-10-09 LAB — POCT GLYCOSYLATED HEMOGLOBIN (HGB A1C): Hemoglobin A1C: 5.6 % (ref 4.0–5.6)

## 2024-10-09 LAB — OPHTHALMOLOGY REPORT-SCANNED

## 2024-10-09 LAB — POCT GLUCOSE (DEVICE FOR HOME USE): POC Glucose: 190 mg/dL — AB (ref 70–99)

## 2024-10-09 NOTE — Patient Instructions (Addendum)
 Please continue: - Farxiga  10 mg before b'fast - Mounjaro  10 mg weekly  Please return in 4-6 months with your sugar log.

## 2024-10-09 NOTE — Addendum Note (Signed)
 Addended by: Mervyn Pflaum M on: 10/09/2024 02:41 PM   Modules accepted: Orders

## 2024-10-09 NOTE — Progress Notes (Signed)
 Patient ID: Nicole Mccann, female   DOB: 1946-09-18, 78 y.o.   MRN: [id number]   HPI: Nicole Mccann is a 78 y.o.-year-old female, presenting for follow-up for DM2, dx in initially in 1965 with GDM, then again GDM in 1974, non-insulin -dependent, controlled, with long-term complications (PN).  Last visit 6 months ago. Appointment is conducted along with her daughter who offers part of the history especially related to her blood sugars, diet, medications.  Interim history: No blurry vision, chest pain. She has stress incontinence.  She wears Depends. She continues to not be very mobile at home.  She has intermittent leg swelling.  She also has back pain. She has temporal headaches.  She sees Dr. Chalice.  She has OSA. She describes bloating after meals with Mounjaro . GasX and Mylanta are helping.  Reviewed HbA1c levels: Lab Results  Component Value Date   HGBA1C 6.3 07/12/2024   HGBA1C 6.4 01/04/2024   HGBA1C 7.1 (A) 05/30/2023   HGBA1C 7.2 (H) 04/01/2023   HGBA1C 6.4 (A) 11/26/2022   HGBA1C 6.9 (H) 09/08/2022   HGBA1C 6.7 (A) 06/04/2022   HGBA1C 6.7 (A) 01/28/2022   HGBA1C 6.8 (H) 09/09/2021   HGBA1C 6.5 11/26/2020   HGBA1C 6.8 (A) 09/05/2020   HGBA1C 6.6 (A) 05/02/2020   HGBA1C 6.3 (A) 01/09/2019   HGBA1C 5.8 (A) 09/05/2018   HGBA1C 6.3 (A) 04/18/2018   HGBA1C 6.4 12/12/2017   HGBA1C 6.2 02/09/2017   HGBA1C 6.1 01/21/2016   HGBA1C 6.0 (H) 07/08/2012   HGBA1C 6.1 (H) 07/07/2012   She was on: - Glipizide  XL 2.5-5 mg (approximately 40 minutes) before breakfast  SGLT2 inh were very expensive.  We had to stop Metformin ER due to significant diarrhea.  We changed to: - Farxiga  10 mg before b'fast - from PAP - Mounjaro  2.5 >> 5 >> 10 mg weekly (increased by PCP) Glipizide  ER was stopped when she started Mounjaro .  Pt checks her sugars once a day: - am: 140-150s >> 112, 130-140, 154 >> 112-138, 147>> 114-138 - 2h after b'fast: 186, 198 >> 120 >> 134 >> N/c - before  lunch: 130-150 >> 144-184 >> 120, 170  >> n/c  - 2h after lunch: 164, 185/225 >> n/c >> 140s >> n/c - before dinner: 114-146 >> 124 >> 140 >> n/c >> 101-114 >> 125 - 2h after dinner:184, 184/191 >> n/c >> <160 >> n/c >> 123-141 >> n/c - bedtime: n/c >> 149, 150 - nighttime: n/c Lowest sugar was 90 >> .SABRA.  101 >> 114; she has hypoglycemia awareness in the 70s. Highest: 225 >> .SABRA. 147 >> 138  Glucometer: One Touch Verio  Pt's meals are: - Breakfast: cereals, yoghurt (Activia), pastries, tea, honey >> f+ greek yoghurt + Kashi cereal - Lunch: salad, leftover sandwich, fruit - Dinner: meat, veggies, potato - Snacks: Sweets, sweet liquor Merrianne) >> stopped due to headaches  No CKD, last BUN/creatinine:  Lab Results  Component Value Date   BUN 11 07/12/2024   BUN 13 01/04/2024   CREATININE 0.80 07/12/2024   CREATININE 0.88 01/04/2024   Lab Results  Component Value Date   MICRALBCREAT 11.5 01/04/2024   MICRALBCREAT 14.0 02/13/2010   + HL; last set of lipids: Lab Results  Component Value Date   CHOL 143 04/27/2024   HDL 51 04/27/2024   LDLCALC 71 04/27/2024   LDLDIRECT 92.0 09/05/2020   TRIG 131 04/27/2024   CHOLHDL 2.8 04/27/2024  She refused statins in the past but we were able to start  rosuvastatin  10 mg daily, which she now tolerates well.  In 08/2020  we added fish oil 1 capsule of 1000 mg a day  - Last eye exam was on 09/29/2023: no DR, + cataracts. She will need palpebral lifting surgery and cataract sx. Has an appt coming up today.  - no numbness and tingling in her feet.  Last foot exam 04/27/2024. On Gabapentin .  She does have burning around her ankles and lower leg due to the leg swelling.  Pt has FH of DM in aunts.  She has OSA and is on a CPAP.  In summer 2019, she had hAs >> temporal artery Bx negative. She developed anaphylaxis to Vancomycin  while in the hospital.  ROS: + see HPI  Past Medical History:  Diagnosis Date   Adjustment disorder with mixed  anxiety and depressed mood 03/14/2015   Dr Lucyann actively treating [place]    ALLERGIC RHINITIS 04/04/2009   Qualifier: Diagnosis of  By: Tish MD, Elsie     Allergy    Anxiety    Dr Lucyann   Arthritis    Asthma    ASTHMA 04/04/2009   Qualifier: Diagnosis of  By: Tish MD, [name]   Status Asthmaticus 10/2004     Atypical seizure (HCC) 06/07/2012   BENIGN POSITIONAL VERTIGO 11/06/2009   Qualifier: Diagnosis of  By: Alvan MD, Dorothyann     Cataract    Complication of anesthesia    Depression    Dr Lucyann   Diabetes mellitus    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 08/17/13 seen in the emergency room; imaging negative. No labs. Referred to Dr. Cleatrice who recommended nonsteroidals and elevation. 10/18-11/3/14 in [place] for land and river cruise trip.  08/29/13 seen in ER in [place]; topical antibiotics & oral Ciprobay Rxed. 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 Xarelto  15 mg initiated 06/05/12 by Dr Garrick , [facility] and increased to one pill twice a day. Despite this she had persistent thrombosis in the cephalic vein in the elbow and the upper forearm areas  prompting change to Lovenox  110 mg e   Pulmonary embolism (HCC) 07/09/2012   Presumed based on intermediate VQ 07/07/2012 and high clinical probability.  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 12/17/12 Note: I recommended discussion with [name],Neurologist; & Dr Bonnie Gaskins, Diabetologist    Type 2 diabetes, controlled, with peripheral neuropathy (HCC) 04/04/2009   Qualifier: Diagnosis of  By: Tish MD, Elsie   Dr Bonnie Gaskins, Endo Dr Robinson , Ophth seen anually     Variants of migraine, not elsewhere classified, without mention of intractable migraine without mention of status migrainosus 05/22/2013   Past Surgical History:  Procedure Laterality Date   APPENDECTOMY     ARTERY BIOPSY Left 04/14/2018   Procedure: BIOPSY TEMPORAL ARTERY;  Surgeon: Harvey Carlin BRAVO, MD;  Location: [facility] OR;  Service: Vascular;  Laterality: Left;   BREAST EXCISIONAL BIOPSY Right 1998   CHOLECYSTECTOMY     COLONOSCOPY  2006   [facility]   INSERTION OF MESH N/A 04/05/2023   Procedure: INSERTION OF MESH;  Surgeon: Belinda Cough, MD;  Location: [facility] OR;  Service: General;  Laterality: N/A;   KNEE SURGERY Left    rotator cuff surgery     TONSILLECTOMY      UMBILICAL HERNIA REPAIR N/A 04/05/2023   Procedure: HERNIA REPAIR UMBILICAL ADULT WITH MESH;  Surgeon: Belinda Cough, MD;  Location: [facility] OR;  Service: General;  Laterality: N/A;   Social History   Socioeconomic History   Marital status: Married     Spouse name: Curlee   Number of children: 2   Years of education: [employer] education level: Bachelor's degree (e.g., BA, AB, BS)  Occupational History   Not on file  Tobacco Use   Smoking status: Never   Smokeless tobacco: Never  Vaping Use   Vaping status: Never Used  Substance and Sexual Activity   Alcohol use: Yes    Alcohol/week: 3.0 standard drinks of alcohol    Types: 3 Glasses of wine per week    Comment: 1/2 glass wine daily   Drug use: No   Sexual activity: Never  Other Topics Concern   Not on file  Social History Narrative   Patient is married [place]) and lives at home with her husband.   Patient has two adult children.   Patient is retired.   Patient has a college education.   Patient is right-handed.   Patient drinks one cup of tea daily.   Social Drivers of Health   Financial Resource Strain: Medium Risk (06/26/2024)   Overall Financial Resource Strain (CARDIA)    Difficulty of Paying Living Expenses: Somewhat hard  Food Insecurity: No Food Insecurity (06/26/2024)   Hunger Vital Sign    Worried About Running Out of Food in the Last Year: Never true    Ran Out of Food in the Last Year: Never true  Transportation Needs: No Transportation Needs (06/26/2024)   PRAPARE - Administrator, Civil Service (Medical): No    Lack of Transportation (Non-Medical): No  Physical Activity: Inactive (06/26/2024)   Exercise Vital Sign    Days of Exercise per Week: 0 days    Minutes of Exercise per Session: Not on file  Stress: Stress Concern Present (06/26/2024)   [employer] of Occupational Health - Occupational Stress Questionnaire    Feeling of Stress: Rather much  Social Connections: Socially Isolated (06/26/2024)   Social Connection and Isolation Panel    Frequency of Communication with Friends and Family: Never  Frequency of Social Gatherings with Friends and Family: Once a week    Attends Religious Services: Never    Database Administrator or  Organizations: Yes    Attends Banker Meetings: 1 to 4 times per year    Marital Status: Widowed  Intimate Partner Violence: Not At Risk (06/26/2024)   Humiliation, Afraid, Rape, and Kick questionnaire    Fear of Current or Ex-Partner: No    Emotionally Abused: No    Physically Abused: No    Sexually Abused: No   Current Outpatient Medications on File Prior to Visit  Medication Sig Dispense Refill   albuterol  (VENTOLIN  HFA) 108 (90 Base) MCG/ACT inhaler Inhale 2 puffs into the lungs every 6 (six) hours as needed for wheezing or shortness of breath. 18 g 6   budesonide -formoterol  (SYMBICORT ) 160-4.5 MCG/ACT inhaler Inhale 2 puffs into the lungs in the morning. 10.2 g 12   Calcium  Carb-Cholecalciferol (CALCIUM  600+D3 PO) Take 1 tablet by mouth in the morning.     carboxymethylcellulose (REFRESH TEARS) 0.5 % SOLN Place 1-2 drops into both eyes 3 (three) times daily as needed (dry/irritated eyes.).     cetirizine (ZYRTEC) 10 MG tablet Take 10 mg by mouth at bedtime.     Cholecalciferol (VITAMIN D) 50 MCG (2000 UT) CAPS Take 2,000 Units by mouth in the morning.     dapagliflozin  propanediol (FARXIGA ) 10 MG TABS tablet Take 1 tablet (10 mg total) by mouth daily before breakfast. 90 tablet 3   FLUoxetine  (PROZAC ) 20 MG capsule Take 1 capsule (20 mg total) by mouth daily. 90 capsule 3   furosemide  (LASIX ) 20 MG tablet Take 1-2 tablets (20-40 mg total) by mouth daily. Use as needed for lower extremity swelling 40 tablet 3   gabapentin  (NEURONTIN ) 100 MG capsule Take 1 capsule (100 mg total) by mouth 3 (three) times daily. 270 capsule 1   ibuprofen  (ADVIL ) 200 MG tablet Take 400-600 mg by mouth every 8 (eight) hours as needed (pain.).     lidocaine  (LIDO KING) 4 % Place 1 patch onto the skin daily as needed (pain.).     loratadine  (CLARITIN ) 10 MG tablet Take 10 mg by mouth in the morning.     Omega-3 Fatty Acids (FISH OIL) 1200 MG CAPS Take 1,200 mg by mouth in the morning.     Probiotic  Product (DIGESTIVE ADVANTAGE PO) Take 1 capsule by mouth in the morning.     rizatriptan  (MAXALT ) 10 MG tablet Take 1 tablet (10 mg total) by mouth as needed for migraine. May repeat in 2 hours if needed 10 tablet 0   rizatriptan  (MAXALT ) 10 MG tablet Take 1 tablet (10 mg total) by mouth as needed for migraine (can be repeated once after 2 hours.). May repeat in 2 hours if needed 36 tablet 1   rosuvastatin  (CRESTOR ) 10 MG tablet Take 1 tablet (10 mg total) by mouth daily. 90 tablet 3   simethicone (MYLICON) 125 MG chewable tablet Chew 125-250 mg by mouth at bedtime as needed for flatulence.     Sodium Chloride -Sodium Bicarb (AYR SALINE NASAL RINSE NA) Place into the nose every other day.     tirzepatide  (MOUNJARO ) 10 MG/0.5ML Pen Inject 10 mg into the skin once a week. 6 mL 2   No current facility-administered medications on file prior to visit.   Allergies  Allergen Reactions   Mushroom Ext Cmplx(Shiitake-Reishi-Mait) Anaphylaxis   Shellfish Allergy Anaphylaxis   Sulfonamide Derivatives Anaphylaxis   Vancomycin  Anaphylaxis  Fluticasone -Salmeterol Other (See Comments)    Feels like something in the throat   Butalbital-Aspirin-Caffeine Other (See Comments)    UNSPECIFIED REACTION    Keflex  [Cephalexin ]     Nausea/vomitting   Montelukast Sodium Other (See Comments)    Feels like she's running out of breath   Penicillins     UNSPECIFIED REACTION      Childhood allergy Has patient had a PCN reaction causing immediate rash, facial/tongue/throat swelling, SOB or lightheadedness with hypotension: Unknown Has patient had a PCN reaction causing severe rash involving mucus membranes or skin necrosis: Unknown Has patient had a PCN reaction that required hospitalization: No Has patient had a PCN reaction occurring within the last 10 years: No If all of the above answers are NO, then may proceed with Cephalosporin use.     Acetaminophen  Other (See Comments)    Stomach discomfort    Clarithromycin Nausea And Vomiting   Iodinated Contrast Media Other (See Comments)    Bad headache; no prep needed   Levofloxacin Diarrhea and Nausea And Vomiting    Dizziness Believes it was due to a high dosage of this medication   Orphenadrine Nausea And Vomiting   Family History  Problem Relation Age of Onset   Stomach cancer Father    Coronary artery disease Father    Cancer Father    Obesity Father    Breast cancer Sister 82       breast cancer   Cancer Sister    Diabetes Maternal Aunt    Diabetes Paternal Aunt    Prostate cancer Brother    Cancer Brother    Obesity Brother    Cancer Daughter    Diabetes Daughter    Obesity Daughter    Obesity Daughter    Stroke Neg Hx    PE: BP 110/78   Resp 18   Ht 5' (1.524 m)   Wt 256 lb (116.1 kg)   SpO2 98%   BMI 50.00 kg/m   Wt Readings from Last 3 Encounters:  10/09/24 256 lb (116.1 kg)  09/17/24 258 lb (117 kg)  07/12/24 258 lb 3.2 oz (117.1 kg)   Constitutional: overweight, in NAD Eyes:  EOMI, no exophthalmos ENT: no neck masses, no cervical lymphadenopathy Cardiovascular: RRR, No MRG Respiratory: CTA B Musculoskeletal: no deformities Skin: + Stasis dermatitis in bilateral legs Neurological: no tremor with outstretched hands  ASSESSMENT: 1. DM2, non-insulin -dependent, now more controlled, with complications - PN  2. HL  3.  Obesity class III  PLAN:  1. Patient with fairly well-controlled type 2 diabetes with multiple medication intolerances. - Her diabetic regimen is limited by previous side effects and preferences: Metformin IR, and ER - she was taking these inconsistently due to diarrhea and fecal incontinence low-dose glipizide  XL - she felt that this was also causing her GI symptoms and stopped it.  SGLT2 inhibitors were too expensive in the past.  I sent a new prescription for these to her pharmacy in 08/2020, but she was not able to start them due to price. Daughter called the insurance and SGLT2  inhibitors were not covered. She refused injectables including insulin  and GLP-1 receptor agonist.  However, she is now on the GLP-1/GIP receptor agonist. Actos is not ideal for her since this could cause more swelling. - She continues on Farxiga  with the patient assistance program as she tolerates this well.  This is being started her on Mounjaro , which we continued as she was tolerating it well except for some bloating  with meals but no nausea and vomiting.  At last visit, HbA1c was excellent, at 6.0% so we did not change her regimen.  She had better blood sugars later in the day but she still occasionally had slightly higher values in the morning, possibly due to late dinners.  We discussed about trying to take Mylanta in the day of Mounjaro  injection but help with GI symptoms, as she really wanted to continue with it.  She had another HbA1c 3 months ago and this was slightly higher but still at goal, at 6.3%. -Since last visit, PCP increased her Mounjaro  to 10 mg weekly.  She did not have significant side effects from this, but she still has occasional GI symptoms.  Mylanta helps. -Sugars appear to be almost entirely at goal.  No need to change her regimen for now. -Per her questioning, we discussed about stasis dermatitis and venous insufficiency.  Discussed about weight loss and exercise as the best ways to prevent worsening of her condition. - I suggested to:  Patient Instructions  Please continue: - Farxiga  10 mg before b'fast - Mounjaro  10 mg weekly  Please return in 4-6 months with your sugar log.   - we checked her HbA1c: 5.6% (lower) - advised to check sugars at different times of the day - 1x a day, rotating check times - advised for yearly eye exams >> she is UTD - return to clinic in 4-6 months  2.  HL - Latest lipid panel showed fractions at goal: Lab Results  Component Value Date   CHOL 143 04/27/2024   HDL 51 04/27/2024   LDLCALC 71 04/27/2024   LDLDIRECT 92.0 09/05/2020    TRIG 131 04/27/2024   CHOLHDL 2.8 04/27/2024  - She continues Crestor  10 mg daily and fish oil 1000 mg daily, tolerated well  3.  Obesity class III - She  continues on Farxiga  which should also help with weight loss. - She lost approximately 25 pounds before last visit, after stopping glipizide  and starting Mounjaro  (by PCP) - Weight was stable at last visit but she lost a net 8 pounds since then  Lela Fendt, MD PhD [facility] Endocrinology

## 2024-10-10 ENCOUNTER — Ambulatory Visit: Admitting: Clinical

## 2024-10-10 DIAGNOSIS — F4323 Adjustment disorder with mixed anxiety and depressed mood: Secondary | ICD-10-CM

## 2024-10-10 DIAGNOSIS — Z634 Disappearance and death of family member: Secondary | ICD-10-CM

## 2024-10-10 NOTE — Progress Notes (Signed)
 [facility] Counselor Progress Note - TELEMEDICINE VISIT  Patient ID: Nicole Mccann, MRN: [id number]    Date: 10/10/2024  Time Spent: 5:03pm  - 6pm  : 57 Minutes  Types of Service: Individual psychotherapy and Video visit  Client and/or Legal Guardian location: Client's home- [place], [place] Therapist location: [facility] - [place], [place] All persons participating in visit: Client & this therapist  I connected with client and/or legal guardian via Video Enabled Telemedicine Application  (Video is Caregility application) and verified that I am speaking with the correct person using two identifiers. Discussed confidentiality: Yes   I discussed the limitations of telemedicine and the availability of in person appointments.  Discussed there is a possibility of technology failure and discussed alternative modes of communication if that failure occurs.  I discussed that engaging in this telemedicine visit, they consent to the provision of behavioral healthcare and the services will be billed under their insurance.  Client and/or legal guardian expressed understanding and consented to Telemedicine visit: Yes    Presenting Concerns: - Ongoing stressors with older daughter - Ongoing grief from loss of husband  Mental Status Exam: Appearance:  Casual     Behavior: Appropriate  Motor: Normal  Speech/Language:  Normal Rate  Affect: Appropriate  Mood: sad  Thought process: normal  Thought content:   WNL  Sensory/Perceptual disturbances:   WNL  Orientation: oriented to person, place, time/date, situation, and day of week  Attention: Good  Concentration: Good  Memory: WNL  Fund of knowledge:  Good  Insight:   Good  Judgment:  Good  Impulse Control: Good   Risk Assessment: Danger to Self:  No Self-injurious Behavior: No Danger to Others: No Duty to Warn:no   Subjective:  Nicole Mccann reported ongoing stressors with her older daughter and missing her  husband.   Interventions: Narrative and Grief Therapy  Client Response: Nicole Mccann reported that she is trying not to worry about her older daughter's situation and will leave it up to her younger daughter.  Nicole Mccann started to share various stories about her husband and their experiences together.  Nicole Mccann stated that focusing on making things for others is helping her.  She makes ornaments for people and various hand made gifts.  She shared that growing up, Christmas gifts had to be hand made so she's continuing those traditions.   Diagnosis:  Adjustment disorder with mixed anxiety and depressed mood  Bereavement, uncomplicated   Goals, Assessment & Plan:   Nicole Mccann is adjusting to the loss of her husband with the support of her younger daughter and sharing stories about him that made her feel happy.   Treatment Level 2-3 times a month   Modality - TeleMedicine - Video   Goal:  Client will process and express grief-related emotions in a safe and supportive environment.  - Nicole Mccann shared her sadness losing her husband.  She also shared happy memories of him.   Target Date: 08/24/2025  Progress: Ongoing          Elease Swarm P. Trudy, MSW, LCSW [employer] Therapist Main Office: [phone]

## 2024-10-15 ENCOUNTER — Encounter: Payer: Self-pay | Admitting: Family Medicine

## 2024-10-24 ENCOUNTER — Ambulatory Visit: Admitting: Clinical

## 2024-10-28 ENCOUNTER — Other Ambulatory Visit

## 2024-10-28 ENCOUNTER — Inpatient Hospital Stay: Admission: RE | Admit: 2024-10-28 | Discharge: 2024-10-28 | Attending: Neurology | Admitting: Neurology

## 2024-10-28 DIAGNOSIS — E1142 Type 2 diabetes mellitus with diabetic polyneuropathy: Secondary | ICD-10-CM

## 2024-10-28 DIAGNOSIS — G4733 Obstructive sleep apnea (adult) (pediatric): Secondary | ICD-10-CM

## 2024-10-28 DIAGNOSIS — G40219 Localization-related (focal) (partial) symptomatic epilepsy and epileptic syndromes with complex partial seizures, intractable, without status epilepticus: Secondary | ICD-10-CM

## 2024-10-28 DIAGNOSIS — R519 Headache, unspecified: Secondary | ICD-10-CM | POA: Diagnosis not present

## 2024-10-28 DIAGNOSIS — G43801 Other migraine, not intractable, with status migrainosus: Secondary | ICD-10-CM

## 2024-10-28 MED ORDER — GADOPICLENOL 0.5 MMOL/ML IV SOLN
10.0000 mL | Freq: Once | INTRAVENOUS | Status: AC | PRN
  Administered 2024-10-28: 10 mL via INTRAVENOUS

## 2024-11-05 ENCOUNTER — Other Ambulatory Visit: Payer: Self-pay | Admitting: Family Medicine

## 2024-11-05 ENCOUNTER — Ambulatory Visit: Admitting: Clinical

## 2024-11-05 DIAGNOSIS — Z634 Disappearance and death of family member: Secondary | ICD-10-CM | POA: Diagnosis not present

## 2024-11-05 DIAGNOSIS — F4323 Adjustment disorder with mixed anxiety and depressed mood: Secondary | ICD-10-CM | POA: Diagnosis not present

## 2024-11-05 DIAGNOSIS — R600 Localized edema: Secondary | ICD-10-CM

## 2024-11-05 NOTE — Progress Notes (Unsigned)
 [facility] Counselor Progress Note - TELEMEDICINE VISIT  Patient ID: [name], MRN: [id number]    Date: 11/05/2024  Time Spent: 5pm-5:57pm 57 Minutes  Types of Service: Individual psychotherapy and Video visit  Client and/or Legal Guardian location: Client's home- [place], [place] Therapist location: [facility] [place] Office - [place], [place] All persons participating in visit: Client & this therapist  I connected with client and/or legal guardian via Video Enabled Telemedicine Application  (Video is Caregility application) and verified that I am speaking with the correct person using two identifiers. Discussed confidentiality: Yes   I discussed the limitations of telemedicine and the availability of in person appointments.  Discussed there is a possibility of technology failure and discussed alternative modes of communication if that failure occurs.  I discussed that engaging in this telemedicine visit, they consent to the provision of behavioral healthcare and the services will be billed under their insurance.  Client and/or legal guardian expressed understanding and consented to Telemedicine visit: Yes    Presenting Concerns: - Nicole Mccann grieving the loss of her husband  Mental Status Exam: Appearance:  Casual     Behavior: Appropriate  Motor: Normal  Speech/Language:  Normal Rate  Affect: Appropriate and Tearful  Mood: sad  Thought process: normal  Thought content:   WNL  Sensory/Perceptual disturbances:   WNL  Orientation: oriented to person, place, time/date, situation, and day of week  Attention: Good  Concentration: Good  Memory: WNL  Fund of knowledge:  Good  Insight:   Good  Judgment:  Good  Impulse Control: Good   Risk Assessment: Danger to Self:  No Self-injurious Behavior: No Danger to Others: No Duty to Warn:no   Subjective:  Nicole Mccann reported feeling more sad due to her husband not being present for the holiday  activities.  Interventions: Narrative and Grief Therapy  Client Response: Nicole Mccann shared her sadness with not having her husband with her during the holiday activities.  She shared how much she missed him and the things they use to do together.  Nicole Mccann also shared her thoughts & feelings regarding her relationship with her oldest daughter, that has been stressful for her for many years.  Nicole Mccann also reported that they are preparing her home to move to [place] next year with her youngest daughter. She is trying to prepare herself for the move since she has lived here for many years.  Diagnosis:  Adjustment disorder with mixed anxiety and depressed mood  Bereavement, uncomplicated   Goals, Assessment & Plan:    Treatment Level 2-3 times a month   Modality - TeleMedicine - Video   Goal:  Client will process and express grief-related emotions in a safe and supportive environment. - She shared she's been sad during this holiday season.  She's thinking about what they did last year for the holidays and how much she misses him. - Nicole Mccann may also be experiencing anticipatory grieving in preparation for the move to [place] next year with her youngest daughter.  Nicole Mccann spent many years in this city with her husband and has many things of his that she has to go through in their home.  Target Date: 08/24/2025  Progress: Ongoing       Nicole Mccann P. Trudy, MSW, LCSW [employer] Therapist Main Office: [phone]

## 2024-11-07 ENCOUNTER — Ambulatory Visit: Admitting: Clinical

## 2024-11-14 ENCOUNTER — Ambulatory Visit: Admitting: Clinical

## 2024-11-14 DIAGNOSIS — Z634 Disappearance and death of family member: Secondary | ICD-10-CM

## 2024-11-14 DIAGNOSIS — F4323 Adjustment disorder with mixed anxiety and depressed mood: Secondary | ICD-10-CM

## 2024-11-14 NOTE — Progress Notes (Signed)
 [facility] Counselor Progress Note - TELEMEDICINE VISIT  Patient ID: OLUWATOSIN BRACY, MRN: [id number]    Date: 11/14/24  Time Spent: 5:03pm   - 5:08  : 5 Minutes  No charge for this visit due to brief amount of time. (Patient was coughing a lot and not feeling well so would be difficult to have the visit)  Ms. Yarberry reported she would rather not have the visit.  Scheduled another visit for 11/28/24 and confirmed visit for 11/21/24.   Malesha Suliman P. Trudy, MSW, LCSW [employer] Therapist Main Office: [phone]

## 2024-11-21 ENCOUNTER — Ambulatory Visit: Admitting: Clinical

## 2024-11-21 DIAGNOSIS — F4323 Adjustment disorder with mixed anxiety and depressed mood: Secondary | ICD-10-CM | POA: Diagnosis not present

## 2024-11-21 DIAGNOSIS — Z634 Disappearance and death of family member: Secondary | ICD-10-CM

## 2024-11-21 NOTE — Progress Notes (Signed)
 [facility] Counselor Progress Note - TELEMEDICINE VISIT  Patient ID: Nicole Mccann, MRN: [id number]    Date: 11/21/24  Time Spent: 5:02pm   - 6pm  : 58 Minutes  Types of Service: Individual psychotherapy and Video visit  Client and/or Legal Guardian location: Client's home-[place], [place] Therapist location: [facility] [place], [place]  All persons participating in visit: Client & this therapist  I connected with client and/or legal guardian via Video Enabled Telemedicine Application  (Video is Caregility application) and verified that I am speaking with the correct person using two identifiers. Discussed confidentiality: Yes   I discussed the limitations of telemedicine and the availability of in person appointments.  Discussed there is a possibility of technology failure and discussed alternative modes of communication if that failure occurs.  I discussed that engaging in this telemedicine visit, they consent to the provision of behavioral healthcare and the services will be billed under their insurance.  Client and/or legal guardian expressed understanding and consented to Telemedicine visit: Yes    Presenting Concerns:  -Feels sad and overwhelmed  Mental Status Exam: Appearance:  Casual     Behavior: Appropriate  Motor: Restless  Speech/Language:  Normal Rate  Affect: Congruent  Mood: anxious and sad  Thought process: normal  Thought content:   WNL  Sensory/Perceptual disturbances:   WNL  Orientation: oriented to person, place, time/date, situation, and day of week  Attention: Good  Concentration: Fair  Memory: WNL  Fund of knowledge:  Good  Insight:   Good  Judgment:  Good  Impulse Control: Good   Risk Assessment: Danger to Self:  No Self-injurious Behavior: No Danger to Others: No Duty to Warn:no   Subjective:  Nicole Mccann reported she was not feeling well due to different things.   Interventions: Narrative and Grief  Therapy  Client Response: Nicole Mccann reported that on 12/29/24 is her husband's death anniversary. They were together for 61 years.  She reported that her and her daughter have different ideas of how to clean out the house in order to prepare to sell it.   Diagnosis:  Adjustment disorder with mixed anxiety and depressed mood  Bereavement, uncomplicated   Goals, Assessment & Plan:   Treatment Level 2-3 times a month   Modality - TeleMedicine - Video   Goal:  Client will process and express grief-related emotions in a safe and supportive environment.  - Nicole Mccann continues to adjust to the loss of her husband.  She reported that she feels worse now than she did a few months ago.  She acknowledged that she has been feeling various emotions that she wasn't able to feel when he first died.  She was open to the information that people are in varying stages after a loved one dies.  - Nicole Mccann was able to verbalize the emotions connected with letting go of the things/objects in their home that they collected throughout their years together.  Therefore, letting go of the things represented ongoing loss of her husband.  - Nicole Mccann was able to share more memories that she had with her husband during the visit. - Nicole Mccann stated she felt better being able to talk about her thoughts & emotions.    Target Date: 08/24/2025  Progress: Ongoing       Caryssa Elzey P. Trudy, MSW, LCSW [employer] Therapist Main Office: [phone]

## 2024-11-28 ENCOUNTER — Ambulatory Visit: Admitting: Clinical

## 2024-11-28 DIAGNOSIS — F4323 Adjustment disorder with mixed anxiety and depressed mood: Secondary | ICD-10-CM

## 2024-11-28 DIAGNOSIS — Z634 Disappearance and death of family member: Secondary | ICD-10-CM

## 2024-11-28 NOTE — Progress Notes (Signed)
 [facility] Counselor Progress Note - TELEMEDICINE VISIT  Patient ID: Nicole Mccann, MRN: [id number]    Date: 11/28/24  Time Spent: 5:02pm   - 6:05pm  : 63 Minutes  Types of Service: Individual psychotherapy and Video visit  Client and/or Legal Guardian location: Client's home-[place], [place] Therapist location: [facility] - [place], [place] All persons participating in visit: Client & this therapist  I connected with client and/or legal guardian via Video Enabled Telemedicine Application  (Video is Caregility application) and verified that I am speaking with the correct person using two identifiers. Discussed confidentiality: Yes   I discussed the limitations of telemedicine and the availability of in person appointments.  Discussed there is a possibility of technology failure and discussed alternative modes of communication if that failure occurs.  I discussed that engaging in this telemedicine visit, they consent to the provision of behavioral healthcare and the services will be billed under their insurance.  Client and/or legal guardian expressed understanding and consented to Telemedicine visit: Yes    Presenting Concerns:  - Increased difficulties adjusting to the loss of her husband  Mental Status Exam: Appearance:  Casual     Behavior: Appropriate  Motor: Normal  Speech/Language:  Normal Rate  Affect: Congruent and Tearful  Mood: sad  Thought process: normal  Thought content:   WNL  Sensory/Perceptual disturbances:   WNL  Orientation: oriented to person, place, time/date, situation, and day of week  Attention: Good  Concentration: Good  Memory: WNL  Fund of knowledge:  Good  Insight:   Good  Judgment:  Good  Impulse Control: Good   Risk Assessment: Danger to Self:  No Self-injurious Behavior: No Danger to Others: No Duty to Warn:no   Subjective:  [name] reported she having a more difficult time as she continues to think about  the loss of her husband, especially the last few weeks of his life when he was on hospice at home.   Interventions: Narrative and Grief Therapy  Client Response: [name] appeared sad and tearful at the beginning of the visit. She began to share more of her experiences during the last few weeks of his life. [name] reported that her husband was athletic, eg riding bikes, playing soccer, etc. And when he started becoming ill, it was a difficult adjustment for him to not do things that he use to do.   She shared that her husband died at home on hospice and how difficult it was for her to take care of him at home.  [name] reported she wasn't ready to say good-bye to him and it was her youngest daughter that found him after he died.  She shared her memory of that day.  She also reported conflicted feelings regarding her oldest daughter not having the opportunity to see her father before he died.  [name] shared she asked her husband if he wanted to see their oldest daughter and he declined. [name] shared if his response would have been different if it wasn't for the strained relationship she had with her daughter.  Throughout the visit, [name] shared more memories of her husband and her life as an wellsite geologist.  [name] began to smile and laugh at times when sharing her stories.  Diagnosis:  Adjustment disorder with mixed anxiety and depressed mood  Bereavement, uncomplicated   Goals, Assessment & Plan:   Treatment Level 2-3 times a month   Modality - TeleMedicine - Video  Goal:  Client will process and express grief-related emotions in a safe and supportive environment.   - [name] reported feelings of sadness and shared conversations she had with her husband about what would happen if either of them died. - [name] reported conflicted feelings at the end of his life.    Target Date: 08/24/2025  Progress: Ongoing     Madox Corkins P. Trudy, MSW, LCSW [employer]  Therapist Main Office: [phone]

## 2024-12-04 ENCOUNTER — Ambulatory Visit: Admitting: Neurology

## 2024-12-13 ENCOUNTER — Ambulatory Visit: Admitting: Clinical

## 2024-12-13 DIAGNOSIS — F4323 Adjustment disorder with mixed anxiety and depressed mood: Secondary | ICD-10-CM

## 2024-12-13 DIAGNOSIS — Z634 Disappearance and death of family member: Secondary | ICD-10-CM

## 2024-12-13 NOTE — Progress Notes (Unsigned)
 [facility] Counselor Progress Note - TELEMEDICINE VISIT  Patient ID: Nicole Mccann, MRN: [id number]    Date: 12/13/2024  Time Spent: 5pm   - 5:25pm  : 25 Minutes  Types of Service: Individual psychotherapy and Video visit  Client and/or Legal Guardian location: Client's home-[place], [place] Therapist location: [facility] - [place], [place] All persons participating in visit: Client & this therapist  I connected with client and/or legal guardian via Video Enabled Telemedicine Application  (Video is Caregility application) and verified that I am speaking with the correct person using two identifiers. Discussed confidentiality: Yes   I discussed the limitations of telemedicine and the availability of in person appointments.  Discussed there is a possibility of technology failure and discussed alternative modes of communication if that failure occurs.  I discussed that engaging in this telemedicine visit, they consent to the provision of behavioral healthcare and the services will be billed under their insurance.  Client and/or legal guardian expressed understanding and consented to Telemedicine visit: Yes    Presenting Concerns: ***  Mental Status Exam: Appearance:  {PSY:22683}     Behavior: {PSY:21022743}  Motor: {PSY:22302}  Speech/Language:  {PSY:22685}  Affect: {PSY:22687}  Mood: {PSY:31886}  Thought process: {PSY:31888}  Thought content:   {[phone]}  Sensory/Perceptual disturbances:   Pain from molars being taken out recently  Orientation: {PSY:30297}  Attention: {PSY:22877}  Concentration: {PSY:484-484-6676}  Memory: {[phone]}  Fund of knowledge:  {PSY:484-484-6676}  Insight:   {PSY:484-484-6676}  Judgment:  {PSY:484-484-6676}  Impulse Control: {PSY:484-484-6676}   Risk Assessment: Danger to Self:  {PSY:22692} Self-injurious Behavior: {PSY:22692} Danger to Others: {PSY:22692} Duty to Warn:{PSY:311194}   Subjective:  ***    Interventions: {PSY:[phone]}  Client Response: ***   Diagnosis:  Adjustment disorder with mixed anxiety and depressed mood  Bereavement, uncomplicated   Goals, Assessment & Plan:   ***  Treatment Level {Frequency of sessions.:26745}  Modality - TeleMedicine - Video  Goal:  ***  Target Date: ***  Progress: ***     Goals: ***  Target Date: ***  Progress: ***    Sheffield Hawker P. Trudy, MSW, LCSW [employer] Therapist Main Office: [phone]

## 2024-12-25 ENCOUNTER — Ambulatory Visit: Admitting: Neurology

## 2024-12-27 ENCOUNTER — Ambulatory Visit: Admitting: Clinical

## 2025-01-10 ENCOUNTER — Ambulatory Visit: Admitting: Clinical

## 2025-01-24 ENCOUNTER — Ambulatory Visit: Admitting: Clinical

## 2025-02-07 ENCOUNTER — Ambulatory Visit: Admitting: Clinical

## 2025-02-21 ENCOUNTER — Ambulatory Visit: Admitting: Clinical

## 2025-03-07 ENCOUNTER — Ambulatory Visit: Admitting: Clinical

## 2025-04-09 ENCOUNTER — Ambulatory Visit: Admitting: Internal Medicine

## 2025-06-27 ENCOUNTER — Telehealth: Admitting: Adult Health

## 2025-07-02 ENCOUNTER — Ambulatory Visit
